# Patient Record
Sex: Male | Born: 1955 | Race: White | Hispanic: No | Smoking: Former smoker
Health system: Southern US, Community
[De-identification: ages and names within clinical notes are randomized; demographics above are authoritative.]

## PROBLEM LIST (undated history)

## (undated) DIAGNOSIS — J449 Chronic obstructive pulmonary disease, unspecified: Secondary | ICD-10-CM

## (undated) DIAGNOSIS — I10 Essential (primary) hypertension: Secondary | ICD-10-CM

## (undated) DIAGNOSIS — E119 Type 2 diabetes mellitus without complications: Secondary | ICD-10-CM

## (undated) DIAGNOSIS — M1990 Unspecified osteoarthritis, unspecified site: Secondary | ICD-10-CM

## (undated) DIAGNOSIS — C801 Malignant (primary) neoplasm, unspecified: Secondary | ICD-10-CM

## (undated) DIAGNOSIS — M21371 Foot drop, right foot: Secondary | ICD-10-CM

## (undated) HISTORY — PX: HERNIA REPAIR: SHX51

## (undated) HISTORY — PX: OTHER SURGICAL HISTORY: SHX169

## (undated) HISTORY — PX: LUNG REMOVAL, PARTIAL: SHX233

## (undated) HISTORY — DX: Chronic obstructive pulmonary disease, unspecified: J44.9

## (undated) HISTORY — PX: EYE SURGERY: SHX253

## (undated) HISTORY — DX: Essential (primary) hypertension: I10

## (undated) HISTORY — PX: CATARACT EXTRACTION: SUR2

---

## 2003-04-21 ENCOUNTER — Ambulatory Visit (HOSPITAL_COMMUNITY): Admission: RE | Admit: 2003-04-21 | Discharge: 2003-04-22 | Payer: Self-pay | Admitting: Neurosurgery

## 2009-10-10 HISTORY — PX: COLONOSCOPY: SHX174

## 2009-10-15 ENCOUNTER — Encounter: Payer: Self-pay | Admitting: Gastroenterology

## 2009-10-18 ENCOUNTER — Ambulatory Visit: Payer: Self-pay | Admitting: Gastroenterology

## 2009-10-18 ENCOUNTER — Ambulatory Visit (HOSPITAL_COMMUNITY): Admission: RE | Admit: 2009-10-18 | Discharge: 2009-10-18 | Payer: Self-pay | Admitting: Gastroenterology

## 2009-10-20 ENCOUNTER — Encounter (INDEPENDENT_AMBULATORY_CARE_PROVIDER_SITE_OTHER): Payer: Self-pay

## 2009-11-03 ENCOUNTER — Observation Stay (HOSPITAL_COMMUNITY): Admission: RE | Admit: 2009-11-03 | Discharge: 2009-11-04 | Payer: Self-pay | Admitting: General Surgery

## 2009-11-03 ENCOUNTER — Encounter (INDEPENDENT_AMBULATORY_CARE_PROVIDER_SITE_OTHER): Payer: Self-pay | Admitting: General Surgery

## 2010-03-09 ENCOUNTER — Emergency Department (HOSPITAL_COMMUNITY): Admission: EM | Admit: 2010-03-09 | Discharge: 2010-03-09 | Payer: Self-pay | Admitting: Emergency Medicine

## 2010-07-12 NOTE — Letter (Signed)
Summary: Patient Notice, Colon Biopsy Results  [facility]  [address]   [place], [place] [address]   Phone: [phone]  Fax: [phone]       Oct 20, 2009   JEFFREY Coote [address] [place], [place]  [address] 1955-10-29    Dear Mr. Kille,  I am pleased to inform you that the biopsies taken during your recent colonoscopy did not show any evidence of cancer upon pathologic examination.  Additional information/recommendations:  __ Please follow a high fiber diet  __You should have a repeat colonoscopy examination  in 10 years.  Please call us if you are having persistent problems or have questions about your condition that have not been fully answered at this time.  Sincerely,    Hendricks Limes LPN  [facility] Ph: [phone]    Fax: [phone]   Appended Document: Patient Notice, Colon Biopsy Results Pt called and was informed of his results.

## 2010-07-12 NOTE — Letter (Signed)
Summary: TCS ORDER  TCS ORDER   Imported By: Ave Filter 10/15/2009 10:33:12  _____________________________________________________________________  External Attachment:    Type:   Image     Comment:   External Document

## 2010-08-29 LAB — BASIC METABOLIC PANEL
BUN: 11 mg/dL (ref 6–23)
CO2: 24 mEq/L (ref 19–32)
Calcium: 9.3 mg/dL (ref 8.4–10.5)
Chloride: 102 mEq/L (ref 96–112)
Creatinine, Ser: 0.71 mg/dL (ref 0.4–1.5)
GFR calc Af Amer: >60 mL/min (ref >60)
GFR calc non Af Amer: >60 mL/min (ref >60)
Glucose, Bld: 86 mg/dL (ref 70–99)
Potassium: 4.3 mEq/L (ref 3.5–5.1)
Sodium: 135 mEq/L (ref 135–145)

## 2010-08-29 LAB — DIFFERENTIAL
Basophils Absolute: 0 10*3/uL (ref 0.0–0.1)
Basophils Relative: 0 % (ref 0–1)
Eosinophils Absolute: 0.3 10*3/uL (ref 0.0–0.7)
Eosinophils Relative: 3 % (ref 0–5)
Lymphocytes Relative: 38 % (ref 12–46)
Lymphs Abs: 4.1 10*3/uL — ABNORMAL HIGH (ref 0.7–4.0)
Monocytes Absolute: 0.6 10*3/uL (ref 0.1–1.0)
Monocytes Relative: 6 % (ref 3–12)
Neutro Abs: 5.7 10*3/uL (ref 1.7–7.7)
Neutrophils Relative %: 53 % (ref 43–77)

## 2010-08-29 LAB — CBC
HCT: 43 % (ref 39.0–52.0)
Hemoglobin: 14.9 g/dL (ref 13.0–17.0)
MCHC: 34.5 g/dL (ref 30.0–36.0)
MCV: 85.9 fL (ref 78.0–100.0)
Platelets: 248 10*3/uL (ref 150–400)
RBC: 5.01 MIL/uL (ref 4.22–5.81)
RDW: 14.4 % (ref 11.5–15.5)
WBC: 10.7 10*3/uL — ABNORMAL HIGH (ref 4.0–10.5)

## 2010-08-29 LAB — MRSA PCR SCREENING: MRSA by PCR: NEGATIVE

## 2010-10-28 NOTE — Op Note (Signed)
Stephen Diaz, Stephen Diaz                       ACCOUNT NO.:  [id number]   MEDICAL RECORD NO.:  [id number]                   PATIENT TYPE:  OIB   LOCATION:  2875                                 FACILITY:  [facility]   PHYSICIAN:  Hilda Lias, M.D.                DATE OF BIRTH:  Apr 18, 1956   DATE OF PROCEDURE:  04/21/2003  DATE OF DISCHARGE:                                 OPERATIVE REPORT   PREOPERATIVE DIAGNOSIS:  1. 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  2. Right carpal tunnel syndrome.   POSTOPERATIVE DIAGNOSIS:  1. 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  2. Right carpal tunnel syndrome.   OPERATION PERFORMED:  Anterior 5-6 diskectomy, removal of seven large free  fragments going to the right side, decompression of the spinal cord,  bilateral foraminotomy, interbody fusion plate.  Microscope.   SURGEON:  Hilda Lias, M.D.   ASSISTANT:  Clydene Fake, M.D.   ANESTHESIA:  General.   INDICATIONS FOR PROCEDURE:  The patient was admitted because of neck and  right upper extremity pain.  We found that he has a large herniated disk  central to the right of the level 5-6 as well as carpal tunnel syndrome.  Surgery was advised.  The risks were explained in the history and physical.   DESCRIPTION OF PROCEDURE:  The patient was taken to the operating room.  After intubation the neck was prepped with Betadine.  A transverse incision  was made through the skin and platysma down to the cervical spine.  X-ray  showed that indeed, we were at the level of 5-6.  With the microscope, we  opened the anterior ligament and we then performed gross diskectomy.  There  was an opening in the posterior ligament.  The posterior ligament was  incised and there were between seven to eight free fragments going to the  right side.  Decompression of the C6 nerve root was achieved. The nerve was  swollen and reddish.  Then we went  laterally to decompress the spinal cord  as well as the opposite nerve root.  Having good decompression, the end  plate was drilled and a piece of bone graft, allograft, 8 mm height was  inserted.  In the middle of the bone graft we put DBX.  Then the area was  irrigated.  A plate using four screws was done.  Lateral  C-spine showed  good position of the plate as well as the screws.  Investigation of the area  was negative.  Then hemostasis was down with bipolar and the wound was  closed with Vicryl and Steri-Strips.                                               Hilda Lias,  M.D.    EB/MEDQ  D:  04/21/2003  T:  04/21/2003  Job:  [id number]

## 2010-10-28 NOTE — H&P (Signed)
NAME:  Stephen Diaz, Stephen Diaz                       ACCOUNT NO.:  [id number]   MEDICAL RECORD NO.:  [id number]                   PATIENT TYPE:  OIB   LOCATION:  3011                                 FACILITY:  [facility]   PHYSICIAN:  Hilda Lias, M.D.                DATE OF BIRTH:  [birth year]   DATE OF ADMISSION:  04/21/2003  DATE OF DISCHARGE:                                HISTORY & PHYSICAL   HISTORY OF PRESENT ILLNESS:  Stephen Diaz is Diaz gentleman who came to see me  in my office several days ago because of sudden onset of neck pain with  radiation to the right shoulder associated with weakness and tingling  sensation downgoing to the right hand.  The patient has been unable to work,  he cannot sleep, and he is quite miserable.  He had MRI and sent to [place] for  evaluation.  He denies any problem with the left arm.  In the past he has  had some back pain with some spondylosis at the level of L3-L4 in the right  side.  Because of worsening of the clinical situation he wants to proceed  with surgery as soon as possible.   PAST MEDICAL HISTORY:  History of lumbar disk disease also at the level of  L1-2, 2-3, and 3-4.  Other levels unremarkable.   SOCIAL HISTORY:  The patient does not drink; he smokes daily.   PHYSICAL EXAMINATION:  GENERAL:  The patient came to my office with his wife  and he has quite Diaz bit of distress in the right upper extremity.  He has Diaz  tendency to put the hand on top of the head to relieve the pain.  HEENT:  Normal.  NECK:  He is able to flex but extension and lateralization produces  discomfort.  LUNGS:  There are some bilateral rhonchi.  CARDIOVASCULAR:  Normal.  ABDOMEN:  Normal.  EXTREMITIES:  Normal pulses.  NEUROLOGIC:  Mental status normal.  Cranial nerves normal.  Strength 5/5  except in the right arm where I can break easily the right biceps and right  wrist extensor.  He also has Diaz Tinel's sign positive right side.  Coordination and gait normal.   Reflexes 2+ with decrease of the right  biceps.   LABORATORY DATA:  The MRI showed that he has Diaz herniated disk at the level 5-  6 central and to the right.   CLINICAL IMPRESSION:  1. C5-6 herniated disk.  2. Carpal tunnel syndrome, right.   PLAN:  The patient is being admitted for anterior cervical discectomy.  The  procedure will be to decompress the spinal cord and C6 nerve root, put Diaz  bone graft in between, and Diaz plate.  He knows about the risks such as  infection, CSF leak, worsening pain, paralysis, need of further surgery,  damage to the vocal cord, damage to the esophagus and vertebral artery, need  to replace the plate.                                               Hilda Lias, M.D.   EB/MEDQ  D:  04/21/2003  T:  04/21/2003  Job:  [id number]

## 2010-10-31 ENCOUNTER — Encounter: Payer: Self-pay | Admitting: Nurse Practitioner

## 2010-12-28 ENCOUNTER — Ambulatory Visit (HOSPITAL_COMMUNITY): Payer: Managed Care, Other (non HMO)

## 2010-12-28 ENCOUNTER — Ambulatory Visit (HOSPITAL_COMMUNITY)
Admission: RE | Admit: 2010-12-28 | Discharge: 2010-12-29 | Disposition: A | Discharge status: Home / Self Care | Payer: Managed Care, Other (non HMO) | Source: Ambulatory Visit | Attending: Orthopedic Surgery | Admitting: Orthopedic Surgery

## 2010-12-28 DIAGNOSIS — J4489 Other specified chronic obstructive pulmonary disease: Secondary | ICD-10-CM | POA: Insufficient documentation

## 2010-12-28 DIAGNOSIS — F17200 Nicotine dependence, unspecified, uncomplicated: Secondary | ICD-10-CM | POA: Insufficient documentation

## 2010-12-28 DIAGNOSIS — Z01810 Encounter for preprocedural cardiovascular examination: Secondary | ICD-10-CM | POA: Insufficient documentation

## 2010-12-28 DIAGNOSIS — Z01818 Encounter for other preprocedural examination: Secondary | ICD-10-CM | POA: Insufficient documentation

## 2010-12-28 DIAGNOSIS — M47812 Spondylosis without myelopathy or radiculopathy, cervical region: Secondary | ICD-10-CM | POA: Insufficient documentation

## 2010-12-28 DIAGNOSIS — J449 Chronic obstructive pulmonary disease, unspecified: Secondary | ICD-10-CM | POA: Insufficient documentation

## 2010-12-28 DIAGNOSIS — Z472 Encounter for removal of internal fixation device: Secondary | ICD-10-CM | POA: Insufficient documentation

## 2010-12-28 DIAGNOSIS — I10 Essential (primary) hypertension: Secondary | ICD-10-CM | POA: Insufficient documentation

## 2010-12-28 DIAGNOSIS — Z01812 Encounter for preprocedural laboratory examination: Secondary | ICD-10-CM | POA: Insufficient documentation

## 2010-12-28 LAB — BASIC METABOLIC PANEL
BUN: 9 mg/dL (ref 6–23)
CO2: 26 mEq/L (ref 19–32)
Calcium: 9 mg/dL (ref 8.4–10.5)
Chloride: 107 mEq/L (ref 96–112)
Creatinine, Ser: 0.73 mg/dL (ref 0.50–1.35)
GFR calc Af Amer: >60 mL/min (ref >60)
GFR calc non Af Amer: >60 mL/min (ref >60)
Glucose, Bld: 97 mg/dL (ref 70–99)
Potassium: 4.7 mEq/L (ref 3.5–5.1)
Sodium: 141 mEq/L (ref 135–145)

## 2010-12-28 LAB — CBC
HCT: 42.6 % (ref 39.0–52.0)
Hemoglobin: 14.8 g/dL (ref 13.0–17.0)
MCH: 29.4 pg (ref 26.0–34.0)
MCHC: 34.7 g/dL (ref 30.0–36.0)
MCV: 84.7 fL (ref 78.0–100.0)
Platelets: 244 10*3/uL (ref 150–400)
RBC: 5.03 MIL/uL (ref 4.22–5.81)
RDW: 13.8 % (ref 11.5–15.5)
WBC: 8.3 10*3/uL (ref 4.0–10.5)

## 2010-12-28 LAB — SURGICAL PCR SCREEN
MRSA, PCR: NEGATIVE
Staphylococcus aureus: NEGATIVE

## 2010-12-28 IMAGING — RF DG CERVICAL SPINE 2 OR 3 VIEWS
1 series · 2 of 2 positions shown · non-contrast
Comparison: [DATE]

CLINICAL DATA: Neck pain

CERVICAL SPINE - 2-3 VIEW

[Series 1: run · 2 of 2 slices shown]
[im 1/2]
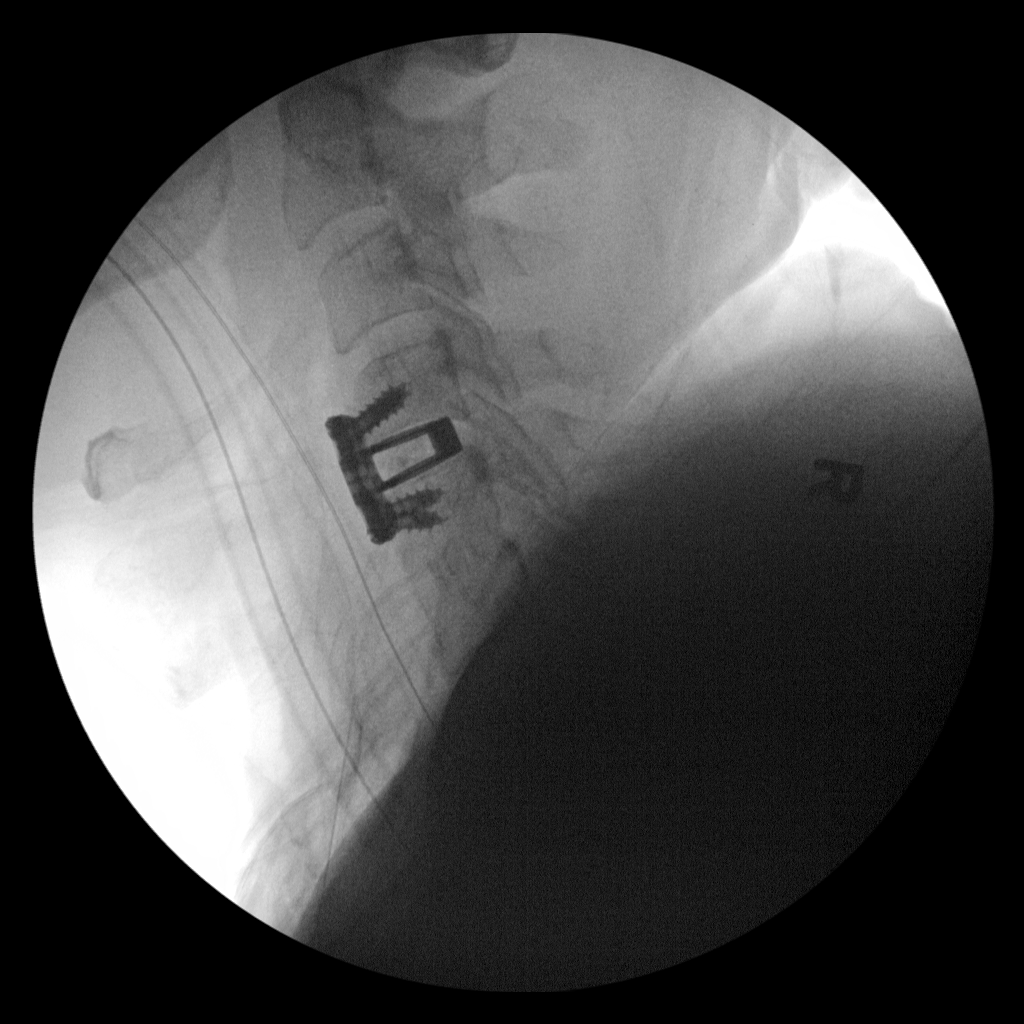
[im 2/2]
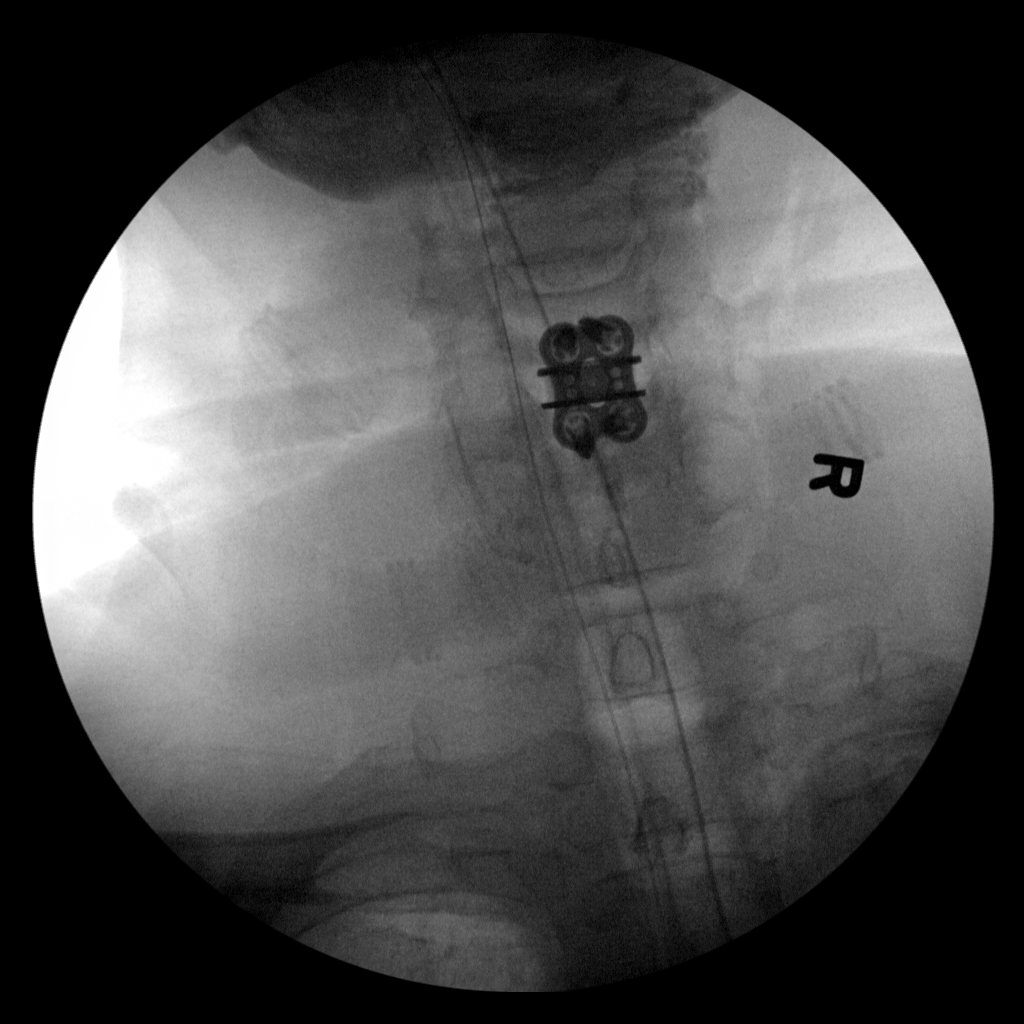

[2 of 2 positions shown; findings below may reference images not displayed]

FINDINGS: Hardware at C5-6 has been removed.  There is a new ACDF
at C4-C5.  Satisfactory position and alignment, with good
restoration of intervertebral disc space height.
IMPRESSION: Satisfactory appearance status post C4-C5 ACDF with removal of C5-
C6 hardware.

## 2010-12-28 IMAGING — CR DG CERVICAL SPINE 2 OR 3 VIEWS
3 series · 3 of 3 positions shown · non-contrast
Comparison: None.

CLINICAL DATA: Preop ACDF

CERVICAL SPINE - 2-3 VIEW

[view not recorded (1 of 3)]
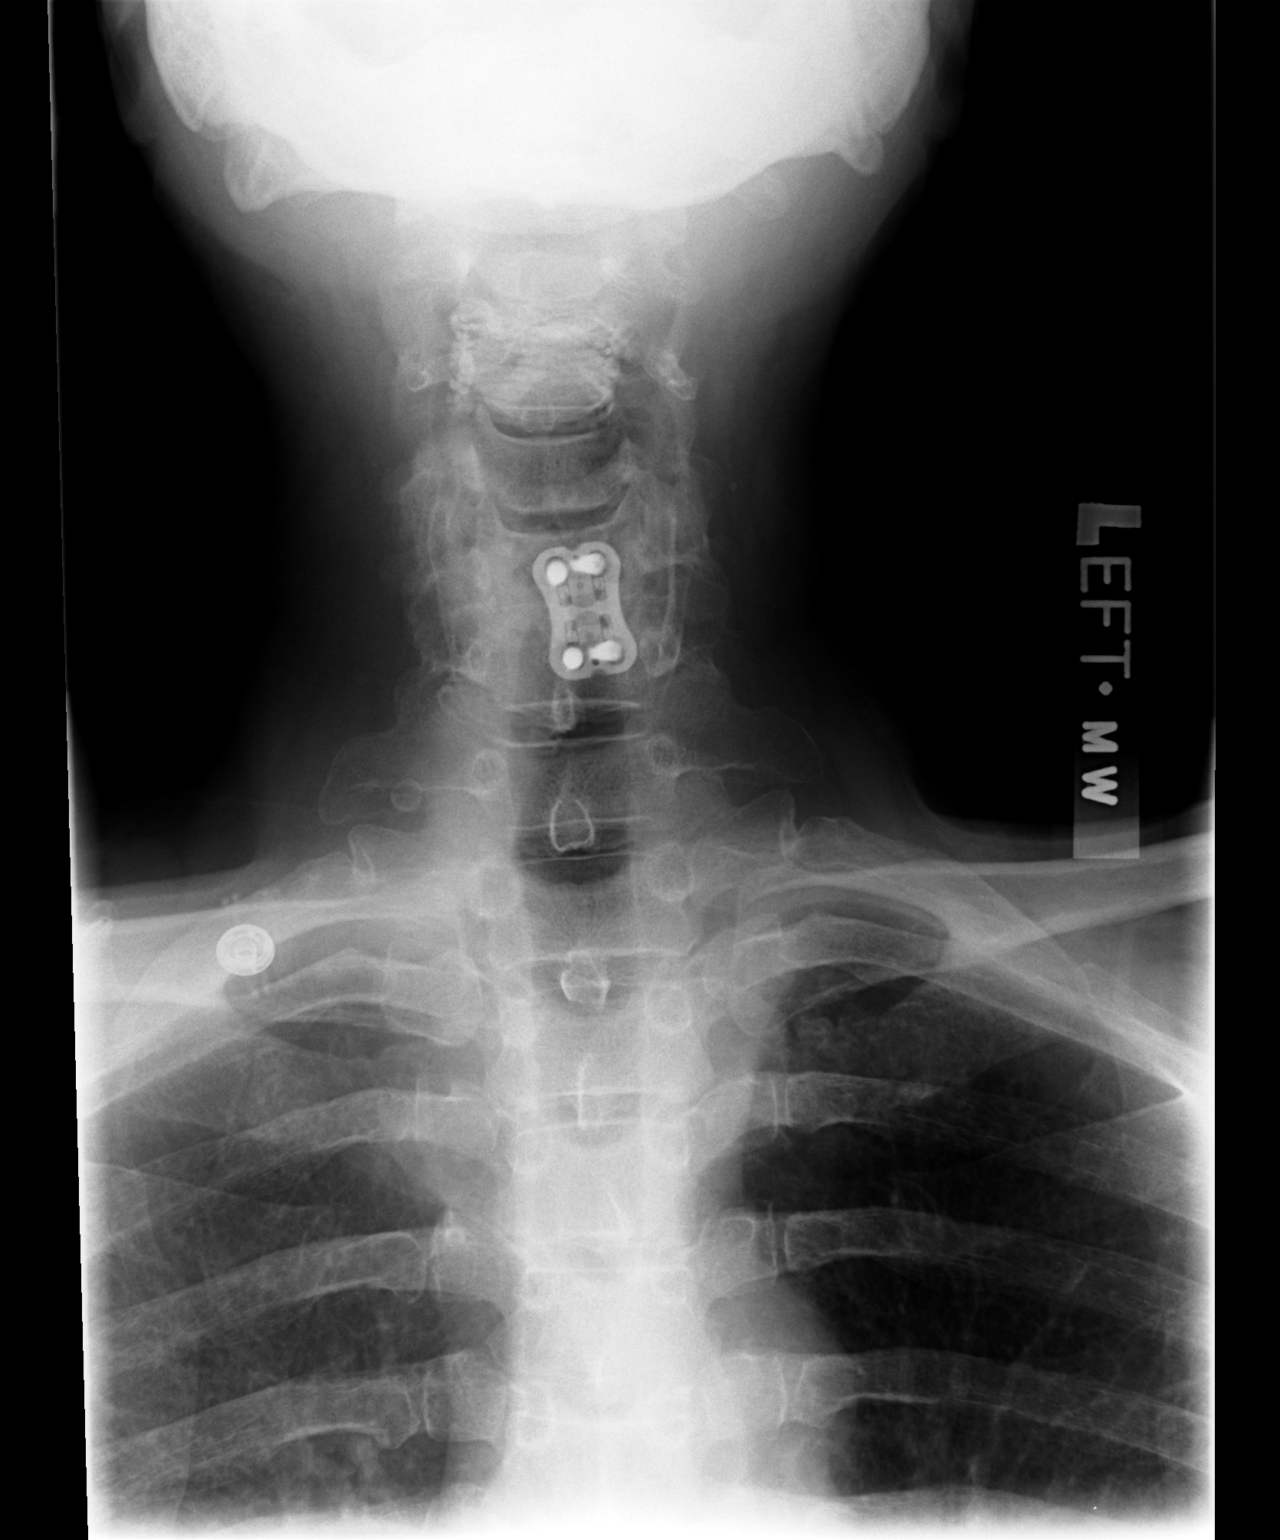

[view not recorded (2 of 3)]
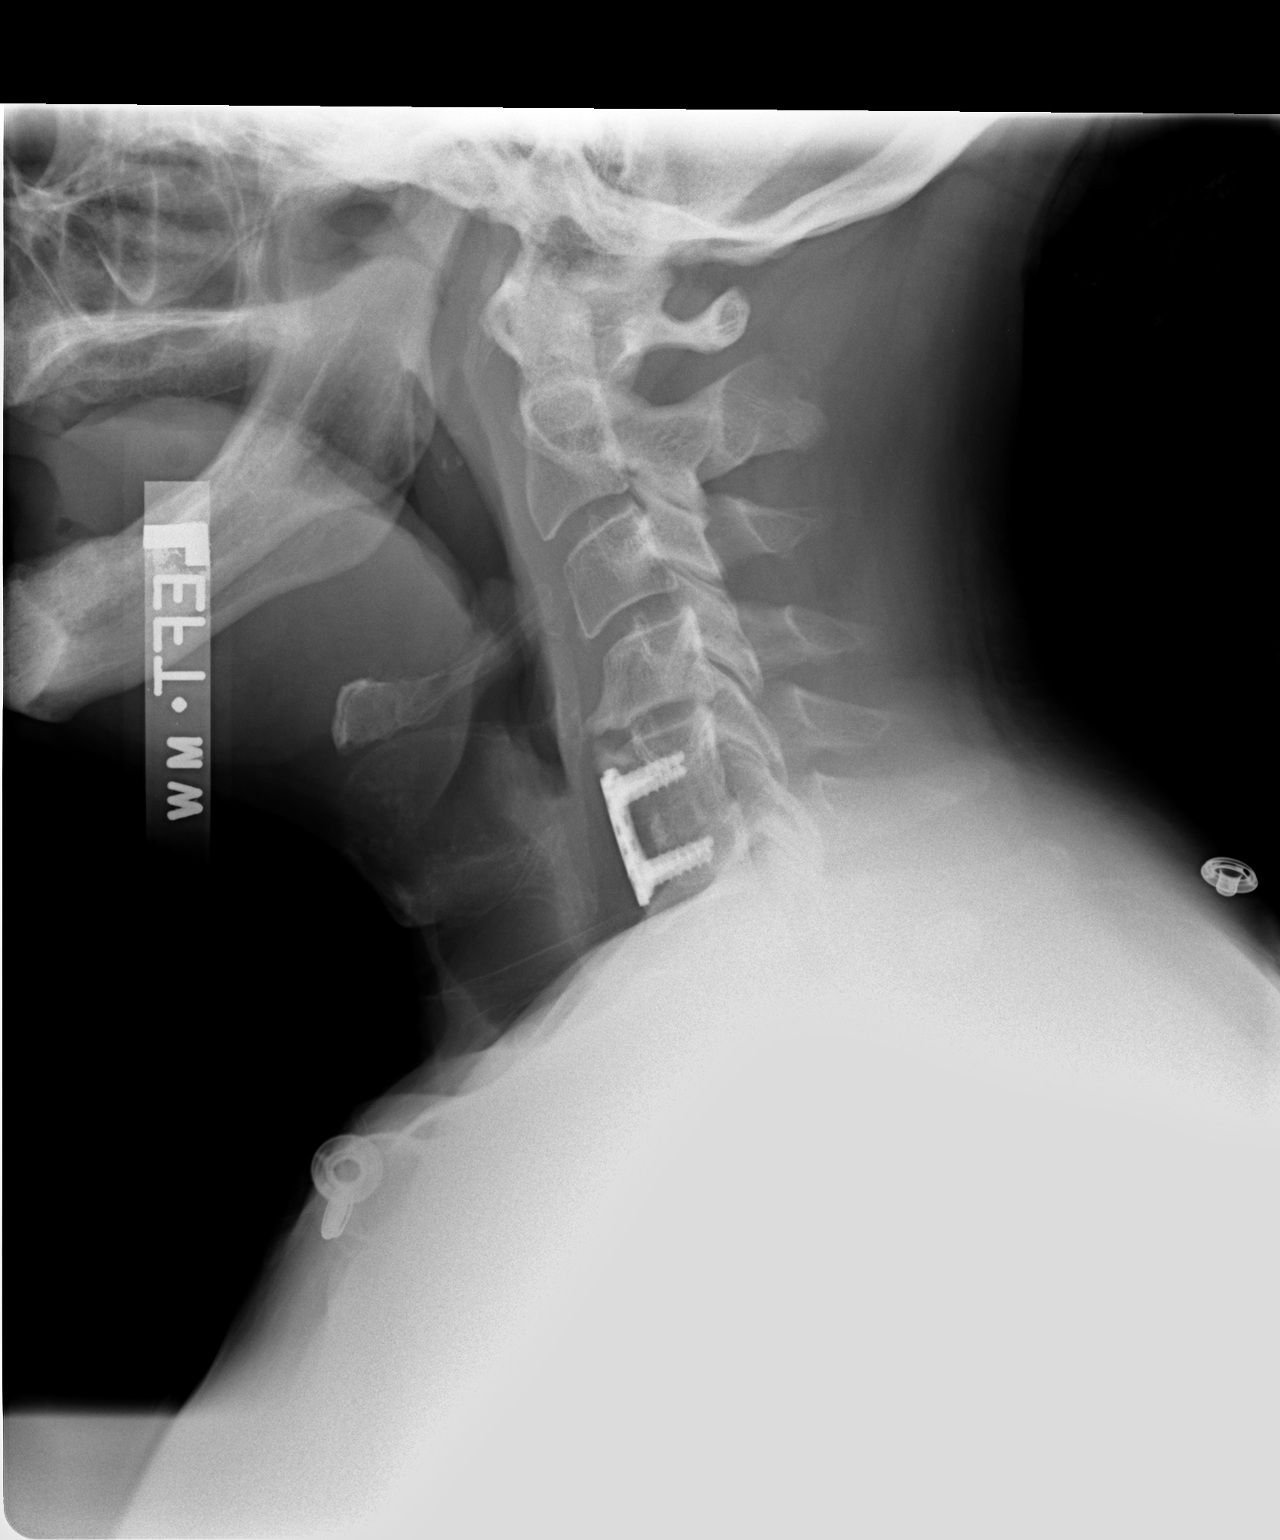

[view not recorded (3 of 3)]
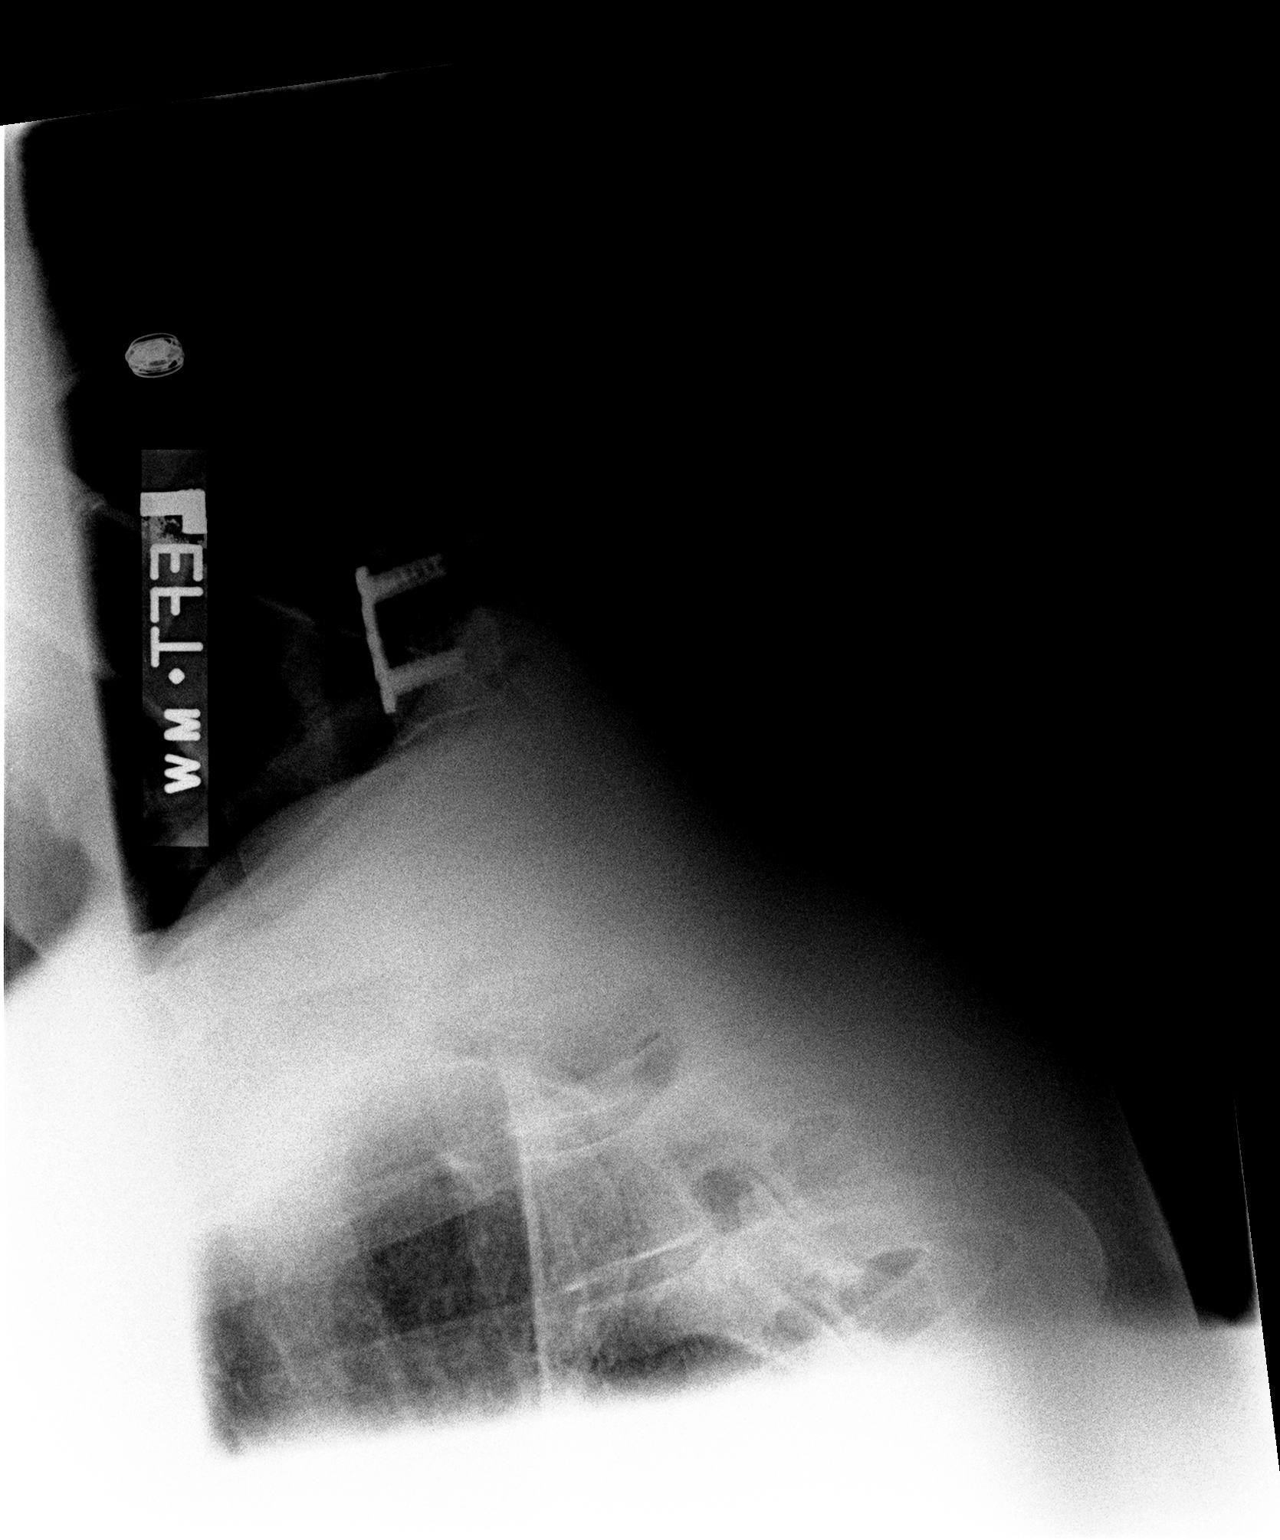

[3 of 3 positions shown; findings below may reference images not displayed]

FINDINGS: The cervical spine is visualized to the bottom of C6 on
the lateral view.

Straightening of the cervical spine.

No fracture or dislocation is seen.

Degenerative changes of the mid cervical spine.

Prior anterior cervical fixation at C5-6.  No evidence of hardware
complication.
IMPRESSION: Prior anterior cervical fixation at C5-6.  No evidence of hardware
complication.

## 2010-12-28 IMAGING — CR DG CERVICAL SPINE 2 OR 3 VIEWS
2 series · 2 of 2 positions shown · non-contrast
Comparison: Intraoperative films earlier today

CLINICAL DATA: Neck pain

CERVICAL SPINE - 2-3 VIEW

[AP]
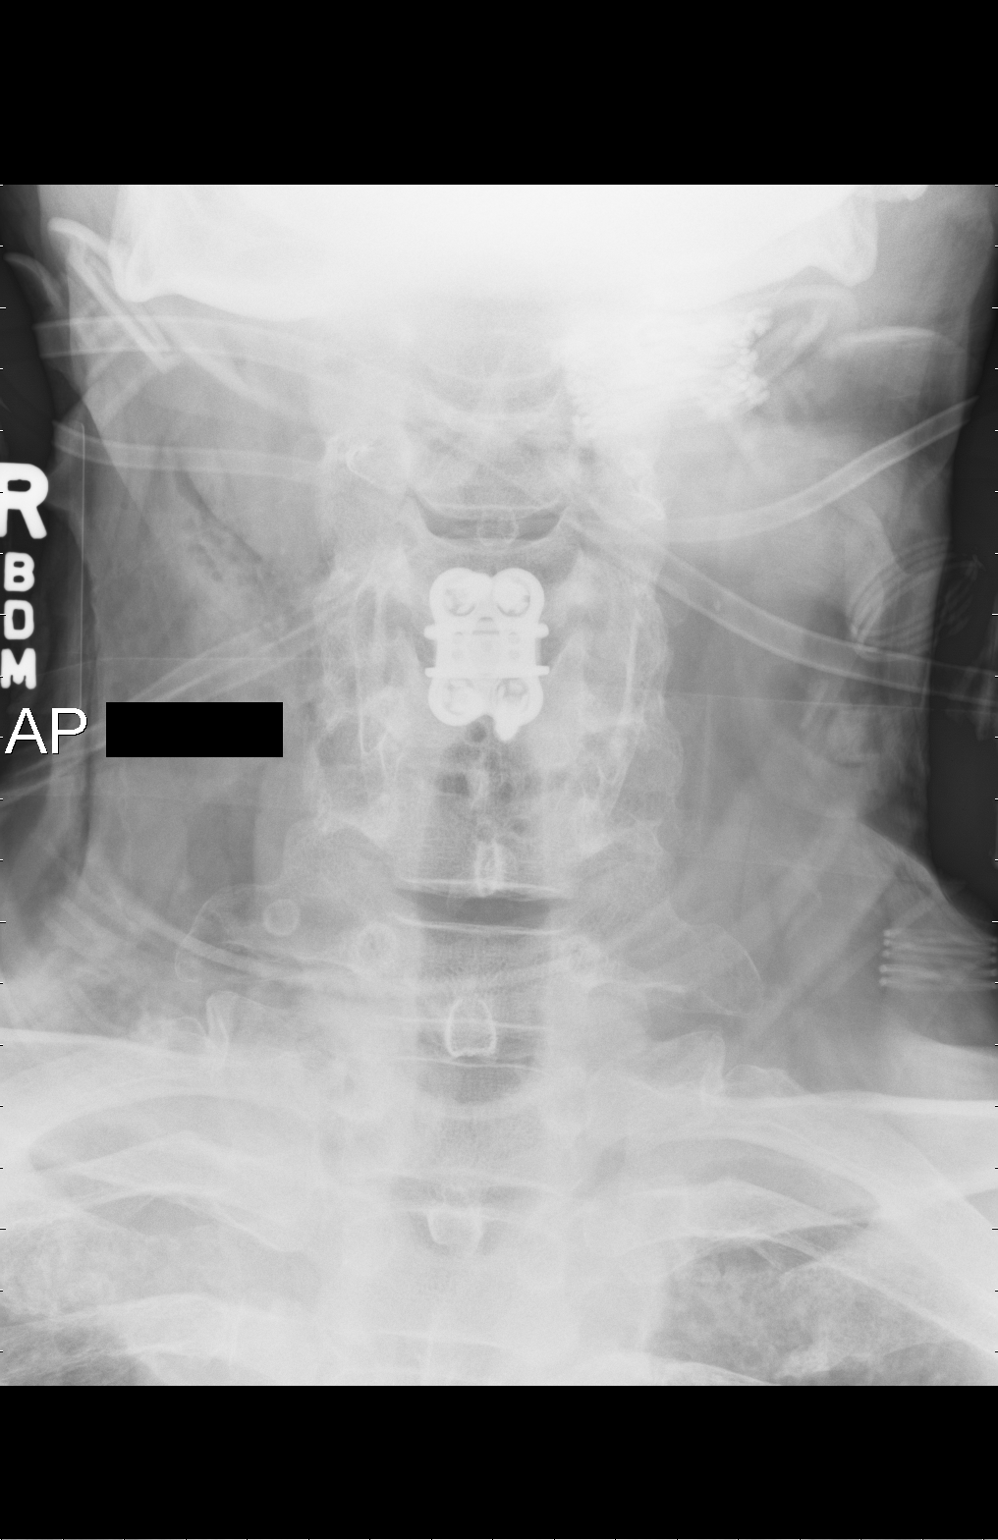

[c spine lat]
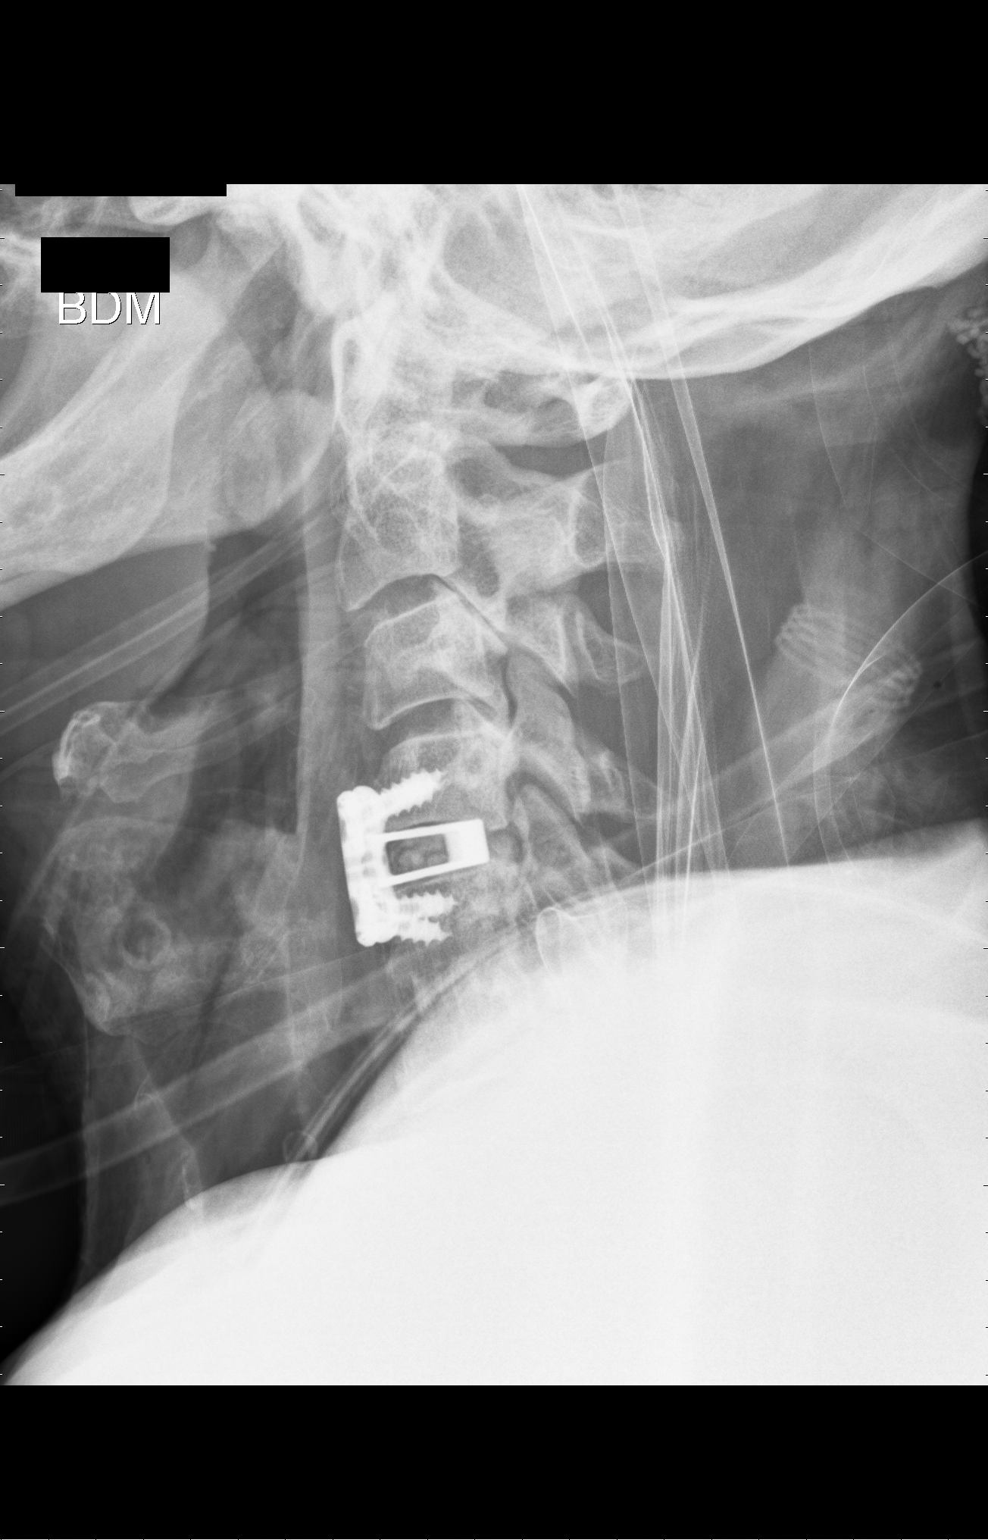

[2 of 2 positions shown; findings below may reference images not displayed]

FINDINGS: AP and lateral portable views demonstrate C4-5 ACDF.
Satisfactory position and alignment.
IMPRESSION: As above.

## 2010-12-28 IMAGING — CR DG CHEST 2V
3 series · 3 of 3 positions shown · non-contrast
Comparison: None.

CLINICAL DATA: Preop ACDF

CHEST - 2 VIEW

[view not recorded (1 of 3)]
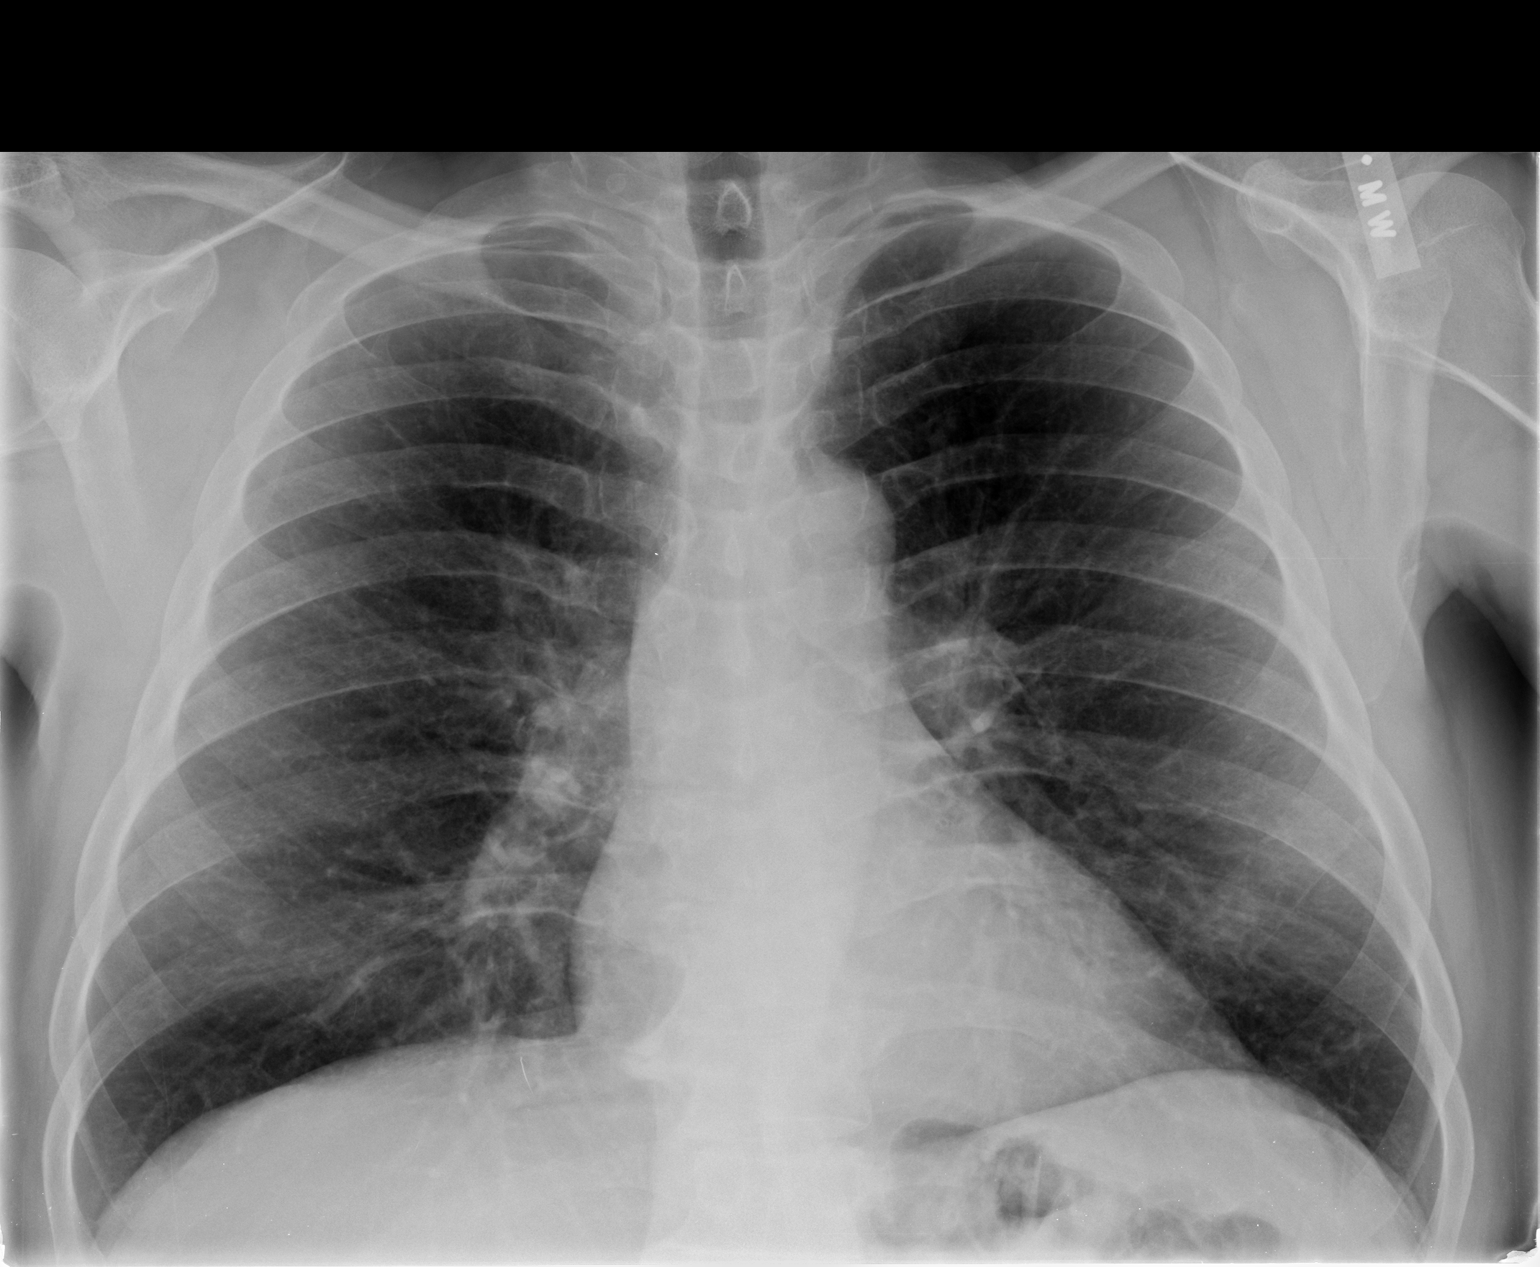

[view not recorded (2 of 3)]
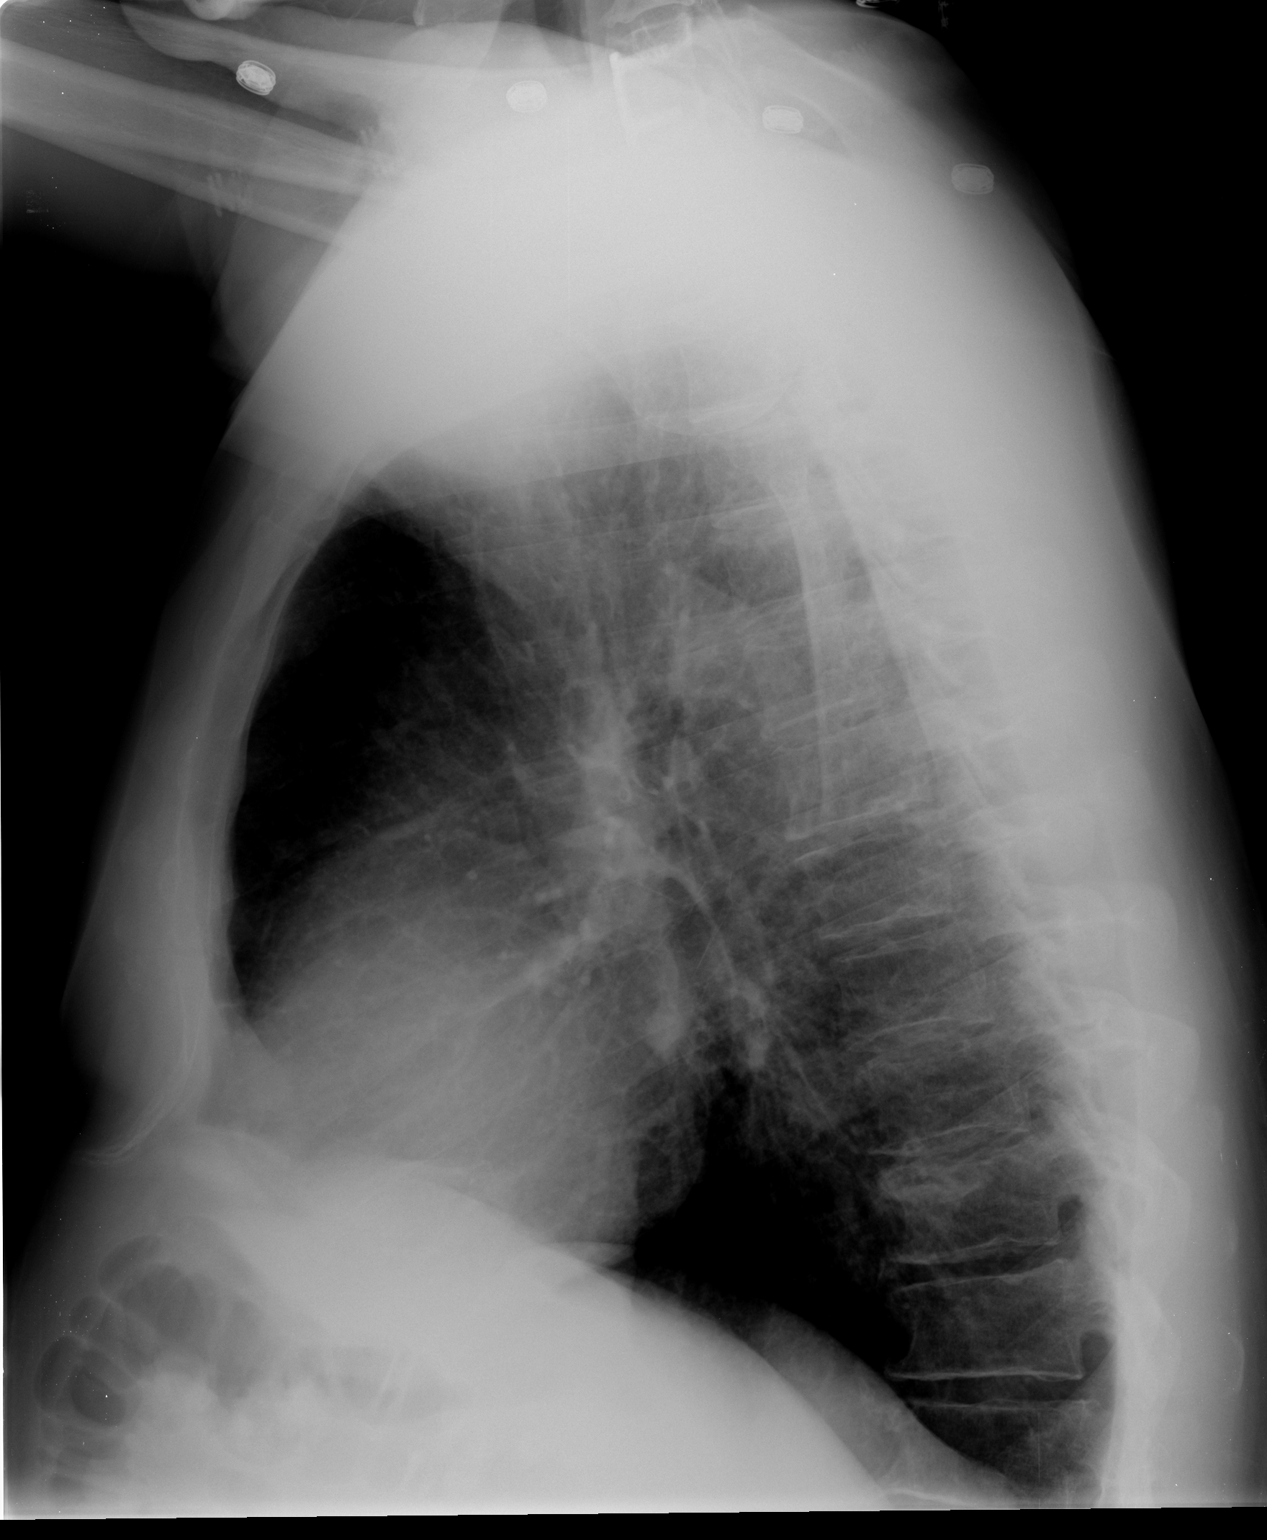

[view not recorded (3 of 3)]
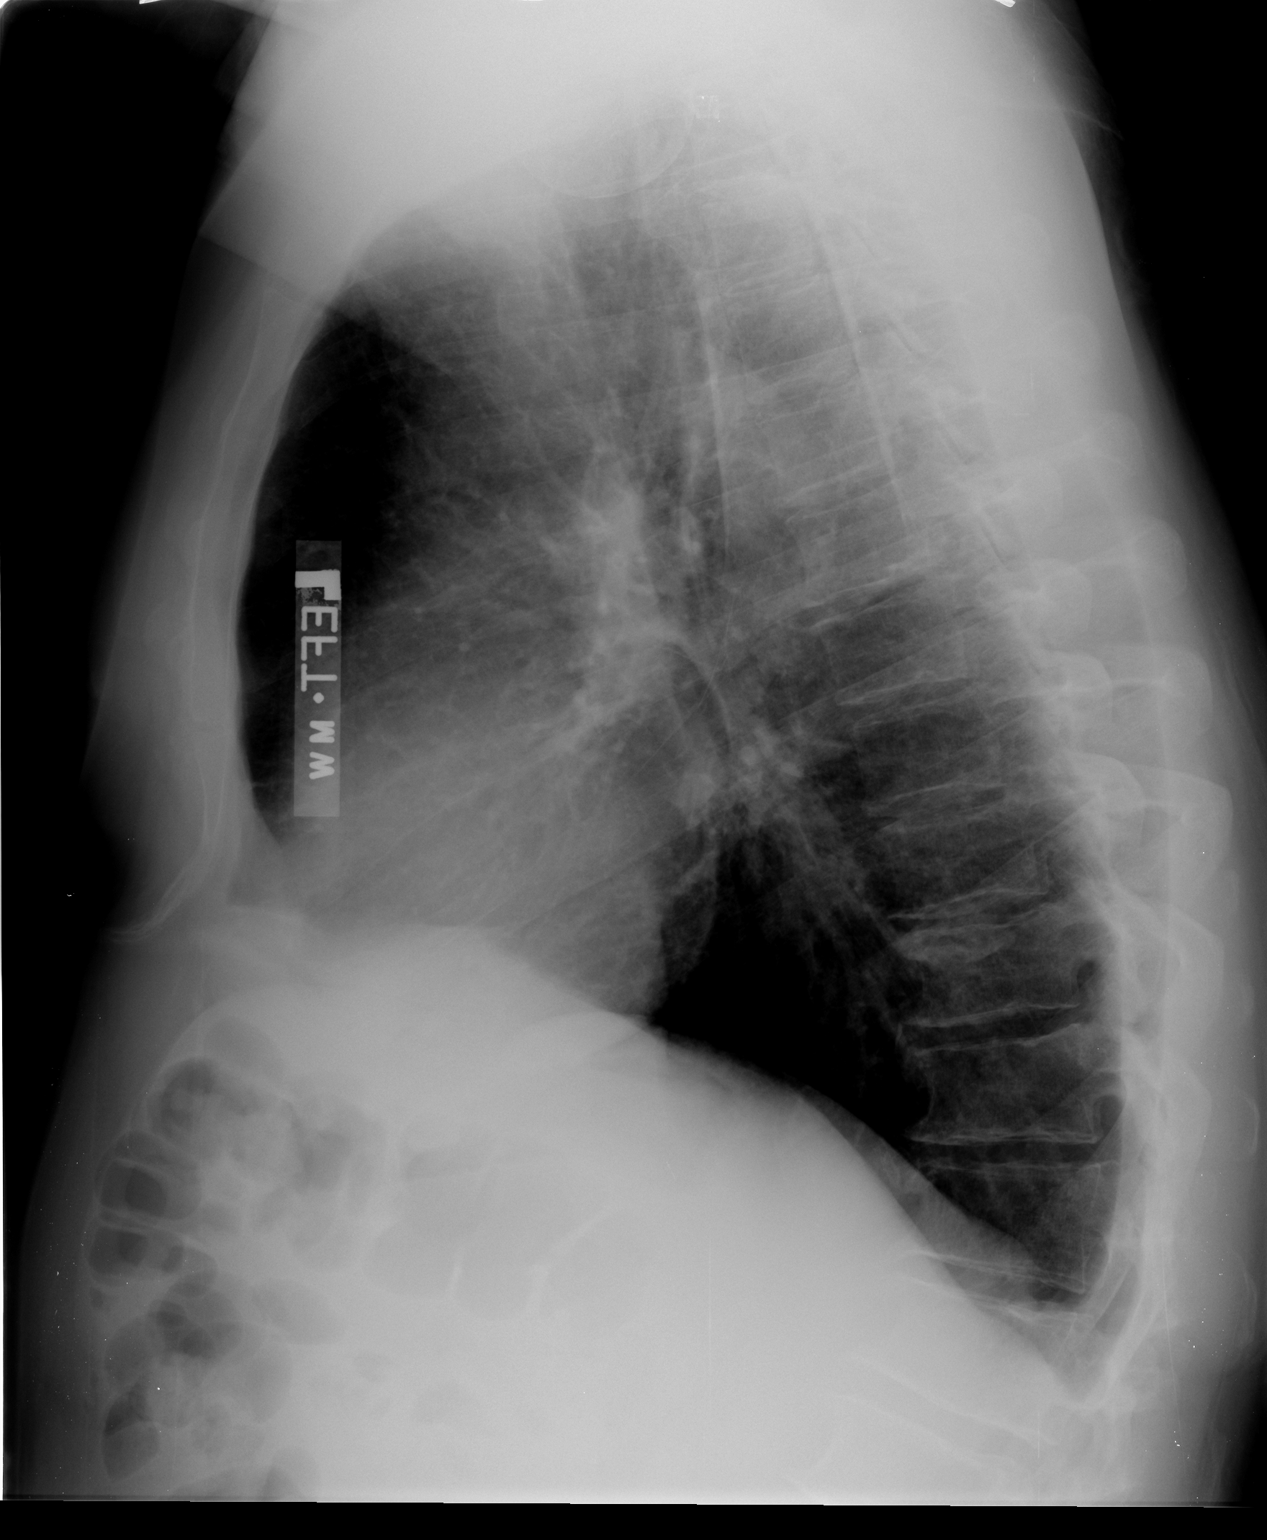

[3 of 3 positions shown; findings below may reference images not displayed]

FINDINGS: Lungs are clear. No pleural effusion or pneumothorax.

Cardiomediastinal silhouette is within normal limits.

Degenerative changes of the visualized thoracolumbar spine.  Prior
anterior cervical fusion.
IMPRESSION: No evidence of acute cardiopulmonary disease.

## 2011-01-12 NOTE — Op Note (Signed)
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  OPERATIVE PROCEDURES: 1. Anterior cervical diskectomy and fusion C4-5. 2. Removal of hardware anterior cervical spine C5-6. 3. Exploration of fusion C5-6.  COMPLICATIONS:  None.  CONDITION:  Stable.  FIRST ASSISTANT:  Norval Gable, PA  HISTORY:  This is a very pleasant 55 year old gentleman who underwent an ACDF at C5-6 several years ago and did well.  He started having increasing neck and severe radicular left arm pain.  X-rays demonstrated adjacent segment degenerative disease with disk herniation.  As a result of these findings and its failure to improve with conservative management, he elected proceed with revision surgery.  Preoperative ENT evaluation confirmed normal vocal cord motion.  As a result, we elected to use the contralateral side for the approach as it would be less scar tissue.  OPERATIVE NOTE:  The patient is brought to the operating room, placed supine on the operating table.  After successful induction of general anesthesia and endotracheal intubation, TED and SCDs were applied. Rolled towels were placed between the shoulder blades.  The shoulders themselves were taped down.  The anterior cervical spine was prepped and draped in the standard fashion for an anterior approach.  Appropriate time-out was done to confirm the patient's procedure, affected extremity, and all other pertinent important data.  Once this was completed, a longitudinal incision was made on the right side  of sternocleidomastoid.  Sharp dissection was carried out down to and through the platysma and then sharply dissected through the deep cervical fascia protecting the carotid sheath laterally with my finger and sweeping the trachea and esophagus medially and protecting with a finger.  Once I was down to the prevertebral and ultimately anterior longitudinal ligament, I exposed the previously placed C5-6 plate.  Once I had completely exposed, I released the locking mechanism and removed all four screws.  I then removed the plate.  At this point, I palpated and explored the fusion, and I noted it to be solid.  I then mobilized through the C4 vertebral body, removed the osteophytes between C4-5 and then placed self-retaining Caspar retractors.  I then placed distraction pins and divided C4-C5, distracted the 4-5 space and proceeded with the surgery.  Using a #15 blade scalpel, I performed an annulotomy.  Then using a combination of pituitary rongeurs, curettes, Kerrison rongeurs, I removed all the disk material.  I removed all the cartilaginous endplate to expose the bleeding subchondral bone at C4-C5.  I then removed the posterior osteophyte and the posterior longitudinal ligament.  Using a fine nerve hook, I developed a plane between the posterior longitudinal ligament and dura, and then used a 1-mm Kerrison to resect the posterior longitudinal ligament.  At this point, I had an adequate decompression.  I also  had excellent distraction with indirect foraminal decompression.  I placed an 8 large Titan titanium lordotic cage packed with DBX mix.  I had excellent fixation and purchase.  I then took a 14-mm anterior cervical Synthes vector plate and secured at C4 and C5 with 16-mm at 14-mm rescue screws.  All four screws got excellent purchase.  I then irrigated the wound copiously with normal saline, made sure I had hemostasis, returned the trachea and esophagus to midline after confirming that  they did not become entrapped beneath the plate.  I then closed the platysma with interrupted 2-0 Vicryl sutures, Monocryl for the skin.  At the end the case, all needle and sponge counts were correct.  The patient was transferred to PACU without incident.     Alvy Beal, MD     DDB/MEDQ  D:  12/28/2010  T:  12/29/2010  Job:  [id number]  Electronically Signed by Venita Lick MD on 01/12/2011 11:29:04 AM

## 2011-02-15 ENCOUNTER — Ambulatory Visit: Payer: Managed Care, Other (non HMO) | Attending: Orthopedic Surgery | Admitting: Physical Therapy

## 2011-02-15 DIAGNOSIS — IMO0001 Reserved for inherently not codable concepts without codable children: Secondary | ICD-10-CM | POA: Insufficient documentation

## 2011-02-15 DIAGNOSIS — M2560 Stiffness of unspecified joint, not elsewhere classified: Secondary | ICD-10-CM | POA: Insufficient documentation

## 2011-02-15 DIAGNOSIS — R5381 Other malaise: Secondary | ICD-10-CM | POA: Insufficient documentation

## 2011-02-16 ENCOUNTER — Ambulatory Visit: Payer: Managed Care, Other (non HMO) | Admitting: Physical Therapy

## 2011-02-20 ENCOUNTER — Ambulatory Visit: Payer: Managed Care, Other (non HMO) | Admitting: Physical Therapy

## 2011-02-22 ENCOUNTER — Ambulatory Visit: Payer: Managed Care, Other (non HMO) | Admitting: Physical Therapy

## 2011-02-23 ENCOUNTER — Encounter: Payer: Managed Care, Other (non HMO) | Admitting: Physical Therapy

## 2011-02-27 ENCOUNTER — Ambulatory Visit: Payer: Managed Care, Other (non HMO) | Admitting: Physical Therapy

## 2011-03-01 ENCOUNTER — Ambulatory Visit: Payer: Managed Care, Other (non HMO) | Admitting: Physical Therapy

## 2011-03-06 ENCOUNTER — Ambulatory Visit: Payer: Managed Care, Other (non HMO) | Admitting: Physical Therapy

## 2011-03-08 ENCOUNTER — Ambulatory Visit: Payer: Managed Care, Other (non HMO) | Admitting: Physical Therapy

## 2011-03-13 ENCOUNTER — Ambulatory Visit: Payer: Managed Care, Other (non HMO) | Attending: Orthopedic Surgery | Admitting: Physical Therapy

## 2011-03-13 DIAGNOSIS — IMO0001 Reserved for inherently not codable concepts without codable children: Secondary | ICD-10-CM | POA: Insufficient documentation

## 2011-03-13 DIAGNOSIS — R5381 Other malaise: Secondary | ICD-10-CM | POA: Insufficient documentation

## 2011-03-13 DIAGNOSIS — M2560 Stiffness of unspecified joint, not elsewhere classified: Secondary | ICD-10-CM | POA: Insufficient documentation

## 2011-03-15 ENCOUNTER — Ambulatory Visit: Payer: Managed Care, Other (non HMO) | Admitting: Physical Therapy

## 2011-03-20 ENCOUNTER — Ambulatory Visit: Payer: Managed Care, Other (non HMO) | Admitting: Physical Therapy

## 2011-03-22 ENCOUNTER — Encounter: Payer: Managed Care, Other (non HMO) | Admitting: Physical Therapy

## 2016-10-17 DIAGNOSIS — I10 Essential (primary) hypertension: Secondary | ICD-10-CM | POA: Diagnosis present

## 2016-10-17 DIAGNOSIS — J449 Chronic obstructive pulmonary disease, unspecified: Secondary | ICD-10-CM | POA: Insufficient documentation

## 2018-01-29 DIAGNOSIS — E785 Hyperlipidemia, unspecified: Secondary | ICD-10-CM | POA: Insufficient documentation

## 2019-09-23 ENCOUNTER — Encounter: Payer: Self-pay | Admitting: Gastroenterology

## 2020-06-24 ENCOUNTER — Ambulatory Visit: Payer: Medicare Other | Admitting: Neurology

## 2020-07-14 ENCOUNTER — Other Ambulatory Visit (HOSPITAL_COMMUNITY): Payer: Managed Care, Other (non HMO)

## 2020-08-09 NOTE — H&P (Signed)
Surgical History & Physical  Patient Name: Celia Gibbons DOB: 02/11/1956  Surgery: Cataract extraction with intraocular lens implant phacoemulsification; Right Eye  Surgeon: Baruch Goldmann MD Surgery Date:  08/13/2020 Pre-Op Date:  08/09/2020  HPI: A 2 Yr. old male patient is referred by Dr Hassell Done for cataract eval. 1. The patient complains of nighttime light - car headlights, street lamps etc. glare causing poor vision, which began many years ago. The right eye is affected. The episode is gradual. The condition's severity increased since last visit. Symptoms occur when the patient is driving and outside.T his is negatively affecting his quality of life. HPI Completed by Dr. Baruch Goldmann  Medical History: Cataracts Diabetes High Blood Pressure Lung Problems  Review of Systems Negative Allergic/Immunologic Negative Cardiovascular Negative Constitutional Negative Ear, Nose, Mouth & Throat Negative Endocrine Negative Eyes Negative Gastrointestinal Negative Genitourinary Negative Hemotologic/Lymphatic Negative Integumentary Negative Musculoskeletal Negative Neurological Negative Psychiatry Negative Respiratory  Social   Current every day smoker 1 pack Per Day  Medication Metformin, Lisinopril, Albuterol, Flovent HFA,   Sx/Procedures Cataract Surgery,  Neck Hernia,   Drug Allergies   NKDA  History & Physical: Heent:  Cataract, Right eye NECK: supple without bruits LUNGS: lungs clear to auscultation CV: regular rate and rhythm Abdomen: soft and non-tender  Impression & Plan: Assessment: 1.  COMBINED FORMS AGE RELATED CATARACT; Right Eye (H25.811) 2.  BLEPHARITIS; Right Upper Lid, Right Lower Lid, Left Upper Lid, Left Lower Lid (H01.001, H01.002,H01.004,H01.005) 3.  Pinguecula; Both Eyes (H11.153) 4.  PCO; Left Eye [id number])  Plan: 1.  Cataract accounts for the patient's decreased vision. This visual impairment is not correctable with a tolerable change in  glasses or contact lenses. Cataract surgery with an implantation of a new lens should significantly improve the visual and functional status of the patient. Discussed all risks, benefits, alternatives, and potential complications. Discussed the procedures and recovery. Patient desires to have surgery. A-scan ordered and performed today for intra-ocular lens calculations. The surgery will be performed in order to improve vision for driving, reading, and for eye examinations. Recommend phacoemulsification with intra-ocular lens. Recommend Dextenza for post-operative pain and inflammation. Dilates poorly - shugacaine by protocol. Malyugin Ring. Omidira. Right Eye. Surgery required to correct imbalance of vision. 2.  recommend regular lid cleaning. 3.  Observe; Artificial tears as needed for irritation. 4.  Mild. Given history of inflammation an steroid injections, it appears that outcome is good right now. Do not want to do any laser or other procedure and possibly cause recurrent inflammation. Can address in future if vision worsens.

## 2020-08-11 ENCOUNTER — Other Ambulatory Visit: Payer: Self-pay

## 2020-08-11 ENCOUNTER — Other Ambulatory Visit (HOSPITAL_COMMUNITY)
Admission: RE | Admit: 2020-08-11 | Discharge: 2020-08-11 | Disposition: A | Discharge status: Home / Self Care | Payer: Medicare Other | Source: Ambulatory Visit | Attending: Ophthalmology | Admitting: Ophthalmology

## 2020-08-11 ENCOUNTER — Encounter (HOSPITAL_COMMUNITY)
Admission: RE | Admit: 2020-08-11 | Discharge: 2020-08-11 | Disposition: A | Discharge status: Home / Self Care | Payer: Medicare Other | Source: Ambulatory Visit | Attending: Ophthalmology | Admitting: Ophthalmology

## 2020-08-11 ENCOUNTER — Encounter (HOSPITAL_COMMUNITY): Payer: Self-pay

## 2020-08-11 DIAGNOSIS — Z01812 Encounter for preprocedural laboratory examination: Secondary | ICD-10-CM | POA: Insufficient documentation

## 2020-08-11 DIAGNOSIS — Z20822 Contact with and (suspected) exposure to covid-19: Secondary | ICD-10-CM | POA: Diagnosis not present

## 2020-08-11 HISTORY — DX: Type 2 diabetes mellitus without complications: E11.9

## 2020-08-11 LAB — SARS CORONAVIRUS 2 (TAT 6-24 HRS): SARS Coronavirus 2: NEGATIVE

## 2020-08-13 ENCOUNTER — Ambulatory Visit (HOSPITAL_COMMUNITY): Payer: Medicare Other | Admitting: Anesthesiology

## 2020-08-13 ENCOUNTER — Other Ambulatory Visit: Payer: Self-pay

## 2020-08-13 ENCOUNTER — Encounter (HOSPITAL_COMMUNITY): Payer: Self-pay | Admitting: Ophthalmology

## 2020-08-13 ENCOUNTER — Ambulatory Visit (HOSPITAL_COMMUNITY)
Admission: RE | Admit: 2020-08-13 | Discharge: 2020-08-13 | Disposition: A | Discharge status: Home / Self Care | Payer: Medicare Other | Attending: Ophthalmology | Admitting: Ophthalmology

## 2020-08-13 ENCOUNTER — Encounter (HOSPITAL_COMMUNITY)
Admission: RE | Disposition: A | Discharge status: Home / Self Care | Payer: Self-pay | Source: Home / Self Care | Attending: Ophthalmology

## 2020-08-13 DIAGNOSIS — F17210 Nicotine dependence, cigarettes, uncomplicated: Secondary | ICD-10-CM | POA: Insufficient documentation

## 2020-08-13 DIAGNOSIS — H0100B Unspecified blepharitis left eye, upper and lower eyelids: Secondary | ICD-10-CM | POA: Insufficient documentation

## 2020-08-13 DIAGNOSIS — E1136 Type 2 diabetes mellitus with diabetic cataract: Secondary | ICD-10-CM | POA: Diagnosis present

## 2020-08-13 DIAGNOSIS — H0100A Unspecified blepharitis right eye, upper and lower eyelids: Secondary | ICD-10-CM | POA: Insufficient documentation

## 2020-08-13 DIAGNOSIS — H25811 Combined forms of age-related cataract, right eye: Secondary | ICD-10-CM | POA: Diagnosis not present

## 2020-08-13 DIAGNOSIS — H11153 Pinguecula, bilateral: Secondary | ICD-10-CM | POA: Diagnosis not present

## 2020-08-13 HISTORY — PX: CATARACT EXTRACTION W/PHACO: SHX586

## 2020-08-13 LAB — GLUCOSE, CAPILLARY: Glucose-Capillary: 113 mg/dL — ABNORMAL HIGH (ref 70–99)

## 2020-08-13 SURGERY — PHACOEMULSIFICATION, CATARACT, WITH IOL INSERTION
Anesthesia: Monitor Anesthesia Care | Site: Eye | Laterality: Right

## 2020-08-13 MED ORDER — STERILE WATER FOR IRRIGATION IR SOLN
Status: DC | PRN
  Administered 2020-08-13: 250 mL

## 2020-08-13 MED ORDER — BSS IO SOLN
INTRAOCULAR | Status: DC | PRN
  Administered 2020-08-13: 15 mL via INTRAOCULAR

## 2020-08-13 MED ORDER — MIDAZOLAM HCL 2 MG/2ML IJ SOLN
INTRAMUSCULAR | Status: AC
  Filled 2020-08-13: qty 2

## 2020-08-13 MED ORDER — TETRACAINE HCL 0.5 % OP SOLN
1.0000 [drp] | OPHTHALMIC | Status: AC | PRN
  Administered 2020-08-13 (×3): 1 [drp] via OPHTHALMIC

## 2020-08-13 MED ORDER — TROPICAMIDE 1 % OP SOLN
1.0000 [drp] | OPHTHALMIC | Status: AC
  Administered 2020-08-13 (×3): 1 [drp] via OPHTHALMIC

## 2020-08-13 MED ORDER — PHENYLEPHRINE-KETOROLAC 1-0.3 % IO SOLN
INTRAOCULAR | Status: AC
  Filled 2020-08-13: qty 4

## 2020-08-13 MED ORDER — PROVISC 10 MG/ML IO SOLN
INTRAOCULAR | Status: DC | PRN
  Administered 2020-08-13: 0.85 mL via INTRAOCULAR

## 2020-08-13 MED ORDER — PHENYLEPHRINE HCL 2.5 % OP SOLN
1.0000 [drp] | OPHTHALMIC | Status: AC | PRN
  Administered 2020-08-13 (×3): 1 [drp] via OPHTHALMIC

## 2020-08-13 MED ORDER — EPINEPHRINE PF 1 MG/ML IJ SOLN
INTRAMUSCULAR | Status: AC
  Filled 2020-08-13: qty 1

## 2020-08-13 MED ORDER — SODIUM HYALURONATE 23 MG/ML IO SOLN
INTRAOCULAR | Status: DC | PRN
  Administered 2020-08-13: 0.6 mL via INTRAOCULAR

## 2020-08-13 MED ORDER — MIDAZOLAM HCL 2 MG/2ML IJ SOLN
INTRAMUSCULAR | Status: DC | PRN
  Administered 2020-08-13: 1 mg via INTRAVENOUS

## 2020-08-13 MED ORDER — NEOMYCIN-POLYMYXIN-DEXAMETH 3.5-10000-0.1 OP SUSP
OPHTHALMIC | Status: DC | PRN
  Administered 2020-08-13: 1 [drp] via OPHTHALMIC

## 2020-08-13 MED ORDER — PHENYLEPHRINE-KETOROLAC 1-0.3 % IO SOLN
INTRAOCULAR | Status: DC | PRN
  Administered 2020-08-13: 500 mL via OPHTHALMIC

## 2020-08-13 MED ORDER — SODIUM CHLORIDE 0.9% FLUSH
INTRAVENOUS | Status: DC | PRN
  Administered 2020-08-13: 5 mL via INTRAVENOUS

## 2020-08-13 MED ORDER — POVIDONE-IODINE 5 % OP SOLN
OPHTHALMIC | Status: DC | PRN
  Administered 2020-08-13: 1 via OPHTHALMIC

## 2020-08-13 MED ORDER — LIDOCAINE HCL 3.5 % OP GEL
1.0000 "application " | Freq: Once | OPHTHALMIC | Status: AC
  Administered 2020-08-13: 1 via OPHTHALMIC

## 2020-08-13 MED ORDER — LIDOCAINE HCL (PF) 1 % IJ SOLN
INTRAOCULAR | Status: DC | PRN
  Administered 2020-08-13: 1 mL via OPHTHALMIC

## 2020-08-13 SURGICAL SUPPLY — 12 items
CLOTH BEACON ORANGE TIMEOUT ST (SAFETY) ×2 IMPLANT
EYE SHIELD UNIVERSAL CLEAR (GAUZE/BANDAGES/DRESSINGS) ×2 IMPLANT
GLOVE SURG UNDER POLY LF SZ6.5 (GLOVE) ×2 IMPLANT
GLOVE SURG UNDER POLY LF SZ7 (GLOVE) ×2 IMPLANT
NEEDLE HYPO 18GX1.5 BLUNT FILL (NEEDLE) ×2 IMPLANT
PAD ARMBOARD 7.5X6 YLW CONV (MISCELLANEOUS) ×2 IMPLANT
SYR TB 1ML LL NO SAFETY (SYRINGE) ×2 IMPLANT
TAPE SURG TRANSPORE 1 IN (GAUZE/BANDAGES/DRESSINGS) ×1 IMPLANT
TAPE SURGICAL TRANSPORE 1 IN (GAUZE/BANDAGES/DRESSINGS) ×2
TECNIS IOL (Intraocular Lens) ×2 IMPLANT
VISCOELASTIC ADDITIONAL (OPHTHALMIC RELATED) IMPLANT
WATER STERILE IRR 250ML POUR (IV SOLUTION) ×2 IMPLANT

## 2020-08-13 NOTE — Discharge Instructions (Signed)
Please discharge patient when stable, will follow up today with Dr. Marisa Hua at the [facility] office immediately following discharge.  Leave shield in place until visit.  All paperwork with discharge instructions will be given at the office.  [facility] Address:  [address]  [place], [place] [address]             Monitored Anesthesia Care, Care After This sheet gives you information about how to care for yourself after your procedure. Your health care provider may also give you more specific instructions. If you have problems or questions, contact your health care provider. What can I expect after the procedure? After the procedure, it is common to have:  Tiredness.  Forgetfulness about what happened after the procedure.  Impaired judgment for important decisions.  Nausea or vomiting.  Some difficulty with balance. Follow these instructions at home: For the time period you were told by your health care provider:  Rest as needed.  Do not participate in activities where you could fall or become injured.  Do not drive or use machinery.  Do not drink alcohol.  Do not take sleeping pills or medicines that cause drowsiness.  Do not make important decisions or sign legal documents.  Do not take care of children on your own.      Eating and drinking  Follow the diet that is recommended by your health care provider.  Drink enough fluid to keep your urine pale yellow.  If you vomit: ? Drink water, juice, or soup when you can drink without vomiting. ? Make sure you have little or no nausea before eating solid foods. General instructions  Have a responsible adult stay with you for the time you are told. It is important to have someone help care for you until you are awake and alert.  Take over-the-counter and prescription medicines only as told by your health care provider.  If you have sleep apnea, surgery and certain  medicines can increase your risk for breathing problems. Follow instructions from your health care provider about wearing your sleep device: ? Anytime you are sleeping, including during daytime naps. ? While taking prescription pain medicines, sleeping medicines, or medicines that make you drowsy.  Avoid smoking.  Keep all follow-up visits as told by your health care provider. This is important. Contact a health care provider if:  You keep feeling nauseous or you keep vomiting.  You feel light-headed.  You are still sleepy or having trouble with balance after 24 hours.  You develop a rash.  You have a fever.  You have redness or swelling around the IV site. Get help right away if:  You have trouble breathing.  You have new-onset confusion at home. Summary  For several hours after your procedure, you may feel tired. You may also be forgetful and have poor judgment.  Have a responsible adult stay with you for the time you are told. It is important to have someone help care for you until you are awake and alert.  Rest as told. Do not drive or operate machinery. Do not drink alcohol or take sleeping pills.  Get help right away if you have trouble breathing, or if you suddenly become confused. This information is not intended to replace advice given to you by your health care provider. Make sure you discuss any questions you have with your health care provider. Document Revised: 02/12/2020 Document Reviewed: 05/01/2019 Elsevier Patient Education  2021 [employer].

## 2020-08-13 NOTE — Anesthesia Postprocedure Evaluation (Signed)
Anesthesia Post Note  Patient: Stephen Diaz  Procedure(s) Performed: CATARACT EXTRACTION PHACO AND INTRAOCULAR LENS PLACEMENT (IOC) (Right Eye)  Patient location during evaluation: Phase II Anesthesia Type: MAC Level of consciousness: awake and alert and oriented Pain management: pain level controlled Vital Signs Assessment: post-procedure vital signs reviewed and stable Respiratory status: spontaneous breathing, nonlabored ventilation and respiratory function stable Cardiovascular status: blood pressure returned to baseline and stable Postop Assessment: no apparent nausea or vomiting Anesthetic complications: no   No complications documented.   Last Vitals:  Vitals:   08/13/20 0808 08/13/20 0928  BP: 117/77 119/88  Pulse: 91 78  Resp: (!) 21 18  Temp: 36.5 C (!) 36.4 C  SpO2: 98% 98%    Last Pain:  Vitals:   08/13/20 0928  TempSrc: Axillary  PainSc: Christian

## 2020-08-13 NOTE — Interval H&P Note (Signed)
History and Physical Interval Note:  08/13/2020 8:59 AM  Stephen Diaz  has presented today for surgery, with the diagnosis of Nuclear sclerotic cataract - Right eye.  The various methods of treatment have been discussed with the patient and family. After consideration of risks, benefits and other options for treatment, the patient has consented to  Procedure(s) with comments: CATARACT EXTRACTION PHACO AND INTRAOCULAR LENS PLACEMENT (IOC) (Right) - CDE:  as a surgical intervention.  The patient's history has been reviewed, patient examined, no change in status, stable for surgery.  I have reviewed the patient's chart and labs.  Questions were answered to the patient's satisfaction.     Baruch Goldmann

## 2020-08-13 NOTE — Anesthesia Procedure Notes (Signed)
Procedure Name: MAC Date/Time: 08/13/2020 9:07 AM Performed by: Orlie Dakin, CRNA Pre-anesthesia Checklist: Patient identified, Emergency Drugs available, Suction available and Patient being monitored Patient Re-evaluated:Patient Re-evaluated prior to induction Oxygen Delivery Method: Nasal cannula Placement Confirmation: positive ETCO2

## 2020-08-13 NOTE — Op Note (Signed)
Date of procedure: 08/13/20  Pre-operative diagnosis:  Visually significant combined form age-related cataract, Right Eye (H25.811)  Post-operative diagnosis:  Visually significant combined form age-related cataract, Right Eye (H25.811)  Procedure: Removal of cataract via phacoemulsification and insertion of intra-ocular lens [employer] DCB00  +21.0D into the capsular bag of the Right Eye  Attending surgeon: Gerda Diss. Wrzosek, MD, MA  Anesthesia: MAC, Topical Ak[place]  Complications: None  Estimated Blood Loss: <50m (minimal)  Specimens: None  Implants: As above  Indications:  Visually significant age-related cataract, Right Eye  Procedure:  The patient was seen and identified in the pre-operative area. The operative eye was identified and dilated.  The operative eye was marked.  Topical anesthesia was administered to the operative eye.     The patient was then to the operative suite and placed in the supine position.  A timeout was performed confirming the patient, procedure to be performed, and all other relevant information.   The patient's face was prepped and draped in the usual fashion for intra-ocular surgery.  A lid speculum was placed into the operative eye and the surgical microscope moved into place and focused.  A superotemporal paracentesis was created using a 20 gauge paracentesis blade.  Shugarcaine was injected into the anterior chamber.  Viscoelastic was injected into the anterior chamber.  A temporal clear-corneal main wound incision was created using a 2.427mmicrokeratome.  A continuous curvilinear capsulorrhexis was initiated using an irrigating cystitome and completed using capsulorrhexis forceps.  Hydrodissection and hydrodeliniation were performed.  Viscoelastic was injected into the anterior chamber.  A phacoemulsification handpiece and a chopper as a second instrument were used to remove the nucleus and epinucleus. The irrigation/aspiration handpiece was  used to remove any remaining cortical material.   The capsular bag was reinflated with viscoelastic, checked, and found to be intact.  The intraocular lens was inserted into the capsular bag.  The irrigation/aspiration handpiece was used to remove any remaining viscoelastic.  The clear corneal wound and paracentesis wounds were then hydrated and checked with Weck-Cels to be watertight.  The lid-speculum was removed.  The drape was removed.  The patient's face was cleaned with a wet and dry 4x4.   Maxitrol was instilled in the eye. A clear shield was taped over the eye. The patient was taken to the post-operative care unit in good condition, having tolerated the procedure well.  Post-Op Instructions: [facility] and will receive all other orders and instructions.

## 2020-08-13 NOTE — Transfer of Care (Signed)
Immediate Anesthesia Transfer of Care Note  Patient: Stephen Diaz  Procedure(s) Performed: CATARACT EXTRACTION PHACO AND INTRAOCULAR LENS PLACEMENT (IOC) (Right Eye)  Patient Location: Short Stay  Anesthesia Type:MAC  Level of Consciousness: awake, alert  and oriented  Airway & Oxygen Therapy: Patient Spontanous Breathing  Post-op Assessment: Report given to RN, Post -op Vital signs reviewed and stable and Patient moving all extremities X 4  Post vital signs: Reviewed and stable  Last Vitals:  Vitals Value Taken Time  BP    Temp    Pulse    Resp    SpO2      Last Pain:  Vitals:   08/13/20 0808  TempSrc: Oral  PainSc: 0-No pain         Complications: No complications documented.

## 2020-08-13 NOTE — Anesthesia Preprocedure Evaluation (Signed)
Anesthesia Evaluation  Patient identified by MRN, date of birth, ID band Patient awake    Reviewed: Allergy & Precautions, NPO status , Patient's Chart, lab work & pertinent test results  History of Anesthesia Complications Negative for: history of anesthetic complications  Airway Mallampati: II  TM Distance: >3 FB Neck ROM: Full   Comment: Neck SX Dental  (+) Upper Dentures, Lower Dentures   Pulmonary COPD,  COPD inhaler, Current SmokerPatient did not abstain from smoking.,    Pulmonary exam normal breath sounds clear to auscultation       Cardiovascular Exercise Tolerance: Good hypertension, Pt. on medications Normal cardiovascular exam Rhythm:Regular Rate:Normal     Neuro/Psych negative neurological ROS  negative psych ROS   GI/Hepatic Neg liver ROS,   Endo/Other  diabetes, Well Controlled, Type 2, Oral Hypoglycemic Agents  Renal/GU negative Renal ROS     Musculoskeletal negative musculoskeletal ROS (+)   Abdominal   Peds  Hematology negative hematology ROS (+)   Anesthesia Other Findings   Reproductive/Obstetrics negative OB ROS                            Anesthesia Physical Anesthesia Plan  ASA: II  Anesthesia Plan: MAC   Post-op Pain Management:    Induction:   PONV Risk Score and Plan:   Airway Management Planned: Nasal Cannula and Natural Airway  Additional Equipment:   Intra-op Plan:   Post-operative Plan:   Informed Consent: I have reviewed the patients History and Physical, chart, labs and discussed the procedure including the risks, benefits and alternatives for the proposed anesthesia with the patient or authorized representative who has indicated his/her understanding and acceptance.     Dental advisory given  Plan Discussed with: CRNA and Surgeon  Anesthesia Plan Comments:        Anesthesia Quick Evaluation

## 2020-08-16 ENCOUNTER — Encounter (HOSPITAL_COMMUNITY): Payer: Self-pay | Admitting: Ophthalmology

## 2020-09-01 ENCOUNTER — Other Ambulatory Visit: Payer: Self-pay

## 2020-09-01 ENCOUNTER — Encounter (HOSPITAL_BASED_OUTPATIENT_CLINIC_OR_DEPARTMENT_OTHER): Payer: Self-pay | Admitting: Orthopaedic Surgery

## 2020-09-06 ENCOUNTER — Other Ambulatory Visit (HOSPITAL_COMMUNITY)
Admission: RE | Admit: 2020-09-06 | Discharge: 2020-09-06 | Disposition: A | Discharge status: Home / Self Care | Payer: Medicare Other | Source: Ambulatory Visit | Attending: Orthopaedic Surgery | Admitting: Orthopaedic Surgery

## 2020-09-06 ENCOUNTER — Encounter (HOSPITAL_BASED_OUTPATIENT_CLINIC_OR_DEPARTMENT_OTHER)
Admission: RE | Admit: 2020-09-06 | Discharge: 2020-09-06 | Disposition: A | Discharge status: Home / Self Care | Payer: Medicare Other | Source: Ambulatory Visit | Attending: Orthopaedic Surgery | Admitting: Orthopaedic Surgery

## 2020-09-06 DIAGNOSIS — Z20822 Contact with and (suspected) exposure to covid-19: Secondary | ICD-10-CM | POA: Insufficient documentation

## 2020-09-06 DIAGNOSIS — Z01818 Encounter for other preprocedural examination: Secondary | ICD-10-CM | POA: Insufficient documentation

## 2020-09-06 DIAGNOSIS — Z01812 Encounter for preprocedural laboratory examination: Secondary | ICD-10-CM | POA: Insufficient documentation

## 2020-09-06 DIAGNOSIS — E119 Type 2 diabetes mellitus without complications: Secondary | ICD-10-CM | POA: Insufficient documentation

## 2020-09-06 DIAGNOSIS — I10 Essential (primary) hypertension: Secondary | ICD-10-CM | POA: Diagnosis not present

## 2020-09-06 LAB — BASIC METABOLIC PANEL
Anion gap: 7 (ref 5–15)
BUN: 9 mg/dL (ref 8–23)
CO2: 29 mmol/L (ref 22–32)
Calcium: 9.8 mg/dL (ref 8.9–10.3)
Chloride: 101 mmol/L (ref 98–111)
Creatinine, Ser: 0.65 mg/dL (ref 0.61–1.24)
GFR, Estimated: >60 mL/min (ref >60)
Glucose, Bld: 102 mg/dL — ABNORMAL HIGH (ref 70–99)
Potassium: 4.6 mmol/L (ref 3.5–5.1)
Sodium: 137 mmol/L (ref 135–145)

## 2020-09-06 LAB — SURGICAL PCR SCREEN
MRSA, PCR: NEGATIVE
Staphylococcus aureus: NEGATIVE

## 2020-09-06 LAB — SARS CORONAVIRUS 2 (TAT 6-24 HRS): SARS Coronavirus 2: NEGATIVE

## 2020-09-06 NOTE — Progress Notes (Signed)
      Enhanced Recovery after Surgery for Orthopedics Enhanced Recovery after Surgery is a protocol used to improve the stress on your body and your recovery after surgery.  Patient Instructions  . The night before surgery:  o No food after midnight. ONLY clear liquids after midnight  . The day of surgery (if you do NOT have diabetes):  o Drink ONE (1) Pre-Surgery Clear Ensure as directed.   o This drink was given to you during your hospital  pre-op appointment visit. o The pre-op nurse will instruct you on the time to drink the  Pre-Surgery Ensure depending on your surgery time. o Finish the drink at the designated time by the pre-op nurse.  o Nothing else to drink after completing the  Pre-Surgery Clear Ensure.  . The day of surgery (if you have diabetes): o Drink ONE (1) Gatorade 2 (G2) as directed. o This drink was given to you during your hospital  pre-op appointment visit.  o The pre-op nurse will instruct you on the time to drink the   Gatorade 2 (G2) depending on your surgery time. o Color of the Gatorade may vary. Red is not allowed. o Nothing else to drink after completing the  Gatorade 2 (G2).         If you have questions, please contact your surgeon's office. Surgical soap given with instructions, pt verbalized understanding. Benzoyl peroxide gel given with written instructions, pt verbalized understanding.

## 2020-09-06 NOTE — H&P (Signed)
PREOPERATIVE H&P  Chief Complaint: RIGHT SHOULDER OSTEOARTHRITIS  HPI: Stephen Diaz is a 65 y.o. male who is scheduled for Procedure(s): REVERSE TOTAL SHOULDER ARTHROPLASTY.   Patient has a past medical history significant for diabetes, COPD, HTN.   The patient is a 65 year old retiree who had been fishing and had pain in his shoulder after picking up a 20 pound fish.  He is limited on his right shoulder now.  He is not happy with the function of his shoulder.    His symptoms are rated as moderate to severe, and have been worsening.  This is significantly impairing activities of daily living.    Please see clinic note for further details on this patient's care.    He has elected for surgical management.   Past Medical History:  Diagnosis Date  . Arthritis    right shoulder  . COPD (chronic obstructive pulmonary disease) ([place])   . Diabetes mellitus without complication ([place])   . Hypertension   . Right foot drop    from pinched nerve in back, wears brace   Past Surgical History:  Procedure Laterality Date  . CATARACT EXTRACTION Left   . CATARACT EXTRACTION W/PHACO Right 08/13/2020   Procedure: CATARACT EXTRACTION PHACO AND INTRAOCULAR LENS PLACEMENT (IOC);  Surgeon: Baruch Goldmann, MD;  Location: [facility];  Service: Ophthalmology;  Laterality: Right;  CDE: 8.24  . HERNIA REPAIR    . screws and steel plate in neck  (R6-0)     10-12 years ago  x2   Social History   Socioeconomic History  . Marital status: Widowed    Spouse name: Not on file  . Number of children: Not on file  . Years of education: Not on file  . Highest education level: Not on file  Occupational History  . Not on file  Tobacco Use  . Smoking status: Current Every Day Smoker    Packs/day: 1.00    Years: 50.00    Pack years: 50.00    Types: Cigarettes  . Smokeless tobacco: Never Used  Vaping Use  . Vaping Use: Never used  Substance and Sexual Activity  . Alcohol use: Not Currently  . Drug  use: Not Currently  . Sexual activity: Not Currently  Other Topics Concern  . Not on file  Social History Narrative  . Not on file   Social Determinants of Health   Financial Resource Strain: Not on file  Food Insecurity: Not on file  Transportation Needs: Not on file  Physical Activity: Not on file  Stress: Not on file  Social Connections: Not on file   History reviewed. No pertinent family history. No Known Allergies Prior to Admission medications   Medication Sig Start Date End Date Taking? Authorizing Provider  albuterol (VENTOLIN HFA) 108 (90 Base) MCG/ACT inhaler Inhale 2 puffs into the lungs every 6 (six) hours as needed for wheezing or shortness of breath.   Yes [provider]  aspirin EC 81 MG tablet Take 81 mg by mouth daily. Swallow whole.   Yes [provider]  lisinopril (ZESTRIL) 10 MG tablet Take 10 mg by mouth daily.   Yes [provider]  metFORMIN (GLUCOPHAGE) 500 MG tablet Take 1,000 mg by mouth daily with breakfast. 05/10/20  Yes [provider]  SYMBICORT 160-4.5 MCG/ACT inhaler Inhale 2 puffs into the lungs 2 (two) times daily. 06/08/20  Yes [provider]    ROS: All other systems have been reviewed and were otherwise negative with  the exception of those mentioned in the HPI and as above.  Physical Exam: General: Alert, no acute distress Cardiovascular: No pedal edema Respiratory: No cyanosis, no use of accessory musculature GI: No organomegaly, abdomen is soft and non-tender Skin: No lesions in the area of chief complaint Neurologic: Sensation intact distally Psychiatric: Patient is competent for consent with normal mood and affect Lymphatic: No axillary or cervical lymphadenopathy  MUSCULOSKELETAL:  Right shoulder active forward elevation to 100, passive to 120, external rotation to 45, internal rotation to back pocket.  Warm, well-perfused extremity otherwise.  No incisions around the shoulder.  He has  some scapular winging noted that is mostly noticed during forward elevation, though not so much when he does a wall press.    Imaging: X-rays and MRI demonstrate an irreparable cuff tear.  No obvious bony pathology.  Assessment: Irreparable right cuff tear.  Plan: Plan for Procedure(s): REVERSE TOTAL SHOULDER ARTHROPLASTY  The risks benefits and alternatives were discussed with the patient including but not limited to the risks of nonoperative treatment, versus surgical intervention including infection, bleeding, nerve injury,  blood clots, cardiopulmonary complications, morbidity, mortality, among others, and they were willing to proceed.   We additionally specifically discussed risks of axillary nerve injury, infection, periprosthetic fracture, continued pain and longevity of implants prior to beginning procedure.    Patient will be closely monitored in PACU for medical stabilization and pain control. If found stable in PACU, patient may be discharged home with outpatient follow-up. If any concerns regarding patient's stabilization patient will be admitted for observation after surgery. The patient is planning to be discharged home with outpatient PT.   The patient acknowledged the explanation, agreed to proceed with the plan and consent was signed.   Patient received operative clearance from his PCP, Alyssa Boles PA-C.   Operative Plan: Right reverse total shoulder arthroplasty Discharge Medications: Tylenol, Celebrex, Gabapentin, Oxycodone, Zofran DVT Prophylaxis: Aspirin BID Physical Therapy: Outpatient PT Special Discharge needs: Sling. IceMan.    Ethelda Chick, PA-C  09/06/2020 4:03 PM

## 2020-09-08 ENCOUNTER — Other Ambulatory Visit: Payer: Self-pay

## 2020-09-08 ENCOUNTER — Encounter (HOSPITAL_BASED_OUTPATIENT_CLINIC_OR_DEPARTMENT_OTHER): Payer: Self-pay | Admitting: Orthopaedic Surgery

## 2020-09-08 ENCOUNTER — Ambulatory Visit (HOSPITAL_BASED_OUTPATIENT_CLINIC_OR_DEPARTMENT_OTHER): Payer: Medicare Other | Admitting: Anesthesiology

## 2020-09-08 ENCOUNTER — Ambulatory Visit (HOSPITAL_COMMUNITY): Payer: Medicare Other

## 2020-09-08 ENCOUNTER — Ambulatory Visit (HOSPITAL_BASED_OUTPATIENT_CLINIC_OR_DEPARTMENT_OTHER)
Admission: RE | Admit: 2020-09-08 | Discharge: 2020-09-09 | Disposition: A | Discharge status: Home / Self Care | Payer: Medicare Other | Attending: Orthopaedic Surgery | Admitting: Orthopaedic Surgery

## 2020-09-08 ENCOUNTER — Encounter (HOSPITAL_BASED_OUTPATIENT_CLINIC_OR_DEPARTMENT_OTHER)
Admission: RE | Disposition: A | Discharge status: Home / Self Care | Payer: Self-pay | Source: Home / Self Care | Attending: Orthopaedic Surgery

## 2020-09-08 DIAGNOSIS — J449 Chronic obstructive pulmonary disease, unspecified: Secondary | ICD-10-CM | POA: Diagnosis not present

## 2020-09-08 DIAGNOSIS — Z7982 Long term (current) use of aspirin: Secondary | ICD-10-CM | POA: Insufficient documentation

## 2020-09-08 DIAGNOSIS — F17210 Nicotine dependence, cigarettes, uncomplicated: Secondary | ICD-10-CM | POA: Insufficient documentation

## 2020-09-08 DIAGNOSIS — M19011 Primary osteoarthritis, right shoulder: Secondary | ICD-10-CM | POA: Diagnosis not present

## 2020-09-08 DIAGNOSIS — I10 Essential (primary) hypertension: Secondary | ICD-10-CM | POA: Diagnosis not present

## 2020-09-08 DIAGNOSIS — M75101 Unspecified rotator cuff tear or rupture of right shoulder, not specified as traumatic: Secondary | ICD-10-CM | POA: Insufficient documentation

## 2020-09-08 DIAGNOSIS — Z7984 Long term (current) use of oral hypoglycemic drugs: Secondary | ICD-10-CM | POA: Diagnosis not present

## 2020-09-08 DIAGNOSIS — E119 Type 2 diabetes mellitus without complications: Secondary | ICD-10-CM | POA: Diagnosis not present

## 2020-09-08 DIAGNOSIS — Z96611 Presence of right artificial shoulder joint: Secondary | ICD-10-CM

## 2020-09-08 DIAGNOSIS — Z09 Encounter for follow-up examination after completed treatment for conditions other than malignant neoplasm: Secondary | ICD-10-CM

## 2020-09-08 HISTORY — DX: Unspecified osteoarthritis, unspecified site: M19.90

## 2020-09-08 HISTORY — PX: REVERSE SHOULDER ARTHROPLASTY: SHX5054

## 2020-09-08 HISTORY — DX: Foot drop, right foot: M21.371

## 2020-09-08 LAB — GLUCOSE, CAPILLARY
Glucose-Capillary: 117 mg/dL — ABNORMAL HIGH (ref 70–99)
Glucose-Capillary: 149 mg/dL — ABNORMAL HIGH (ref 70–99)

## 2020-09-08 IMAGING — CR DG SHOULDER 2+V PORT*R*
2 series · 2 of 2 positions shown · non-contrast
Comparison: None.

CLINICAL DATA: Postop.

EXAM:
PORTABLE RIGHT SHOULDER

[shoulder ap (1 of 2)]
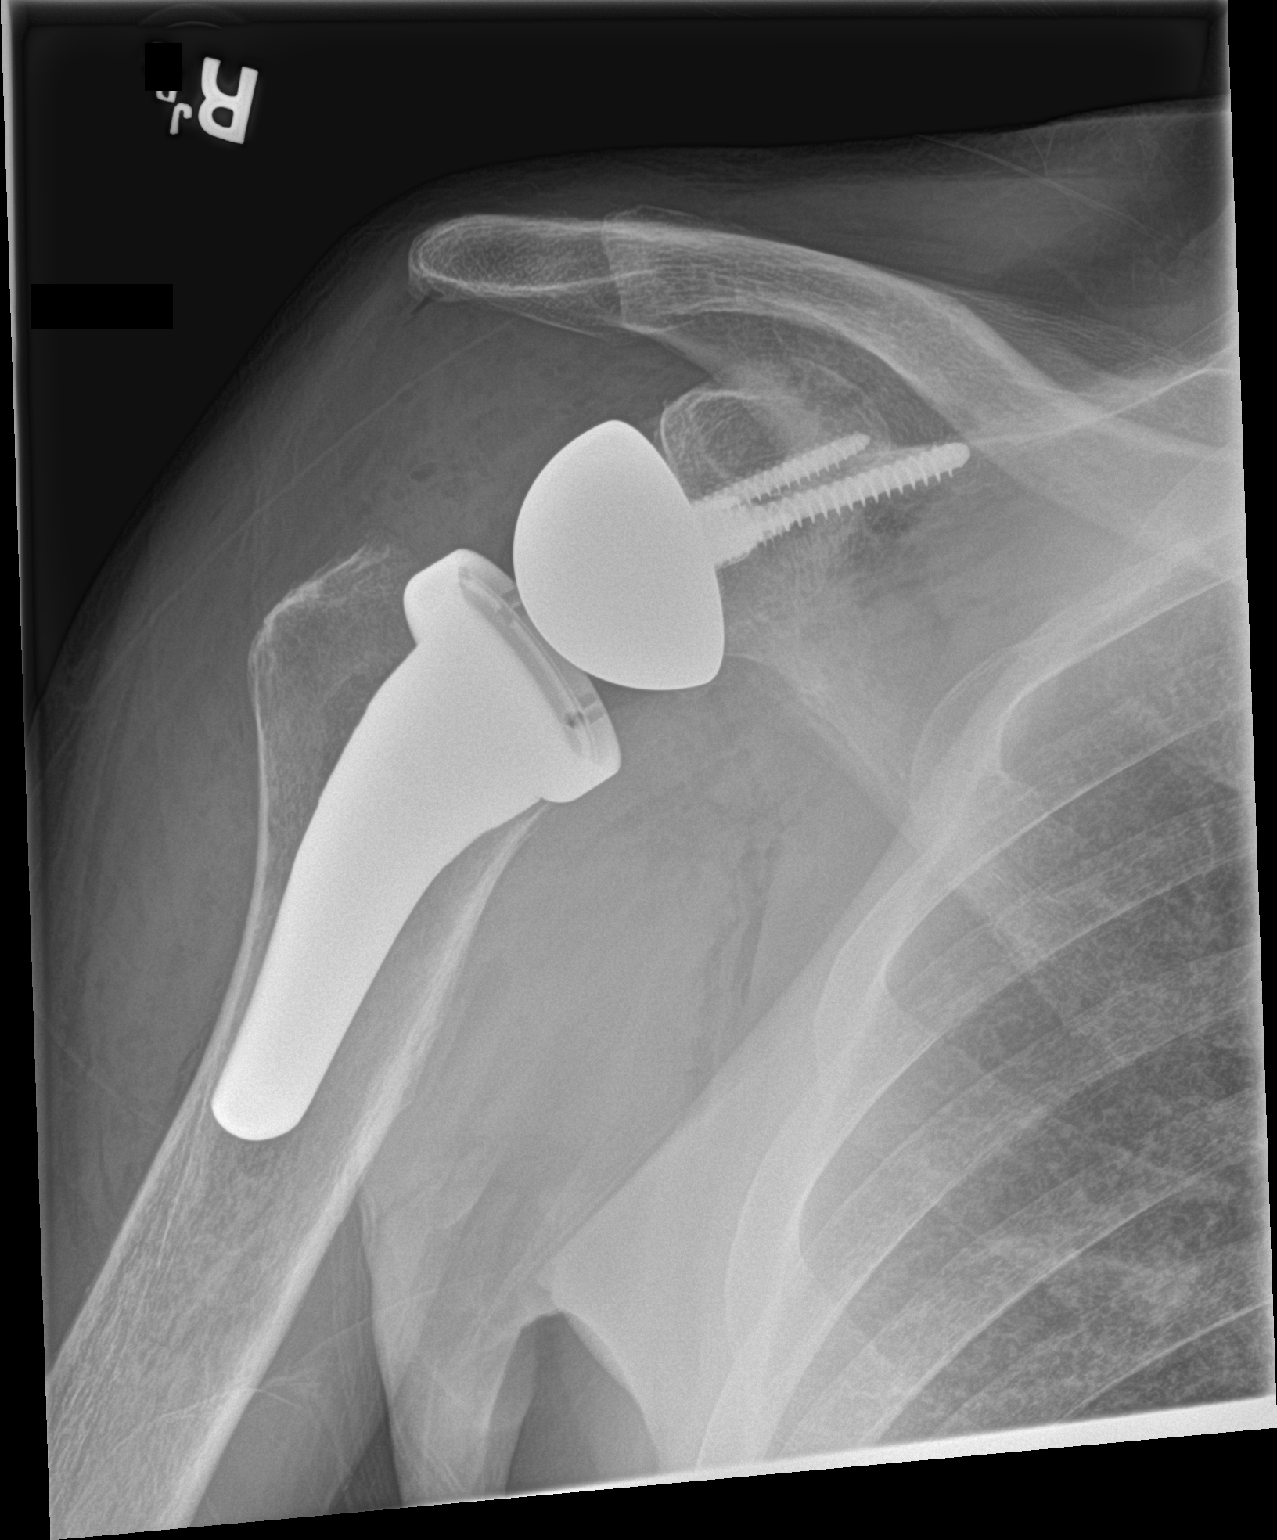

[shoulder ap (2 of 2)]
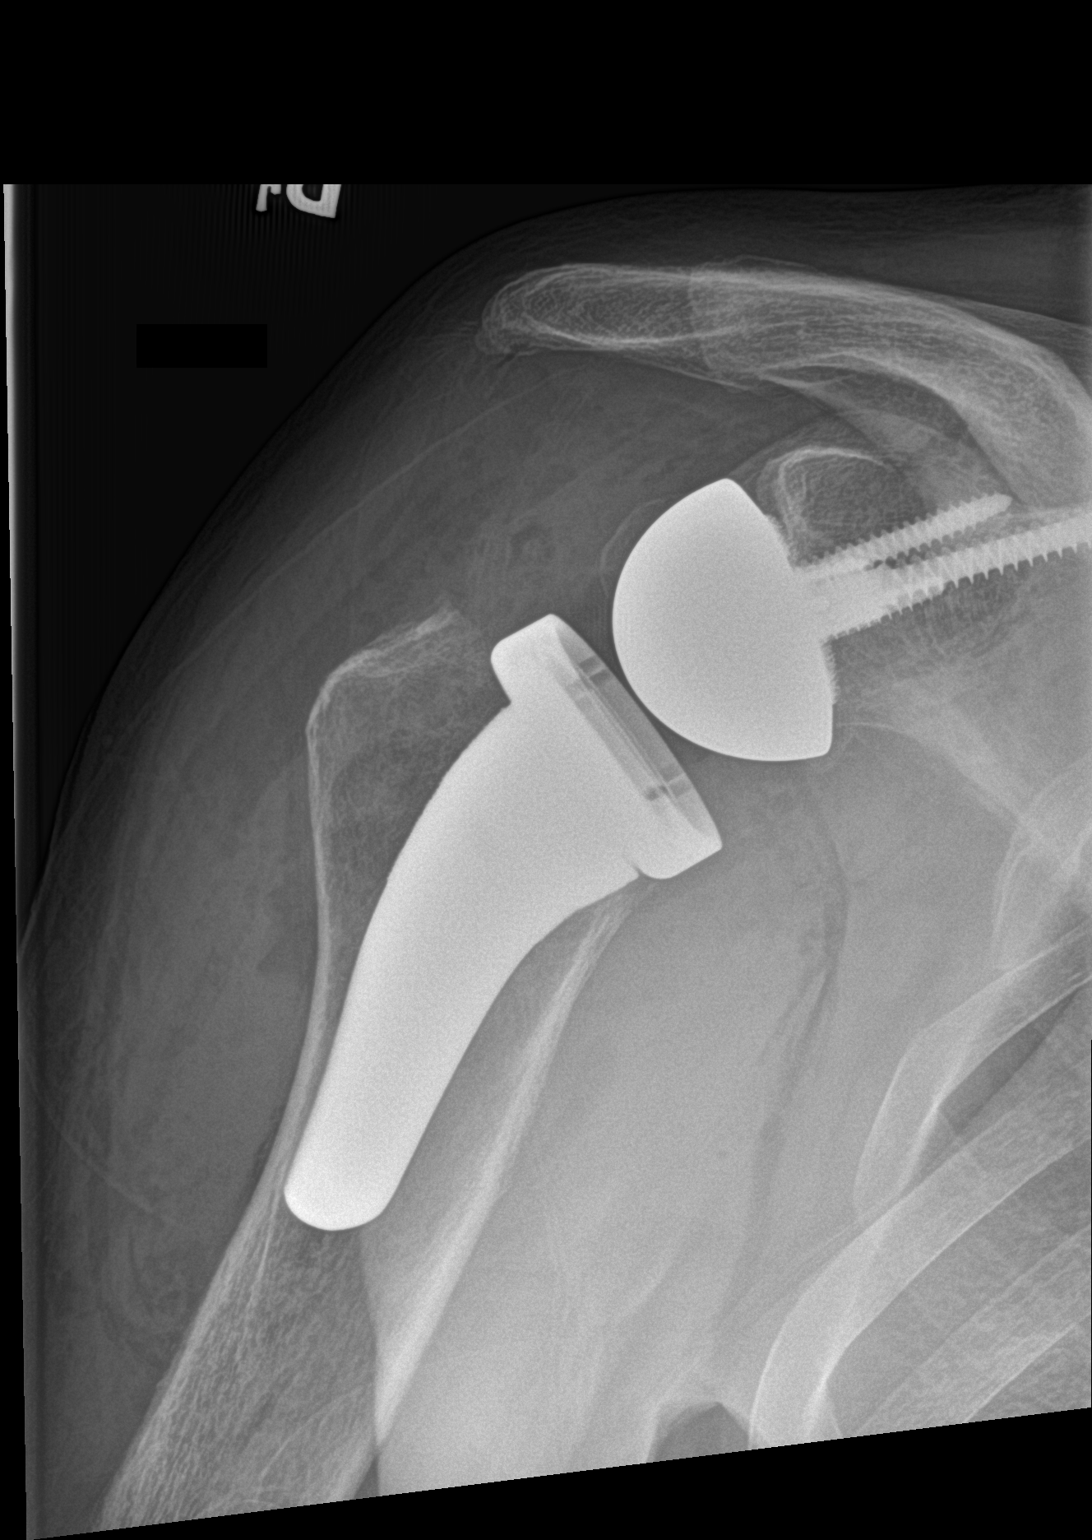

[2 of 2 positions shown; findings below may reference images not displayed]

FINDINGS: Reverse right shoulder arthroplasty in expected alignment. No
periprosthetic lucency or fracture. Recent postsurgical change
includes air and edema in the soft tissues.
IMPRESSION: Reverse right shoulder arthroplasty without immediate postoperative
complication.

## 2020-09-08 SURGERY — ARTHROPLASTY, SHOULDER, TOTAL, REVERSE
Anesthesia: General | Site: Shoulder | Laterality: Right

## 2020-09-08 MED ORDER — FENTANYL CITRATE (PF) 100 MCG/2ML IJ SOLN
INTRAMUSCULAR | Status: AC
  Filled 2020-09-08: qty 2

## 2020-09-08 MED ORDER — TRANEXAMIC ACID-NACL 1000-0.7 MG/100ML-% IV SOLN
INTRAVENOUS | Status: AC
  Filled 2020-09-08: qty 100

## 2020-09-08 MED ORDER — PROPOFOL 10 MG/ML IV BOLUS
INTRAVENOUS | Status: DC | PRN
  Administered 2020-09-08: 30 mg via INTRAVENOUS
  Administered 2020-09-08: 140 mg via INTRAVENOUS

## 2020-09-08 MED ORDER — ONDANSETRON HCL 4 MG PO TABS: 4.0000 mg | ORAL_TABLET | Freq: Four times a day (QID) | ORAL | Status: DC | PRN

## 2020-09-08 MED ORDER — ROCURONIUM BROMIDE 10 MG/ML (PF) SYRINGE
PREFILLED_SYRINGE | INTRAVENOUS | Status: DC | PRN
  Administered 2020-09-08: 60 mg via INTRAVENOUS
  Administered 2020-09-08: 10 mg via INTRAVENOUS

## 2020-09-08 MED ORDER — BISACODYL 5 MG PO TBEC: 5.0000 mg | DELAYED_RELEASE_TABLET | Freq: Every day | ORAL | Status: DC | PRN

## 2020-09-08 MED ORDER — ONDANSETRON HCL 4 MG/2ML IJ SOLN: 4.0000 mg | Freq: Once | INTRAMUSCULAR | Status: DC | PRN

## 2020-09-08 MED ORDER — MIDAZOLAM HCL 2 MG/2ML IJ SOLN
INTRAMUSCULAR | Status: AC
  Filled 2020-09-08: qty 2

## 2020-09-08 MED ORDER — PROPOFOL 500 MG/50ML IV EMUL
INTRAVENOUS | Status: AC
  Filled 2020-09-08: qty 150

## 2020-09-08 MED ORDER — TRANEXAMIC ACID-NACL 1000-0.7 MG/100ML-% IV SOLN
1000.0000 mg | INTRAVENOUS | Status: AC
  Administered 2020-09-08: 1000 mg via INTRAVENOUS

## 2020-09-08 MED ORDER — FENTANYL CITRATE (PF) 100 MCG/2ML IJ SOLN
50.0000 ug | Freq: Once | INTRAMUSCULAR | Status: AC
  Administered 2020-09-08: 50 ug via INTRAVENOUS

## 2020-09-08 MED ORDER — BUPIVACAINE HCL (PF) 0.25 % IJ SOLN
INTRAMUSCULAR | Status: AC
  Filled 2020-09-08: qty 30

## 2020-09-08 MED ORDER — MIDAZOLAM HCL 2 MG/2ML IJ SOLN
1.0000 mg | Freq: Once | INTRAMUSCULAR | Status: AC
  Administered 2020-09-08: 1 mg via INTRAVENOUS

## 2020-09-08 MED ORDER — EPINEPHRINE PF 1 MG/ML IJ SOLN
INTRAMUSCULAR | Status: AC
  Filled 2020-09-08: qty 4

## 2020-09-08 MED ORDER — FENTANYL CITRATE (PF) 100 MCG/2ML IJ SOLN: 25.0000 ug | INTRAMUSCULAR | Status: DC | PRN

## 2020-09-08 MED ORDER — ACETAMINOPHEN 500 MG PO TABS
1000.0000 mg | ORAL_TABLET | Freq: Three times a day (TID) | ORAL | Status: DC
  Administered 2020-09-08 – 2020-09-09 (×2): 1000 mg via ORAL
  Filled 2020-09-08 (×2): qty 2

## 2020-09-08 MED ORDER — LISINOPRIL 10 MG PO TABS
10.0000 mg | ORAL_TABLET | Freq: Every day | ORAL | Status: DC
  Filled 2020-09-08: qty 1

## 2020-09-08 MED ORDER — SUGAMMADEX SODIUM 200 MG/2ML IV SOLN
INTRAVENOUS | Status: DC | PRN
  Administered 2020-09-08: 200 mg via INTRAVENOUS

## 2020-09-08 MED ORDER — POLYETHYLENE GLYCOL 3350 17 G PO PACK: 17.0000 g | PACK | Freq: Every day | ORAL | Status: DC | PRN

## 2020-09-08 MED ORDER — OXYCODONE HCL 5 MG PO TABS: 5.0000 mg | ORAL_TABLET | ORAL | Status: DC | PRN

## 2020-09-08 MED ORDER — VANCOMYCIN HCL 1000 MG IV SOLR
INTRAVENOUS | Status: AC
  Filled 2020-09-08: qty 3000

## 2020-09-08 MED ORDER — CEFAZOLIN SODIUM-DEXTROSE 2-4 GM/100ML-% IV SOLN
2.0000 g | INTRAVENOUS | Status: AC
  Administered 2020-09-08: 2 g via INTRAVENOUS

## 2020-09-08 MED ORDER — METFORMIN HCL 500 MG PO TABS
1000.0000 mg | ORAL_TABLET | Freq: Every day | ORAL | Status: DC
  Administered 2020-09-09: 1000 mg via ORAL
  Filled 2020-09-08: qty 2

## 2020-09-08 MED ORDER — ONDANSETRON HCL 4 MG/2ML IJ SOLN
INTRAMUSCULAR | Status: DC | PRN
  Administered 2020-09-08: 4 mg via INTRAVENOUS

## 2020-09-08 MED ORDER — VANCOMYCIN HCL 1000 MG IV SOLR
INTRAVENOUS | Status: DC | PRN
  Administered 2020-09-08: 1000 mg

## 2020-09-08 MED ORDER — LACTATED RINGERS IV SOLN: INTRAVENOUS | Status: DC

## 2020-09-08 MED ORDER — OXYCODONE HCL 5 MG/5ML PO SOLN: 5.0000 mg | Freq: Once | ORAL | Status: DC | PRN

## 2020-09-08 MED ORDER — GABAPENTIN 300 MG PO CAPS
ORAL_CAPSULE | ORAL | Status: AC
  Filled 2020-09-08: qty 1

## 2020-09-08 MED ORDER — CEFAZOLIN SODIUM-DEXTROSE 2-4 GM/100ML-% IV SOLN
INTRAVENOUS | Status: AC
  Filled 2020-09-08: qty 100

## 2020-09-08 MED ORDER — GABAPENTIN 300 MG PO CAPS
300.0000 mg | ORAL_CAPSULE | Freq: Once | ORAL | Status: AC
  Administered 2020-09-08: 300 mg via ORAL

## 2020-09-08 MED ORDER — CELECOXIB 200 MG PO CAPS
200.0000 mg | ORAL_CAPSULE | Freq: Two times a day (BID) | ORAL | Status: DC
  Administered 2020-09-08: 200 mg via ORAL
  Filled 2020-09-08: qty 1

## 2020-09-08 MED ORDER — ACETAMINOPHEN 500 MG PO TABS
1000.0000 mg | ORAL_TABLET | Freq: Once | ORAL | Status: AC
  Administered 2020-09-08: 1000 mg via ORAL

## 2020-09-08 MED ORDER — OXYCODONE HCL 5 MG PO TABS: 10.0000 mg | ORAL_TABLET | ORAL | Status: DC | PRN

## 2020-09-08 MED ORDER — ALBUTEROL SULFATE HFA 108 (90 BASE) MCG/ACT IN AERS
2.0000 | INHALATION_SPRAY | Freq: Four times a day (QID) | RESPIRATORY_TRACT | Status: DC | PRN
  Administered 2020-09-09: 2 via RESPIRATORY_TRACT

## 2020-09-08 MED ORDER — LIDOCAINE 2% (20 MG/ML) 5 ML SYRINGE
INTRAMUSCULAR | Status: DC | PRN
  Administered 2020-09-08: 60 mg via INTRAVENOUS

## 2020-09-08 MED ORDER — METHOCARBAMOL 1000 MG/10ML IJ SOLN
500.0000 mg | Freq: Four times a day (QID) | INTRAVENOUS | Status: DC | PRN
  Filled 2020-09-08: qty 5

## 2020-09-08 MED ORDER — LIDOCAINE 2% (20 MG/ML) 5 ML SYRINGE
INTRAMUSCULAR | Status: AC
  Filled 2020-09-08: qty 5

## 2020-09-08 MED ORDER — METHOCARBAMOL 500 MG PO TABS: 500.0000 mg | ORAL_TABLET | Freq: Four times a day (QID) | ORAL | Status: DC | PRN

## 2020-09-08 MED ORDER — ACETAMINOPHEN 500 MG PO TABS
ORAL_TABLET | ORAL | Status: AC
  Filled 2020-09-08: qty 2

## 2020-09-08 MED ORDER — ONDANSETRON HCL 4 MG/2ML IJ SOLN: 4.0000 mg | Freq: Four times a day (QID) | INTRAMUSCULAR | Status: DC | PRN

## 2020-09-08 MED ORDER — CEFAZOLIN SODIUM-DEXTROSE 2-4 GM/100ML-% IV SOLN: 2.0000 g | Freq: Four times a day (QID) | INTRAVENOUS | Status: AC

## 2020-09-08 MED ORDER — PHENYLEPHRINE 40 MCG/ML (10ML) SYRINGE FOR IV PUSH (FOR BLOOD PRESSURE SUPPORT)
PREFILLED_SYRINGE | INTRAVENOUS | Status: DC | PRN
  Administered 2020-09-08: 80 ug via INTRAVENOUS
  Administered 2020-09-08: 120 ug via INTRAVENOUS
  Administered 2020-09-08: 40 ug via INTRAVENOUS
  Administered 2020-09-08: 80 ug via INTRAVENOUS

## 2020-09-08 MED ORDER — DOCUSATE SODIUM 100 MG PO CAPS: 100.0000 mg | ORAL_CAPSULE | Freq: Two times a day (BID) | ORAL | Status: DC

## 2020-09-08 MED ORDER — METOCLOPRAMIDE HCL 5 MG PO TABS: 5.0000 mg | ORAL_TABLET | Freq: Three times a day (TID) | ORAL | Status: DC | PRN

## 2020-09-08 MED ORDER — HYDROMORPHONE HCL 1 MG/ML IJ SOLN: 0.2500 mg | INTRAMUSCULAR | Status: DC | PRN

## 2020-09-08 MED ORDER — OXYCODONE HCL 5 MG PO TABS
5.0000 mg | ORAL_TABLET | Freq: Once | ORAL | Status: DC | PRN
Start: 2020-09-08 — End: 2020-09-08

## 2020-09-08 MED ORDER — PHENYLEPHRINE 40 MCG/ML (10ML) SYRINGE FOR IV PUSH (FOR BLOOD PRESSURE SUPPORT)
PREFILLED_SYRINGE | INTRAVENOUS | Status: AC
  Filled 2020-09-08: qty 10

## 2020-09-08 MED ORDER — DEXAMETHASONE SODIUM PHOSPHATE 10 MG/ML IJ SOLN
INTRAMUSCULAR | Status: AC
  Filled 2020-09-08: qty 1

## 2020-09-08 MED ORDER — ONDANSETRON HCL 4 MG/2ML IJ SOLN
INTRAMUSCULAR | Status: AC
  Filled 2020-09-08: qty 2

## 2020-09-08 MED ORDER — MOMETASONE FURO-FORMOTEROL FUM 200-5 MCG/ACT IN AERO
2.0000 | INHALATION_SPRAY | Freq: Two times a day (BID) | RESPIRATORY_TRACT | Status: DC
  Filled 2020-09-08: qty 8.8

## 2020-09-08 MED ORDER — METOCLOPRAMIDE HCL 5 MG/ML IJ SOLN: 5.0000 mg | Freq: Three times a day (TID) | INTRAMUSCULAR | Status: DC | PRN

## 2020-09-08 MED ORDER — DEXAMETHASONE SODIUM PHOSPHATE 4 MG/ML IJ SOLN
INTRAMUSCULAR | Status: DC | PRN
  Administered 2020-09-08: 4 mg via INTRAVENOUS

## 2020-09-08 SURGICAL SUPPLY — 69 items
BASEPLATE GLENOID STD REV 42 (Joint) ×2 IMPLANT
BASEPLATE REV SHOULDER 29 OD (Plate) ×2 IMPLANT
BIT DRILL 3.2 PERIPHERAL SCREW (BIT) ×2 IMPLANT
BLADE HEX COATED 2.75 (ELECTRODE) IMPLANT
BLADE SAW SAG 73X25 THK (BLADE) ×1
BLADE SAW SGTL 73X25 THK (BLADE) ×1 IMPLANT
BLADE SURG 10 STRL SS (BLADE) IMPLANT
BLADE SURG 15 STRL LF DISP TIS (BLADE) IMPLANT
BLADE SURG 15 STRL SS (BLADE)
BNDG COHESIVE 4X5 TAN STRL (GAUZE/BANDAGES/DRESSINGS) IMPLANT
CHLORAPREP W/TINT 26 (MISCELLANEOUS) ×2 IMPLANT
CLSR STERI-STRIP ANTIMIC 1/2X4 (GAUZE/BANDAGES/DRESSINGS) ×4 IMPLANT
COOLER ICEMAN CLASSIC (MISCELLANEOUS) ×2 IMPLANT
COVER BACK TABLE 60X90IN (DRAPES) ×2 IMPLANT
COVER MAYO STAND STRL (DRAPES) ×2 IMPLANT
COVER WAND RF STERILE (DRAPES) IMPLANT
DECANTER SPIKE VIAL GLASS SM (MISCELLANEOUS) IMPLANT
DRAPE IMP U-DRAPE 54X76 (DRAPES) ×2 IMPLANT
DRAPE INCISE IOBAN 66X45 STRL (DRAPES) ×2 IMPLANT
DRAPE U-SHAPE 47X51 STRL (DRAPES) ×2 IMPLANT
DRAPE U-SHAPE 76X120 STRL (DRAPES) ×4 IMPLANT
DRSG AQUACEL AG ADV 3.5X 6 (GAUZE/BANDAGES/DRESSINGS) ×2 IMPLANT
ELECT BLADE 4.0 EZ CLEAN MEGAD (MISCELLANEOUS) ×2
ELECT REM PT RETURN 9FT ADLT (ELECTROSURGICAL) ×2
ELECTRODE BLDE 4.0 EZ CLN MEGD (MISCELLANEOUS) ×1 IMPLANT
ELECTRODE REM PT RTRN 9FT ADLT (ELECTROSURGICAL) ×1 IMPLANT
GLOVE SRG 8 PF TXTR STRL LF DI (GLOVE) ×2 IMPLANT
GLOVE SURG ENC MOIS LTX SZ6.5 (GLOVE) ×2 IMPLANT
GLOVE SURG LTX SZ6.5 (GLOVE) ×2 IMPLANT
GLOVE SURG LTX SZ8 (GLOVE) ×2 IMPLANT
GLOVE SURG UNDER POLY LF SZ6.5 (GLOVE) ×2 IMPLANT
GLOVE SURG UNDER POLY LF SZ7.5 (GLOVE) ×2 IMPLANT
GLOVE SURG UNDER POLY LF SZ8 (GLOVE) ×2
GOWN STRL REUS W/ TWL LRG LVL3 (GOWN DISPOSABLE) ×2 IMPLANT
GOWN STRL REUS W/TWL LRG LVL3 (GOWN DISPOSABLE) ×2
GOWN STRL REUS W/TWL XL LVL3 (GOWN DISPOSABLE) ×2 IMPLANT
GUIDEWIRE GLENOID 2.5X220 (WIRE) ×2 IMPLANT
HANDPIECE INTERPULSE COAX TIP (DISPOSABLE) ×1
IMPL REVERSE SHOULDER 0X3.5 (Shoulder) ×1 IMPLANT
IMPLANT REVERSE SHOULDER 0X3.5 (Shoulder) ×2 IMPLANT
INSERT REV SHOULDER 42X9 12-5 (Insert) ×2 IMPLANT
KIT STABILIZATION SHOULDER (MISCELLANEOUS) IMPLANT
MANIFOLD NEPTUNE II (INSTRUMENTS) ×2 IMPLANT
PACK BASIN DAY SURGERY FS (CUSTOM PROCEDURE TRAY) ×2 IMPLANT
PACK SHOULDER (CUSTOM PROCEDURE TRAY) ×2 IMPLANT
PAD COLD SHLDR WRAP-ON (PAD) ×2 IMPLANT
PAD ORTHO SHOULDER 7X19 LRG (SOFTGOODS) ×2 IMPLANT
PENCIL SMOKE EVACUATOR (MISCELLANEOUS) IMPLANT
RESTRAINT HEAD UNIVERSAL NS (MISCELLANEOUS) ×2 IMPLANT
SCREW 5.5X22 (Screw) ×2 IMPLANT
SCREW CENTRAL THREAD 6.5X45 (Screw) ×2 IMPLANT
SCREW PERIPHERAL 30 (Screw) ×2 IMPLANT
SET HNDPC FAN SPRY TIP SCT (DISPOSABLE) ×1 IMPLANT
SET SHOULDER TRAC (MISCELLANEOUS) ×1 IMPLANT
SET SHOULDER TRACTION (MISCELLANEOUS) ×2
SHEET MEDIUM DRAPE 40X70 STRL (DRAPES) ×2 IMPLANT
SLEEVE SCD COMPRESS KNEE MED (STOCKING) ×2 IMPLANT
SPONGE LAP 18X18 RF (DISPOSABLE) IMPLANT
STEM HUMERAL AEQUALIS 5BX82 (Stem) ×2 IMPLANT
SUT ETHIBOND 2 V 37 (SUTURE) ×2 IMPLANT
SUT ETHIBOND NAB CT1 #1 30IN (SUTURE) ×2 IMPLANT
SUT FIBERWIRE #5 38 CONV NDL (SUTURE) ×8
SUT MNCRL AB 4-0 PS2 18 (SUTURE) ×2 IMPLANT
SUT VIC AB 3-0 SH 27 (SUTURE) ×2
SUT VIC AB 3-0 SH 27X BRD (SUTURE) ×1 IMPLANT
SUTURE FIBERWR #5 38 CONV NDL (SUTURE) ×4 IMPLANT
SYR BULB IRRIG 60ML STRL (SYRINGE) IMPLANT
TOWEL GREEN STERILE FF (TOWEL DISPOSABLE) ×6 IMPLANT
TUBE SUCTION HIGH CAP CLEAR NV (SUCTIONS) ×2 IMPLANT

## 2020-09-08 NOTE — Anesthesia Procedure Notes (Signed)
Anesthesia Regional Block: Interscalene brachial plexus block   Pre-Anesthetic Checklist: ,, timeout performed, Correct Patient, Correct Site, Correct Laterality, Correct Procedure, Correct Position, site marked, Risks and benefits discussed,  Surgical consent,  Pre-op evaluation,  At surgeon's request and post-op pain management  Laterality: Right  Prep: chloraprep       Needles:  Injection technique: Single-shot  Needle Type: Stimulator Needle - 40      Needle Gauge: 22     Additional Needles:   Procedures:, nerve stimulator,,,,,,,  Narrative:  Start time: 09/08/2020 7:10 AM End time: 09/08/2020 7:20 AM Injection made incrementally with aspirations every 5 mL.  Performed by: Personally  Anesthesiologist: Roberts Gaudy, MD  Additional Notes: 20 cc 0.5% Bupivacaine 1:200 Epi 10 cc 1.3% Exparel

## 2020-09-08 NOTE — Transfer of Care (Signed)
Immediate Anesthesia Transfer of Care Note  Patient: Stephen Diaz  Procedure(s) Performed: REVERSE TOTAL SHOULDER ARTHROPLASTY (Right Shoulder)  Patient Location: PACU  Anesthesia Type:General and Regional  Level of Consciousness: awake  Airway & Oxygen Therapy: Patient Spontanous Breathing and Patient connected to face mask oxygen  Post-op Assessment: Report given to RN and Post -op Vital signs reviewed and stable  Post vital signs: Reviewed and stable  Last Vitals:  Vitals Value Taken Time  BP    Temp    Pulse 86 09/08/20 0901  Resp    SpO2 100 % 09/08/20 0901  Vitals shown include unvalidated device data.  Last Pain:  Vitals:   09/08/20 0640  TempSrc: Oral  PainSc: 5       Patients Stated Pain Goal: 3 (33/43/56 8616)  Complications: No complications documented.

## 2020-09-08 NOTE — Anesthesia Preprocedure Evaluation (Signed)
Anesthesia Evaluation  Patient identified by MRN, date of birth, ID band Patient awake    Reviewed: Allergy & Precautions, NPO status , Patient's Chart, lab work & pertinent test results  Airway Mallampati: II  TM Distance: >3 FB Neck ROM: Limited    Dental  (+) Edentulous Upper, Edentulous Lower   Pulmonary Current Smoker and Patient abstained from smoking.,    breath sounds clear to auscultation       Cardiovascular hypertension,  Rhythm:Regular Rate:Normal     Neuro/Psych    GI/Hepatic   Endo/Other  diabetes  Renal/GU      Musculoskeletal   Abdominal   Peds  Hematology   Anesthesia Other Findings   Reproductive/Obstetrics                             Anesthesia Physical Anesthesia Plan  ASA: III  Anesthesia Plan: General   Post-op Pain Management:  Regional for Post-op pain   Induction: Intravenous  PONV Risk Score and Plan: Ondansetron and Dexamethasone  Airway Management Planned: Oral ETT  Additional Equipment:   Intra-op Plan:   Post-operative Plan: Extubation in OR  Informed Consent: I have reviewed the patients History and Physical, chart, labs and discussed the procedure including the risks, benefits and alternatives for the proposed anesthesia with the patient or authorized representative who has indicated his/her understanding and acceptance.       Plan Discussed with: CRNA and Anesthesiologist  Anesthesia Plan Comments:         Anesthesia Quick Evaluation

## 2020-09-08 NOTE — Anesthesia Postprocedure Evaluation (Signed)
Anesthesia Post Note  Patient: Stephen Diaz  Procedure(s) Performed: REVERSE TOTAL SHOULDER ARTHROPLASTY (Right Shoulder)     Patient location during evaluation: PACU Anesthesia Type: General Level of consciousness: awake and alert Pain management: pain level controlled Vital Signs Assessment: post-procedure vital signs reviewed and stable Respiratory status: spontaneous breathing, nonlabored ventilation, respiratory function stable and patient connected to nasal cannula oxygen Cardiovascular status: blood pressure returned to baseline and stable Postop Assessment: no apparent nausea or vomiting Anesthetic complications: no   No complications documented.  Last Vitals:  Vitals:   09/08/20 1330 09/08/20 1459  BP: (P) 125/81   Pulse: (P) 86 90  Resp: (P) 18   Temp:    SpO2: (P) 95% 96%    Last Pain:  Vitals:   09/08/20 1459  TempSrc:   PainSc: 0-No pain                 [name]

## 2020-09-08 NOTE — Op Note (Signed)
Orthopaedic Surgery Operative Note (CSN: [id number])  Stephen Diaz  02-May-1956 Date of Surgery: 09/08/2020   Diagnoses:  Right shoulder rotator cuff irreparable tear  Procedure: Right reverse total Shoulder Arthroplasty   Operative Finding Successful completion of planned procedure.  Patient subscapularis, supraspinatus insertion as was irreparably torn and there was very little in the way of subscapularis tissue that was still available.  We did find intact the remnant but we did not perform an inferior release.  We did perform a tug test on the axillary nerve and found that it was intact before and after the case.  We placed a larger glenosphere than his typical just to minimize the chance of dislocation in this patient with very little anterior capsular tissue and really very little subscapularis as well.  Teres minor was marginally intact.  Post-operative plan: The patient will be NWB in sling.  The patient will be will be admitted to observation due to medical complexity, monitoring and pain management.  DVT prophylaxis Aspirin 81 mg twice daily for 6 weeks.  Pain control with PRN pain medication preferring oral medicines.  Follow up plan will be scheduled in approximately 7 days for incision check and XR.  Physical therapy to start after first visit.  Implants: Tornier size 5B flex stem, 0 high offset tray with 9 mm 42 mm poly-, 29 baseplate with 42 glenosphere and 45 center screw  Post-Op Diagnosis: Same Surgeons:Primary: Hiram Gash, MD Assistants:Caroline McBane PA-C Location: [facility] OR ROOM 6 Anesthesia: General with Exparel Interscalene Antibiotics: Ancef 2g preop, Vancomycin 1000mg  locally Tourniquet time: None Estimated Blood Loss: 024 Complications: None Specimens: None Implants: Implant Name Type Inv. Item Serial No. Manufacturer Lot No. LRB No. Used Action  BASEPLATE REV SHOULDER 09BD OD - [id number] Plate BASEPLATE REV SHOULDER 83MH OD  [employer] [id number] Right 1  Implanted  BASEPLATE GLENOID STD REV 42 - [id number] Joint BASEPLATE GLENOID STD REV 42  [employer] [id number] Right 1 Implanted  IMPLANT REVERSE SHOULDER 0X3.5 - [id number] Shoulder IMPLANT REVERSE SHOULDER 0X3.[place] [id number] Right 1 Implanted  STEM [place] 5BX82 - [id number] Stem STEM [employer] [id number] Right 1 Implanted  Flex shoulder system reversed insert   [id number]  [id number] Right 1 Implanted    Indications for Surgery:   Stephen Diaz is a 65 y.o. male with irreparable rotator cuff tear noted on MRI and significant dysfunction.  Benefits and risks of operative and nonoperative management were discussed prior to surgery with patient/guardian(s) and informed consent form was completed.  Infection and need for further surgery were discussed as was prosthetic stability and cuff issues.  We additionally specifically discussed risks of axillary nerve injury, infection, periprosthetic fracture, continued pain and longevity of implants prior to beginning procedure.      Procedure:   The patient was identified in the preoperative holding area where the surgical site was marked. Block placed by anesthesia with exparel.  The patient was taken to the OR where a procedural timeout was called and the above noted anesthesia was induced.  The patient was positioned beachchair on allen table with spider arm positioner.  Preoperative antibiotics were dosed.  The patient's right shoulder was prepped and draped in the usual sterile fashion.  A second preoperative timeout was called.       Standard deltopectoral approach was performed with a #10 blade. We dissected down to the subcutaneous tissues and the cephalic vein was taken laterally with the deltoid. Clavipectoral  fascia was incised in line with the incision. Deep retractors were placed. The long of the biceps tendon was identified and there was significant tenosynovitis present.  Tenodesis was performed to  the pectoralis tendon with #2 Ethibond. The remaining biceps was followed up into the rotator interval where it was released.   The subscapularis remnant was taken down in a full thickness layer with capsule along the humeral neck extending inferiorly around the humeral head.  There was very little superior tissue left however there is some small inferior tissue that was contiguous with the inferior capsule.  We continued releasing the capsule directly off of the osteophytes inferiorly all the way around the corner. This allowed [place] to dislocate the humeral head.   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  The rotator cuff was carefully examined and noted to be irreperably torn.  The decision was confirmed that a reverse total shoulder was indicated for this patient.  There were osteophytes along the inferior humeral neck. The osteophytes were removed with an osteotome and a rongeur.  Osteophytes were removed with a rongeur and an osteotome and the anatomic neck was well visualized.     A humeral cutting guide was inserted down the intramedullary canal. The version was set at 20 of retroversion. Humeral osteotomy was performed with an oscillating saw. The head fragment was passed off the back table. A starter awl was used to open the humeral canal. We next used T-handle straight sound reamers to ream up to an appropriate fit. A chisel was used to remove proximal humeral bone. We then broached starting with a size one broach and broaching up to 5 which obtained an appropriate fit. The broach handle was removed. A cut protector was placed. The broach handle was removed and a cut protector was placed. The humerus was retracted posteriorly and we turned our attention to glenoid exposure.  The subscapularis was again identified and immediately we  took care to palpate the axillary nerve anteriorly and verify its position with gentle palpation as well as the tug test.  We then released the SGHL with bovie cautery prior to placing a curved mayo at the junction of the anterior glenoid well above the axillary nerve and bluntly dissecting the subscapularis from the capsule.  We then carefully protected the axillary nerve as we gently released the inferior capsule to fully mobilize the subscapularis.  An anterior deltoid retractor was then placed as well as a small Hohmann retractor superiorly.   The glenoid cartilage was relatively intact in the setting of this cuff tear case  The glenoid drill guide was placed and used to drill a guide pin in the center, inferior position. The glenoid face was then reamed concentrically over the guide wire. The center hole was drilled over the guidepin in a near anatomic angle of version. Next the  glenoid vault was drilled back to a depth of 45 mm.  We tapped and then placed a 57mm size baseplate with 44mm lateralization was selected with a 6.5 mm x 45 mm length central screw.  The base plate was screwed into the glenoid vault obtaining secure fixation. We next placed superior and inferior locking screws for additional fixation.  Next a 42 mm glenosphere was selected and impacted onto the baseplate. The center screw was tightened.  We turned attention back to the humeral side. The cut  protector was removed. We trialed with multiple size tray and polyethylene options and selected a 9 which provided good stability and range of motion without excess soft tissue tension. The offset was dialed in to match the normal anatomy. The shoulder was trialed.  There was good ROM in all planes and the shoulder was stable with no inferior translation.  The real humeral implants were opened after again confirming sizes.  The trial was removed. #5 Fiberwire x4 sutures passed through the humeral neck for subscap repair. The humeral  component was press-fit obtaining a secure fit. A +0 high offset tray was selected and impacted onto the stem.  A 36+6 polyethylene liner was impacted onto the stem.  The joint was reduced and thoroughly irrigated with pulsatile lavage. Subscap was repaired back with #5 Fiberwire sutures through bone tunnels. Hemostasis was obtained. The deltopectoral interval was reapproximated with #1 Ethibond. The subcutaneous tissues were closed with 2-0 Vicryl and the skin was closed with running monocryl.    The wounds were cleaned and dried and an Aquacel dressing was placed. The drapes taken down. The arm was placed into sling with abduction pillow. Patient was awakened, extubated, and transferred to the recovery room in stable condition. There were no intraoperative complications. The sponge, needle, and attention counts were  correct at the end of the case.       Noemi Chapel, PA-C, present and scrubbed throughout the case, critical for completion in a timely fashion, and for retraction, instrumentation, closure.

## 2020-09-08 NOTE — Progress Notes (Signed)
Assisted Dr. Linna Caprice with right, ultrasound guided, interscalene  block. Side rails up, monitors on throughout procedure. See vital signs in flow sheet. Tolerated Procedure well.

## 2020-09-08 NOTE — Anesthesia Procedure Notes (Signed)
Procedure Name: Intubation Date/Time: 09/08/2020 7:39 AM Performed by: Lieutenant Diego, CRNA Pre-anesthesia Checklist: Patient identified, Emergency Drugs available, Suction available and Patient being monitored Patient Re-evaluated:Patient Re-evaluated prior to induction Oxygen Delivery Method: Circle system utilized Preoxygenation: Pre-oxygenation with 100% oxygen Induction Type: IV induction Ventilation: Mask ventilation without difficulty Laryngoscope Size: Miller and 2 Grade View: Grade I Tube type: Oral Tube size: 7.5 mm Number of attempts: 1 Airway Equipment and Method: Stylet and Oral airway Placement Confirmation: ETT inserted through vocal cords under direct vision,  positive ETCO2 and breath sounds checked- equal and bilateral Secured at: 24 cm Tube secured with: Tape Dental Injury: Teeth and Oropharynx as per pre-operative assessment

## 2020-09-09 DIAGNOSIS — M19011 Primary osteoarthritis, right shoulder: Secondary | ICD-10-CM | POA: Diagnosis not present

## 2020-09-09 MED ORDER — ACETAMINOPHEN 500 MG PO TABS: 1000.0000 mg | ORAL_TABLET | Freq: Three times a day (TID) | ORAL | 0 refills | Status: AC

## 2020-09-09 MED ORDER — ALBUTEROL SULFATE HFA 108 (90 BASE) MCG/ACT IN AERS
INHALATION_SPRAY | RESPIRATORY_TRACT | Status: AC
  Filled 2020-09-09: qty 6.7

## 2020-09-09 MED ORDER — CELECOXIB 200 MG PO CAPS: 200.0000 mg | ORAL_CAPSULE | Freq: Two times a day (BID) | ORAL | 0 refills | Status: AC

## 2020-09-09 MED ORDER — OXYCODONE HCL 5 MG PO TABS: ORAL_TABLET | ORAL | 0 refills | Status: AC

## 2020-09-09 MED ORDER — OMEPRAZOLE 20 MG PO CPDR: 20.0000 mg | DELAYED_RELEASE_CAPSULE | Freq: Every day | ORAL | 0 refills | Status: DC

## 2020-09-09 MED ORDER — ONDANSETRON HCL 4 MG PO TABS: 4.0000 mg | ORAL_TABLET | Freq: Three times a day (TID) | ORAL | 0 refills | Status: AC | PRN

## 2020-09-09 MED ORDER — ASPIRIN 81 MG PO CHEW: 81.0000 mg | CHEWABLE_TABLET | Freq: Two times a day (BID) | ORAL | 0 refills | Status: AC

## 2020-09-09 NOTE — Discharge Instructions (Signed)
Ophelia Charter MD, MPH Noemi Chapel, PA-C [place] [address], [address] [phone] (tel)   [phone] (fax)   [place] [facility]    WOUND CARE ? You may leave the operative dressing in place until your follow-up appointment. ? KEEP THE INCISIONS CLEAN AND DRY. ? There may be a small amount of fluid/bleeding leaking at the surgical site. This is normal after surgery.  ? If it fills with liquid or blood please call us immediately to change it for you. ? Use the provided ice machine or Ice packs as often as possible for the first 3-4 days, then as needed for pain relief.   o Keep a layer of cloth or a shirt between your skin and the cooling unit to prevent frost bite as it can get very cold.  SHOWERING: - You may shower on Post-Op Day #2.  - The dressing is water resistant but do not scrub it as it may start to peel up.   - You may remove the sling for showering, but keep a water resistant pillow under the arm to keep both the  elbow and shoulder away from the body (mimicking the abduction sling).  - Gently pat the area dry.  - Do not soak the shoulder in water. Do not go swimming in the pool or ocean until 4 weeks after surgery - [place].  EXERCISES ? Wear the sling at all times except when doing your exercises.  ? You may remove the sling for showering, but keep the arm across the chest or in a secondary sling.    ? Accidental/Purposeful External Rotation and shoulder flexion (reaching behind you) is to be avoided at all costs for the first month. ? It is ok to come out of your sling if your are sitting and have assistance for eating.   ? Do not lift anything heavier than 1 pound until we discuss it further in clinic.  REGIONAL ANESTHESIA (NERVE BLOCKS) . The anesthesia team may have performed a nerve block for you if safe in the setting of your care.  This is a great tool used to minimize  pain.  Typically the block may start wearing off overnight but the long acting medicine may last for 3-4 days.  The nerve block wearing off can be a challenging period but please utilize your as needed pain medications to try and manage this period.    POST-OP MEDICATIONS- Multimodal approach to pain control . In general your pain will be controlled with a combination of substances.  Prescriptions unless otherwise discussed are electronically sent to your pharmacy.  This is a carefully made plan we use to minimize narcotic use.     ? Celebrex - Anti-inflammatory medication taken on a scheduled basis - Take 1 tablet twice a day ? Acetaminophen - Non-narcotic pain medicine taken on a scheduled basis  - Take two 500 mg tablets (1,000 mg total) every 8 hours ? Oxycodone - This is a strong narcotic, to be used only on an "as needed" basis for pain. ? Aspirin 81mg  - This medicine is used to minimize the risk of blood clots after surgery. - Take 1 tablet twice a day for 6 weeks ? Omeprazole - daily medicine to protect your stomach while taking anti-inflammatories.  This may only be used in higher risk patients. ? Zofran -  take as needed for nausea  FOLLOW-UP ? If you develop a Fever (>101.5), Redness  or Drainage from the surgical incision site, please call our office to arrange for an evaluation. ? Please call the office to schedule a follow-up appointment for a wound check, 7-10 days post-operatively.  IF YOU HAVE ANY QUESTIONS, PLEASE FEEL FREE TO CALL OUR OFFICE.  HELPFUL INFORMATION  . If you had a block, it will wear off between 8-24 hrs postop typically.  This is period when your pain may go from nearly zero to the pain you would have had post-op without the block.  This is an abrupt transition but nothing dangerous is happening.  You may take an extra dose of narcotic when this happens.  ? Your arm will be in a sling following surgery. You will be in this sling for the next 3-4 weeks.  I  will let you know the exact duration at your follow-up visit.  ? You may be more comfortable sleeping in a semi-seated position the first few nights following surgery.  Keep a pillow propped under the elbow and forearm for comfort.  If you have a recliner type of chair it might be beneficial.  If not that is fine too, but it would be helpful to sleep propped up with pillows behind your operated shoulder as well under your elbow and forearm.  This will reduce pulling on the suture lines.  ? When dressing, put your operative arm in the sleeve first.  When getting undressed, take your operative arm out last.  Loose fitting, button-down shirts are recommended.  ? In most states it is against the law to drive while your arm is in a sling. And certainly against the law to drive while taking narcotics.  ? You may return to work/school in the next couple of days when you feel up to it. Desk work and typing in the sling is fine.  ? We suggest you use the pain medication the first night prior to going to bed, in order to ease any pain when the anesthesia wears off. You should avoid taking pain medications on an empty stomach as it will make you nauseous.  ? Do not drink alcoholic beverages or take illicit drugs when taking pain medications.  ? Pain medication may make you constipated.  Below are a few solutions to try in this order: - Decrease the amount of pain medication if you aren't having pain. - Drink lots of decaffeinated fluids. - Drink prune juice and/or each dried prunes  o If the first 3 don't work start with additional solutions - Take Colace - an over-the-counter stool softener - Take Senokot - an over-the-counter laxative - Take Miralax - a stronger over-the-counter laxative   Dental Antibiotics:  In most cases prophylactic antibiotics for Dental procdeures after total joint surgery are not necessary.  Exceptions are as follows:  1. History of prior total joint infection  2.  Severely immunocompromised (Organ Transplant, cancer chemotherapy, Rheumatoid biologic meds such as [place])  3. Poorly controlled diabetes (A1C &gt; 8.0, blood glucose over 200)  If you have one of these conditions, contact your surgeon for an antibiotic prescription, prior to your dental procedure.   For more information including helpful videos and documents visit our website:   [email or web address]

## 2020-09-10 ENCOUNTER — Encounter (HOSPITAL_BASED_OUTPATIENT_CLINIC_OR_DEPARTMENT_OTHER): Payer: Self-pay | Admitting: Orthopaedic Surgery

## 2020-09-10 NOTE — Discharge Summary (Signed)
Patient ID: Stephen Diaz MRN: [id number] DOB/AGE: 1956-06-10 65 y.o.  Admit date: 09/08/2020 Discharge date: 09/10/2020  Admission Diagnoses: Right shoulder rotator cuff irreparable tear  Discharge Diagnoses:  Active Problems:   Status post reverse total arthroplasty of right shoulder   Past Medical History:  Diagnosis Date  . Arthritis    right shoulder  . COPD (chronic obstructive pulmonary disease) ([place])   . Diabetes mellitus without complication ([place])   . Hypertension   . Right foot drop    from pinched nerve in back, wears brace     Procedures Performed: Right reverse total Shoulder Arthroplasty  Discharged Condition: good/stable  Hospital Course: Patient brought in to [facility] for scheduled procedure.  He tolerated procedure well.  He was kept for monitoring overnight for pain control and medical monitoring postop. He was found to be stable for DC home the morning after surgery.  Patient was instructed on specific activity restrictions and all questions were answered.   Consults: None  Significant Diagnostic Studies: No additional pertinent studies  Treatments: Surgery  Discharge Exam: General: sitting up in hospital bed, NAD Cardiac: regular rate Pulmonary: no increased work of breathing RUE: Dressing CDI and sling well fitting.  Axillary nerve sensation/motor altered in setting of block and unable to be fully tested.  Distal motor and sensory altered in setting of block.   Disposition: Discharge disposition: 01-Home or Self Care       Discharge Instructions    Call MD for:  redness, tenderness, or signs of infection (pain, swelling, redness, odor or green/yellow discharge around incision site)   Complete by: As directed    Call MD for:  severe uncontrolled pain   Complete by: As directed    Call MD for:  temperature >100.4   Complete by: As directed    Diet - low sodium heart healthy   Complete by: As directed    Increase  activity slowly   Complete by: As directed      Allergies as of 09/09/2020   No Known Allergies     Medication List    STOP taking these medications   aspirin EC 81 MG tablet Replaced by: aspirin 81 MG chewable tablet     TAKE these medications   acetaminophen 500 MG tablet Commonly known as: TYLENOL Take 2 tablets (1,000 mg total) by mouth every 8 (eight) hours for 14 days.   albuterol 108 (90 Base) MCG/ACT inhaler Commonly known as: VENTOLIN HFA Inhale 2 puffs into the lungs every 6 (six) hours as needed for wheezing or shortness of breath.   aspirin 81 MG chewable tablet Commonly known as: Aspirin Childrens Chew 1 tablet (81 mg total) by mouth 2 (two) times daily. For DVT prophylaxis after surgery Replaces: aspirin EC 81 MG tablet   celecoxib 200 MG capsule Commonly known as: CeleBREX Take 1 capsule (200 mg total) by mouth 2 (two) times daily. For 2 weeks. Then as needed for pain   lisinopril 10 MG tablet Commonly known as: ZESTRIL Take 10 mg by mouth daily.   metFORMIN 500 MG tablet Commonly known as: GLUCOPHAGE Take 1,000 mg by mouth daily with breakfast.   omeprazole 20 MG capsule Commonly known as: PRILOSEC Take 1 capsule (20 mg total) by mouth daily. To gastric protection while taking NSAIDs   ondansetron 4 MG tablet Commonly known as: Zofran Take 1 tablet (4 mg total) by mouth every 8 (eight) hours as needed for up to 7 days for nausea  or vomiting.   oxyCODONE 5 MG immediate release tablet Commonly known as: Oxy IR/ROXICODONE Take 1-2 pills every 6 hrs as needed for severe pain, no more than 6 per day   Symbicort 160-4.5 MCG/ACT inhaler Generic drug: budesonide-formoterol Inhale 2 puffs into the lungs 2 (two) times daily.        Noemi Chapel, PA-C 09/09/2020

## 2020-09-22 ENCOUNTER — Encounter: Payer: Self-pay | Admitting: Physical Therapy

## 2020-09-22 ENCOUNTER — Ambulatory Visit: Payer: Medicare Other | Attending: Orthopaedic Surgery | Admitting: Physical Therapy

## 2020-09-22 ENCOUNTER — Other Ambulatory Visit: Payer: Self-pay

## 2020-09-22 DIAGNOSIS — M6281 Muscle weakness (generalized): Secondary | ICD-10-CM | POA: Diagnosis present

## 2020-09-22 DIAGNOSIS — M25611 Stiffness of right shoulder, not elsewhere classified: Secondary | ICD-10-CM | POA: Diagnosis not present

## 2020-09-22 DIAGNOSIS — M25511 Pain in right shoulder: Secondary | ICD-10-CM | POA: Insufficient documentation

## 2020-09-22 DIAGNOSIS — R600 Localized edema: Secondary | ICD-10-CM | POA: Diagnosis present

## 2020-09-22 NOTE — Therapy (Signed)
[facility] [place], [place], [address] Phone: [phone]   Fax:  [phone]  Physical Therapy Evaluation  Patient Details  Name: Stephen Diaz MRN: [id number] Date of Birth: Mar 01, 1956 Referring Provider (PT): Ophelia Charter, MD   Encounter Date: 09/22/2020   PT End of Session - 09/22/20 1259    Visit Number 1    Date for PT Re-Evaluation 12/15/20    Authorization Type MCR/MCD; [facility] and Progress note every 10th visit; Kx after 15    Progress Note Due on Visit 10    PT Start Time 1300    PT Stop Time 1352    PT Time Calculation (min) 52 min    Activity Tolerance Patient tolerated treatment well    Behavior During Therapy [facility] for tasks assessed/performed           Past Medical History:  Diagnosis Date  . Arthritis    right shoulder  . COPD (chronic obstructive pulmonary disease) ([place])   . Diabetes mellitus without complication ([place])   . Hypertension   . Right foot drop    from pinched nerve in back, wears brace    Past Surgical History:  Procedure Laterality Date  . CATARACT EXTRACTION Left   . CATARACT EXTRACTION W/PHACO Right 08/13/2020   Procedure: CATARACT EXTRACTION PHACO AND INTRAOCULAR LENS PLACEMENT (IOC);  Surgeon: Baruch Goldmann, MD;  Location: [facility];  Service: Ophthalmology;  Laterality: Right;  CDE: 8.24  . HERNIA REPAIR    . REVERSE SHOULDER ARTHROPLASTY Right 09/08/2020   Procedure: REVERSE TOTAL SHOULDER ARTHROPLASTY;  Surgeon: Hiram Gash, MD;  Location: [facility];  Service: Orthopedics;  Laterality: Right;  . screws and steel plate in neck  (P9-5)     10-12 years ago  x2    There were no vitals filed for this visit.    Subjective Assessment - 09/22/20 1326    Subjective Patient is s/p right reverse TSA on 09/08/20. He don's a sling at all times unless sitting in a chair.    Patient Stated Goals get it back to normal    Currently in Pain? Yes    Pain Score 6     Pain  Location Shoulder    Pain Orientation Right    Pain Descriptors / Indicators Dull    Pain Type Surgical pain    Pain Radiating Towards deltoid    Pain Onset 1 to 4 weeks ago    Pain Frequency Constant              [facility] PT Assessment - 09/22/20 0001      Assessment   Medical Diagnosis right reverse TSA    Referring Provider (PT) Ophelia Charter, MD    Onset Date/Surgical Date 09/08/20    Hand Dominance Right    Next MD Visit 10/08/20    Prior Therapy no      Precautions   Precautions Shoulder    Precaution Comments avoid any IR and extension; no resisted IR/ext x 3 months      Restrictions   Weight Bearing Restrictions No      Balance Screen   Has the patient fallen in the past 6 months No    Has the patient had a decrease in activity level because of a fear of falling?  No    Is the patient reluctant to leave their home because of a fear of falling?  No      Home Environment   Living Environment  Private residence    Living Arrangements Alone      Prior Function   Level of [place] Retired    Leisure fishing      Observation/Other Assessments   Observations significant winging of right scapula      ROM / Strength   AROM / PROM / Strength PROM;AROM      AROM   Overall AROM Comments active elbow ext -13 deg      PROM   Overall PROM Comments right elbow -4 deg ext    PROM Assessment Site Shoulder    Right/Left Shoulder Right    Right Shoulder Flexion 90 Degrees    Right Shoulder ABduction 70 Degrees    Right Shoulder Internal Rotation --   to stomach   Right Shoulder External Rotation 20 Degrees      Palpation   Palpation comment right lats and upper arm tender                      Objective measurements completed on examination: See above findings.       [facility] Adult PT Treatment/Exercise - 09/22/20 0001      Self-Care   Self-Care Other Self-Care Comments    Other Self-Care Comments  see pt education       Modalities   Modalities Vasopneumatic      Vasopneumatic   Number Minutes Vasopneumatic  10 minutes    Vasopnuematic Location  Shoulder    Vasopneumatic Pressure Low    Vasopneumatic Temperature  34      Manual Therapy   Manual Therapy Passive ROM    Passive ROM to right shoulder into ER and flexion                  PT Education - 09/22/20 1501    Education Details Active elbow extension; seated isometrics below shoulder level (add/abd/ext); self care: ideas for puttiing hair up; precautions and icing reviewed.    Person(s) Educated Patient    Methods Explanation;Demonstration    Comprehension Verbalized understanding;Returned demonstration               PT Long Term Goals - 09/22/20 1451      PT LONG TERM GOAL #1   Title Ind with advanced HEP for ROM and strengthening to improve function.    Time 12    Period Weeks    Status New    Target Date 12/15/20      PT LONG TERM GOAL #2   Title Improved right shoulder ROM to [facility] to perform ADLS within restrictions.    Time 12    Period Weeks    Status New      PT LONG TERM GOAL #3   Title Pt able to perform ADLS with functional R shoulder strength.    Time 12    Period Weeks    Status New      PT LONG TERM GOAL #4   Title Decreased pain in right shoulder by >= 80% with ADLS.    Time 12    Period Weeks    Status New      PT LONG TERM GOAL #5   Title Improved FOTO to 63    Baseline 32 at eval    Time 12    Period Weeks    Status New                  Plan - 09/22/20 1349  Clinical Impression Statement Patient presents s/p right reverse TSA on 09/08/20. He is limited with ROM and strength and has constant pain at 6/10 at this time. He wears his sling except when sitting at home. He tolerated PROM well today and had normal response to VASO at end of session. Precautions were reviewed, but pt needed to be reminded several times not to move his shoulder actively. He lives alone. We discussed how he  can tie up his long hair without reaching behind his head by leaning forward. Pt will benefit from skilled PT to address the above deficits and return him to his PLOF.    Examination-Activity Limitations Hygiene/Grooming   pt with long hair. unable to tie back.   Examination-Participation Restrictions Cleaning;Yard Work    Stability/Clinical Decision Making Stable/Uncomplicated    Designer, jewellery Low    Rehab Potential Excellent    PT Frequency 2x / week    PT Duration 12 weeks    PT Treatment/Interventions ADLs/Self Care Home Management;Cryotherapy;Electrical Stimulation;Therapeutic exercise;Neuromuscular re-education;Patient/family education;Manual techniques;Dry needling;Passive range of motion;Vasopneumatic Device    PT Next Visit Plan progress HEP PROM/AAROM within protocol goals (30 deg ER and 120 deg flex at 4 weeks). Review seated isometrics below shoulder level. NO IR OR EXT x 12 wks    PT Home Exercise Plan cont with ball squeeze; active elbow exension, seated isometrics below shoulder level (ADD/ABD/ext)    Consulted and Agree with Plan of Care Patient           Patient will benefit from skilled therapeutic intervention in order to improve the following deficits and impairments:  Decreased range of motion,Impaired UE functional use,Pain,Decreased strength,Increased edema,Postural dysfunction  Visit Diagnosis: Stiffness of right shoulder, not elsewhere classified - Plan: PT plan of care cert/re-cert  Acute pain of right shoulder - Plan: PT plan of care cert/re-cert  Muscle weakness (generalized) - Plan: PT plan of care cert/re-cert  Localized edema - Plan: PT plan of care cert/re-cert     Problem List Patient Active Problem List   Diagnosis Date Noted  . 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/08/2020    Madelyn Flavors PT 09/22/2020, 3:05 PM  [facility] [place], [place],  [address] Phone: [phone]   Fax:  [phone]  Name: TYREN DUGAR MRN: [id number] Date of Birth: March 30, 1956

## 2020-09-23 ENCOUNTER — Ambulatory Visit: Payer: Medicare Other | Admitting: Physical Therapy

## 2020-09-23 DIAGNOSIS — M25611 Stiffness of right shoulder, not elsewhere classified: Secondary | ICD-10-CM

## 2020-09-23 DIAGNOSIS — R600 Localized edema: Secondary | ICD-10-CM

## 2020-09-23 DIAGNOSIS — M25511 Pain in right shoulder: Secondary | ICD-10-CM

## 2020-09-23 DIAGNOSIS — M6281 Muscle weakness (generalized): Secondary | ICD-10-CM

## 2020-09-23 NOTE — Therapy (Signed)
[facility] [place], [place], [address] Phone: [phone]   Fax:  [phone]  Physical Therapy Treatment  Patient Details  Name: Stephen Diaz MRN: [id number] Date of Birth: 1956/04/10 Referring Provider (PT): Ophelia Charter, MD   Encounter Date: 09/23/2020   PT End of Session - 09/23/20 0901    Visit Number 2    Date for PT Re-Evaluation 12/15/20    Authorization Type MCR/MCD; [facility] and Progress note every 10th visit; Kx after 15    PT Start Time 0900    PT Stop Time 0942    PT Time Calculation (min) 42 min    Activity Tolerance Patient tolerated treatment well    Behavior During Therapy [facility] for tasks assessed/performed           Past Medical History:  Diagnosis Date  . Arthritis    right shoulder  . COPD (chronic obstructive pulmonary disease) ([place])   . Diabetes mellitus without complication ([place])   . Hypertension   . Right foot drop    from pinched nerve in back, wears brace    Past Surgical History:  Procedure Laterality Date  . CATARACT EXTRACTION Left   . CATARACT EXTRACTION W/PHACO Right 08/13/2020   Procedure: CATARACT EXTRACTION PHACO AND INTRAOCULAR LENS PLACEMENT (IOC);  Surgeon: Baruch Goldmann, MD;  [facility]: [facility];  Service: Ophthalmology;  Laterality: Right;  CDE: 8.24  . HERNIA REPAIR    . REVERSE SHOULDER ARTHROPLASTY Right 09/08/2020   Procedure: REVERSE TOTAL SHOULDER ARTHROPLASTY;  Surgeon: Hiram Gash, MD;  Location: [facility];  Service: Orthopedics;  Laterality: Right;  . screws and steel plate in neck  (N8-6)     10-12 years ago  x2    There were no vitals filed for this visit.                      [facility] Adult PT Treatment/Exercise - 09/23/20 0001      Vasopneumatic   Number Minutes Vasopneumatic  10 minutes    Vasopnuematic Location  Shoulder    Vasopneumatic Pressure Low    Vasopneumatic Temperature  34      Manual Therapy   Manual Therapy  Passive ROM    Passive ROM manual ROM for right shoulder flexion and ER with gentle range within protocol limitations                  PT Education - 09/23/20 0934    Education Details HEP sheet    Person(s) Educated Patient    Methods Explanation;Demonstration;Handout    Comprehension Verbalized understanding;Returned demonstration               PT Long Term Goals - 09/22/20 1451      PT LONG TERM GOAL #1   Title Ind with advanced HEP for ROM and strengthening to improve function.    Time 12    Period Weeks    Status New    Target Date 12/15/20      PT LONG TERM GOAL #2   Title Improved right shoulder ROM to [facility] to perform ADLS within restrictions.    Time 12    Period Weeks    Status New      PT LONG TERM GOAL #3   Title Pt able to perform ADLS with functional R shoulder strength.    Time 12    Period Weeks    Status New      PT  LONG TERM GOAL #4   Title Decreased pain in right shoulder by >= 80% with ADLS.    Time 12    Period Weeks    Status New      PT LONG TERM GOAL #5   Title Improved FOTO to 63    Baseline 32 at eval    Time 12    Period Weeks    Status New                 Plan - 09/23/20 0943    Clinical Impression Statement Patient tolerated treatment well today. Patient reported little soreness in shoulder. Patient did well with PROM today and with smooth arch range. Patient ROM within protocol limitatins. Patient issued HEP isometric sheet per PT. Patient goals progresisng due to healing/protocol.    Examination-Activity Limitations Hygiene/Grooming    Examination-Participation Restrictions Cleaning;Yard Work    Stability/Clinical Decision Making Stable/Uncomplicated    Rehab Potential Excellent    PT Frequency 2x / week    PT Duration 12 weeks    PT Treatment/Interventions ADLs/Self Care Home Management;Cryotherapy;Electrical Stimulation;Therapeutic exercise;Neuromuscular re-education;Patient/family education;Manual  techniques;Dry needling;Passive range of motion;Vasopneumatic Device    PT Next Visit Plan progress HEP PROM/AAROM within protocol goals (30 deg ER and 120 deg flex at 4 weeks). Review seated isometrics below shoulder level. NO IR OR EXT x 12 wks    Consulted and Agree with Plan of Care Patient           Patient will benefit from skilled therapeutic intervention in order to improve the following deficits and impairments:  Decreased range of motion,Impaired UE functional use,Pain,Decreased strength,Increased edema,Postural dysfunction  Visit Diagnosis: Acute pain of right shoulder  Stiffness of right shoulder, not elsewhere classified  Muscle weakness (generalized)  Localized edema     Problem List Patient Active Problem List   Diagnosis Date Noted  . 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/08/2020    Phillips Climes, PTA 09/23/2020, 9:47 AM  [facility] [place], [place], [address] Phone: [phone]   Fax:  [phone]  Name: Stephen Diaz MRN: [id number] Date of Birth: 1956/03/17

## 2020-09-23 NOTE — Therapy (Signed)
[facility] [place], [place], [address] Phone: [phone]   Fax:  [phone]  Physical Therapy Treatment  Patient Details  Name: Stephen Diaz MRN: [id number] Date of Birth: 09-10-1955 Referring Provider (PT): Ophelia Charter, MD   Encounter Date: 09/23/2020   PT End of Session - 09/23/20 0901    Visit Number 2    Date for PT Re-Evaluation 12/15/20    Authorization Type MCR/MCD; [facility] and Progress note every 10th visit; Kx after 15    PT Start Time 0900    PT Stop Time 0942    PT Time Calculation (min) 42 min    Activity Tolerance Patient tolerated treatment well    Behavior During Therapy [facility] for tasks assessed/performed           Past Medical History:  Diagnosis Date  . Arthritis    right shoulder  . COPD (chronic obstructive pulmonary disease) ([place])   . Diabetes mellitus without complication ([place])   . Hypertension   . Right foot drop    from pinched nerve in back, wears brace    Past Surgical History:  Procedure Laterality Date  . CATARACT EXTRACTION Left   . CATARACT EXTRACTION W/PHACO Right 08/13/2020   Procedure: CATARACT EXTRACTION PHACO AND INTRAOCULAR LENS PLACEMENT (IOC);  Surgeon: Baruch Goldmann, MD;  Location: [facility];  Service: Ophthalmology;  Laterality: Right;  CDE: 8.24  . HERNIA REPAIR    . REVERSE SHOULDER ARTHROPLASTY Right 09/08/2020   Procedure: REVERSE TOTAL SHOULDER ARTHROPLASTY;  Surgeon: Hiram Gash, MD;  Location: [facility];  Service: Orthopedics;  Laterality: Right;  . screws and steel plate in neck  (L9-3)     10-12 years ago  x2    There were no vitals filed for this visit.                      [facility] Adult PT Treatment/Exercise - 09/23/20 0001      Vasopneumatic   Number Minutes Vasopneumatic  10 minutes    Vasopnuematic Location  Shoulder    Vasopneumatic Pressure Low    Vasopneumatic Temperature  34      Manual Therapy   Manual Therapy  Passive ROM    Passive ROM manual ROM for right shoulder flexion and ER with gentle range within protocol limitations                  PT Education - 09/23/20 0934    Education Details HEP sheet    Person(s) Educated Patient    Methods Explanation;Demonstration;Handout    Comprehension Verbalized understanding;Returned demonstration               PT Long Term Goals - 09/22/20 1451      PT LONG TERM GOAL #1   Title Ind with advanced HEP for ROM and strengthening to improve function.    Time 12    Period Weeks    Status New    Target Date 12/15/20      PT LONG TERM GOAL #2   Title Improved right shoulder ROM to [facility] to perform ADLS within restrictions.    Time 12    Period Weeks    Status New      PT LONG TERM GOAL #3   Title Pt able to perform ADLS with functional R shoulder strength.    Time 12    Period Weeks    Status New      PT  LONG TERM GOAL #4   Title Decreased pain in right shoulder by >= 80% with ADLS.    Time 12    Period Weeks    Status New      PT LONG TERM GOAL #5   Title Improved FOTO to 63    Baseline 32 at eval    Time 12    Period Weeks    Status New                 Plan - 09/23/20 0943    Clinical Impression Statement Patient tolerated treatment well today. Patient reported little soreness in shoulder. Patient did well with PROM today and with smooth arch range. Patient ROM within protocol limitatins. Patient issued HEP isometric sheet per PT. Patient goals progresisng due to healing/protocol.    Examination-Activity Limitations Hygiene/Grooming    Examination-Participation Restrictions Cleaning;Yard Work    Stability/Clinical Decision Making Stable/Uncomplicated    Rehab Potential Excellent    PT Frequency 2x / week    PT Duration 12 weeks    PT Treatment/Interventions ADLs/Self Care Home Management;Cryotherapy;Electrical Stimulation;Therapeutic exercise;Neuromuscular re-education;Patient/family education;Manual  techniques;Dry needling;Passive range of motion;Vasopneumatic Device    PT Next Visit Plan PROM/AAROM within protocol goals (30 deg ER and 120 deg flex at 4 weeks).  NO IR OR EXT x 12 wks    Consulted and Agree with Plan of Care Patient           Patient will benefit from skilled therapeutic intervention in order to improve the following deficits and impairments:  Decreased range of motion,Impaired UE functional use,Pain,Decreased strength,Increased edema,Postural dysfunction  Visit Diagnosis: Acute pain of right shoulder  Stiffness of right shoulder, not elsewhere classified  Muscle weakness (generalized)  Localized edema     Problem List Patient Active Problem List   Diagnosis Date Noted  . 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/08/2020    Phillips Climes, PTA 09/23/2020, 10:02 AM  [facility] [place], [place], [address] Phone: [phone]   Fax:  [phone]  Name: Stephen Diaz MRN: [id number] Date of Birth: 1956/05/24

## 2020-09-23 NOTE — Therapy (Signed)
[facility] [place], [place], [address] Phone: [phone]   Fax:  [phone]  Physical Therapy Treatment  Patient Details  Name: Stephen Diaz MRN: [id number] Date of Birth: 1955-06-14 Referring Provider (PT): Ophelia Charter, MD   Encounter Date: 09/23/2020   PT End of Session - 09/23/20 0901    Visit Number 2    Date for PT Re-Evaluation 12/15/20    Authorization Type MCR/MCD; [facility] and Progress note every 10th visit; Kx after 15    PT Start Time 0900    PT Stop Time 0942    PT Time Calculation (min) 42 min    Activity Tolerance Patient tolerated treatment well    Behavior During Therapy [facility] for tasks assessed/performed           Past Medical History:  Diagnosis Date  . Arthritis    right shoulder  . COPD (chronic obstructive pulmonary disease) ([place])   . Diabetes mellitus without complication ([place])   . Hypertension   . Right foot drop    from pinched nerve in back, wears brace    Past Surgical History:  Procedure Laterality Date  . CATARACT EXTRACTION Left   . CATARACT EXTRACTION W/PHACO Right 08/13/2020   Procedure: CATARACT EXTRACTION PHACO AND INTRAOCULAR LENS PLACEMENT (IOC);  Surgeon: Baruch Goldmann, MD;  [facility];  Service: Ophthalmology;  Laterality: Right;  CDE: 8.24  . HERNIA REPAIR    . REVERSE SHOULDER ARTHROPLASTY Right 09/08/2020   Procedure: REVERSE TOTAL SHOULDER ARTHROPLASTY;  Surgeon: Hiram Gash, MD;  Location: [facility];  Service: Orthopedics;  Laterality: Right;  . screws and steel plate in neck  (X3-2)     10-12 years ago  x2    There were no vitals filed for this visit.   Subjective Assessment - 09/23/20 1025    Subjective COVID-19 screening performed upon arrival. patient arrived in sling with minimal discomfort    Patient Stated Goals get it back to normal    Currently in Pain? Yes    Pain Score 4     Pain Location Shoulder    Pain Orientation Right     Pain Descriptors / Indicators Dull;Discomfort    Pain Type Surgical pain    Pain Onset 1 to 4 weeks ago    Pain Frequency Intermittent    Aggravating Factors  movement    Pain Relieving Factors rest                             [facility] Adult PT Treatment/Exercise - 09/23/20 0001      Vasopneumatic   Number Minutes Vasopneumatic  10 minutes    Vasopnuematic Location  Shoulder    Vasopneumatic Pressure Low    Vasopneumatic Temperature  34      Manual Therapy   Manual Therapy Passive ROM    Passive ROM manual ROM for right shoulder flexion and ER with gentle range within protocol limitations                  PT Education - 09/23/20 0934    Education Details HEP sheet    Person(s) Educated Patient    Methods Explanation;Demonstration;Handout    Comprehension Verbalized understanding;Returned demonstration               PT Long Term Goals - 09/22/20 1451      PT LONG TERM GOAL #1   Title Ind with advanced  HEP for ROM and strengthening to improve function.    Time 12    Period Weeks    Status New    Target Date 12/15/20      PT LONG TERM GOAL #2   Title Improved right shoulder ROM to [facility] to perform ADLS within restrictions.    Time 12    Period Weeks    Status New      PT LONG TERM GOAL #3   Title Pt able to perform ADLS with functional R shoulder strength.    Time 12    Period Weeks    Status New      PT LONG TERM GOAL #4   Title Decreased pain in right shoulder by >= 80% with ADLS.    Time 12    Period Weeks    Status New      PT LONG TERM GOAL #5   Title Improved FOTO to 63    Baseline 32 at eval    Time 12    Period Weeks    Status New                 Plan - 09/23/20 0943    Clinical Impression Statement Patient tolerated treatment well today. Patient reported little soreness in shoulder. Patient did well with PROM today and with smooth arch range. Patient ROM within protocol limitatins. Patient issued HEP isometric  sheet per PT. Patient goals progresisng due to healing/protocol.    Examination-Activity Limitations Hygiene/Grooming    Examination-Participation Restrictions Cleaning;Yard Work    Stability/Clinical Decision Making Stable/Uncomplicated    Rehab Potential Excellent    PT Frequency 2x / week    PT Duration 12 weeks    PT Treatment/Interventions ADLs/Self Care Home Management;Cryotherapy;Electrical Stimulation;Therapeutic exercise;Neuromuscular re-education;Patient/family education;Manual techniques;Dry needling;Passive range of motion;Vasopneumatic Device    PT Next Visit Plan PROM/AAROM within protocol goals (30 deg ER and 120 deg flex at 4 weeks).  NO IR OR EXT x 12 wks    Consulted and Agree with Plan of Care Patient           Patient will benefit from skilled therapeutic intervention in order to improve the following deficits and impairments:  Decreased range of motion,Impaired UE functional use,Pain,Decreased strength,Increased edema,Postural dysfunction  Visit Diagnosis: Acute pain of right shoulder  Stiffness of right shoulder, not elsewhere classified  Muscle weakness (generalized)  Localized edema     Problem List Patient Active Problem List   Diagnosis Date Noted  . 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/08/2020    Anays Detore P, PTA 09/23/2020, 10:26 AM  [facility] [place], [place], [address] Phone: [phone]   Fax:  [phone]  Name: HUSSIEN GREENBLATT MRN: [id number] Date of Birth: 1955/10/16

## 2020-09-23 NOTE — Patient Instructions (Signed)
    Isometric Shoulder Extension  Shoulder Extension  While sitting on a couch, gently push back against a pillow with your elbow  Perform 5-10 econds x10 reps 1 time a day      SHOULDER - ISOMETRIC ADDUCTION  Gently push your elbow into the side of your body.    Isometric Shoulder Abduction   Keep the affected arm bent at 90 degrees and close to your side. Place the ball on the outside of your elbow, against a wall and make a fist.  Press out into the ball (hinging at the shoulder). Try to avoid shrugging the shoulder

## 2020-09-27 ENCOUNTER — Ambulatory Visit: Payer: Medicare Other

## 2020-09-27 ENCOUNTER — Other Ambulatory Visit: Payer: Self-pay

## 2020-09-27 DIAGNOSIS — M25511 Pain in right shoulder: Secondary | ICD-10-CM

## 2020-09-27 DIAGNOSIS — M6281 Muscle weakness (generalized): Secondary | ICD-10-CM

## 2020-09-27 DIAGNOSIS — M25611 Stiffness of right shoulder, not elsewhere classified: Secondary | ICD-10-CM | POA: Diagnosis not present

## 2020-09-27 DIAGNOSIS — R600 Localized edema: Secondary | ICD-10-CM

## 2020-09-27 NOTE — Therapy (Signed)
[facility] [place], [place], [address] Phone: [phone]   Fax:  [phone]  Physical Therapy Treatment  Patient Details  Name: Stephen Diaz MRN: [id number] Date of Birth: 06/07/1956 Referring Provider (PT): Ophelia Charter, MD   Encounter Date: 09/27/2020   PT End of Session - 09/27/20 1602    Visit Number 3    Date for PT Re-Evaluation 12/15/20    Authorization Type MCR/MCD; [facility] and Progress note every 10th visit; Kx after 15    Progress Note Due on Visit 10    PT Start Time 1517    PT Stop Time 1603    PT Time Calculation (min) 46 min    Activity Tolerance Patient tolerated treatment well    Behavior During Therapy [facility] for tasks assessed/performed           Past Medical History:  Diagnosis Date  . Arthritis    right shoulder  . COPD (chronic obstructive pulmonary disease) ([place])   . Diabetes mellitus without complication ([place])   . Hypertension   . Right foot drop    from pinched nerve in back, wears brace    Past Surgical History:  Procedure Laterality Date  . CATARACT EXTRACTION Left   . CATARACT EXTRACTION W/PHACO Right 08/13/2020   Procedure: CATARACT EXTRACTION PHACO AND INTRAOCULAR LENS PLACEMENT (IOC);  Surgeon: Baruch Goldmann, MD;  [facility];  Service: Ophthalmology;  Laterality: Right;  CDE: 8.24  . HERNIA REPAIR    . REVERSE SHOULDER ARTHROPLASTY Right 09/08/2020   Procedure: REVERSE TOTAL SHOULDER ARTHROPLASTY;  Surgeon: Hiram Gash, MD;  Location: [place];  Service: Orthopedics;  Laterality: Right;  . screws and steel plate in neck  (X6-5)     10-12 years ago  x2    There were no vitals filed for this visit.   Subjective Assessment - 09/27/20 1517    Subjective COVID-19 screening performed upon arrival.  Pt arrived with sling on.  Reports pain scale 6/10 sore achey pain that changes with movement.    Currently in Pain? Yes    Pain Score 6     Pain Location  Shoulder    Pain Orientation Right    Pain Descriptors / Indicators Aching;Dull    Pain Type Surgical pain    Pain Radiating Towards deltiod    Pain Onset 1 to 4 weeks ago    Pain Frequency Intermittent    Aggravating Factors  movement    Pain Relieving Factors rest                             [facility] Adult PT Treatment/Exercise - 09/27/20 0001      Exercises   Exercises Shoulder      Shoulder Exercises: Supine   External Rotation AAROM;10 reps;Right    External Rotation Limitations AAROM 20 degrees with PVC pipe    Flexion AAROM;10 reps    Flexion Limitations AAROM 120 degrees with PVC pipe      Shoulder Exercises: Seated   Other Seated Exercises yellow putty grip exercises    Other Seated Exercises Isometric biceps/triceps/ wrist      Manual Therapy   Manual Therapy Passive ROM    Passive ROM manual ROM for right shoulder flexion and ER with gentle range within protocol limitations                       PT  Long Term Goals - 09/22/20 1451      PT LONG TERM GOAL #1   Title Ind with advanced HEP for ROM and strengthening to improve function.    Time 12    Period Weeks    Status New    Target Date 12/15/20      PT LONG TERM GOAL #2   Title Improved right shoulder ROM to [facility] to perform ADLS within restrictions.    Time 12    Period Weeks    Status New      PT LONG TERM GOAL #3   Title Pt able to perform ADLS with functional R shoulder strength.    Time 12    Period Weeks    Status New      PT LONG TERM GOAL #4   Title Decreased pain in right shoulder by >= 80% with ADLS.    Time 12    Period Weeks    Status New      PT LONG TERM GOAL #5   Title Improved FOTO to 63    Baseline 32 at eval    Time 12    Period Weeks    Status New                 Plan - 09/27/20 1555    Clinical Impression Statement Pt tolerated well towards POC.  2 days short of 3 weeks post-op, sessoin based upon protocol.  Session focus with ROM per  protocol.  Able to acheive 120 degrees flexion with PROM and ER at 22 degress.  Progressed to [facility] with PVC pipe, ROM assessed and kept within range per protocol.  Advanced HEP with additional grip strengthen putty.    Examination-Activity Limitations Hygiene/Grooming    Examination-Participation Restrictions Cleaning;Yard Work    Stability/Clinical Decision Making Stable/Uncomplicated    Designer, jewellery Low    Rehab Potential Excellent    PT Frequency 2x / week    PT Duration 12 weeks    PT Treatment/Interventions ADLs/Self Care Home Management;Cryotherapy;Electrical Stimulation;Therapeutic exercise;Neuromuscular re-education;Patient/family education;Manual techniques;Dry needling;Passive range of motion;Vasopneumatic Device    PT Next Visit Plan PROM/AAROM within protocol goals (30 deg ER and 120 deg flex at 4 weeks).  NO IR OR EXT x 12 wks    PT Home Exercise Plan cont with ball squeeze; active elbow exension, seated isometrics below shoulder level (ADD/ABD/ext)    Consulted and Agree with Plan of Care Patient           Patient will benefit from skilled therapeutic intervention in order to improve the following deficits and impairments:  Decreased range of motion,Impaired UE functional use,Pain,Decreased strength,Increased edema,Postural dysfunction  Visit Diagnosis: Acute pain of right shoulder  Stiffness of right shoulder, not elsewhere classified  Muscle weakness (generalized)  Localized edema     Problem List Patient Active Problem List   Diagnosis Date Noted  . 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/08/2020   Ihor Austin, LPTA/CLT; CBIS [id number]  Aldona Lento 09/27/2020, [facility] [place], [place], [address] Phone: [phone]   Fax:  [phone]  Name: Stephen Diaz MRN: [id number] Date of Birth: 15-May-1956

## 2020-09-30 ENCOUNTER — Ambulatory Visit: Payer: Medicare Other | Admitting: *Deleted

## 2020-09-30 ENCOUNTER — Other Ambulatory Visit: Payer: Self-pay

## 2020-09-30 DIAGNOSIS — M25611 Stiffness of right shoulder, not elsewhere classified: Secondary | ICD-10-CM

## 2020-09-30 DIAGNOSIS — M6281 Muscle weakness (generalized): Secondary | ICD-10-CM

## 2020-09-30 DIAGNOSIS — R600 Localized edema: Secondary | ICD-10-CM

## 2020-09-30 DIAGNOSIS — M25511 Pain in right shoulder: Secondary | ICD-10-CM

## 2020-09-30 NOTE — Therapy (Signed)
[facility] [place], [place], [address] Phone: [phone]   Fax:  [phone]  Physical Therapy Treatment  Patient Details  Name: Stephen Diaz MRN: [id number] Date of Birth: May 13, 1956 Referring Provider (PT): Ophelia Charter, MD   Encounter Date: 09/30/2020   PT End of Session - 09/30/20 1623    Visit Number 4    Number of Visits 24    Date for PT Re-Evaluation 12/15/20    Authorization Type MCR/MCD; [facility] and Progress note every 10th visit; Kx after 15    Progress Note Due on Visit 10    PT Start Time 1515    PT Stop Time 4967    PT Time Calculation (min) 49 min           Past Medical History:  Diagnosis Date  . Arthritis    right shoulder  . COPD (chronic obstructive pulmonary disease) ([place])   . Diabetes mellitus without complication ([place])   . Hypertension   . Right foot drop    from pinched nerve in back, wears brace    Past Surgical History:  Procedure Laterality Date  . CATARACT EXTRACTION Left   . CATARACT EXTRACTION W/PHACO Right 08/13/2020   Procedure: CATARACT EXTRACTION PHACO AND INTRAOCULAR LENS PLACEMENT (IOC);  Surgeon: Baruch Goldmann, MD;  [facility];  Service: Ophthalmology;  Laterality: Right;  CDE: 8.24  . HERNIA REPAIR    . REVERSE SHOULDER ARTHROPLASTY Right 09/08/2020   Procedure: REVERSE TOTAL SHOULDER ARTHROPLASTY;  Surgeon: Hiram Gash, MD;  Location: [place];  Service: Orthopedics;  Laterality: Right;  . screws and steel plate in neck  (R9-1)     10-12 years ago  x2    There were no vitals filed for this visit.   Subjective Assessment - 09/30/20 1518    Subjective COVID-19 screening performed upon arrival.  Pt arrived with sling on.  Reports pain scale 6/10 sore achey pain that changes with movement.    Patient Stated Goals get it back to normal    Currently in Pain? Yes    Pain Score 6     Pain Location Shoulder    Pain Orientation Right    Pain  Descriptors / Indicators Aching;Sore    Pain Type Surgical pain    Pain Onset 1 to 4 weeks ago                             [facility] Adult PT Treatment/Exercise - 09/30/20 0001      Exercises   Exercises Shoulder      Shoulder Exercises: Supine   Protraction AAROM;10 reps   press   External Rotation AAROM;10 reps;Right   pillow under elbow   Flexion AAROM;10 reps      Vasopneumatic   Number Minutes Vasopneumatic  10 minutes    Vasopnuematic Location  Shoulder    Vasopneumatic Pressure Low    Vasopneumatic Temperature  34      Manual Therapy   Manual Therapy Passive ROM    Passive ROM manual ROM for right shoulder flexion and ER with gentle range within protocol limitations                       PT Long Term Goals - 09/22/20 1451      PT LONG TERM GOAL #1   Title Ind with advanced HEP for ROM and strengthening to improve function.  Time 12    Period Weeks    Status New    Target Date 12/15/20      PT LONG TERM GOAL #2   Title Improved right shoulder ROM to [facility] to perform ADLS within restrictions.    Time 12    Period Weeks    Status New      PT LONG TERM GOAL #3   Title Pt able to perform ADLS with functional R shoulder strength.    Time 12    Period Weeks    Status New      PT LONG TERM GOAL #4   Title Decreased pain in right shoulder by >= 80% with ADLS.    Time 12    Period Weeks    Status New      PT LONG TERM GOAL #5   Title Improved FOTO to 63    Baseline 32 at eval    Time 12    Period Weeks    Status New                 Plan - 09/30/20 1521    Clinical Impression Statement Pt arrived today doing fairly well with sling donned on Rt shldr. Manual PROM performed within limits to RT shldr as well as  AAROM  with supine cane exs. Vaso end of session for pain and edema.    Examination-Activity Limitations Hygiene/Grooming    Examination-Participation Restrictions Cleaning;Yard Work    Stability/Clinical Decision  Making Stable/Uncomplicated    Rehab Potential Excellent    PT Frequency 2x / week    PT Duration 12 weeks    PT Treatment/Interventions ADLs/Self Care Home Management;Cryotherapy;Electrical Stimulation;Therapeutic exercise;Neuromuscular re-education;Patient/family education;Manual techniques;Dry needling;Passive range of motion;Vasopneumatic Device    PT Next Visit Plan PROM/AAROM within protocol goals (30 deg ER and 120 deg flex at 4 weeks).  NO IR OR EXT x 12 wks           Patient will benefit from skilled therapeutic intervention in order to improve the following deficits and impairments:  Decreased range of motion,Impaired UE functional use,Pain,Decreased strength,Increased edema,Postural dysfunction  Visit Diagnosis: Acute pain of right shoulder  Stiffness of right shoulder, not elsewhere classified  Muscle weakness (generalized)  Localized edema     Problem List Patient Active Problem List   Diagnosis Date Noted  . 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/08/2020    Olanna Percifield,CHRIS, PTA 09/30/2020, 4:26 PM  [facility] [place], [place], [address] Phone: [phone]   Fax:  [phone]  Name: Stephen Diaz MRN: [id number] Date of Birth: Jan 26, 1956

## 2020-10-04 ENCOUNTER — Ambulatory Visit: Payer: Medicare Other | Admitting: Physical Therapy

## 2020-10-04 ENCOUNTER — Other Ambulatory Visit: Payer: Self-pay

## 2020-10-04 DIAGNOSIS — M6281 Muscle weakness (generalized): Secondary | ICD-10-CM

## 2020-10-04 DIAGNOSIS — R600 Localized edema: Secondary | ICD-10-CM

## 2020-10-04 DIAGNOSIS — M25611 Stiffness of right shoulder, not elsewhere classified: Secondary | ICD-10-CM

## 2020-10-04 DIAGNOSIS — M25511 Pain in right shoulder: Secondary | ICD-10-CM

## 2020-10-04 NOTE — Therapy (Signed)
[facility] [place], [place], [address] Phone: [phone]   Fax:  [phone]  Physical Therapy Treatment  Patient Details  Name: Stephen Diaz MRN: [id number] Date of Birth: [birth year] Referring Provider (PT): Ophelia Charter, MD   Encounter Date: 10/04/2020   PT End of Session - 10/04/20 0958    Visit Number 5    Number of Visits 24    Date for PT Re-Evaluation 12/15/20    Authorization Type MCR/MCD; [facility] and Progress note every 10th visit; Kx after 15    PT Start Time 0945    PT Stop Time 1020    PT Time Calculation (min) 35 min    Activity Tolerance Patient tolerated treatment well    Behavior During Therapy [facility] for tasks assessed/performed           Past Medical History:  Diagnosis Date  . Arthritis    right shoulder  . COPD (chronic obstructive pulmonary disease) ([place])   . Diabetes mellitus without complication ([place])   . Hypertension   . Right foot drop    from pinched nerve in back, wears brace    Past Surgical History:  Procedure Laterality Date  . CATARACT EXTRACTION Left   . CATARACT EXTRACTION W/PHACO Right 08/13/2020   Procedure: CATARACT EXTRACTION PHACO AND INTRAOCULAR LENS PLACEMENT (IOC);  Surgeon: Baruch Goldmann, MD;  Location[facility];  Service: Ophthalmology;  Laterality: Right;  CDE: 8.24  . HERNIA REPAIR    . REVERSE SHOULDER ARTHROPLASTY Right 09/08/2020   Procedure: REVERSE TOTAL SHOULDER ARTHROPLASTY;  Surgeon: Hiram Gash, MD;  Location: [place];  Service: Orthopedics;  Laterality: Right;  . screws and steel plate in neck  (X7-3)     10-12 years ago  x2    There were no vitals filed for this visit.   Subjective Assessment - 10/04/20 0945    Subjective COVID-19 screening performed upon arrival.  Patient arrived with some discomfort    Patient Stated Goals get it back to normal    Currently in Pain? Yes    Pain Score 5     Pain Location Shoulder    Pain  Orientation Right    Pain Descriptors / Indicators Discomfort    Pain Type Surgical pain    Pain Onset 1 to 4 weeks ago    Pain Frequency Intermittent    Aggravating Factors  movement of shoulder    Pain Relieving Factors at rest              [facility] PT Assessment - 10/04/20 0001      PROM   PROM Assessment Site Shoulder    Right/Left Shoulder Right    Right Shoulder Flexion 120 Degrees    Right Shoulder External Rotation 30 Degrees                         [facility] Adult PT Treatment/Exercise - 10/04/20 0001      Shoulder Exercises: Supine   Protraction AAROM;10 reps   press with cane   External Rotation AAROM;10 reps;Right   pillow under elbow   Flexion AAROM;10 reps   cane     Vasopneumatic   Number Minutes Vasopneumatic  10 minutes    Vasopnuematic Location  Shoulder    Vasopneumatic Pressure Low    Vasopneumatic Temperature  34      Manual Therapy   Manual Therapy Passive ROM    Passive ROM manual ROM for  right shoulder flexion and ER with gentle range within protocol limitations                       PT Long Term Goals - 10/04/20 0959      PT LONG TERM GOAL #1   Title Ind with advanced HEP for ROM and strengthening to improve function.    Time 12    Period Weeks    Status On-going      PT LONG TERM GOAL #2   Title Improved right shoulder ROM to [facility] to perform ADLS within restrictions.    Time 12    Period Weeks    Status On-going      PT LONG TERM GOAL #3   Title Pt able to perform ADLS with functional R shoulder strength.    Time 12    Period Weeks    Status On-going      PT LONG TERM GOAL #4   Title Decreased pain in right shoulder by >= 80% with ADLS.    Time 12    Period Weeks    Status On-going      PT LONG TERM GOAL #5   Title Improved FOTO to 63    Baseline 32 at eval    Period Weeks    Status On-going                 Plan - 10/04/20 1000    Clinical Impression Statement Patient tolerated treatment  well today. Patient continues to progress with PROM in right shoulder per protocol. Patient doing well with HEP thus far. MD F/U this friday. Goals progressing today.    Examination-Activity Limitations Hygiene/Grooming    Examination-Participation Restrictions Cleaning;Yard Work    Stability/Clinical Decision Making Stable/Uncomplicated    Rehab Potential Excellent    PT Frequency 2x / week    PT Duration 12 weeks    PT Treatment/Interventions ADLs/Self Care Home Management;Cryotherapy;Electrical Stimulation;Therapeutic exercise;Neuromuscular re-education;Patient/family education;Manual techniques;Dry needling;Passive range of motion;Vasopneumatic Device    PT Next Visit Plan PROM/AAROM within protocol goals (30 deg ER and 120 deg flex at 4 weeks).  NO IR OR EXT x 12 wks MD appt Friday    Consulted and Agree with Plan of Care Patient           Patient will benefit from skilled therapeutic intervention in order to improve the following deficits and impairments:  Decreased range of motion,Impaired UE functional use,Pain,Decreased strength,Increased edema,Postural dysfunction  Visit Diagnosis: Acute pain of right shoulder  Stiffness of right shoulder, not elsewhere classified  Muscle weakness (generalized)  Localized edema     Problem List Patient Active Problem List   Diagnosis Date Noted  . 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/08/2020    Phillips Climes, PTA 10/04/2020, 10:22 AM  [facility] [place], [place], [address] Phone: [phone]   Fax:  [phone]  Name: Stephen Diaz MRN: [id number] Date of Birth: May 28, 1956

## 2020-10-07 ENCOUNTER — Other Ambulatory Visit: Payer: Self-pay

## 2020-10-07 ENCOUNTER — Ambulatory Visit: Payer: Medicare Other | Admitting: Physical Therapy

## 2020-10-07 ENCOUNTER — Encounter: Payer: Self-pay | Admitting: Physical Therapy

## 2020-10-07 DIAGNOSIS — M6281 Muscle weakness (generalized): Secondary | ICD-10-CM

## 2020-10-07 DIAGNOSIS — M25611 Stiffness of right shoulder, not elsewhere classified: Secondary | ICD-10-CM | POA: Diagnosis not present

## 2020-10-07 DIAGNOSIS — M25511 Pain in right shoulder: Secondary | ICD-10-CM

## 2020-10-07 DIAGNOSIS — R600 Localized edema: Secondary | ICD-10-CM

## 2020-10-07 NOTE — Therapy (Signed)
[facility] [place], [place], [address] Phone: [phone]   Fax:  [phone]  Physical Therapy Treatment  Patient Details  Name: Stephen Diaz MRN: [id number] Date of Birth: 1955/07/25 Referring Provider (PT): Ophelia Charter, MD   Encounter Date: 10/07/2020   PT End of Session - 10/07/20 0951    Visit Number 6    Number of Visits 24    Date for PT Re-Evaluation 12/15/20    Authorization Type MCR/MCD; [facility] and Progress note every 10th visit; Kx after 15    Progress Note Due on Visit 10    PT Start Time 0951    PT Stop Time 1032    PT Time Calculation (min) 41 min    Activity Tolerance Patient tolerated treatment well    Behavior During Therapy [facility] for tasks assessed/performed           Past Medical History:  Diagnosis Date  . Arthritis    right shoulder  . COPD (chronic obstructive pulmonary disease) ([place])   . Diabetes mellitus without complication ([place])   . Hypertension   . Right foot drop    from pinched nerve in back, wears brace    Past Surgical History:  Procedure Laterality Date  . CATARACT EXTRACTION Left   . CATARACT EXTRACTION W/PHACO Right 08/13/2020   Procedure: CATARACT EXTRACTION PHACO AND INTRAOCULAR LENS PLACEMENT (IOC);  Surgeon: Baruch Goldmann, MD;  [facility];  Service: Ophthalmology;  Laterality: Right;  CDE: 8.24  . HERNIA REPAIR    . REVERSE SHOULDER ARTHROPLASTY Right 09/08/2020   Procedure: REVERSE TOTAL SHOULDER ARTHROPLASTY;  Surgeon: Hiram Gash, MD;  Location: [place];  Service: Orthopedics;  Laterality: Right;  . screws and steel plate in neck  (J1-9)     10-12 years ago  x2    There were no vitals filed for this visit.   Subjective Assessment - 10/07/20 0950    Subjective COVID-19 screening performed upon arrival.  Patient arrived with some discomfort    Patient Stated Goals get it back to normal    Currently in Pain? Yes    Pain Score 5     Pain  Location Shoulder    Pain Orientation Right    Pain Descriptors / Indicators Sore    Pain Type Surgical pain    Pain Onset 1 to 4 weeks ago    Pain Frequency Constant              [facility] PT Assessment - 10/07/20 0001      Assessment   Medical Diagnosis right reverse TSA    Referring Provider (PT) Ophelia Charter, MD    Onset Date/Surgical Date 09/08/20    Hand Dominance Right    Next MD Visit 10/08/20    Prior Therapy no      Precautions   Precautions Shoulder    Precaution Comments avoid any IR and extension; no resisted IR/ext x 3 months      Restrictions   Weight Bearing Restrictions No      ROM / Strength   AROM / PROM / Strength PROM      PROM   Overall PROM  Deficits    PROM Assessment Site Shoulder    Right/Left Shoulder Right    Right Shoulder Flexion 135 Degrees    Right Shoulder External Rotation 45 Degrees  [facility] Adult PT Treatment/Exercise - 10/07/20 0001      Modalities   Modalities Vasopneumatic      Vasopneumatic   Number Minutes Vasopneumatic  15 minutes    Vasopnuematic Location  Shoulder    Vasopneumatic Pressure Low    Vasopneumatic Temperature  34      Manual Therapy   Manual Therapy Passive ROM    Passive ROM PROM of R shoulder into flex, ER with gentle holds at end range                       PT Long Term Goals - 10/04/20 0959      PT LONG TERM GOAL #1   Title Ind with advanced HEP for ROM and strengthening to improve function.    Time 12    Period Weeks    Status On-going      PT LONG TERM GOAL #2   Title Improved right shoulder ROM to [facility] to perform ADLS within restrictions.    Time 12    Period Weeks    Status On-going      PT LONG TERM GOAL #3   Title Pt able to perform ADLS with functional R shoulder strength.    Time 12    Period Weeks    Status On-going      PT LONG TERM GOAL #4   Title Decreased pain in right shoulder by >= 80% with ADLS.    Time 12    Period Weeks     Status On-going      PT LONG TERM GOAL #5   Title Improved FOTO to 63    Baseline 32 at eval    Period Weeks    Status On-going                 Plan - 10/07/20 1029    Clinical Impression Statement Patient presented in clinic with reports of moderate R shoulder pain upon arrival. Sling donned upon arrival. No reports of any increased pain with PROM in any direction. Firm end feels and smooth arc of motion noted during PROM in flexion, ER. Normal vasopnuematic response noted following removal of the modality.    Examination-Activity Limitations Hygiene/Grooming    Examination-Participation Restrictions Cleaning;Yard Work    Stability/Clinical Decision Making Stable/Uncomplicated    Rehab Potential Excellent    PT Frequency 2x / week    PT Duration 12 weeks    PT Treatment/Interventions ADLs/Self Care Home Management;Cryotherapy;Electrical Stimulation;Therapeutic exercise;Neuromuscular re-education;Patient/family education;Manual techniques;Dry needling;Passive range of motion;Vasopneumatic Device    PT Next Visit Plan PROM/AAROM within protocol goals (30 deg ER and 120 deg flex at 4 weeks).  NO IR OR EXT x 12 wks MD appt Friday    PT Home Exercise Plan cont with ball squeeze; active elbow exension, seated isometrics below shoulder level (ADD/ABD/ext)    Consulted and Agree with Plan of Care Patient           Patient will benefit from skilled therapeutic intervention in order to improve the following deficits and impairments:  Decreased range of motion,Impaired UE functional use,Pain,Decreased strength,Increased edema,Postural dysfunction  Visit Diagnosis: Acute pain of right shoulder  Stiffness of right shoulder, not elsewhere classified  Muscle weakness (generalized)  Localized edema     Problem List Patient Active Problem List   Diagnosis Date Noted  . 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/08/2020   Standley Brooking, PTA 10/07/20 10:37  AM   [facility] [place], [place], [address] Phone: [phone]   Fax:  [phone]  Name: Stephen Diaz MRN: [id number] Date of Birth: [birth year]

## 2020-10-11 ENCOUNTER — Other Ambulatory Visit: Payer: Self-pay

## 2020-10-11 ENCOUNTER — Ambulatory Visit: Payer: Medicare Other | Attending: Orthopaedic Surgery | Admitting: Physical Therapy

## 2020-10-11 DIAGNOSIS — M25511 Pain in right shoulder: Secondary | ICD-10-CM | POA: Diagnosis present

## 2020-10-11 DIAGNOSIS — M25611 Stiffness of right shoulder, not elsewhere classified: Secondary | ICD-10-CM

## 2020-10-11 DIAGNOSIS — M6281 Muscle weakness (generalized): Secondary | ICD-10-CM | POA: Diagnosis present

## 2020-10-11 DIAGNOSIS — R600 Localized edema: Secondary | ICD-10-CM | POA: Diagnosis present

## 2020-10-11 NOTE — Therapy (Signed)
[facility] [place], [place], [address] Phone: [phone]   Fax:  [phone]  Physical Therapy Treatment  Patient Details  Name: Stephen Diaz MRN: [id number] Date of Birth: [birth year] Referring Provider (PT): Ophelia Charter, MD   Encounter Date: 10/11/2020   PT End of Session - 10/11/20 1012    Visit Number 7    Number of Visits 24    Date for PT Re-Evaluation 12/15/20    Authorization Type MCR/MCD; [facility] and Progress note every 10th visit; Kx after 15    PT Start Time 0945    PT Stop Time 1025    PT Time Calculation (min) 40 min    Activity Tolerance Patient tolerated treatment well    Behavior During Therapy [facility] for tasks assessed/performed           Past Medical History:  Diagnosis Date  . Arthritis    right shoulder  . COPD (chronic obstructive pulmonary disease) ([place])   . Diabetes mellitus without complication ([place])   . Hypertension   . Right foot drop    from pinched nerve in back, wears brace    Past Surgical History:  Procedure Laterality Date  . CATARACT EXTRACTION Left   . CATARACT EXTRACTION W/PHACO Right 08/13/2020   Procedure: CATARACT EXTRACTION PHACO AND INTRAOCULAR LENS PLACEMENT (IOC);  Surgeon: Baruch Goldmann, MD;  Location: [facility];  Service: Ophthalmology;  Laterality: Right;  CDE: 8.24  . HERNIA REPAIR    . REVERSE SHOULDER ARTHROPLASTY Right 09/08/2020   Procedure: REVERSE TOTAL SHOULDER ARTHROPLASTY;  Surgeon: Hiram Gash, MD;  Location: [place];  Service: Orthopedics;  Laterality: Right;  . screws and steel plate in neck  (V4-9)     10-12 years ago  x2    There were no vitals filed for this visit.   Subjective Assessment - 10/11/20 0948    Subjective COVID-19 screening performed upon arrival.  Patient arrived with some discomfort yet doing well, went to MD and DC sling    Patient Stated Goals get it back to normal    Currently in Pain? Yes    Pain Score 5      Pain Location Shoulder    Pain Orientation Right    Pain Descriptors / Indicators Sore    Pain Type Surgical pain    Pain Onset 1 to 4 weeks ago    Pain Frequency Constant    Aggravating Factors  movement    Pain Relieving Factors rest                             [facility] Adult PT Treatment/Exercise - 10/11/20 0001      Vasopneumatic   Number Minutes Vasopneumatic  15 minutes    Vasopnuematic Location  Shoulder    Vasopneumatic Pressure Low    Vasopneumatic Temperature  34      Manual Therapy   Manual Therapy Passive ROM    Passive ROM PROM of R shoulder into flex, ER with gentle holds at end range to improve mobility                       PT Long Term Goals - 10/04/20 0959      PT LONG TERM GOAL #1   Title Ind with advanced HEP for ROM and strengthening to improve function.    Time 12    Period Weeks    Status  On-going      PT LONG TERM GOAL #2   Title Improved right shoulder ROM to [facility] to perform ADLS within restrictions.    Time 12    Period Weeks    Status On-going      PT LONG TERM GOAL #3   Title Pt able to perform ADLS with functional R shoulder strength.    Time 12    Period Weeks    Status On-going      PT LONG TERM GOAL #4   Title Decreased pain in right shoulder by >= 80% with ADLS.    Time 12    Period Weeks    Status On-going      PT LONG TERM GOAL #5   Title Improved FOTO to 63    Baseline 32 at eval    Period Weeks    Status On-going                 Plan - 10/11/20 1013    Clinical Impression Statement Patient tolerated treatment well today. Patient continues to improve with ROM forall motions. Patient went to MD and healing well and no more sling. Patient current goals progressing. Normal response to modalities.    Examination-Activity Limitations Hygiene/Grooming    Examination-Participation Restrictions Cleaning;Yard Work    Stability/Clinical Decision Making Stable/Uncomplicated    Rehab Potential  Excellent    PT Frequency 2x / week    PT Duration 12 weeks    PT Treatment/Interventions ADLs/Self Care Home Management;Cryotherapy;Electrical Stimulation;Therapeutic exercise;Neuromuscular re-education;Patient/family education;Manual techniques;Dry needling;Passive range of motion;Vasopneumatic Device    PT Next Visit Plan PROM/AAROM within protocol goals (30 deg ER and 120 deg flex at 4 weeks).  NO IR OR EXT x 12 wks (5 weeks 10/13/20)    Consulted and Agree with Plan of Care Patient           Patient will benefit from skilled therapeutic intervention in order to improve the following deficits and impairments:  Decreased range of motion,Impaired UE functional use,Pain,Decreased strength,Increased edema,Postural dysfunction  Visit Diagnosis: Acute pain of right shoulder  Stiffness of right shoulder, not elsewhere classified  Muscle weakness (generalized)  Localized edema     Problem List Patient Active Problem List   Diagnosis Date Noted  . 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/08/2020    Phillips Climes, PTA 10/11/2020, 10:27 AM  [facility] [place], [place], [address] Phone: [phone]   Fax:  [phone]  Name: Stephen Diaz MRN: [id number] Date of Birth: 1955/08/16

## 2020-10-14 ENCOUNTER — Other Ambulatory Visit: Payer: Self-pay

## 2020-10-14 ENCOUNTER — Ambulatory Visit: Payer: Medicare Other | Admitting: Physical Therapy

## 2020-10-14 DIAGNOSIS — M25511 Pain in right shoulder: Secondary | ICD-10-CM

## 2020-10-14 DIAGNOSIS — M6281 Muscle weakness (generalized): Secondary | ICD-10-CM

## 2020-10-14 DIAGNOSIS — M25611 Stiffness of right shoulder, not elsewhere classified: Secondary | ICD-10-CM

## 2020-10-14 DIAGNOSIS — R600 Localized edema: Secondary | ICD-10-CM

## 2020-10-14 NOTE — Therapy (Signed)
[facility] [place], [place], [address] Phone: [phone]   Fax:  [phone]  Physical Therapy Treatment  Patient Details  Name: NINO AMANO MRN: [id number] Date of Birth: 1955-07-06 Referring Provider (PT): Ophelia Charter, MD   Encounter Date: 10/14/2020   PT End of Session - 10/14/20 1121    Visit Number 8    Number of Visits 24    Date for PT Re-Evaluation 12/15/20    Authorization Type MCR/MCD; [facility] and Progress note every 10th visit; Kx after 15    Progress Note Due on Visit 10    PT Start Time 1120    PT Stop Time 1202    PT Time Calculation (min) 42 min    Activity Tolerance Patient tolerated treatment well    Behavior During Therapy [facility] for tasks assessed/performed           Past Medical History:  Diagnosis Date  . Arthritis    right shoulder  . COPD (chronic obstructive pulmonary disease) ([place])   . Diabetes mellitus without complication ([place])   . Hypertension   . Right foot drop    from pinched nerve in back, wears brace    Past Surgical History:  Procedure Laterality Date  . CATARACT EXTRACTION Left   . CATARACT EXTRACTION W/PHACO Right 08/13/2020   Procedure: CATARACT EXTRACTION PHACO AND INTRAOCULAR LENS PLACEMENT (IOC);  Surgeon: Baruch Goldmann, MD;  Location: [facility];  Service: Ophthalmology;  Laterality: Right;  CDE: 8.24  . HERNIA REPAIR    . REVERSE SHOULDER ARTHROPLASTY Right 09/08/2020   Procedure: REVERSE TOTAL SHOULDER ARTHROPLASTY;  Surgeon: Hiram Gash, MD;  Location: [place];  Service: Orthopedics;  Laterality: Right;  . screws and steel plate in neck  (P9-5)     10-12 years ago  x2    There were no vitals filed for this visit.   Subjective Assessment - 10/14/20 1121    Subjective COVID-19 screening performed upon arrival.  Sees MD 12/11/20              [facility] PT Assessment - 10/14/20 0001      ROM / Strength   AROM / PROM / Strength AROM;PROM      AROM    AROM Assessment Site Shoulder    Right/Left Shoulder Right    Right Shoulder External Rotation 45 Degrees      PROM   PROM Assessment Site Shoulder    Right Shoulder Flexion 152 Degrees    Right Shoulder ABduction 94 Degrees    Right Shoulder External Rotation 50 Degrees                         [facility] Adult PT Treatment/Exercise - 10/14/20 0001      Shoulder Exercises: Supine   Flexion AAROM;15 reps    Flexion Limitations with dowel; also chest press x 20      Shoulder Exercises: Seated   External Rotation 20 reps    Abduction AAROM;Right;10 reps    ABduction Limitations painfree      Shoulder Exercises: Sidelying   Flexion AAROM;Right;10 reps    Flexion Limitations Wall slide with towel      Shoulder Exercises: Standing   ABduction AAROM;Right;5 reps      Shoulder Exercises: Pulleys   Flexion 3 minutes    Other Pulley Exercises ladder x 10      Vasopneumatic   Number Minutes Vasopneumatic  10 minutes  Vasopnuematic Location  Shoulder    Vasopneumatic Pressure Low    Vasopneumatic Temperature  34      Manual Therapy   Manual Therapy Passive ROM    Passive ROM PROM of R shoulder into flex, ER with gentle holds at end range to improve mobility                  PT Education - 10/14/20 1157    Education Details HEP AAROM    Person(s) Educated Patient    Methods Explanation;Demonstration;Handout    Comprehension Verbalized understanding;Returned demonstration               PT Long Term Goals - 10/14/20 1206      PT LONG TERM GOAL #1   Title Ind with advanced HEP for ROM and strengthening to improve function.    Status On-going      PT LONG TERM GOAL #2   Title Improved right shoulder ROM to [facility] to perform ADLS within restrictions.    Status On-going      PT LONG TERM GOAL #3   Title Pt able to perform ADLS with functional R shoulder strength.    Status On-going      PT LONG TERM GOAL #4   Title Decreased pain in right  shoulder by >= 80% with ADLS.    Status On-going                 Plan - 10/14/20 1202    Clinical Impression Statement Patient progressing well with ROM and tolerated AAROM well without complaints of pain as long as he didn't push too far. HEP progressed with AAROM.    PT Frequency 2x / week    PT Duration 12 weeks    PT Treatment/Interventions ADLs/Self Care Home Management;Cryotherapy;Electrical Stimulation;Therapeutic exercise;Neuromuscular re-education;Patient/family education;Manual techniques;Dry needling;Passive range of motion;Vasopneumatic Device    PT Next Visit Plan PROM/AAROM/AROM; light passive stretching at end range; begin resisted bands ER/FF/ABD concentric only;  NO IR OR EXT x 12 wks (6 weeks 10/20/20)    PT Home Exercise Plan [id number] - AAROM; active scap retraction with ER    Consulted and Agree with Plan of Care Patient           Patient will benefit from skilled therapeutic intervention in order to improve the following deficits and impairments:  Decreased range of motion,Impaired UE functional use,Pain,Decreased strength,Increased edema,Postural dysfunction  Visit Diagnosis: Stiffness of right shoulder, not elsewhere classified  Acute pain of right shoulder  Muscle weakness (generalized)  Localized edema     Problem List Patient Active Problem List   Diagnosis Date Noted  . 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/08/2020    Madelyn Flavors PT 10/14/2020, 12:16 PM  [facility] [place], [place], [address] Phone: [phone]   Fax:  [phone]  Name: YAMIR CARIGNAN MRN: [id number] Date of Birth: 23-Jul-1955

## 2020-10-14 NOTE — Patient Instructions (Signed)
Access Code: [email or web address] URL: [email or web address] Date: 10/14/2020 Prepared by: Almyra Free  Exercises Supine Shoulder Flexion Extension AAROM with Dowel - 2 x daily - 7 x weekly - 1-3 sets - 10 reps Supine Shoulder External Rotation with Dowel - 2 x daily - 7 x weekly - 1-3 sets - 10 reps Seated Shoulder Abduction AAROM with Dowel - 2 x daily - 7 x weekly - 3 sets - 10 reps - 3-5 sec hold Standing Shoulder Flexion Wall Walk - 1 x daily - 7 x weekly - 3 sets - 10 reps Seated Scapular Retraction with External Rotation - 1 x daily - 7 x weekly - 3 sets - 10 reps

## 2020-10-18 ENCOUNTER — Other Ambulatory Visit: Payer: Self-pay

## 2020-10-18 ENCOUNTER — Ambulatory Visit: Payer: Medicare Other | Admitting: Physical Therapy

## 2020-10-18 DIAGNOSIS — R600 Localized edema: Secondary | ICD-10-CM

## 2020-10-18 DIAGNOSIS — M25511 Pain in right shoulder: Secondary | ICD-10-CM | POA: Diagnosis not present

## 2020-10-18 DIAGNOSIS — M25611 Stiffness of right shoulder, not elsewhere classified: Secondary | ICD-10-CM

## 2020-10-18 DIAGNOSIS — M6281 Muscle weakness (generalized): Secondary | ICD-10-CM

## 2020-10-18 NOTE — Therapy (Signed)
[facility] [place], [place], [address] Phone: [phone]   Fax:  [phone]  Physical Therapy Treatment  Patient Details  Name: Stephen Diaz MRN: [id number] Date of Birth: Nov 21, 1955 Referring Provider (PT): Ophelia Charter, MD   Encounter Date: 10/18/2020   PT End of Session - 10/18/20 0957    Visit Number 9    Number of Visits 24    Date for PT Re-Evaluation 12/15/20    Authorization Type MCR/MCD; [facility] and Progress note every 10th visit; Kx after 15    PT Start Time 0945    PT Stop Time 1027    PT Time Calculation (min) 42 min    Activity Tolerance Patient tolerated treatment well    Behavior During Therapy [facility] for tasks assessed/performed           Past Medical History:  Diagnosis Date  . Arthritis    right shoulder  . COPD (chronic obstructive pulmonary disease) ([place])   . Diabetes mellitus without complication ([place])   . Hypertension   . Right foot drop    from pinched nerve in back, wears brace    Past Surgical History:  Procedure Laterality Date  . CATARACT EXTRACTION Left   . CATARACT EXTRACTION W/PHACO Right 08/13/2020   Procedure: CATARACT EXTRACTION PHACO AND INTRAOCULAR LENS PLACEMENT (IOC);  Surgeon: Baruch Goldmann, MD;  Location: [facility];  Service: Ophthalmology;  Laterality: Right;  CDE: 8.24  . HERNIA REPAIR    . REVERSE SHOULDER ARTHROPLASTY Right 09/08/2020   Procedure: REVERSE TOTAL SHOULDER ARTHROPLASTY;  Surgeon: Hiram Gash, MD;  Location: [place];  Service: Orthopedics;  Laterality: Right;  . screws and steel plate in neck  (Z1-6)     10-12 years ago  x2    There were no vitals filed for this visit.   Subjective Assessment - 10/18/20 0948    Subjective COVID-19 screening performed upon arrival.  Patient arrived with some ongoing discomfort yet overall well.    Patient Stated Goals get it back to normal    Currently in Pain? Yes    Pain Score 5     Pain Location  Shoulder    Pain Orientation Right    Pain Descriptors / Indicators Sore;Discomfort    Pain Type Surgical pain    Pain Onset More than a month ago    Pain Frequency Constant    Aggravating Factors  shoulder movement    Pain Relieving Factors rest              [facility] PT Assessment - 10/18/20 0001      AROM   AROM Assessment Site Shoulder    Right/Left Shoulder Right    Right Shoulder External Rotation 47 Degrees      PROM   PROM Assessment Site Shoulder    Right/Left Shoulder Right    Right Shoulder Flexion 152 Degrees    Right Shoulder External Rotation 55 Degrees                         [facility] Adult PT Treatment/Exercise - 10/18/20 0001      Shoulder Exercises: Supine   Other Supine Exercises supin AAROM with cane chest press and flexion 2x10 each      Shoulder Exercises: Pulleys   Flexion 3 minutes;1 minute    Other Pulley Exercises wall ladder x62min      Vasopneumatic   Number Minutes Vasopneumatic  10 minutes  Vasopnuematic Location  Shoulder    Vasopneumatic Pressure Low    Vasopneumatic Temperature  34      Manual Therapy   Manual Therapy Passive ROM    Passive ROM PROM of R shoulder into flex, ER with gentle holds at end range to improve mobility                       PT Long Term Goals - 10/14/20 1206      PT LONG TERM GOAL #1   Title Ind with advanced HEP for ROM and strengthening to improve function.    Status On-going      PT LONG TERM GOAL #2   Title Improved right shoulder ROM to [facility] to perform ADLS within restrictions.    Status On-going      PT LONG TERM GOAL #3   Title Pt able to perform ADLS with functional R shoulder strength.    Status On-going      PT LONG TERM GOAL #4   Title Decreased pain in right shoulder by >= 80% with ADLS.    Status On-going                 Plan - 10/18/20 1018    Clinical Impression Statement Patient tolerated treatment well today. Patient progressing with ROM and  AAROM activities. Patient has some ongoing discomfort yet overall feels improvement and able to put hair into ponytail which was self goal. Patient current goals progressing per protocol.    Examination-Activity Limitations Hygiene/Grooming    Examination-Participation Restrictions Cleaning;Yard Work    Stability/Clinical Decision Making Stable/Uncomplicated    Rehab Potential Excellent    PT Frequency 2x / week    PT Duration 12 weeks    PT Treatment/Interventions ADLs/Self Care Home Management;Cryotherapy;Electrical Stimulation;Therapeutic exercise;Neuromuscular re-education;Patient/family education;Manual techniques;Dry needling;Passive range of motion;Vasopneumatic Device    PT Next Visit Plan PROM/AAROM/AROM; light passive stretching at end range; begin resisted bands ER/FF/ABD concentric only;  NO IR OR EXT x 12 wks (6 weeks 10/20/20)    Consulted and Agree with Plan of Care Patient           Patient will benefit from skilled therapeutic intervention in order to improve the following deficits and impairments:  Decreased range of motion,Impaired UE functional use,Pain,Decreased strength,Increased edema,Postural dysfunction  Visit Diagnosis: Acute pain of right shoulder  Muscle weakness (generalized)  Localized edema  Stiffness of right shoulder, not elsewhere classified     Problem List Patient Active Problem List   Diagnosis Date Noted  . 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/08/2020    Phillips Climes, PTA 10/18/2020, 10:28 AM  [facility] [place], [place], [address] Phone: [phone]   Fax:  [phone]  Name: Stephen Diaz MRN: [id number] Date of Birth: Dec 29, 1955

## 2020-10-21 ENCOUNTER — Ambulatory Visit: Payer: Medicare Other | Admitting: Physical Therapy

## 2020-10-21 ENCOUNTER — Other Ambulatory Visit: Payer: Self-pay

## 2020-10-21 DIAGNOSIS — M25511 Pain in right shoulder: Secondary | ICD-10-CM

## 2020-10-21 DIAGNOSIS — M25611 Stiffness of right shoulder, not elsewhere classified: Secondary | ICD-10-CM

## 2020-10-21 DIAGNOSIS — M6281 Muscle weakness (generalized): Secondary | ICD-10-CM

## 2020-10-21 DIAGNOSIS — R600 Localized edema: Secondary | ICD-10-CM

## 2020-10-21 NOTE — Therapy (Signed)
[facility] [place], [place], [address] Phone: [phone]   Fax:  [phone]  Physical Therapy Treatment  Patient Details  Name: MELVIN WHITEFORD MRN: [id number] Date of Birth: [birth year] Referring Provider (PT): Ophelia Charter, MD   Encounter Date: 10/21/2020   PT End of Session - 10/21/20 1022    Visit Number 10    Number of Visits 24    Date for PT Re-Evaluation 12/15/20    Authorization Type MCR/MCD; [facility] 10th visit score 53 and Progress note every 10th visit; Kx after 15    PT Start Time 0945    PT Stop Time 1026    PT Time Calculation (min) 41 min    Activity Tolerance Patient tolerated treatment well    Behavior During Therapy [facility] for tasks assessed/performed           Past Medical History:  Diagnosis Date  . Arthritis    right shoulder  . COPD (chronic obstructive pulmonary disease) ([place])   . Diabetes mellitus without complication ([place])   . Hypertension   . Right foot drop    from pinched nerve in back, wears brace    Past Surgical History:  Procedure Laterality Date  . CATARACT EXTRACTION Left   . CATARACT EXTRACTION W/PHACO Right 08/13/2020   Procedure: CATARACT EXTRACTION PHACO AND INTRAOCULAR LENS PLACEMENT (IOC);  Surgeon: Baruch Goldmann, MD;  Location: [facility];  Service: Ophthalmology;  Laterality: Right;  CDE: 8.24  . HERNIA REPAIR    . REVERSE SHOULDER ARTHROPLASTY Right 09/08/2020   Procedure: REVERSE TOTAL SHOULDER ARTHROPLASTY;  Surgeon: Hiram Gash, MD;  Location: [place];  Service: Orthopedics;  Laterality: Right;  . screws and steel plate in neck  (I7-8)     10-12 years ago  x2    There were no vitals filed for this visit.   Subjective Assessment - 10/21/20 0948    Subjective COVID-19 screening performed upon arrival.  Patient arrived doing well.    Patient Stated Goals get it back to normal    Currently in Pain? Yes    Pain Score 5     Pain Location Shoulder     Pain Orientation Right    Pain Onset More than a month ago    Pain Frequency Constant    Aggravating Factors  shoulder movement    Pain Relieving Factors rest              [facility] PT Assessment - 10/21/20 0001      AROM   AROM Assessment Site Shoulder    Right/Left Shoulder Right    Right Shoulder External Rotation 60 Degrees      PROM   PROM Assessment Site Shoulder    Right/Left Shoulder Right    Right Shoulder External Rotation 65 Degrees                         [facility] Adult PT Treatment/Exercise - 10/21/20 0001      Shoulder Exercises: Supine   Protraction AROM;Right;20 reps    Flexion AROM;Right;20 reps      Shoulder Exercises: Standing   External Rotation Strengthening;5 reps;Theraband    Theraband Level (Shoulder External Rotation) Level 1 (Yellow)    External Rotation Limitations concentric only, limited due to weakness    Flexion Strengthening;Right;20 reps;Theraband    Theraband Level (Shoulder Flexion) Level 1 ([place]ellow)    Flexion Limitations concentric only    ABduction Strengthening;Right;20 reps;Theraband  Theraband Level (Shoulder ABduction) Level 1 (Yellow)    ABduction Limitations concentric only      Shoulder Exercises: Pulleys   Flexion 3 minutes;1 minute    Other Pulley Exercises wall ladder x101min    Other Pulley Exercises wall flexion 3x10      Shoulder Exercises: Isometric Strengthening   External Rotation Other (comment)   5sec x20     Vasopneumatic   Number Minutes Vasopneumatic  10 minutes    Vasopnuematic Location  Shoulder    Vasopneumatic Pressure Low    Vasopneumatic Temperature  34      Manual Therapy   Manual Therapy Passive ROM    Manual therapy comments rhythmic stabs for ER in scaption    Passive ROM PROM of R shoulder into flex, ER with gentle holds at end range to improve mobility                       PT Long Term Goals - 10/21/20 1010      PT LONG TERM GOAL #1   Title Ind with advanced HEP  for ROM and strengthening to improve function.    Time 12    Period Weeks    Status On-going      PT LONG TERM GOAL #2   Title Improved right shoulder ROM to [facility] to perform ADLS within restrictions.    Time 12    Period Weeks    Status On-going      PT LONG TERM GOAL #3   Title Pt able to perform ADLS with functional R shoulder strength.    Baseline strength NT 10/21/20    Time 12    Period Weeks    Status On-going      PT LONG TERM GOAL #4   Title Decreased pain in right shoulder by >= 80% with ADLS.    Baseline Patient has some ongoing limitations 10/21/20    Time 12    Period Weeks    Status On-going      PT LONG TERM GOAL #5   Title Improved FOTO to 63    Baseline 10th visit score 53 10/21/20    Time 12    Period Weeks    Status On-going                 Plan - 10/21/20 1023    Clinical Impression Statement Patient tolerated treatment well today with some ongoing soreness. Patient has been able to use right UE for light ADL's yet ongoing restrictions. Patient progressing with PRE's per protocol limitations. ER is weak and focused on isometrics and rythmic stabs today. Goals progressing this week.    Examination-Activity Limitations Hygiene/Grooming    Examination-Participation Restrictions Cleaning;Yard Work    Stability/Clinical Decision Making Stable/Uncomplicated    Rehab Potential Excellent    PT Frequency 2x / week    PT Duration 12 weeks    PT Treatment/Interventions ADLs/Self Care Home Management;Cryotherapy;Electrical Stimulation;Therapeutic exercise;Neuromuscular re-education;Patient/family education;Manual techniques;Dry needling;Passive range of motion;Vasopneumatic Device    PT Next Visit Plan PROM/AAROM/AROM; light passive stretching at end range; begin resisted bands ER/FF/ABD concentric only;  NO IR OR EXT x 12 wks (6 weeks 10/20/20)    Consulted and Agree with Plan of Care Patient           Patient will benefit from skilled therapeutic  intervention in order to improve the following deficits and impairments:  Decreased range of motion,Impaired UE functional use,Pain,Decreased strength,Increased edema,Postural dysfunction  Visit Diagnosis:  Acute pain of right shoulder  Muscle weakness (generalized)  Localized edema  Stiffness of right shoulder, not elsewhere classified     Problem List Patient Active Problem List   Diagnosis Date Noted  . 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/08/2020    Ladean Raya, PTA 10/21/20 10:30 AM  [facility] [place], [place], [address] Phone: [phone]   Fax:  [phone]  Name: SANDRO BURGO MRN: [id number] Date of Birth: 1955-06-13  Progress Note Reporting Period 09/22/20 to 10/21/20.  See note below for Objective Data and Assessment of Progress/Goals. Patient progressing nicely per protocol.    Mali Applegate MPT

## 2020-10-25 ENCOUNTER — Ambulatory Visit: Payer: Medicare Other | Admitting: Physical Therapy

## 2020-10-25 ENCOUNTER — Other Ambulatory Visit: Payer: Self-pay

## 2020-10-25 DIAGNOSIS — R600 Localized edema: Secondary | ICD-10-CM

## 2020-10-25 DIAGNOSIS — M25511 Pain in right shoulder: Secondary | ICD-10-CM | POA: Diagnosis not present

## 2020-10-25 DIAGNOSIS — M25611 Stiffness of right shoulder, not elsewhere classified: Secondary | ICD-10-CM

## 2020-10-25 DIAGNOSIS — M6281 Muscle weakness (generalized): Secondary | ICD-10-CM

## 2020-10-25 NOTE — Therapy (Signed)
[facility] [place], [place], [address] Phone: [phone]   Fax:  [phone]  Physical Therapy Treatment  Patient Details  Name: Stephen Diaz MRN: [id number] Date of Birth: Apr 22, 1956 Referring Provider (PT): Ophelia Charter, MD   Encounter Date: 10/25/2020   PT End of Session - 10/25/20 1025    Visit Number 11    Number of Visits 24    Date for PT Re-Evaluation 12/15/20    Authorization Type MCR/MCD; FOTO 10th visit score 53 and Progress note every 10th visit; Kx after 15    PT Start Time 0945    PT Stop Time 1031    PT Time Calculation (min) 46 min    Activity Tolerance Patient tolerated treatment well    Behavior During Therapy [facility] for tasks assessed/performed           Past Medical History:  Diagnosis Date  . Arthritis    right shoulder  . COPD (chronic obstructive pulmonary disease) ([place])   . Diabetes mellitus without complication ([place])   . Hypertension   . Right foot drop    from pinched nerve in back, wears brace    Past Surgical History:  Procedure Laterality Date  . CATARACT EXTRACTION Left   . CATARACT EXTRACTION W/PHACO Right 08/13/2020   Procedure: CATARACT EXTRACTION PHACO AND INTRAOCULAR LENS PLACEMENT (IOC);  Surgeon: Baruch Goldmann, MD;  Location: [facility];  Service: Ophthalmology;  Laterality: Right;  CDE: 8.24  . HERNIA REPAIR    . REVERSE SHOULDER ARTHROPLASTY Right 09/08/2020   Procedure: REVERSE TOTAL SHOULDER ARTHROPLASTY;  Surgeon: Hiram Gash, MD;  Location: [place];  Service: Orthopedics;  Laterality: Right;  . screws and steel plate in neck  (E1-7)     10-12 years ago  x2    There were no vitals filed for this visit.   Subjective Assessment - 10/25/20 0950    Subjective COVID-19 screening performed upon arrival.  Patient arrived doing well with shoulder yet breathing issues    Patient Stated Goals get it back to normal    Currently in Pain? Yes    Pain Score 5      Pain Location Shoulder    Pain Orientation Right    Pain Descriptors / Indicators Discomfort    Pain Type Surgical pain    Pain Onset More than a month ago    Pain Frequency Constant    Aggravating Factors  prolong activity    Pain Relieving Factors rest              [facility] PT Assessment - 10/25/20 0001      AROM   AROM Assessment Site Shoulder    Right/Left Shoulder Right    Right Shoulder External Rotation 65 Degrees      PROM   PROM Assessment Site Shoulder    Right/Left Shoulder Right    Right Shoulder External Rotation 70 Degrees                         [facility] Adult PT Treatment/Exercise - 10/25/20 0001      Shoulder Exercises: Supine   Protraction AROM;Right;20 reps    Flexion AROM;Right;20 reps      Shoulder Exercises: Standing   External Rotation Strengthening;Theraband;10 reps    Theraband Level (Shoulder External Rotation) Level 1 (Yellow)    External Rotation Limitations concentric only, limited due to weakness    Flexion Strengthening;Right;Theraband;10 reps    Theraband  Level (Shoulder Flexion) Level 1 (Yellow)    Flexion Limitations concentric only    ABduction Strengthening;Right;Theraband;10 reps    Theraband Level (Shoulder ABduction) Level 1 (Yellow)    ABduction Limitations concentric only      Shoulder Exercises: Pulleys   Flexion 5 minutes    Other Pulley Exercises wall ladder x62min      Shoulder Exercises: Isometric Strengthening   External Rotation Other (comment)   x20 10sec hold     Vasopneumatic   Number Minutes Vasopneumatic  10 minutes    Vasopnuematic Location  Shoulder    Vasopneumatic Pressure Low    Vasopneumatic Temperature  34      Manual Therapy   Manual Therapy Passive ROM    Manual therapy comments rhythmic stabs for ER in scaption    Passive ROM PROM of R shoulder into flex, ER with gentle holds at end range to improve mobility                       PT Long Term Goals - 10/25/20 1026       PT LONG TERM GOAL #1   Title Ind with advanced HEP for ROM and strengthening to improve function.    Time 12    Period Weeks    Status On-going      PT LONG TERM GOAL #2   Title Improved right shoulder ROM to [facility] to perform ADLS within restrictions.    Time 12    Period Weeks    Status On-going      PT LONG TERM GOAL #3   Title Pt able to perform ADLS with functional R shoulder strength.    Baseline strength NT 10/25/20    Time 12    Period Weeks    Status On-going      PT LONG TERM GOAL #4   Title Decreased pain in right shoulder by >= 80% with ADLS.    Baseline Patient has some ongoing limitations 10/25/20    Time 12    Period Weeks    Status On-going      PT LONG TERM GOAL #5   Title Improved FOTO to 63    Baseline 10th visit score 53 10/21/20    Time 12    Period Weeks    Status On-going                 Plan - 10/25/20 1027    Clinical Impression Statement Patient tolerated treatment well today. Patient able to perform all exercises today with good form and no pain. Patient has improved with ER today. Patient goals progressing today.    Examination-Activity Limitations Hygiene/Grooming    Examination-Participation Restrictions Cleaning;Yard Work    Stability/Clinical Decision Making Stable/Uncomplicated    Rehab Potential Excellent    PT Frequency 2x / week    PT Duration 12 weeks    PT Treatment/Interventions ADLs/Self Care Home Management;Cryotherapy;Electrical Stimulation;Therapeutic exercise;Neuromuscular re-education;Patient/family education;Manual techniques;Dry needling;Passive range of motion;Vasopneumatic Device    PT Next Visit Plan PROM/AAROM/AROM; light passive stretching at end range; begin resisted bands ER/FF/ABD concentric only;  NO IR OR EXT x 12 wks (6 weeks 10/20/20)    Consulted and Agree with Plan of Care Patient           Patient will benefit from skilled therapeutic intervention in order to improve the following deficits and  impairments:  Decreased range of motion,Impaired UE functional use,Pain,Decreased strength,Increased edema,Postural dysfunction  Visit Diagnosis: Acute pain of right  shoulder  Muscle weakness (generalized)  Localized edema  Stiffness of right shoulder, not elsewhere classified     Problem List Patient Active Problem List   Diagnosis Date Noted  . 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/08/2020    Phillips Climes, PTA 10/25/2020, 10:32 AM  [facility] [place], [place], [address] Phone: [phone]   Fax:  [phone]  Name: Stephen Diaz MRN: [id number] Date of Birth: 09-27-1955

## 2020-10-26 DIAGNOSIS — Z1211 Encounter for screening for malignant neoplasm of colon: Secondary | ICD-10-CM | POA: Insufficient documentation

## 2020-10-28 ENCOUNTER — Other Ambulatory Visit: Payer: Self-pay

## 2020-10-28 ENCOUNTER — Encounter: Payer: Self-pay | Admitting: Physical Therapy

## 2020-10-28 ENCOUNTER — Ambulatory Visit: Payer: Medicare Other | Admitting: Physical Therapy

## 2020-10-28 DIAGNOSIS — M6281 Muscle weakness (generalized): Secondary | ICD-10-CM

## 2020-10-28 DIAGNOSIS — M25611 Stiffness of right shoulder, not elsewhere classified: Secondary | ICD-10-CM

## 2020-10-28 DIAGNOSIS — M25511 Pain in right shoulder: Secondary | ICD-10-CM | POA: Diagnosis not present

## 2020-10-28 DIAGNOSIS — R600 Localized edema: Secondary | ICD-10-CM

## 2020-10-28 NOTE — Therapy (Signed)
[facility] [place], [place], [address] Phone: [phone]   Fax:  [phone]  Physical Therapy Treatment  Patient Details  Name: Stephen Diaz MRN: [id number] Date of Birth: 1956/03/05 Referring Provider (PT): Ophelia Charter, MD   Encounter Date: 10/28/2020   PT End of Session - 10/28/20 1001    Visit Number 12    Number of Visits 24    Date for PT Re-Evaluation 12/15/20    Authorization Type MCR/MCD; FOTO 10th visit score 53 and Progress note every 10th visit; Kx after 15    Progress Note Due on Visit 10    PT Start Time 0946    PT Stop Time 1027    PT Time Calculation (min) 41 min    Activity Tolerance Patient tolerated treatment well    Behavior During Therapy [facility] for tasks assessed/performed           Past Medical History:  Diagnosis Date  . Arthritis    right shoulder  . COPD (chronic obstructive pulmonary disease) ([place])   . Diabetes mellitus without complication ([place])   . Hypertension   . Right foot drop    from pinched nerve in back, wears brace    Past Surgical History:  Procedure Laterality Date  . CATARACT EXTRACTION Left   . CATARACT EXTRACTION W/PHACO Right 08/13/2020   Procedure: CATARACT EXTRACTION PHACO AND INTRAOCULAR LENS PLACEMENT (IOC);  Surgeon: Baruch Goldmann, MD;  Location: [facility];  Service: Ophthalmology;  Laterality: Right;  CDE: 8.24  . HERNIA REPAIR    . REVERSE SHOULDER ARTHROPLASTY Right 09/08/2020   Procedure: REVERSE TOTAL SHOULDER ARTHROPLASTY;  Surgeon: Hiram Gash, MD;  Location: [place];  Service: Orthopedics;  Laterality: Right;  . screws and steel plate in neck  (A8-3)     10-12 years ago  x2    There were no vitals filed for this visit.   Subjective Assessment - 10/28/20 0957    Subjective COVID-19 screening performed upon arrival. Reports that his shoulder is doing good today.    Patient Stated Goals get it back to normal    Currently in Pain? Yes     Pain Score 4     Pain Location Shoulder    Pain Orientation Right    Pain Descriptors / Indicators Discomfort    Pain Type Surgical pain    Pain Onset More than a month ago    Pain Frequency Intermittent              [facility] PT Assessment - 10/28/20 0001      Assessment   Medical Diagnosis right reverse TSA    Referring Provider (PT) Ophelia Charter, MD    Onset Date/Surgical Date 09/08/20    Hand Dominance Right    Next MD Visit 12/21/2020    Prior Therapy no      Precautions   Precautions Shoulder    Precaution Comments avoid any IR and extension; no resisted IR/ext x 3 months      Restrictions   Weight Bearing Restrictions No                         [facility] Adult PT Treatment/Exercise - 10/28/20 0001      Shoulder Exercises: Supine   Protraction AAROM;Both;20 reps    External Rotation AAROM;Right;10 reps    Flexion AAROM;Both;20 reps      Shoulder Exercises: Pulleys   Flexion 5 minutes  Other Pulley Exercises wall ladder x64min      Shoulder Exercises: ROM/Strengthening   [place] seated; into flex, circles x4 min total      Modalities   Modalities Vasopneumatic      Vasopneumatic   Number Minutes Vasopneumatic  10 minutes    Vasopnuematic Location  Shoulder    Vasopneumatic Pressure Low    Vasopneumatic Temperature  34      Manual Therapy   Manual Therapy Passive ROM    Passive ROM PROM of R shoulder into flex, ER with gentle holds at end range to improve mobility                       PT Long Term Goals - 10/25/20 1026      PT LONG TERM GOAL #1   Title Ind with advanced HEP for ROM and strengthening to improve function.    Time 12    Period Weeks    Status On-going      PT LONG TERM GOAL #2   Title Improved right shoulder ROM to [facility] to perform ADLS within restrictions.    Time 12    Period Weeks    Status On-going      PT LONG TERM GOAL #3   Title Pt able to perform ADLS with functional R shoulder strength.     Baseline strength NT 10/25/20    Time 12    Period Weeks    Status On-going      PT LONG TERM GOAL #4   Title Decreased pain in right shoulder by >= 80% with ADLS.    Baseline Patient has some ongoing limitations 10/25/20    Time 12    Period Weeks    Status On-going      PT LONG TERM GOAL #5   Title Improved FOTO to 63    Baseline 10th visit score 53 10/21/20    Time 12    Period Weeks    Status On-going                 Plan - 10/28/20 1026    Clinical Impression Statement Patient presented in clinic with reports of less shoulder pain but has limited his activity due to breathing difficulites. Patient progressed through [facility] exercises which he is able to tolerate very well with no complaints of increased pain. Limited understanding of correction for ER AAROM technique thus terminated. Firm end feels and smooth arc of motion noted during PROM of R shoulder. Normal vasopneumatic response noted following removal of the modality.    Examination-Activity Limitations Hygiene/Grooming    Examination-Participation Restrictions Cleaning;Yard Work    Stability/Clinical Decision Making Stable/Uncomplicated    Rehab Potential Excellent    PT Frequency 2x / week    PT Duration 12 weeks    PT Treatment/Interventions ADLs/Self Care Home Management;Cryotherapy;Electrical Stimulation;Therapeutic exercise;Neuromuscular re-education;Patient/family education;Manual techniques;Dry needling;Passive range of motion;Vasopneumatic Device    PT Next Visit Plan PROM/AAROM/AROM; light passive stretching at end range; begin resisted bands ER/FF/ABD concentric only;  NO IR OR EXT x 12 wks (6 weeks 10/20/20)    PT Home Exercise Plan [id number] - AAROM; active scap retraction with ER    Consulted and Agree with Plan of Care Patient           Patient will benefit from skilled therapeutic intervention in order to improve the following deficits and impairments:  Decreased range of motion,Impaired UE functional  use,Pain,Decreased strength,Increased edema,Postural dysfunction  Visit Diagnosis: Acute  pain of right shoulder  Muscle weakness (generalized)  Localized edema  Stiffness of right shoulder, not elsewhere classified     Problem List Patient Active Problem List   Diagnosis Date Noted  . 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/08/2020    Standley Brooking, PTA 10/28/2020, 10:30 AM  [facility] [place], [place], [address] Phone: [phone]   Fax:  [phone]  Name: Stephen Diaz MRN: [id number] Date of Birth: 1955/06/13

## 2020-11-01 ENCOUNTER — Other Ambulatory Visit: Payer: Self-pay

## 2020-11-01 ENCOUNTER — Ambulatory Visit: Payer: Medicare Other | Admitting: Physical Therapy

## 2020-11-01 DIAGNOSIS — M25511 Pain in right shoulder: Secondary | ICD-10-CM

## 2020-11-01 DIAGNOSIS — M25611 Stiffness of right shoulder, not elsewhere classified: Secondary | ICD-10-CM

## 2020-11-01 DIAGNOSIS — R600 Localized edema: Secondary | ICD-10-CM

## 2020-11-01 DIAGNOSIS — M6281 Muscle weakness (generalized): Secondary | ICD-10-CM

## 2020-11-01 NOTE — Therapy (Signed)
[facility] [place], [place], [address] Phone: [phone]   Fax:  [phone]  Physical Therapy Treatment  Patient Details  Name: Stephen Diaz MRN: [id number] Date of Birth: 29-Dec-1955 Referring Provider (PT): Ophelia Charter, MD   Encounter Date: 11/01/2020   PT End of Session - 11/01/20 1022    Visit Number 13    Number of Visits 24    Date for PT Re-Evaluation 12/15/20    Authorization Type MCR/MCD; FOTO 10th visit score 53 and Progress note every 10th visit; Kx after 15    PT Start Time 0945    PT Stop Time 1026    PT Time Calculation (min) 41 min    Activity Tolerance Patient tolerated treatment well    Behavior During Therapy [facility] for tasks assessed/performed           Past Medical History:  Diagnosis Date  . Arthritis    right shoulder  . COPD (chronic obstructive pulmonary disease) ([place])   . Diabetes mellitus without complication ([place])   . Hypertension   . Right foot drop    from pinched nerve in back, wears brace    Past Surgical History:  Procedure Laterality Date  . CATARACT EXTRACTION Left   . CATARACT EXTRACTION W/PHACO Right 08/13/2020   Procedure: CATARACT EXTRACTION PHACO AND INTRAOCULAR LENS PLACEMENT (IOC);  Surgeon: Baruch Goldmann, MD;  Location: [facility];  Service: Ophthalmology;  Laterality: Right;  CDE: 8.24  . HERNIA REPAIR    . REVERSE SHOULDER ARTHROPLASTY Right 09/08/2020   Procedure: REVERSE TOTAL SHOULDER ARTHROPLASTY;  Surgeon: Hiram Gash, MD;  Location: [place];  Service: Orthopedics;  Laterality: Right;  . screws and steel plate in neck  (Q2-5)     10-12 years ago  x2    There were no vitals filed for this visit.   Subjective Assessment - 11/01/20 0946    Subjective COVID-19 screening performed upon arrival. Patient arrived with increase soreness after using a manual saw to cut down a tree    Patient Stated Goals get it back to normal    Currently in Pain?  Yes    Pain Score 5     Pain Location Shoulder    Pain Orientation Right    Pain Descriptors / Indicators Discomfort    Pain Type Surgical pain    Pain Onset More than a month ago    Pain Frequency Intermittent    Aggravating Factors  prolong activity and use of shoulder    Pain Relieving Factors at rest              [facility] PT Assessment - 11/01/20 0001      AROM   AROM Assessment Site Shoulder    Right/Left Shoulder Right    Right Shoulder External Rotation 65 Degrees      PROM   PROM Assessment Site Shoulder    Right/Left Shoulder Right    Right Shoulder External Rotation 70 Degrees                         [facility] Adult PT Treatment/Exercise - 11/01/20 0001      Shoulder Exercises: Standing   External Rotation Strengthening    Theraband Level (Shoulder External Rotation) Level 1 (Yellow)    External Rotation Limitations unable today due to weakness    Flexion Strengthening;Right;Theraband;15 reps    Theraband Level (Shoulder Flexion) Level 1 (Yellow)    Flexion Limitations  concentric only    ABduction Strengthening;Right;Theraband;15 reps    Theraband Level (Shoulder ABduction) Level 1 (Yellow)    ABduction Limitations concentric only      Shoulder Exercises: Pulleys   Flexion 5 minutes    Other Pulley Exercises wall ladder x11min      Shoulder Exercises: Isometric Strengthening   External Rotation Other (comment)   10se x10-20 reps     Vasopneumatic   Number Minutes Vasopneumatic  10 minutes    Vasopnuematic Location  Shoulder    Vasopneumatic Pressure Low    Vasopneumatic Temperature  34      Manual Therapy   Manual Therapy Passive ROM    Passive ROM PROM of R shoulder into flex, ER with gentle holds at end range to improve mobility                       PT Long Term Goals - 10/25/20 1026      PT LONG TERM GOAL #1   Title Ind with advanced HEP for ROM and strengthening to improve function.    Time 12    Period Weeks     Status On-going      PT LONG TERM GOAL #2   Title Improved right shoulder ROM to [facility] to perform ADLS within restrictions.    Time 12    Period Weeks    Status On-going      PT LONG TERM GOAL #3   Title Pt able to perform ADLS with functional R shoulder strength.    Baseline strength NT 10/25/20    Time 12    Period Weeks    Status On-going      PT LONG TERM GOAL #4   Title Decreased pain in right shoulder by >= 80% with ADLS.    Baseline Patient has some ongoing limitations 10/25/20    Time 12    Period Weeks    Status On-going      PT LONG TERM GOAL #5   Title Improved FOTO to 63    Baseline 10th visit score 53 10/21/20    Time 12    Period Weeks    Status On-going                 Plan - 11/01/20 1024    Clinical Impression Statement Patient tolerated treatment well today although some soreness after sawing a tree over weekend. Today patient able to tolerate exercises and has maintained ROM in right shoulder. Goals progresisng today.    Examination-Activity Limitations Hygiene/Grooming    Examination-Participation Restrictions Cleaning;Yard Work    Stability/Clinical Decision Making Stable/Uncomplicated    Rehab Potential Excellent    PT Frequency 2x / week    PT Duration 12 weeks    PT Treatment/Interventions ADLs/Self Care Home Management;Cryotherapy;Electrical Stimulation;Therapeutic exercise;Neuromuscular re-education;Patient/family education;Manual techniques;Dry needling;Passive range of motion;Vasopneumatic Device    PT Next Visit Plan PROM/AAROM/AROM; light passive stretching at end range; begin resisted bands ER/FF/ABD concentric only;  NO IR OR EXT x 12 wks (7 weeks 10/27/20)    Consulted and Agree with Plan of Care Patient           Patient will benefit from skilled therapeutic intervention in order to improve the following deficits and impairments:  Decreased range of motion,Impaired UE functional use,Pain,Decreased strength,Increased edema,Postural  dysfunction  Visit Diagnosis: Acute pain of right shoulder  Muscle weakness (generalized)  Localized edema  Stiffness of right shoulder, not elsewhere classified     Problem  List Patient Active Problem List   Diagnosis Date Noted  . 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/08/2020    Phillips Climes, PTA 11/01/2020, 10:35 AM  [facility] [place], [place], [address] Phone: [phone]   Fax:  [phone]  Name: LUISALBERTO BEEGLE MRN: [id number] Date of Birth: January 01, 1956

## 2020-11-04 ENCOUNTER — Ambulatory Visit: Payer: Medicare Other | Admitting: Physical Therapy

## 2020-11-04 ENCOUNTER — Other Ambulatory Visit: Payer: Self-pay

## 2020-11-04 DIAGNOSIS — R600 Localized edema: Secondary | ICD-10-CM

## 2020-11-04 DIAGNOSIS — M25611 Stiffness of right shoulder, not elsewhere classified: Secondary | ICD-10-CM

## 2020-11-04 DIAGNOSIS — M6281 Muscle weakness (generalized): Secondary | ICD-10-CM

## 2020-11-04 DIAGNOSIS — M25511 Pain in right shoulder: Secondary | ICD-10-CM | POA: Diagnosis not present

## 2020-11-04 NOTE — Therapy (Signed)
[facility] [place], [place], [address] Phone: [phone]   Fax:  [phone]  Physical Therapy Treatment  Patient Details  Name: Stephen Diaz MRN: [id number] Date of Birth: 1955-08-30 Referring Provider (PT): Ophelia Charter, MD   Encounter Date: 11/04/2020   PT End of Session - 11/04/20 1021    Visit Number 14    Number of Visits 24    Date for PT Re-Evaluation 12/15/20    Authorization Type MCR/MCD; FOTO 10th visit score 53 and Progress note every 10th visit; Kx after 15    PT Start Time 0945    PT Stop Time 1028    PT Time Calculation (min) 43 min    Activity Tolerance Patient tolerated treatment well    Behavior During Therapy [facility] for tasks assessed/performed           Past Medical History:  Diagnosis Date  . Arthritis    right shoulder  . COPD (chronic obstructive pulmonary disease) ([place])   . Diabetes mellitus without complication ([place])   . Hypertension   . Right foot drop    from pinched nerve in back, wears brace    Past Surgical History:  Procedure Laterality Date  . CATARACT EXTRACTION Left   . CATARACT EXTRACTION W/PHACO Right 08/13/2020   Procedure: CATARACT EXTRACTION PHACO AND INTRAOCULAR LENS PLACEMENT (IOC);  Surgeon: Baruch Goldmann, MD;  Location: [facility];  Service: Ophthalmology;  Laterality: Right;  CDE: 8.24  . HERNIA REPAIR    . REVERSE SHOULDER ARTHROPLASTY Right 09/08/2020   Procedure: REVERSE TOTAL SHOULDER ARTHROPLASTY;  Surgeon: Hiram Gash, MD;  Location: [place];  Service: Orthopedics;  Laterality: Right;  . screws and steel plate in neck  (T6-5)     10-12 years ago  x2    There were no vitals filed for this visit.   Subjective Assessment - 11/04/20 0948    Subjective COVID-19 screening performed upon arrival. Patient reported doing well today.    Patient Stated Goals get it back to normal    Currently in Pain? Yes    Pain Score 4     Pain Location Shoulder     Pain Orientation Right    Pain Descriptors / Indicators Discomfort    Pain Type Surgical pain    Pain Onset More than a month ago    Pain Frequency Intermittent    Aggravating Factors  certain activity with shoulder    Pain Relieving Factors rest              [facility] PT Assessment - 11/04/20 0001      AROM   AROM Assessment Site Shoulder    Right/Left Shoulder Right    Right Shoulder External Rotation 70 Degrees      PROM   PROM Assessment Site Shoulder    Right/Left Shoulder Right    Right Shoulder Flexion 145 Degrees    Right Shoulder External Rotation 75 Degrees                         [facility] Adult PT Treatment/Exercise - 11/04/20 0001      Shoulder Exercises: Supine   Protraction AROM;Right;20 reps      Shoulder Exercises: Standing   External Rotation Strengthening;AROM;Right;20 reps    External Rotation Limitations no resistance    Flexion Strengthening;Right;Theraband;15 reps    Theraband Level (Shoulder Flexion) Level 2 (Red)    Flexion Limitations concentric only  ABduction Strengthening;Right;Theraband;15 reps    Theraband Level (Shoulder ABduction) Level 2 (Red)    ABduction Limitations concentric only      Shoulder Exercises: Pulleys   Flexion 5 minutes    Other Pulley Exercises UE ranger for elevation x46min      Vasopneumatic   Number Minutes Vasopneumatic  10 minutes    Vasopnuematic Location  Shoulder    Vasopneumatic Pressure Low    Vasopneumatic Temperature  34      Manual Therapy   Manual Therapy Passive ROM    Manual therapy comments rhythmic stabs for ER in scaption    Passive ROM PROM of R shoulder into flex, ER with gentle holds at end range to improve mobility                       PT Long Term Goals - 11/04/20 0951      PT LONG TERM GOAL #1   Title Ind with advanced HEP for ROM and strengthening to improve function.    Time 12    Period Weeks    Status On-going      PT LONG TERM GOAL #2   Title  Improved right shoulder ROM to [facility] to perform ADLS within restrictions.    Time 12    Period Weeks    Status On-going      PT LONG TERM GOAL #3   Title Pt able to perform ADLS with functional R shoulder strength.    Baseline strength NT 11/04/20    Time 12    Period Weeks    Status On-going      PT LONG TERM GOAL #4   Title Decreased pain in right shoulder by >= 80% with ADLS.    Baseline Patient has some ongoing limitations 11/04/20    Time 12    Period Weeks    Status On-going      PT LONG TERM GOAL #5   Title Improved FOTO to 63    Baseline 10th visit score 53 10/21/20    Time 12    Period Weeks    Status On-going                 Plan - 11/04/20 1027    Clinical Impression Statement Patient tolerated treatment well today. Patient has improved with ROM in right shoulder today. Patient progressing per protocol with ongoing limitations with ER strength. Today focused on progression within protocol and rhythmic stabs for ER in scaption to improve strength. Patient goals progressing.    Examination-Activity Limitations Hygiene/Grooming    Examination-Participation Restrictions Cleaning;Yard Work    Stability/Clinical Decision Making Stable/Uncomplicated    Rehab Potential Excellent    PT Frequency 2x / week    PT Duration 12 weeks    PT Treatment/Interventions ADLs/Self Care Home Management;Cryotherapy;Electrical Stimulation;Therapeutic exercise;Neuromuscular re-education;Patient/family education;Manual techniques;Dry needling;Passive range of motion;Vasopneumatic Device    PT Next Visit Plan PROM/AAROM/AROM; light passive stretching at end range; begin resisted bands ER/FF/ABD concentric only;  NO IR OR EXT x 12 wks (8 weeks 11/03/20) MD F/U 11/21/20    Consulted and Agree with Plan of Care Patient           Patient will benefit from skilled therapeutic intervention in order to improve the following deficits and impairments:  Decreased range of motion,Impaired UE  functional use,Pain,Decreased strength,Increased edema,Postural dysfunction  Visit Diagnosis: Acute pain of right shoulder  Muscle weakness (generalized)  Localized edema  Stiffness of right shoulder, not elsewhere  classified     Problem List Patient Active Problem List   Diagnosis Date Noted  . 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/08/2020    Phillips Climes, PTA 11/04/2020, 10:34 AM  [facility] [place], [place], [address] Phone: [phone]   Fax:  [phone]  Name: ASANI MCBURNEY MRN: [id number] Date of Birth: [birth year]

## 2020-11-09 ENCOUNTER — Ambulatory Visit: Payer: Medicare Other | Admitting: Physical Therapy

## 2020-11-09 ENCOUNTER — Other Ambulatory Visit: Payer: Self-pay

## 2020-11-09 ENCOUNTER — Encounter: Payer: Self-pay | Admitting: Physical Therapy

## 2020-11-09 DIAGNOSIS — M25611 Stiffness of right shoulder, not elsewhere classified: Secondary | ICD-10-CM

## 2020-11-09 DIAGNOSIS — M25511 Pain in right shoulder: Secondary | ICD-10-CM

## 2020-11-09 DIAGNOSIS — R600 Localized edema: Secondary | ICD-10-CM

## 2020-11-09 DIAGNOSIS — M6281 Muscle weakness (generalized): Secondary | ICD-10-CM

## 2020-11-09 NOTE — Therapy (Signed)
[facility] [place], [place], [address] Phone: [phone]   Fax:  [phone]  Physical Therapy Treatment  Patient Details  Name: Stephen Diaz MRN: [id number] Date of Birth: 27-Jul-1955 Referring Provider (PT): Ophelia Charter, MD   Encounter Date: 11/09/2020   PT End of Session - 11/09/20 0948    Visit Number 15    Number of Visits 24    Date for PT Re-Evaluation 12/15/20    Authorization Type MCR/MCD; FOTO 10th visit score 53 and Progress note every 10th visit; Kx after 15    Progress Note Due on Visit 10    PT Start Time 0946    PT Stop Time 1026    PT Time Calculation (min) 40 min    Activity Tolerance Patient tolerated treatment well    Behavior During Therapy [facility] for tasks assessed/performed           Past Medical History:  Diagnosis Date  . Arthritis    right shoulder  . COPD (chronic obstructive pulmonary disease) ([place])   . Diabetes mellitus without complication ([place])   . Hypertension   . Right foot drop    from pinched nerve in back, wears brace    Past Surgical History:  Procedure Laterality Date  . CATARACT EXTRACTION Left   . CATARACT EXTRACTION W/PHACO Right 08/13/2020   Procedure: CATARACT EXTRACTION PHACO AND INTRAOCULAR LENS PLACEMENT (IOC);  Surgeon: Baruch Goldmann, MD;  Location: [facility];  Service: Ophthalmology;  Laterality: Right;  CDE: 8.24  . HERNIA REPAIR    . REVERSE SHOULDER ARTHROPLASTY Right 09/08/2020   Procedure: REVERSE TOTAL SHOULDER ARTHROPLASTY;  Surgeon: Hiram Gash, MD;  Location: [place];  Service: Orthopedics;  Laterality: Right;  . screws and steel plate in neck  (G9-9)     10-12 years ago  x2    There were no vitals filed for this visit.   Subjective Assessment - 11/09/20 0946    Subjective COVID-19 screening performed upon arrival. Reports a little pain but "not bad."    Patient Stated Goals get it back to normal    Currently in Pain? Yes    Pain  Score 3     Pain Location Shoulder    Pain Orientation Right    Pain Descriptors / Indicators Discomfort    Pain Type Surgical pain    Pain Onset More than a month ago    Pain Frequency Intermittent              [facility] PT Assessment - 11/09/20 0001      Assessment   Medical Diagnosis right reverse TSA    Referring Provider (PT) Ophelia Charter, MD    Onset Date/Surgical Date 09/08/20    Hand Dominance Right    Next MD Visit 12/21/2020    Prior Therapy no      Precautions   Precautions Shoulder    Precaution Comments avoid any IR and extension; no resisted IR/ext x 3 months      Restrictions   Weight Bearing Restrictions No                         [facility] Adult PT Treatment/Exercise - 11/09/20 0001      Shoulder Exercises: Supine   Protraction AROM;Right;20 reps    Flexion AROM;Right;20 reps      Shoulder Exercises: Sidelying   External Rotation AROM;Right;20 reps      Shoulder Exercises: Standing  External Rotation Strengthening;Right;20 reps;Theraband    Theraband Level (Shoulder External Rotation) Level 1 (Yellow)    External Rotation Limitations limited by weakness    Flexion AROM;Right;20 reps    ABduction AROM;Right;20 reps    Row Strengthening;Right;20 reps;Theraband    Theraband Level (Shoulder Row) Level 2 (Red)    Row Limitations light range      Shoulder Exercises: Pulleys   Flexion 5 minutes      Shoulder Exercises: ROM/Strengthening   Wall Wash into flex x20 reps      Modalities   Modalities Vasopneumatic      Vasopneumatic   Number Minutes Vasopneumatic  10 minutes    Vasopnuematic Location  Shoulder    Vasopneumatic Pressure Low    Vasopneumatic Temperature  34      Manual Therapy   Manual Therapy Passive ROM    Passive ROM PROM of R shoulder into flex, ER with gentle holds at end range to improve mobility                       PT Long Term Goals - 11/04/20 0951      PT LONG TERM GOAL #1   Title Ind with  advanced HEP for ROM and strengthening to improve function.    Time 12    Period Weeks    Status On-going      PT LONG TERM GOAL #2   Title Improved right shoulder ROM to [facility] to perform ADLS within restrictions.    Time 12    Period Weeks    Status On-going      PT LONG TERM GOAL #3   Title Pt able to perform ADLS with functional R shoulder strength.    Baseline strength NT 11/04/20    Time 12    Period Weeks    Status On-going      PT LONG TERM GOAL #4   Title Decreased pain in right shoulder by >= 80% with ADLS.    Baseline Patient has some ongoing limitations 11/04/20    Time 12    Period Weeks    Status On-going      PT LONG TERM GOAL #5   Title Improved FOTO to 63    Baseline 10th visit score 53 10/21/20    Time 12    Period Weeks    Status On-going                 Plan - 11/09/20 1035    Clinical Impression Statement Patient progressed in clinic to more AROM exercises per protocol as well as introduced to light strengthening. Patient is still very weak with ER at this time. Greater cueing and emphasis on technique of various exercises. Patient did report minimal pain in anterior R shoulder with AROM flexion. Firm end feels and smooth arc of motion noted during PROM of R shoulder. Normal vasopneumatic response noted following removal of the modality.    Examination-Activity Limitations Hygiene/Grooming    Examination-Participation Restrictions Cleaning;Yard Work    Stability/Clinical Decision Making Stable/Uncomplicated    Rehab Potential Excellent    PT Frequency 2x / week    PT Duration 12 weeks    PT Treatment/Interventions ADLs/Self Care Home Management;Cryotherapy;Electrical Stimulation;Therapeutic exercise;Neuromuscular re-education;Patient/family education;Manual techniques;Dry needling;Passive range of motion;Vasopneumatic Device    PT Next Visit Plan PROM/AAROM/AROM; light passive stretching at end range; begin resisted bands ER/FF/ABD concentric only;  NO  IR OR EXT x 12 wks (8 weeks 11/03/20) MD F/U 11/21/20  PT Home Exercise Plan [id number] - AAROM; active scap retraction with ER    Consulted and Agree with Plan of Care Patient           Patient will benefit from skilled therapeutic intervention in order to improve the following deficits and impairments:  Decreased range of motion,Impaired UE functional use,Pain,Decreased strength,Increased edema,Postural dysfunction  Visit Diagnosis: Acute pain of right shoulder  Muscle weakness (generalized)  Localized edema  Stiffness of right shoulder, not elsewhere classified     Problem List Patient Active Problem List   Diagnosis Date Noted  . 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/08/2020    Standley Brooking, PTA 11/09/2020, 10:41 AM  [facility] [place], [place], [address] Phone: [phone]   Fax:  [phone]  Name: YOHANCE HATHORNE MRN: [id number] Date of Birth: Oct 24, 1955

## 2020-11-11 ENCOUNTER — Other Ambulatory Visit: Payer: Self-pay

## 2020-11-11 ENCOUNTER — Ambulatory Visit: Payer: Medicare Other | Attending: Orthopaedic Surgery | Admitting: Physical Therapy

## 2020-11-11 DIAGNOSIS — R600 Localized edema: Secondary | ICD-10-CM | POA: Diagnosis present

## 2020-11-11 DIAGNOSIS — M25511 Pain in right shoulder: Secondary | ICD-10-CM

## 2020-11-11 DIAGNOSIS — M25611 Stiffness of right shoulder, not elsewhere classified: Secondary | ICD-10-CM | POA: Diagnosis present

## 2020-11-11 DIAGNOSIS — M6281 Muscle weakness (generalized): Secondary | ICD-10-CM | POA: Diagnosis present

## 2020-11-11 NOTE — Therapy (Signed)
[facility] [place], [place], [address] Phone: [phone]   Fax:  [phone]  Physical Therapy Treatment  Patient Details  Name: Stephen Diaz MRN: [id number] Date of Birth: 08-17-1955 Referring Provider (PT): Ophelia Charter, MD   Encounter Date: 11/11/2020   PT End of Session - 11/11/20 1015    Visit Number 16    Number of Visits 24    Date for PT Re-Evaluation 12/15/20    Authorization Type MCR/MCD; FOTO 10th visit score 53 and Progress note every 10th visit; Kx after 15    PT Start Time 0945    PT Stop Time 1025    PT Time Calculation (min) 40 min    Activity Tolerance Patient tolerated treatment well    Behavior During Therapy [facility] for tasks assessed/performed           Past Medical History:  Diagnosis Date  . Arthritis    right shoulder  . COPD (chronic obstructive pulmonary disease) ([place])   . Diabetes mellitus without complication ([place])   . Hypertension   . Right foot drop    from pinched nerve in back, wears brace    Past Surgical History:  Procedure Laterality Date  . CATARACT EXTRACTION Left   . CATARACT EXTRACTION W/PHACO Right 08/13/2020   Procedure: CATARACT EXTRACTION PHACO AND INTRAOCULAR LENS PLACEMENT (IOC);  Surgeon: Baruch Goldmann, MD;  Location: [facility];  Service: Ophthalmology;  Laterality: Right;  CDE: 8.24  . HERNIA REPAIR    . REVERSE SHOULDER ARTHROPLASTY Right 09/08/2020   Procedure: REVERSE TOTAL SHOULDER ARTHROPLASTY;  Surgeon: Hiram Gash, MD;  Location: [place];  Service: Orthopedics;  Laterality: Right;  . screws and steel plate in neck  (T0-6)     10-12 years ago  x2    There were no vitals filed for this visit.   Subjective Assessment - 11/11/20 0953    Subjective COVID-19 screening performed upon arrival. Patient reported some soreness.    Patient Stated Goals get it back to normal    Currently in Pain? Yes    Pain Score 4     Pain Location Shoulder     Pain Orientation Right    Pain Descriptors / Indicators Discomfort    Pain Type Surgical pain    Pain Onset More than a month ago    Pain Frequency Intermittent    Aggravating Factors  certain movements    Pain Relieving Factors rest              [facility] PT Assessment - 11/11/20 0001      AROM   AROM Assessment Site Shoulder    Right/Left Shoulder Right    Right Shoulder Flexion 125 Degrees    Right Shoulder External Rotation 70 Degrees      PROM   PROM Assessment Site Shoulder    Right/Left Shoulder Right    Right Shoulder Flexion 145 Degrees    Right Shoulder External Rotation 75 Degrees                         [facility] Adult PT Treatment/Exercise - 11/11/20 0001      Shoulder Exercises: Supine   Protraction AROM;Right;20 reps    Flexion AROM;Right;20 reps      Shoulder Exercises: Sidelying   External Rotation AROM;Strengthening;Right    External Rotation Weight (lbs) 0    External Rotation Limitations 3x10      Shoulder Exercises: Standing  Flexion Strengthening;Right;20 reps;Theraband    Theraband Level (Shoulder Flexion) Level 2 (Red)    Flexion Limitations concentric only    ABduction Strengthening;Right;20 reps;Theraband    Theraband Level (Shoulder ABduction) Level 2 (Red)    ABduction Limitations concentric only    Other Standing Exercises AROM for flexion and scaption no weight 3x10 each      Shoulder Exercises: Pulleys   Flexion 5 minutes    Other Pulley Exercises wall ladder x76min      Vasopneumatic   Number Minutes Vasopneumatic  10 minutes    Vasopnuematic Location  Shoulder    Vasopneumatic Pressure Low    Vasopneumatic Temperature  34      Manual Therapy   Manual Therapy Passive ROM    Manual therapy comments measurements only                       PT Long Term Goals - 11/11/20 1012      PT LONG TERM GOAL #1   Title Ind with advanced HEP for ROM and strengthening to improve function.    Time 12    Period  Weeks    Status On-going      PT LONG TERM GOAL #2   Title Improved right shoulder ROM to [facility] to perform ADLS within restrictions.    Baseline AROM 125 degrees yet limited with weight retrictions 11/11/20    Time 12    Period Weeks    Status On-going      PT LONG TERM GOAL #3   Title Pt able to perform ADLS with functional R shoulder strength.    Baseline strength NT 11/11/20    Time 12    Period Weeks    Status On-going      PT LONG TERM GOAL #4   Title Decreased pain in right shoulder by >= 80% with ADLS.    Baseline Patient has some ongoing limitations due to weight restrictions 11/11/20    Time 12    Period Weeks    Status On-going      PT LONG TERM GOAL #5   Title Improved FOTO to 63    Baseline 10th visit score 53 10/21/20    Time 12    Period Weeks    Status On-going                 Plan - 11/11/20 1025    Clinical Impression Statement Patient tolerated treatment well today. Patient progressing with PRE's with protocol restrictions. Patient has some ongoing soreness with right shoulder. Patient PROM is WNL yet active limited due to weakness. Goals progressing this week.    Examination-Activity Limitations Hygiene/Grooming    Examination-Participation Restrictions Cleaning;Yard Work    Stability/Clinical Decision Making Stable/Uncomplicated    Rehab Potential Excellent    PT Frequency 2x / week    PT Duration 12 weeks    PT Treatment/Interventions ADLs/Self Care Home Management;Cryotherapy;Electrical Stimulation;Therapeutic exercise;Neuromuscular re-education;Patient/family education;Manual techniques;Dry needling;Passive range of motion;Vasopneumatic Device    PT Next Visit Plan PROM/AAROM/AROM; light passive stretching at end range; begin resisted bands ER/FF/ABD concentric only;  NO IR OR EXT x 12 wks (8 weeks 11/03/20) MD F/U 12/21/20    Consulted and Agree with Plan of Care Patient           Patient will benefit from skilled therapeutic intervention in order  to improve the following deficits and impairments:  Decreased range of motion,Impaired UE functional use,Pain,Decreased strength,Increased edema,Postural dysfunction  Visit Diagnosis:  Acute pain of right shoulder  Muscle weakness (generalized)  Localized edema  Stiffness of right shoulder, not elsewhere classified     Problem List Patient Active Problem List   Diagnosis Date Noted  . 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/08/2020    Phillips Climes, PTA 11/11/2020, 10:33 AM  [facility] [place], [place], [address] Phone: [phone]   Fax:  [phone]  Name: JOHNNIE GOYNES MRN: [id number] Date of Birth: December 02, 1955

## 2020-11-15 ENCOUNTER — Ambulatory Visit: Payer: Medicare Other | Admitting: Physical Therapy

## 2020-11-15 ENCOUNTER — Other Ambulatory Visit: Payer: Self-pay

## 2020-11-15 DIAGNOSIS — M6281 Muscle weakness (generalized): Secondary | ICD-10-CM

## 2020-11-15 DIAGNOSIS — M25511 Pain in right shoulder: Secondary | ICD-10-CM

## 2020-11-15 DIAGNOSIS — R600 Localized edema: Secondary | ICD-10-CM

## 2020-11-15 DIAGNOSIS — M25611 Stiffness of right shoulder, not elsewhere classified: Secondary | ICD-10-CM

## 2020-11-15 NOTE — Therapy (Signed)
[facility] [place], [place], [address] Phone: [phone]   Fax:  [phone]  Physical Therapy Treatment  Patient Details  Name: Stephen Diaz MRN: [id number] Date of Birth: December 16, 1955 Referring Provider (PT): Ophelia Charter, MD   Encounter Date: 11/15/2020   PT End of Session - 11/15/20 0949    Visit Number 17    Number of Visits 24    Date for PT Re-Evaluation 12/15/20    Authorization Type MCR/MCD; FOTO 10th visit score 53 and Progress note every 10th visit; Kx after 15    PT Start Time 0945    PT Stop Time 1028    PT Time Calculation (min) 43 min    Activity Tolerance Patient tolerated treatment well    Behavior During Therapy [facility] for tasks assessed/performed           Past Medical History:  Diagnosis Date  . Arthritis    right shoulder  . COPD (chronic obstructive pulmonary disease) ([place])   . Diabetes mellitus without complication ([place])   . Hypertension   . Right foot drop    from pinched nerve in back, wears brace    Past Surgical History:  Procedure Laterality Date  . CATARACT EXTRACTION Left   . CATARACT EXTRACTION W/PHACO Right 08/13/2020   Procedure: CATARACT EXTRACTION PHACO AND INTRAOCULAR LENS PLACEMENT (IOC);  Surgeon: Baruch Goldmann, MD;  Location: [facility];  Service: Ophthalmology;  Laterality: Right;  CDE: 8.24  . HERNIA REPAIR    . REVERSE SHOULDER ARTHROPLASTY Right 09/08/2020   Procedure: REVERSE TOTAL SHOULDER ARTHROPLASTY;  Surgeon: Hiram Gash, MD;  Location: [facility];  Service: Orthopedics;  Laterality: Right;  . screws and steel plate in neck  (J8-2)     10-12 years ago  x2    There were no vitals filed for this visit.   Subjective Assessment - 11/15/20 0947    Subjective COVID-19 screening performed upon arrival. Patient reported doing well with no new complaints    Patient Stated Goals get it back to normal    Currently in Pain? Yes    Pain Score 4     Pain  Location Shoulder    Pain Orientation Right    Pain Descriptors / Indicators Discomfort    Pain Type Surgical pain    Pain Onset More than a month ago    Pain Frequency Intermittent    Aggravating Factors  certain movements or prolong movement of shoulder    Pain Relieving Factors at rest                             [employer] [facility] [employer] Adult PT Treatment/Exercise - 11/15/20 0001      Shoulder Exercises: Supine   Protraction AROM;Right;20 reps;10 reps    Flexion AROM;Right   3x10     Shoulder Exercises: Sidelying   External Rotation AROM;Strengthening;Right    External Rotation Weight (lbs) 0    External Rotation Limitations 3x10      Shoulder Exercises: Standing   Flexion Strengthening;Right;20 reps;Theraband    Theraband Level (Shoulder Flexion) Level 2 (Red)    Flexion Limitations concentric only    Theraband Level (Shoulder ABduction) Level 2 (Red)    ABduction Limitations concentric only    Other Standing Exercises AROM for flexion and scaption no weight 3x10 each      Shoulder Exercises: Pulleys   Flexion 5 minutes    Other Pulley Exercises wall  ladder x14min      Shoulder Exercises: Therapy Ball   Other Therapy Ball Exercises red swiss ball wall walk up and down 3x10      Shoulder Exercises: Isometric Strengthening   External Rotation Other (comment)   10sec x10reps     Vasopneumatic   Number Minutes Vasopneumatic  10 minutes    Vasopnuematic Location  Shoulder    Vasopneumatic Pressure Low    Vasopneumatic Temperature  34                       PT Long Term Goals - 11/15/20 0952      PT LONG TERM GOAL #1   Title Ind with advanced HEP for ROM and strengthening to improve function.    Time 12    Period Weeks    Status On-going      PT LONG TERM GOAL #2   Title Improved right shoulder ROM to [facility] to perform ADLS within restrictions.    Baseline AROM 125 degrees and limited with weight retrictions 11/15/20    Time 12    Period Weeks     Status On-going      PT LONG TERM GOAL #3   Title Pt able to perform ADLS with functional R shoulder strength.    Baseline strength NT 11/15/20    Time 12    Period Weeks    Status On-going      PT LONG TERM GOAL #4   Title Decreased pain in right shoulder by >= 80% with ADLS.    Baseline Patient has some ongoing limitations due to weight restrictions 11/15/20    Time 12    Period Weeks    Status On-going      PT LONG TERM GOAL #5   Title Improved FOTO to 63    Baseline 10th visit score 53 10/21/20    Time 12    Period Weeks    Status On-going                 Plan - 11/15/20 1010    Clinical Impression Statement Patient tolerated treatment well today. Patient able to progress with reps today with active movements, some fatigue with exercises. Patient continues to have full PROM with active limitations. Patient doing well with light ADL's and knows limiations per protocol. current goals ongoing due to healing/protocol limitations.    Examination-Activity Limitations Hygiene/Grooming    Examination-Participation Restrictions Cleaning;Yard Work    Stability/Clinical Decision Making Stable/Uncomplicated    Rehab Potential Excellent    PT Frequency 2x / week    PT Duration 12 weeks    PT Treatment/Interventions ADLs/Self Care Home Management;Cryotherapy;Electrical Stimulation;Therapeutic exercise;Neuromuscular re-education;Patient/family education;Manual techniques;Dry needling;Passive range of motion;Vasopneumatic Device    PT Next Visit Plan PROM/AAROM/AROM; light passive stretching at end range; begin resisted bands ER/FF/ABD concentric only;  NO IR OR EXT x 12 wks (10 weeks 11/17/20) MD F/U 12/21/20    Consulted and Agree with Plan of Care Patient           Patient will benefit from skilled therapeutic intervention in order to improve the following deficits and impairments:  Decreased range of motion,Impaired UE functional use,Pain,Decreased strength,Increased edema,Postural  dysfunction  Visit Diagnosis: Acute pain of right shoulder  Muscle weakness (generalized)  Localized edema  Stiffness of right shoulder, not elsewhere classified     Problem List Patient Active Problem List   Diagnosis Date Noted  . Status post reverse total arthroplasty of right shoulder  09/08/2020    Quron Ruddy P, PTA 11/15/2020, 10:29 AM  [facility] [place], [place], [address] Phone: [phone]   Fax:  [phone]  Name: Stephen Diaz MRN: [id number] Date of Birth: 1956-05-07

## 2020-11-18 ENCOUNTER — Ambulatory Visit: Payer: Medicare Other | Admitting: Physical Therapy

## 2020-11-18 ENCOUNTER — Other Ambulatory Visit: Payer: Self-pay

## 2020-11-18 DIAGNOSIS — M25511 Pain in right shoulder: Secondary | ICD-10-CM | POA: Diagnosis not present

## 2020-11-18 DIAGNOSIS — R600 Localized edema: Secondary | ICD-10-CM

## 2020-11-18 DIAGNOSIS — M25611 Stiffness of right shoulder, not elsewhere classified: Secondary | ICD-10-CM

## 2020-11-18 DIAGNOSIS — M6281 Muscle weakness (generalized): Secondary | ICD-10-CM

## 2020-11-18 NOTE — Therapy (Signed)
[facility] [place], [place], [address] Phone: [phone]   Fax:  [phone]  Physical Therapy Treatment  Patient Details  Name: Stephen Diaz MRN: [id number] Date of Birth: 10-31-1955 Referring Provider (PT): Ophelia Charter, MD   Encounter Date: 11/18/2020   PT End of Session - 11/18/20 1019     Visit Number 18    Number of Visits 24    Date for PT Re-Evaluation 12/15/20    Authorization Type MCR/MCD; FOTO 10th visit score 12 and Progress note every 10th visit; Kx after 15    PT Start Time 0945    PT Stop Time 1028    PT Time Calculation (min) 43 min    Activity Tolerance Patient tolerated treatment well    Behavior During Therapy [facility] for tasks assessed/performed             Past Medical History:  Diagnosis Date   Arthritis    right shoulder   COPD (chronic obstructive pulmonary disease) ([place])    Diabetes mellitus without complication ([place])    Hypertension    Right foot drop    from pinched nerve in back, wears brace    Past Surgical History:  Procedure Laterality Date   CATARACT EXTRACTION Left    CATARACT EXTRACTION W/PHACO Right 08/13/2020   Procedure: CATARACT EXTRACTION PHACO AND INTRAOCULAR LENS PLACEMENT ([place]);  Surgeon: Baruch Goldmann, MD;  Location: [facility];  Service: Ophthalmology;  Laterality: Right;  CDE: 8.24   HERNIA REPAIR     REVERSE SHOULDER ARTHROPLASTY Right 09/08/2020   Procedure: REVERSE TOTAL SHOULDER ARTHROPLASTY;  Surgeon: Hiram Gash, MD;  Location: [facility];  Service: Orthopedics;  Laterality: Right;   screws and steel plate in neck  (L8-7)     10-12 years ago  x2    There were no vitals filed for this visit.   Subjective Assessment - 11/18/20 0950     Subjective COVID-19 screening performed upon arrival. Patient reported ok just some ongoing soreness    Patient Stated Goals get it back to normal    Currently in Pain? Yes    Pain Score 4     Pain Location  Shoulder    Pain Orientation Right    Pain Descriptors / Indicators Discomfort    Pain Type Surgical pain    Pain Onset More than a month ago    Pain Frequency Intermittent    Aggravating Factors  prolong activity    Pain Relieving Factors rest                               [facility] Adult PT Treatment/Exercise - 11/18/20 0001       Shoulder Exercises: Sidelying   External Rotation AROM;Strengthening;Right    External Rotation Weight (lbs) 0    External Rotation Limitations 3x10      Shoulder Exercises: Standing   Flexion Strengthening;Right;20 reps;Theraband    Theraband Level (Shoulder Flexion) Level 2 (Red)    Flexion Limitations concentric only    Theraband Level (Shoulder ABduction) Level 2 (Red)    ABduction Limitations concentric only    Other Standing Exercises AROM for flexion and scaption no weight 3x10 each      Shoulder Exercises: Pulleys   Flexion 5 minutes    Other Pulley Exercises wall ladder x59min      Shoulder Exercises: Therapy Ball   Other Therapy Ball Exercises red swiss ball wall  walk up and down 3x10      Vasopneumatic   Number Minutes Vasopneumatic  10 minutes    Vasopnuematic Location  Shoulder    Vasopneumatic Pressure Low    Vasopneumatic Temperature  34                         PT Long Term Goals - 11/15/20 0952       PT LONG TERM GOAL #1   Title Ind with advanced HEP for ROM and strengthening to improve function.    Time 12    Period Weeks    Status On-going      PT LONG TERM GOAL #2   Title Improved right shoulder ROM to [facility] to perform ADLS within restrictions.    Baseline AROM 125 degrees and limited with weight retrictions 11/15/20    Time 12    Period Weeks    Status On-going      PT LONG TERM GOAL #3   Title Pt able to perform ADLS with functional R shoulder strength.    Baseline strength NT 11/15/20    Time 12    Period Weeks    Status On-going      PT LONG TERM GOAL #4   Title Decreased pain  in right shoulder by >= 80% with ADLS.    Baseline Patient has some ongoing limitations due to weight restrictions 11/15/20    Time 12    Period Weeks    Status On-going      PT LONG TERM GOAL #5   Title Improved FOTO to 63    Baseline 10th visit score 53 10/21/20    Time 12    Period Weeks    Status On-going                   Plan - 11/18/20 1014     Clinical Impression Statement Patient tolerated treatment well today. Patient continues to progress per current protocol. Patient ROM continues to be consitstant with PROM and limitations due to strength. Patient current goals ongoing to to proocil limitations.    Examination-Activity Limitations Hygiene/Grooming    Examination-Participation Restrictions Cleaning;Yard Work    Stability/Clinical Decision Making Stable/Uncomplicated    Rehab Potential Excellent    PT Frequency 2x / week    PT Duration 12 weeks    PT Treatment/Interventions ADLs/Self Care Home Management;Cryotherapy;Electrical Stimulation;Therapeutic exercise;Neuromuscular re-education;Patient/family education;Manual techniques;Dry needling;Passive range of motion;Vasopneumatic Device    PT Next Visit Plan PROM/AAROM/AROM; light passive stretching at end range; begin resisted bands ER/FF/ABD concentric only;  NO IR OR EXT x 12 wks (10 weeks 11/17/20) MD F/U 12/21/20    Consulted and Agree with Plan of Care Patient             Patient will benefit from skilled therapeutic intervention in order to improve the following deficits and impairments:  Decreased range of motion, Impaired UE functional use, Pain, Decreased strength, Increased edema, Postural dysfunction  Visit Diagnosis: Acute pain of right shoulder  Muscle weakness (generalized)  Localized edema  Stiffness of right shoulder, not elsewhere classified     Problem List Patient Active Problem List   Diagnosis Date Noted   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/08/2020     Shamar Engelmann P, PTA 11/18/2020, 10:34 AM  [facility] [address] [place], [place], [address] Phone: [phone]   Fax:  [phone]  Name: Stephen Diaz MRN: [id number] Date  of Birth: [birth year]

## 2020-11-22 ENCOUNTER — Ambulatory Visit: Payer: Medicare Other | Admitting: Physical Therapy

## 2020-11-22 ENCOUNTER — Other Ambulatory Visit: Payer: Self-pay

## 2020-11-22 DIAGNOSIS — R600 Localized edema: Secondary | ICD-10-CM

## 2020-11-22 DIAGNOSIS — M25511 Pain in right shoulder: Secondary | ICD-10-CM

## 2020-11-22 DIAGNOSIS — M6281 Muscle weakness (generalized): Secondary | ICD-10-CM

## 2020-11-22 DIAGNOSIS — M25611 Stiffness of right shoulder, not elsewhere classified: Secondary | ICD-10-CM

## 2020-11-22 NOTE — Therapy (Signed)
[facility] [place], [place], [address] Phone: [phone]   Fax:  [phone]  Physical Therapy Treatment  Patient Details  Name: Stephen Diaz MRN: [id number] Date of Birth: 1956/01/28 Referring Provider (PT): Ophelia Charter, MD   Encounter Date: 11/22/2020   PT End of Session - 11/22/20 1019     Visit Number 19    Number of Visits 24    Date for PT Re-Evaluation 12/15/20    Authorization Type MCR/MCD; FOTO 10th visit score 51 and Progress note every 10th visit; Kx after 15    PT Start Time 0945    PT Stop Time 1023    PT Time Calculation (min) 38 min    Activity Tolerance Patient tolerated treatment well    Behavior During Therapy WFL for tasks assessed/performed             Past Medical History:  Diagnosis Date   Arthritis    right shoulder   COPD (chronic obstructive pulmonary disease) ([place])    Diabetes mellitus without complication ([place])    Hypertension    Right foot drop    from pinched nerve in back, wears brace    Past Surgical History:  Procedure Laterality Date   CATARACT EXTRACTION Left    CATARACT EXTRACTION W/PHACO Right 08/13/2020   Procedure: CATARACT EXTRACTION PHACO AND INTRAOCULAR LENS PLACEMENT ([place]);  Surgeon: Baruch Goldmann, MD;  Location: [facility];  Service: Ophthalmology;  Laterality: Right;  CDE: 8.24   HERNIA REPAIR     REVERSE SHOULDER ARTHROPLASTY Right 09/08/2020   Procedure: REVERSE TOTAL SHOULDER ARTHROPLASTY;  Surgeon: Hiram Gash, MD;  Location: [facility];  Service: Orthopedics;  Laterality: Right;   screws and steel plate in neck  (A1-2)     10-12 years ago  x2    There were no vitals filed for this visit.   Subjective Assessment - 11/22/20 0950     Subjective COVID-19 screening performed upon arrival. Patient doing well with some shoulder soreness    Patient Stated Goals get it back to normal    Currently in Pain? Yes    Pain Score 4     Pain Location  Shoulder    Pain Orientation Right    Pain Descriptors / Indicators Sore    Pain Type Surgical pain    Pain Onset More than a month ago    Pain Frequency Intermittent    Aggravating Factors  prolong activity    Pain Relieving Factors rest                [facility] PT Assessment - 11/22/20 0001       AROM   AROM Assessment Site Shoulder    Right/Left Shoulder Right    Right Shoulder Flexion 134 Degrees                           [facility] Adult PT Treatment/Exercise - 11/22/20 0001       Shoulder Exercises: Supine   Diagonals Strengthening;Right;20 reps    Other Supine Exercises serratus punch 1# 2x10      Shoulder Exercises: Sidelying   External Rotation Strengthening;Right;Weights    External Rotation Weight (lbs) 0    External Rotation Limitations 2x10      Shoulder Exercises: Standing   Flexion Strengthening;Right;20 reps;Theraband    Theraband Level (Shoulder Flexion) Level 2 (Red)    Flexion Limitations concentric only    Theraband Level (Shoulder  ABduction) Level 2 (Red)    ABduction Limitations concentric only    Other Standing Exercises 1# for flexion and scaption  2x10 each      Shoulder Exercises: Pulleys   Flexion 5 minutes    Other Pulley Exercises wall ladder x65min      Shoulder Exercises: ROM/Strengthening   Wall Pushups Other (comment)   2x10     Vasopneumatic   Number Minutes Vasopneumatic  10 minutes    Vasopnuematic Location  Shoulder    Vasopneumatic Pressure Low    Vasopneumatic Temperature  34                         PT Long Term Goals - 11/22/20 1017       PT LONG TERM GOAL #1   Title Ind with advanced HEP for ROM and strengthening to improve function.    Time 12    Period Weeks    Status On-going      PT LONG TERM GOAL #2   Title Improved right shoulder ROM to [facility] to perform ADLS within restrictions.    Baseline AROM 134 degrees and limited with weight retrictions 11/22/20    Time 12    Period Weeks     Status On-going      PT LONG TERM GOAL #3   Title Pt able to perform ADLS with functional R shoulder strength.    Baseline strength NT 11/22/20    Time 12    Period Weeks    Status On-going      PT LONG TERM GOAL #4   Title Decreased pain in right shoulder by >= 80% with ADLS.    Baseline Patient has some ongoing limitations due to weight restrictions 11/22/20    Time 12    Period Weeks    Status On-going      PT LONG TERM GOAL #5   Title Improved FOTO to 63    Baseline 10th visit score 53 10/21/20    Time 12    Period Weeks    Status On-going                   Plan - 11/22/20 1007     Clinical Impression Statement Patient tolerated treatment well today. Patient progressing with ther ex today and able to perform movements with greater ease. ER continues limited with weakness. Patient PROM is WNL with active limited due to weakness. Patient goals progressing this week.    Examination-Activity Limitations Hygiene/Grooming    Examination-Participation Restrictions Cleaning;Yard Work    Stability/Clinical Decision Making Stable/Uncomplicated    Rehab Potential Excellent    PT Frequency 2x / week    PT Duration 12 weeks    PT Treatment/Interventions ADLs/Self Care Home Management;Cryotherapy;Electrical Stimulation;Therapeutic exercise;Neuromuscular re-education;Patient/family education;Manual techniques;Dry needling;Passive range of motion;Vasopneumatic Device    PT Next Visit Plan PROM/AAROM/AROM; light passive stretching at end range; begin resisted bands ER/FF/ABD concentric only;  NO IR OR EXT x 12 wks (11 weeks 11/24/20) MD F/U 12/21/20    Consulted and Agree with Plan of Care Patient             Patient will benefit from skilled therapeutic intervention in order to improve the following deficits and impairments:  Decreased range of motion, Impaired UE functional use, Pain, Decreased strength, Increased edema, Postural dysfunction  Visit Diagnosis: Acute pain of  right shoulder  Muscle weakness (generalized)  Localized edema  Stiffness of right shoulder, not elsewhere classified  Problem List Patient Active Problem List   Diagnosis Date Noted   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/08/2020    Phillips Climes, PTA 11/22/2020, 10:30 AM  [facility] [place], [place], [address] Phone: [phone]   Fax:  [phone]  Name: Stephen Diaz MRN: [id number] Date of Birth: 09-01-1955

## 2020-11-25 ENCOUNTER — Ambulatory Visit: Payer: Medicare Other | Admitting: Physical Therapy

## 2020-11-25 ENCOUNTER — Other Ambulatory Visit: Payer: Self-pay

## 2020-11-25 DIAGNOSIS — R600 Localized edema: Secondary | ICD-10-CM

## 2020-11-25 DIAGNOSIS — M6281 Muscle weakness (generalized): Secondary | ICD-10-CM

## 2020-11-25 DIAGNOSIS — M25611 Stiffness of right shoulder, not elsewhere classified: Secondary | ICD-10-CM

## 2020-11-25 DIAGNOSIS — M25511 Pain in right shoulder: Secondary | ICD-10-CM | POA: Diagnosis not present

## 2020-11-25 NOTE — Therapy (Signed)
[facility] [place], [place], [address] Phone: [phone]   Fax:  [phone]  Physical Therapy Treatment  Patient Details  Name: Stephen Diaz MRN: [id number] Date of Birth: Nov 18, 1955 Referring Provider (PT): Ophelia Charter, MD   Encounter Date: 11/25/2020   PT End of Session - 11/25/20 1025     Visit Number 20    Number of Visits 24    Authorization Type MCR/MCD; FOTO 20th visit score 6 and Progress note every 10th visit; Kx after 15    PT Start Time 0945    PT Stop Time 1028    PT Time Calculation (min) 43 min    Activity Tolerance Patient tolerated treatment well    Behavior During Therapy WFL for tasks assessed/performed             Past Medical History:  Diagnosis Date   Arthritis    right shoulder   COPD (chronic obstructive pulmonary disease) ([place])    Diabetes mellitus without complication ([place])    Hypertension    Right foot drop    from pinched nerve in back, wears brace    Past Surgical History:  Procedure Laterality Date   CATARACT EXTRACTION Left    CATARACT EXTRACTION W/PHACO Right 08/13/2020   Procedure: CATARACT EXTRACTION PHACO AND INTRAOCULAR LENS PLACEMENT ([place]);  Surgeon: Baruch Goldmann, MD;  Location: [facility];  Service: Ophthalmology;  Laterality: Right;  CDE: 8.24   HERNIA REPAIR     REVERSE SHOULDER ARTHROPLASTY Right 09/08/2020   Procedure: REVERSE TOTAL SHOULDER ARTHROPLASTY;  Surgeon: Hiram Gash, MD;  Location: [place];  Service: Orthopedics;  Laterality: Right;   screws and steel plate in neck  (W9-6)     10-12 years ago  x2    There were no vitals filed for this visit.   Subjective Assessment - 11/25/20 0956     Subjective COVID-19 screening performed upon arrival. Patient doing well with some shoulder soreness yet overall improved    Patient Stated Goals get it back to normal    Currently in Pain? Yes    Pain Score 4     Pain Location Shoulder    Pain  Orientation Right    Pain Descriptors / Indicators Sore    Pain Type Surgical pain    Pain Onset More than a month ago    Pain Frequency Intermittent    Aggravating Factors  prolong activity    Pain Relieving Factors at rest                               [facility] Adult PT Treatment/Exercise - 11/25/20 0001       Shoulder Exercises: Supine   Diagonals Strengthening;Right;20 reps   D2 with 1#   Other Supine Exercises serratus punch 1# 2x10      Shoulder Exercises: Sidelying   External Rotation Strengthening;Right;Weights    External Rotation Weight (lbs) 0    External Rotation Limitations 3x10      Shoulder Exercises: Standing   Flexion Strengthening;Right;20 reps;Theraband    Theraband Level (Shoulder Flexion) Level 2 (Red)    Flexion Limitations concentric only    Theraband Level (Shoulder ABduction) Level 2 (Red)    ABduction Limitations concentric only    Other Standing Exercises 1# for flexion and scaption  2x10 each    Other Standing Exercises ball on wall for circles 2x20      Shoulder Exercises:  Pulleys   Flexion 5 minutes    Other Pulley Exercises wall ladder x64min      Shoulder Exercises: ROM/Strengthening   Wall Pushups 20 reps      Vasopneumatic   Number Minutes Vasopneumatic  10 minutes    Vasopnuematic Location  Shoulder    Vasopneumatic Pressure Low    Vasopneumatic Temperature  34                         PT Long Term Goals - 11/22/20 1017       PT LONG TERM GOAL #1   Title Ind with advanced HEP for ROM and strengthening to improve function.    Time 12    Period Weeks    Status On-going      PT LONG TERM GOAL #2   Title Improved right shoulder ROM to [facility] to perform ADLS within restrictions.    Baseline AROM 134 degrees and limited with weight retrictions 11/22/20    Time 12    Period Weeks    Status On-going      PT LONG TERM GOAL #3   Title Pt able to perform ADLS with functional R shoulder strength.     Baseline strength NT 11/22/20    Time 12    Period Weeks    Status On-going      PT LONG TERM GOAL #4   Title Decreased pain in right shoulder by >= 80% with ADLS.    Baseline Patient has some ongoing limitations due to weight restrictions 11/22/20    Time 12    Period Weeks    Status On-going      PT LONG TERM GOAL #5   Title Improved FOTO to 63    Baseline 10th visit score 53 10/21/20    Time 12    Period Weeks    Status On-going                   Plan - 11/25/20 0959     Clinical Impression Statement Patient tolerated treatment well today. patient continues to respond well to treatments within protocol limitaions. Patient has reported some ongoing soreness in shoulder yet no increased pain. patient continues to be able to perform light ADL's with greater ease. Patient PROM WNL. goals progressing this week.    Examination-Activity Limitations Hygiene/Grooming    Examination-Participation Restrictions Cleaning;Yard Work    Stability/Clinical Decision Making Stable/Uncomplicated    Rehab Potential Excellent    PT Frequency 2x / week    PT Duration 12 weeks    PT Treatment/Interventions ADLs/Self Care Home Management;Cryotherapy;Electrical Stimulation;Therapeutic exercise;Neuromuscular re-education;Patient/family education;Manual techniques;Dry needling;Passive range of motion;Vasopneumatic Device    PT Next Visit Plan PROM/AAROM/AROM; light passive stretching at end range; begin resisted bands ER/FF/ABD concentric only;  NO IR OR EXT x 12 wks (11 weeks 11/24/20) MD F/U 12/21/20    Consulted and Agree with Plan of Care Patient             Patient will benefit from skilled therapeutic intervention in order to improve the following deficits and impairments:  Decreased range of motion, Impaired UE functional use, Pain, Decreased strength, Increased edema, Postural dysfunction  Visit Diagnosis: Acute pain of right shoulder  Muscle weakness (generalized)  Localized  edema  Stiffness of right shoulder, not elsewhere classified     Problem List Patient Active Problem List   Diagnosis Date Noted   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/08/2020  Ladean Raya, PTA 11/25/20 10:35 AM   [facility] [place], [place], [address] Phone: [phone]   Fax:  [phone]  Name: Stephen Diaz MRN: [id number] Date of Birth: 03/21/1956  Progress Note Reporting Period 09/22/20 to 11/25/20  See note below for Objective Data and Assessment of Progress/Goals.  Progressing per protocol.    Mali Applegate MPT

## 2020-11-29 ENCOUNTER — Other Ambulatory Visit: Payer: Self-pay

## 2020-11-29 ENCOUNTER — Ambulatory Visit: Payer: Medicare Other | Admitting: Physical Therapy

## 2020-11-29 DIAGNOSIS — M6281 Muscle weakness (generalized): Secondary | ICD-10-CM

## 2020-11-29 DIAGNOSIS — M25611 Stiffness of right shoulder, not elsewhere classified: Secondary | ICD-10-CM

## 2020-11-29 DIAGNOSIS — M25511 Pain in right shoulder: Secondary | ICD-10-CM | POA: Diagnosis not present

## 2020-11-29 DIAGNOSIS — R600 Localized edema: Secondary | ICD-10-CM

## 2020-11-29 NOTE — Therapy (Signed)
[facility] [place], [place], [address] Phone: [phone]   Fax:  [phone]  Physical Therapy Treatment  Patient Details  Name: Stephen Diaz MRN: [id number] Date of Birth: 11/26/1955 Referring Provider (PT): Ophelia Charter, MD   Encounter Date: 11/29/2020   PT End of Session - 11/29/20 1007     Visit Number 21    Number of Visits 24    Date for PT Re-Evaluation 12/15/20    Authorization Type MCR/MCD; FOTO 20th visit score 60 and Progress note every 10th visit; Kx after 15    PT Start Time 0945    PT Stop Time 1027    PT Time Calculation (min) 42 min    Activity Tolerance Patient tolerated treatment well    Behavior During Therapy WFL for tasks assessed/performed             Past Medical History:  Diagnosis Date   Arthritis    right shoulder   COPD (chronic obstructive pulmonary disease) ([place])    Diabetes mellitus without complication ([place])    Hypertension    Right foot drop    from pinched nerve in back, wears brace    Past Surgical History:  Procedure Laterality Date   CATARACT EXTRACTION Left    CATARACT EXTRACTION W/PHACO Right 08/13/2020   Procedure: CATARACT EXTRACTION PHACO AND INTRAOCULAR LENS PLACEMENT ([place]);  Surgeon: Baruch Goldmann, MD;  Location[facility];  Service: Ophthalmology;  Laterality: Right;  CDE: 8.24   HERNIA REPAIR     REVERSE SHOULDER ARTHROPLASTY Right 09/08/2020   Procedure: REVERSE TOTAL SHOULDER ARTHROPLASTY;  Surgeon: Hiram Gash, MD;  Location: [place];  Service: Orthopedics;  Laterality: Right;   screws and steel plate in neck  (H7-0)     10-12 years ago  x2    There were no vitals filed for this visit.   Subjective Assessment - 11/29/20 0954     Subjective COVID-19 screening performed upon arrival. Patient arrived doing well.    Patient Stated Goals get it back to normal    Currently in Pain? Yes    Pain Score 3     Pain Location Shoulder    Pain  Orientation Right    Pain Descriptors / Indicators Sore    Pain Type Surgical pain    Pain Onset More than a month ago    Pain Frequency Intermittent    Aggravating Factors  prolong activty    Pain Relieving Factors at rest                [facility] PT Assessment - 11/29/20 0001       AROM   AROM Assessment Site Shoulder    Right/Left Shoulder Right    Right Shoulder Flexion 135 Degrees    Right Shoulder External Rotation 65 Degrees      PROM   PROM Assessment Site Shoulder    Right/Left Shoulder Right    Right Shoulder Flexion 145 Degrees    Right Shoulder External Rotation 70 Degrees                           [facility] Adult PT Treatment/Exercise - 11/29/20 0001       Shoulder Exercises: Supine   Diagonals Strengthening;Right;20 reps    Theraband Level (Shoulder Diagonals) --   D2 with 1#   Other Supine Exercises serratus punch 1# 2x10      Shoulder Exercises: Sidelying  External Rotation Strengthening;Right;Weights    External Rotation Weight (lbs) 0    External Rotation Limitations 3x10      Shoulder Exercises: Standing   Flexion Strengthening;Right;20 reps;Theraband    Theraband Level (Shoulder Flexion) Level 2 (Red)    Flexion Limitations concentric only    ABduction Strengthening;Right;20 reps;Theraband    Theraband Level (Shoulder ABduction) Level 2 (Red)    ABduction Limitations concentric only    Other Standing Exercises 1# for flexion and scaption  2x10 each    Other Standing Exercises ball on wall for circles 2x20      Shoulder Exercises: Pulleys   Flexion 5 minutes    Other Pulley Exercises wall ladder x51min      Shoulder Exercises: ROM/Strengthening   Wall Pushups 20 reps      Vasopneumatic   Number Minutes Vasopneumatic  10 minutes    Vasopnuematic Location  Shoulder    Vasopneumatic Pressure Low    Vasopneumatic Temperature  34      Manual Therapy   Manual Therapy Passive ROM    Passive ROM PROM of R shoulder into flex, ER  with gentle holds at end range to improve mobility                         PT Long Term Goals - 11/29/20 1008       PT LONG TERM GOAL #1   Title Ind with advanced HEP for ROM and strengthening to improve function.    Time 12    Period Weeks    Status On-going      PT LONG TERM GOAL #2   Title Improved right shoulder ROM to [facility] to perform ADLS within restrictions.    Baseline AROM 135 flexion/65 degrees ER and limited with weight retrictions 6/202    Time 12    Period Weeks    Status On-going      PT LONG TERM GOAL #3   Title Pt able to perform ADLS with functional R shoulder strength.    Baseline strength NT 11/29/20    Time 12    Period Weeks    Status On-going      PT LONG TERM GOAL #4   Title Decreased pain in right shoulder by >= 80% with ADLS.    Baseline Patient has some ongoing limitations due to weight restrictions 6/202    Time 12    Period Weeks    Status On-going      PT LONG TERM GOAL #5   Title Improved FOTO to 63    Baseline 20th visit score 63 11/25/20    Time 12    Period Weeks    Status Achieved                   Plan - 11/29/20 1019     Clinical Impression Statement Patient tolerated treatment well today. Patient has overall less pain and progresing with PRE's per protocol. Patient has some decreased ROM for ER and performed manual PROM to improve mobility. Patient doing well with ADL's per reported. Patient goals progressing today.    Examination-Activity Limitations Hygiene/Grooming    Examination-Participation Restrictions Cleaning;Yard Work    Stability/Clinical Decision Making Stable/Uncomplicated    Rehab Potential Excellent    PT Frequency 2x / week    PT Duration 12 weeks    PT Treatment/Interventions ADLs/Self Care Home Management;Cryotherapy;Electrical Stimulation;Therapeutic exercise;Neuromuscular re-education;Patient/family education;Manual techniques;Dry needling;Passive range of motion;Vasopneumatic Device     PT  Next Visit Plan PROM/AAROM/AROM; light passive stretching at end range; begin resisted bands ER/FF/ABD concentric only;  NO IR OR EXT x 12 wks (11 weeks 11/24/20) MD F/U 12/21/20    Consulted and Agree with Plan of Care Patient             Patient will benefit from skilled therapeutic intervention in order to improve the following deficits and impairments:  Decreased range of motion, Impaired UE functional use, Pain, Decreased strength, Increased edema, Postural dysfunction  Visit Diagnosis: Acute pain of right shoulder  Muscle weakness (generalized)  Localized edema  Stiffness of right shoulder, not elsewhere classified     Problem List Patient Active Problem List   Diagnosis Date Noted   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/08/2020    Jaelie Aguilera P, PTA 11/29/2020, 10:28 AM  [facility] [place], [place], [address] Phone: [phone]   Fax:  [phone]  Name: Stephen Diaz MRN: [id number] Date of Birth: January 03, 1956

## 2020-12-02 ENCOUNTER — Ambulatory Visit: Payer: Medicare Other | Admitting: Physical Therapy

## 2020-12-02 ENCOUNTER — Other Ambulatory Visit: Payer: Self-pay

## 2020-12-02 DIAGNOSIS — R600 Localized edema: Secondary | ICD-10-CM

## 2020-12-02 DIAGNOSIS — M6281 Muscle weakness (generalized): Secondary | ICD-10-CM

## 2020-12-02 DIAGNOSIS — M25511 Pain in right shoulder: Secondary | ICD-10-CM

## 2020-12-02 DIAGNOSIS — M25611 Stiffness of right shoulder, not elsewhere classified: Secondary | ICD-10-CM

## 2020-12-02 NOTE — Therapy (Signed)
[facility], [place], [address] Phone: [phone]   Fax:  [phone]  Physical Therapy Treatment  Patient Details  Name: Stephen Diaz MRN: [id number] Date of Birth: [birth year] Referring Provider (PT): Ophelia Charter, MD   Encounter Date: 12/02/2020   PT End of Session - 12/02/20 1034     Visit Number 22    Number of Visits 24    Date for PT Re-Evaluation 12/15/20    Authorization Type MCR/MCD; FOTO 20th visit score 72 and Progress note every 10th visit; Kx after 15    PT Start Time 0949    PT Stop Time 1039    PT Time Calculation (min) 50 min    Activity Tolerance Patient tolerated treatment well;Patient limited by pain    Behavior During Therapy [facility] for tasks assessed/performed             Past Medical History:  Diagnosis Date   Arthritis    right shoulder   COPD (chronic obstructive pulmonary disease) ([facility])    Diabetes mellitus without complication ([facility])    Hypertension    Right foot drop    from pinched nerve in back, wears brace    Past Surgical History:  Procedure Laterality Date   CATARACT EXTRACTION Left    CATARACT EXTRACTION W/PHACO Right 08/13/2020   Procedure: CATARACT EXTRACTION PHACO AND INTRAOCULAR LENS PLACEMENT ([place]);  Surgeon: Baruch Goldmann, MD;  [facility]: [facility];  Service: Ophthalmology;  Laterality: Right;  CDE: 8.24   HERNIA REPAIR     REVERSE SHOULDER ARTHROPLASTY Right 09/08/2020   Procedure: REVERSE TOTAL SHOULDER ARTHROPLASTY;  Surgeon: Hiram Gash, MD;  Location: [facility];  Service: Orthopedics;  Laterality: Right;   screws and steel plate in neck  (N4-6)     10-12 years ago  x2    There were no vitals filed for this visit.   Subjective Assessment - 12/02/20 0943     Subjective COVID-19 screening performed upon arrival. Patient reported a lot of pain today for unknown reason.    Patient Stated Goals get it back to normal    Currently in Pain? Yes     Pain Score 8     Pain Location Shoulder    Pain Orientation Right    Pain Descriptors / Indicators Sore    Pain Type Surgical pain    Pain Onset More than a month ago    Pain Frequency Intermittent    Aggravating Factors  prolong activity    Pain Relieving Factors at rest                               [facility] Adult PT Treatment/Exercise - 12/02/20 0001       Modalities   Modalities Electrical Stimulation;Vasopneumatic      Electrical Stimulation   Electrical Stimulation Location right post shoulder    Electrical Stimulation Action IFC    Electrical Stimulation Parameters 80-150hz  x18min    Electrical Stimulation Goals Pain;Tone      Vasopneumatic   Number Minutes Vasopneumatic  15 minutes    Vasopnuematic Location  Shoulder    Vasopneumatic Pressure Low    Vasopneumatic Temperature  34      Manual Therapy   Manual Therapy Soft tissue mobilization    Soft tissue mobilization manual STW to right UT/rhomboid area to decrease pain and tone  PT Long Term Goals - 11/29/20 1008       PT LONG TERM GOAL #1   Title Ind with advanced HEP for ROM and strengthening to improve function.    Time 12    Period Weeks    Status On-going      PT LONG TERM GOAL #2   Title Improved right shoulder ROM to [facility] to perform ADLS within restrictions.    Baseline AROM 135 flexion/65 degrees ER and limited with weight retrictions 6/202    Time 12    Period Weeks    Status On-going      PT LONG TERM GOAL #3   Title Pt able to perform ADLS with functional R shoulder strength.    Baseline strength NT 11/29/20    Time 12    Period Weeks    Status On-going      PT LONG TERM GOAL #4   Title Decreased pain in right shoulder by >= 80% with ADLS.    Baseline Patient has some ongoing limitations due to weight restrictions 6/202    Time 12    Period Weeks    Status On-going      PT LONG TERM GOAL #5   Title Improved FOTO to 63     Baseline 20th visit score 63 11/25/20    Time 12    Period Weeks    Status Achieved                   Plan - 12/02/20 1035     Clinical Impression Statement Patient arrived with some new pain for unknown reason. Today discussed conservative treatment to reduce pain with manual STW and modalities. Performed manual gentle to moderated pressure to right UT and rhomboid area to reduce pain and tone followed by ES with VASO. Normal response upon removal of modalites. Less pain reported post treatment. Goals ongoing    Examination-Activity Limitations Hygiene/Grooming    Examination-Participation Restrictions Cleaning;Yard Work    Stability/Clinical Decision Making Stable/Uncomplicated    Rehab Potential Excellent    PT Frequency 2x / week    PT Duration 12 weeks    PT Treatment/Interventions ADLs/Self Care Home Management;Cryotherapy;Electrical Stimulation;Therapeutic exercise;Neuromuscular re-education;Patient/family education;Manual techniques;Dry needling;Passive range of motion;Vasopneumatic Device    PT Next Visit Plan assess pain and cont per protocol (12 weeks 12/02/20) MD F/U 12/21/20    Consulted and Agree with Plan of Care Patient             Patient will benefit from skilled therapeutic intervention in order to improve the following deficits and impairments:  Decreased range of motion, Impaired UE functional use, Pain, Decreased strength, Increased edema, Postural dysfunction  Visit Diagnosis: Acute pain of right shoulder  Muscle weakness (generalized)  Localized edema  Stiffness of right shoulder, not elsewhere classified     Problem List Patient Active Problem List   Diagnosis Date Noted   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/08/2020    Colan Laymon P, PTA 12/02/2020, 10:45 AM  [facility] [address] [place], [place], [address] Phone: [phone]   Fax:  [phone]  Name:  Stephen Diaz MRN: [id number] Date of Birth: 1956-05-13

## 2020-12-06 ENCOUNTER — Ambulatory Visit: Payer: Medicare Other | Admitting: *Deleted

## 2020-12-06 ENCOUNTER — Other Ambulatory Visit: Payer: Self-pay

## 2020-12-06 DIAGNOSIS — M25511 Pain in right shoulder: Secondary | ICD-10-CM

## 2020-12-06 DIAGNOSIS — M25611 Stiffness of right shoulder, not elsewhere classified: Secondary | ICD-10-CM

## 2020-12-06 DIAGNOSIS — R600 Localized edema: Secondary | ICD-10-CM

## 2020-12-06 DIAGNOSIS — M6281 Muscle weakness (generalized): Secondary | ICD-10-CM

## 2020-12-06 NOTE — Therapy (Signed)
[facility] [place], [place], [address] Phone: [phone]   Fax:  [phone]  Physical Therapy Treatment  Patient Details  Name: Stephen Diaz MRN: [id number] Date of Birth: [birth year] Referring Provider (PT): Ophelia Charter, MD   Encounter Date: 12/06/2020   PT End of Session - 12/06/20 0944     Visit Number 23    Number of Visits 24    Date for PT Re-Evaluation 12/15/20    Authorization Type MCR/MCD; FOTO 20th visit score 44 and Progress note every 10th visit; Kx after 15    Progress Note Due on Visit 10    PT Start Time 0945    PT Stop Time 1032    PT Time Calculation (min) 47 min             Past Medical History:  Diagnosis Date   Arthritis    right shoulder   COPD (chronic obstructive pulmonary disease) ([place])    Diabetes mellitus without complication ([place])    Hypertension    Right foot drop    from pinched nerve in back, wears brace    Past Surgical History:  Procedure Laterality Date   CATARACT EXTRACTION Left    CATARACT EXTRACTION W/PHACO Right 08/13/2020   Procedure: CATARACT EXTRACTION PHACO AND INTRAOCULAR LENS PLACEMENT ([place]);  Surgeon: Baruch Goldmann, MD;  [facility]: [facility];  Service: Ophthalmology;  Laterality: Right;  CDE: 8.24   HERNIA REPAIR     REVERSE SHOULDER ARTHROPLASTY Right 09/08/2020   Procedure: REVERSE TOTAL SHOULDER ARTHROPLASTY;  Surgeon: Hiram Gash, MD;  Location: [place];  Service: Orthopedics;  Laterality: Right;   screws and steel plate in neck  (K0-2)     10-12 years ago  x2    There were no vitals filed for this visit.                      [facility] Adult PT Treatment/Exercise - 12/06/20 0001       Modalities   Modalities Electrical Stimulation;Vasopneumatic      Acupuncturist Location right post shoulder    Electrical Stimulation Action IFC    Electrical Stimulation Parameters 80-150hz  x 47mins     Electrical Stimulation Goals Pain      Vasopneumatic   Number Minutes Vasopneumatic  15 minutes    Vasopnuematic Location  Shoulder    Vasopneumatic Pressure Low    Vasopneumatic Temperature  34      Manual Therapy   Manual Therapy Soft tissue mobilization    Manual therapy comments AROM for elevation was 130 degrees, ER to 40 degrees with no increase in pain    Soft tissue mobilization manual STW to right UT/rhomboid area to decrease pain and tone. TPR to RT UT with good release                         PT Long Term Goals - 11/29/20 1008       PT LONG TERM GOAL #1   Title Ind with advanced HEP for ROM and strengthening to improve function.    Time 12    Period Weeks    Status On-going      PT LONG TERM GOAL #2   Title Improved right shoulder ROM to [facility] to perform ADLS within restrictions.    Baseline AROM 135 flexion/65 degrees ER and limited with weight retrictions 6/202    Time 12  Period Weeks    Status On-going      PT LONG TERM GOAL #3   Title Pt able to perform ADLS with functional R shoulder strength.    Baseline strength NT 11/29/20    Time 12    Period Weeks    Status On-going      PT LONG TERM GOAL #4   Title Decreased pain in right shoulder by >= 80% with ADLS.    Baseline Patient has some ongoing limitations due to weight restrictions 6/202    Time 12    Period Weeks    Status On-going      PT LONG TERM GOAL #5   Title Improved FOTO to 63    Baseline 20th visit score 63 11/25/20    Time 12    Period Weeks    Status Achieved                   Plan - 12/06/20 0945     Clinical Impression Statement Pt arrived today and reports that now he also has some tingling/numbness feeling in RT hand. Pt also reports that he has had cervical surgery in the past that also affected his RT UE. Consevative Rx today and tolerated with with good TPR. His RT shldr AROM fof flexion today was 130 degrees ans ER to 40 degrees with no increase in  pain with these movements. Pt to call MD office today for appointment    Examination-Activity Limitations Hygiene/Grooming    Stability/Clinical Decision Making Stable/Uncomplicated    Rehab Potential Excellent    PT Treatment/Interventions ADLs/Self Care Home Management;Cryotherapy;Electrical Stimulation;Therapeutic exercise;Neuromuscular re-education;Patient/family education;Manual techniques;Dry needling;Passive range of motion;Vasopneumatic Device    PT Next Visit Plan assess pain and cont per protocol (12 weeks 12/02/20) MD F/U 12/21/20    Consulted and Agree with Plan of Care Patient             Patient will benefit from skilled therapeutic intervention in order to improve the following deficits and impairments:  Decreased range of motion, Impaired UE functional use, Pain, Decreased strength, Increased edema, Postural dysfunction  Visit Diagnosis: Acute pain of right shoulder  Muscle weakness (generalized)  Localized edema  Stiffness of right shoulder, not elsewhere classified     Problem List Patient Active Problem List   Diagnosis Date Noted   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/08/2020    Rayonna Heldman,CHRIS, PTA 12/06/2020, 10:41 AM  [facility] [address] [place], [place], [address] Phone: [phone]   Fax:  [phone]  Name: NALU TROUBLEFIELD MRN: [id number] Date of Birth: [birth year]

## 2020-12-09 ENCOUNTER — Encounter: Payer: Medicare Other | Admitting: Physical Therapy

## 2020-12-10 HISTORY — PX: COLONOSCOPY: SHX174

## 2020-12-16 ENCOUNTER — Encounter: Payer: Medicare Other | Admitting: Physical Therapy

## 2020-12-20 ENCOUNTER — Ambulatory Visit: Payer: Medicare Other | Attending: Orthopaedic Surgery | Admitting: Physical Therapy

## 2020-12-20 ENCOUNTER — Other Ambulatory Visit: Payer: Self-pay

## 2020-12-20 DIAGNOSIS — R600 Localized edema: Secondary | ICD-10-CM | POA: Insufficient documentation

## 2020-12-20 DIAGNOSIS — M6281 Muscle weakness (generalized): Secondary | ICD-10-CM | POA: Insufficient documentation

## 2020-12-20 DIAGNOSIS — M25611 Stiffness of right shoulder, not elsewhere classified: Secondary | ICD-10-CM | POA: Insufficient documentation

## 2020-12-20 DIAGNOSIS — M25511 Pain in right shoulder: Secondary | ICD-10-CM | POA: Diagnosis present

## 2020-12-20 NOTE — Therapy (Signed)
[facility] [place], [place], [address] Phone: [phone]   Fax:  [phone]  Physical Therapy Treatment  Patient Details  Name: Stephen Diaz MRN: [id number] Date of Birth: 02-07-1956 Referring Provider (PT): Ophelia Charter, MD   Encounter Date: 12/20/2020   PT End of Session - 12/20/20 0950     Visit Number 24    Number of Visits 24    Date for PT Re-Evaluation 12/15/20    Authorization Type MCR/MCD; FOTO 24th visit score 53 and Progress note every 10th visit; Kx after 15    PT Start Time 0945    PT Stop Time 1020    PT Time Calculation (min) 35 min    Activity Tolerance Patient tolerated treatment well    Behavior During Therapy [facility] for tasks assessed/performed             Past Medical History:  Diagnosis Date   Arthritis    right shoulder   COPD (chronic obstructive pulmonary disease) ([place])    Diabetes mellitus without complication ([place])    Hypertension    Right foot drop    from pinched nerve in back, wears brace    Past Surgical History:  Procedure Laterality Date   CATARACT EXTRACTION Left    CATARACT EXTRACTION W/PHACO Right 08/13/2020   Procedure: CATARACT EXTRACTION PHACO AND INTRAOCULAR LENS PLACEMENT ([place]);  Surgeon: Baruch Goldmann, MD;  Location: [facility];  Service: Ophthalmology;  Laterality: Right;  CDE: 8.24   HERNIA REPAIR     REVERSE SHOULDER ARTHROPLASTY Right 09/08/2020   Procedure: REVERSE TOTAL SHOULDER ARTHROPLASTY;  Surgeon: Hiram Gash, MD;  Location: [place];  Service: Orthopedics;  Laterality: Right;   screws and steel plate in neck  (H2-0)     10-12 years ago  x2    There were no vitals filed for this visit.   Subjective Assessment - 12/20/20 0945     Subjective COVID-19 screening performed upon arrival. Patient reported some inflamation in shoulder and had a shot, MD released him from PT    Patient Stated Goals get it back to normal    Currently in Pain?  No/denies                [facility] PT Assessment - 12/20/20 0001       ROM / Strength   AROM / PROM / Strength AROM;Strength      AROM   AROM Assessment Site Shoulder    Right/Left Shoulder Right    Right Shoulder Flexion 134 Degrees    Right Shoulder External Rotation 67 Degrees      Strength   Strength Assessment Site Shoulder    Right/Left Shoulder Right    Right Shoulder Flexion 4+/5    Right Shoulder Internal Rotation 4+/5    Right Shoulder External Rotation 4-/5                           [facility] Adult PT Treatment/Exercise - 12/20/20 0001       Shoulder Exercises: Seated   Diagonals Strengthening;Right;20 reps;Theraband    Theraband Level (Shoulder Diagonals) Level 1 (Yellow)      Shoulder Exercises: Standing   Protraction Strengthening;Right;20 reps;10 reps;Theraband    Theraband Level (Shoulder Protraction) Level 2 (Red)    Horizontal ABduction Strengthening;Right;20 reps;10 reps;Theraband    Theraband Level (Shoulder Horizontal ABduction) Level 1 (Yellow)    External Rotation Strengthening;Right;5 reps;Theraband;Limitations    Theraband Level (  Shoulder External Rotation) Level 1 (Yellow)    External Rotation Limitations weakness    Internal Rotation Strengthening;Right;20 reps;10 reps;Theraband    Theraband Level (Shoulder Internal Rotation) Level 2 (Red)    Retraction Strengthening;Right;20 reps;10 reps;Theraband    Theraband Level (Shoulder Retraction) Level 2 (Red)      Shoulder Exercises: Pulleys   Flexion 5 minutes      Shoulder Exercises: ROM/Strengthening   Wall Pushups 20 reps      Vasopneumatic   Number Minutes Vasopneumatic  10 minutes    Vasopnuematic Location  Shoulder    Vasopneumatic Pressure Low    Vasopneumatic Temperature  34                    PT Education - 12/20/20 1013     Education Details HEP progression with t-bands    Person(s) Educated Patient    Methods Explanation;Demonstration;Handout     Comprehension Verbalized understanding;Returned demonstration                 PT Long Term Goals - 12/20/20 0948       PT LONG TERM GOAL #1   Title Ind with advanced HEP for ROM and strengthening to improve function.    Baseline /Issued today 12/20/20    Time 12    Period Weeks    Status Achieved      PT LONG TERM GOAL #2   Title Improved right shoulder ROM to [facility] to perform ADLS within restrictions.    Baseline WNL for ADL's 12/20/20    Time 12    Period Weeks    Status Achieved      PT LONG TERM GOAL #3   Title Pt able to perform ADLS with functional R shoulder strength.    Time 12    Period Weeks    Status Achieved      PT LONG TERM GOAL #4   Title Decreased pain in right shoulder by >= 80% with ADLS.    Baseline no pain reported with ADL's 12/20/20    Time 12    Period Weeks    Status Achieved      PT LONG TERM GOAL #5   Title Improved FOTO to 63    Baseline 20th visit score 63 11/25/20    Time 12    Period Weeks    Status Achieved                   Plan - 12/20/20 1022     Clinical Impression Statement Patient has met all current goals today. Patient had a flare up in left shoulder and MD gave him a shot for inflammation and released him from PT. Today no pain and patients AROM and strength WNL for his ADL's per reported. he has weakness in ER yet given HEP for home progression. Patient is limited with no lifting over 50# which is no problem for patients current day to day. DC today.    Examination-Activity Limitations Hygiene/Grooming    Examination-Participation Restrictions Cleaning;Yard Work    Stability/Clinical Decision Making Stable/Uncomplicated    Rehab Potential Excellent    PT Frequency 2x / week    PT Duration 12 weeks    PT Treatment/Interventions ADLs/Self Care Home Management;Cryotherapy;Electrical Stimulation;Therapeutic exercise;Neuromuscular re-education;Patient/family education;Manual techniques;Dry needling;Passive range of  motion;Vasopneumatic Device    PT Next Visit Plan DC    Consulted and Agree with Plan of Care Patient  Patient will benefit from skilled therapeutic intervention in order to improve the following deficits and impairments:  Decreased range of motion, Impaired UE functional use, Pain, Decreased strength, Increased edema, Postural dysfunction  Visit Diagnosis: Acute pain of right shoulder  Muscle weakness (generalized)  Localized edema  Stiffness of right shoulder, not elsewhere classified     Problem List Patient Active Problem List   Diagnosis Date Noted   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/08/2020    Ladean Raya, PTA 12/20/20 10:27 AM   [facility] [place], [place], [address] Phone: [phone]   Fax:  [phone]  Name: KIWAN GADSDEN MRN: [id number] Date of Birth: 01-21-1956   PHYSICAL THERAPY DISCHARGE SUMMARY Visits from Start of Care: 24.  Current functional level related to goals / functional outcomes: See above.   Remaining deficits: All goals met.   Education / Equipment: HEP.   Patient agrees to discharge. Patient goals were met. Patient is being discharged due to being pleased with the current functional level.    Mali Applegate MPT

## 2020-12-20 NOTE — Patient Instructions (Signed)
  Strengthening: Resisted Flexion   Hold tubing with right arm at side. Pull forward and up. Move shoulder through pain-free range of motion. Repeat __10__ times per set. Do _2-3___ sets per session. Do _2-3___ sessions per day.  Strengthening: Resisted Extension   Hold tubing in right hand, arm forward. Pull arm back, elbow straight. Repeat __10__ times per set. Do _2-3___ sets per session. Do _2-3___ sessions per day.   Strengthening: Resisted Internal Rotation   Hold tubing in right hand, elbow at side and forearm out. Rotate forearm in across body. Repeat __10__ times per set. Do _2-3___ sets per session. Do _2-3___ sessions per day.   Strengthening: Resisted External Rotation   Hold tubing in right hand, elbow at side and forearm across body. Rotate forearm out. Repeat __10__ times per set. Do __2-3__ sets per session. Do ____ sessions per day.   SIDE LYING EXTERNAL ROTATION - ER  Lie on your side with your elbow bent and rested on your side. Next, draw up the your arm from the ground towards the ceiling.   Place a rolled up towel under your elbow if advised by your clinician. 10-30 reps 1xday

## 2020-12-23 ENCOUNTER — Encounter: Payer: Medicare Other | Admitting: Physical Therapy

## 2020-12-30 DIAGNOSIS — K529 Noninfective gastroenteritis and colitis, unspecified: Secondary | ICD-10-CM | POA: Insufficient documentation

## 2021-01-06 ENCOUNTER — Encounter: Payer: Self-pay | Admitting: Internal Medicine

## 2021-03-22 NOTE — Progress Notes (Signed)
Synopsis: Referred for lung nodule by [name], PA-C  Subjective:   PATIENT ID: Stephen Diaz GENDER: male DOB: 1955-11-04, MRN: [id number]  Chief Complaint  Patient presents with   Consult    Pt being referred due to a lung nodule seen on CT. Denies any complaints of cough, SOB, or chest discomfort.   65yM with history of COPD on trelegy, smoking ~50 py referred for lung nodule  He doesn't have much of a cough. No fever. No recent pneumonia. No CP. No night sweats drenching sheets. No unintentional weight loss.   He has some DOE, trelegy has helped quite a bit. He has had possibly one exacerbation of COPD this year requiring prednisone, didn't require hospitalization. Has never been hospitalized for COPD. If he's taken his trelegy he could climb about 4 flights of stairs without having to stop due to dyspnea.   Otherwise pertinent review of systems is negative.  Aunt may have died from lung cancer. No other lung disease  He was a Multimedia programmer. Retired now, retired [employer]. No current exposures to dusts/solvents/particulates without a mask over these past 4 years. He has no pets. Has never lived outside of . Regarding TB risk factors has never been incarcerated,   Past Medical History:  Diagnosis Date   Arthritis    right shoulder   COPD (chronic obstructive pulmonary disease) ([facility])    Diabetes mellitus without complication ([facility])    Hypertension    Right foot drop    from pinched nerve in back, wears brace     No family history on file.   Past Surgical History:  Procedure Laterality Date   CATARACT EXTRACTION Left    CATARACT EXTRACTION W/PHACO Right 08/13/2020   Procedure: CATARACT EXTRACTION PHACO AND INTRAOCULAR LENS PLACEMENT ([facility]);  Surgeon: Baruch Goldmann, MD;  Location: [facility];  Service: Ophthalmology;  Laterality: Right;  CDE: 8.24   HERNIA REPAIR     REVERSE SHOULDER ARTHROPLASTY Right 09/08/2020   Procedure: REVERSE TOTAL SHOULDER ARTHROPLASTY;   Surgeon: Hiram Gash, MD;  Location: [place];  Service: Orthopedics;  Laterality: Right;   screws and steel plate in neck  (O1-3)     10-12 years ago  x2    Social History   Socioeconomic History   Marital status: Widowed    Spouse name: Not on file   Number of children: Not on file   Years of education: Not on file   Highest education level: Not on file  Occupational History   Not on file  Tobacco Use   Smoking status: Every Day    Packs/day: 2.00    Years: 50.00    Pack years: 100.00    Types: Cigarettes    Start date: 1973   Smokeless tobacco: Never   Tobacco comments:    Currently smoking 3-4 cigs per day as of 03/23/21 ep  Vaping Use   Vaping Use: Never used  Substance and Sexual Activity   Alcohol use: Not Currently   Drug use: Not Currently   Sexual activity: Not Currently  Other Topics Concern   Not on file  Social History Narrative   Not on file   Social Determinants of Health   Financial Resource Strain: Not on file  Food Insecurity: Not on file  Transportation Needs: Not on file  Physical Activity: Not on file  Stress: Not on file  Social Connections: Not on file  Intimate Partner Violence: Not on file  No Known Allergies   Outpatient Medications Prior to Visit  Medication Sig Dispense Refill   albuterol (VENTOLIN HFA) 108 (90 Base) MCG/ACT inhaler Inhale 2 puffs into the lungs every 6 (six) hours as needed for wheezing or shortness of breath.     aspirin 81 MG EC tablet Take by mouth.     lisinopril (ZESTRIL) 10 MG tablet Take 10 mg by mouth daily.     metFORMIN (GLUCOPHAGE) 500 MG tablet Take 1,000 mg by mouth daily with breakfast.     Multiple Vitamins-Minerals (CENTRUM SILVER 50+MEN) TABS Take by mouth.     TRELEGY ELLIPTA 100-62.5-25 MCG/INH AEPB Inhale 1 puff into the lungs daily.     omeprazole (PRILOSEC) 20 MG capsule Take 1 capsule (20 mg total) by mouth daily. To gastric protection while taking NSAIDs 30 capsule 0    SYMBICORT 160-4.5 MCG/ACT inhaler Inhale 2 puffs into the lungs 2 (two) times daily.     No facility-administered medications prior to visit.       Objective:   Physical Exam:  General appearance: 66 y.o., male, NAD, conversant  Eyes: anicteric sclerae, moist conjunctivae; no lid-lag; PERRL, tracking appropriately HENT: NCAT; oropharynx, MMM, no mucosal ulcerations; normal hard and soft palate Neck: Trachea midline; no lymphadenopathy, no JVD Lungs: CTAB, no crackles, no wheeze, with normal respiratory effort CV: RRR, no MRGs  Abdomen: Soft, non-tender; non-distended, BS present  Extremities: No peripheral edema, radial and DP pulses present bilaterally  Skin: Normal temperature, turgor and texture; no rash Psych: Appropriate affect Neuro: Alert and oriented to person and place, no focal deficit    Vitals:   03/23/21 1339  BP: 120/80  Pulse: 81  Temp: 97.7 F (36.5 C)  TempSrc: Oral  SpO2: 98%  Weight: 191 lb 9.6 oz (86.9 kg)  Height: 6\' 1"  (1.854 m)   98% on RA BMI Readings from Last 3 Encounters:  03/23/21 25.28 kg/m  09/08/20 24.20 kg/m  08/13/20 24.40 kg/m   Wt Readings from Last 3 Encounters:  03/23/21 191 lb 9.6 oz (86.9 kg)  09/08/20 183 lb 6.8 oz (83.2 kg)  08/13/20 184 lb 15.5 oz (83.9 kg)     CBC    Component Value Date/Time   WBC 8.3 12/28/2010 0946   RBC 5.03 12/28/2010 0946   HGB 14.8 12/28/2010 0946   HCT 42.6 12/28/2010 0946   PLT 244 12/28/2010 0946   MCV 84.7 12/28/2010 0946   MCH 29.4 12/28/2010 0946   MCHC 34.7 12/28/2010 0946   RDW 13.8 12/28/2010 0946   LYMPHSABS 4.1 (H) 11/01/2009 1300   MONOABS 0.6 11/01/2009 1300   EOSABS 0.3 11/01/2009 1300   BASOSABS 0.0 11/01/2009 1300      Chest Imaging:  CT Chest 03/11/21 reviewed by me remarkable for 2.8cm part solid/cystic nodule RLL superior segment. Borderline 4R, 4L adenopathy  Pulmonary Functions Testing Results: No flowsheet data found.      Assessment & Plan:   #  2.8cm RLL superior segment cavitary/cystic nodule: although he gives history of a couple macroaspiration events it sounds like he really doesn't have any infectious symptoms and degree of interval growth is concerning for lung CA. Even if PET doesn't clearly demonstrate nodal involvement I think he'll need formal staging EBUS due to borderline size of LAD prior to referral to CT surgery.  # COPD gold functional group B: and doing overall well on trelegy  # Smoking  Plan: - ANCAs, RF, CCP, quantiferon given cavitary/cystic appearance - PFTs - PET/CT ASAP -  continue trelegy - encouraged to stop smoking - declines chantix, patches/gum     Maryjane Hurter, MD [facility] 03/23/2021 2:06 PM

## 2021-03-23 ENCOUNTER — Ambulatory Visit (INDEPENDENT_AMBULATORY_CARE_PROVIDER_SITE_OTHER): Payer: Medicare Other | Admitting: Student

## 2021-03-23 ENCOUNTER — Encounter: Payer: Self-pay | Admitting: Student

## 2021-03-23 ENCOUNTER — Other Ambulatory Visit: Payer: Self-pay

## 2021-03-23 VITALS — BP 120/80 | HR 81 | Temp 97.7°F | Ht 73.0 in | Wt 191.6 lb

## 2021-03-23 DIAGNOSIS — R911 Solitary pulmonary nodule: Secondary | ICD-10-CM | POA: Diagnosis not present

## 2021-03-23 NOTE — Patient Instructions (Signed)
-   STOP smoking - PFTs we will schedule (breathing tests) - PET/CT to highlight any areas that may be concerning for cancer (although infection can also be highlighted by this scan) - I'll be in touch with results to discuss whether we need to pursue bronchoscopy (likely a procedure called EBUS - endobronchial ultrasound)

## 2021-03-25 LAB — ANA: Anti Nuclear Antibody (ANA): NEGATIVE

## 2021-03-25 LAB — RHEUMATOID FACTOR: Rheumatoid fact SerPl-aCnc: <14 IU/mL (ref <14)

## 2021-03-25 LAB — ANCA SCREEN W REFLEX TITER: ANCA Screen: NEGATIVE

## 2021-03-25 LAB — CYCLIC CITRUL PEPTIDE ANTIBODY, IGG: Cyclic Citrullin Peptide Ab: <16 UNITS

## 2021-03-29 LAB — QUANTIFERON-TB GOLD PLUS
Mitogen-NIL: >10 IU/mL
NIL: 0.03 IU/mL
QuantiFERON-TB Gold Plus: NEGATIVE
TB1-NIL: 0.01 IU/mL
TB2-NIL: 0 IU/mL

## 2021-04-04 ENCOUNTER — Other Ambulatory Visit: Payer: Self-pay

## 2021-04-04 ENCOUNTER — Telehealth: Payer: Self-pay | Admitting: Student

## 2021-04-04 ENCOUNTER — Ambulatory Visit (HOSPITAL_COMMUNITY)
Admission: RE | Admit: 2021-04-04 | Discharge: 2021-04-04 | Disposition: A | Discharge status: Home / Self Care | Payer: Medicare Other | Source: Ambulatory Visit | Attending: Student | Admitting: Student

## 2021-04-04 DIAGNOSIS — R911 Solitary pulmonary nodule: Secondary | ICD-10-CM | POA: Insufficient documentation

## 2021-04-04 DIAGNOSIS — I7143 Infrarenal abdominal aortic aneurysm, without rupture: Secondary | ICD-10-CM | POA: Insufficient documentation

## 2021-04-04 LAB — GLUCOSE, CAPILLARY: Glucose-Capillary: 129 mg/dL — ABNORMAL HIGH (ref 70–99)

## 2021-04-04 IMAGING — CT NM PET TUM IMG INITIAL (PI) SKULL BASE T - THIGH
1 of 7 series · 3 of 25 positions shown · non-contrast
Comparison: [DATE].

CLINICAL DATA: Initial treatment strategy for lung nodule.

EXAM:
NUCLEAR MEDICINE PET SKULL BASE TO THIGH
TECHNIQUE: 9.41 mCi F-18 FDG was injected intravenously. Full-ring PET imaging
was performed from the skull base to thigh after the radiotracer. CT
data was obtained and used for attenuation correction and anatomic
localization.
Fasting blood glucose: 129 mg/dl

[Series 4: ct sk_thigh 5.0 bf37 · axial · 5.0mm · 0.98mm/px · z∈[-1403,-459]mm · 3 of 237 slices shown]
[im 1/237  brain]
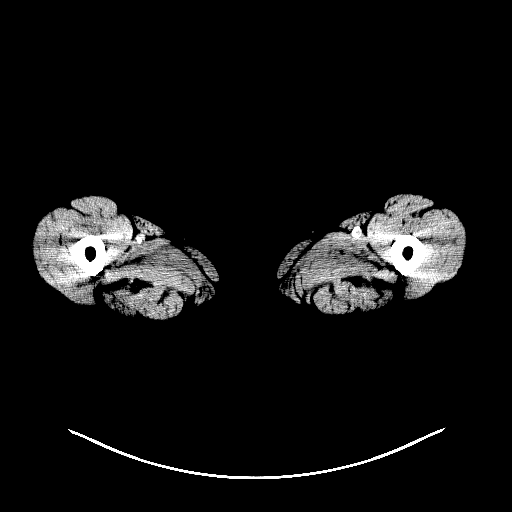
[im 60/237  brain]
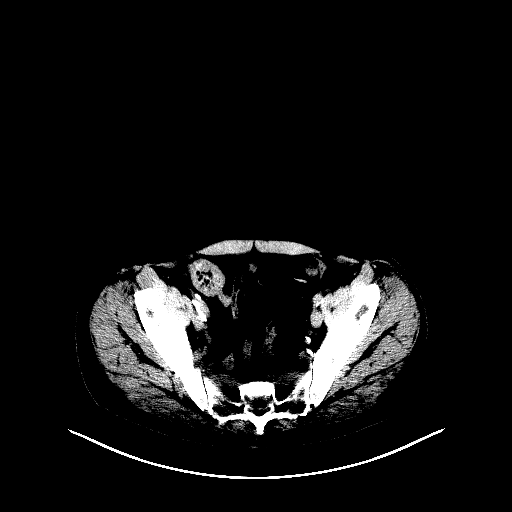
[im 237/237  brain]
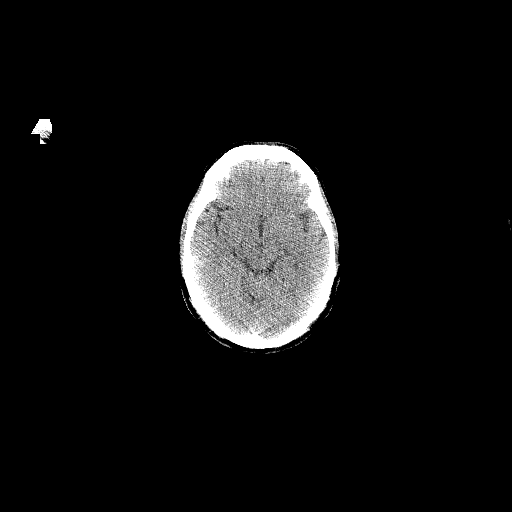

[3 of 25 positions shown; findings below may reference images not displayed]

FINDINGS: Mediastinal blood pool activity: SUV max

Liver activity: SUV max NA

NECK: No hypermetabolic lymph nodes in the neck.

Incidental CT findings: none

CHEST: The cystic area in the RIGHT lower lobe with irregular wall
displays markedly increased metabolic activity accounting for the
limited amount of "soft tissue" in the area at maximum SUV of
(image 36/8 and image 76/4w). Lesion abuts the major fissure. No
additional hypermetabolic foci are present in the chest.

Incidental CT findings: Signs of pulmonary emphysema and RIGHT
middle lobe scarring as on the previous chest CT. Aortic
atherosclerosis and coronary artery disease. No substantial
pericardial effusion.

ABDOMEN/PELVIS: No abnormal hypermetabolic activity within the
liver, pancreas, adrenal glands, or spleen. No hypermetabolic lymph
nodes in the abdomen or pelvis. Nonspecific foci of uptake in the
LEFT gluteal fold and in the RIGHT inguinal crease. Area in the
RIGHT inguinal crease appears deep to the subcutaneous fat with a
maximum SUV of 5.6. (Image 213/4) mild skin thickening in this area.

Soft tissue nodule in the LEFT gluteal crease (image 221/4) this
measures 10 mm and displays a maximum SUV of 7.3.

Suggestion of thickening also along the LEFT hemiscrotum with a
maximum SUV of 5.6, scrotal wall at 8 mm thickness at this level
(image 225/4

Incidental CT findings: Liver with smooth contour. Cholelithiasis.
No acute finding relative to pancreas, spleen, adrenal glands,
kidneys, urinary bladder, stomach, small or large bowel. Colonic
diverticulosis and diverticular changes of the sigmoid. Moderate
stool in the colon. Appendix is normal. Aortic atherosclerosis with
2.9 x 3.0 cm mild aneurysmal dilation of the infrarenal abdominal
aorta.

SKELETON: No focal hypermetabolic activity to suggest skeletal
metastasis.

Incidental CT findings: No acute bone finding. No destructive bone
process. Spinal degenerative changes. Signs of RIGHT shoulder
arthroplasty and cervical ACDF.
IMPRESSION: Increased metabolic activity associated with the cavity in the
superior segment of the RIGHT lower lobe abutting the major fissure
and displaying mildly irregular wall thickening, concerning for
primary bronchogenic neoplasm.

Nodularity in the LEFT gluteal fold with increased metabolic
activity, raising the question of cutaneous neoplasm. Direct
clinical inspection is suggested, also with increased metabolic
activity in the RIGHT inguinal crease with suggestion of mild skin
thickening in this location. Would also correlate with any signs of
inflammation in these locations.

Skin thickening about the LEFT scrotum with increased metabolic
activity as well, nonspecific either neoplastic or inflammatory.
Correlate with direct clinical inspection and with sonographic
assessment as warranted.

3 cm infrarenal abdominal aortic aneurysm. Recommend follow-up every
3 years.

Reference: [HOSPITAL] [ZA];[DATE].

Aortic Atherosclerosis ([ZA]-[ZA]) and Emphysema ([ZA]-[ZA]).

Aortic aneurysm NOS ([ZA]-[ZA]).

## 2021-04-04 MED ORDER — FLUDEOXYGLUCOSE F - 18 (FDG) INJECTION
9.8000 | Freq: Once | INTRAVENOUS | Status: AC
  Administered 2021-04-04: 9.41 via INTRAVENOUS

## 2021-04-04 NOTE — Telephone Encounter (Signed)
Received call report from [place] with [facility] on patient's Pet scan done on 04/04/21. Dr. Verlee Monte, please review the result/impression copied below:  IMPRESSION: Increased metabolic activity associated with the cavity in the superior segment of the RIGHT lower lobe abutting the major fissure and displaying mildly irregular wall thickening, concerning for primary bronchogenic neoplasm.   Nodularity in the LEFT gluteal fold with increased metabolic activity, raising the question of cutaneous neoplasm. Direct clinical inspection is suggested, also with increased metabolic activity in the RIGHT inguinal crease with suggestion of mild skin thickening in this location. Would also correlate with any signs of inflammation in these locations.   Skin thickening about the LEFT scrotum with increased metabolic activity as well, nonspecific either neoplastic or inflammatory. Correlate with direct clinical inspection and with sonographic assessment as warranted.   3 cm infrarenal abdominal aortic aneurysm. Recommend follow-up every 3 years.  Please advise, thank you.   Message routed to Dr. Verlee Monte to advise

## 2021-04-05 NOTE — Telephone Encounter (Signed)
[facility] pulm PFT room had a cancellation 04/06/21. Called and spoke with Patient.  Patient scheduled full PFT 04/06/21 at 1100.  Patient bring covid vaccine card and aware of PFT instructions.

## 2021-04-05 NOTE — Telephone Encounter (Signed)
Patient is currently scheduled 05/09/21 for full PFT and follow up afterwards. The first PFT opening at this time is 05/06/21.  PCC's may be able to get Patient scheduled in hospital for PFT before 05/09/21?  Message routed to Dr. Verlee Monte and [facility] to advise

## 2021-04-05 NOTE — Telephone Encounter (Signed)
Called and discussed results. PET/CT with FDG avid cavitary/cystic nodule similar size to recent CT. There's also peri-anal FDG nodule, right inguinal nodule. He says he's had a boil in those areas and occasionally expresses foul purulent material from those sites. Hopefully they represent infection. I said I'd reach out to our staff to schedule PFTs ASAP. If lung function ok will refer to CT surgery for consideration of some form of resection - primary lesion is <3cm, not centrally located and there is no mediastinal avidity, nor lymph node enlargement.

## 2021-04-06 ENCOUNTER — Other Ambulatory Visit: Payer: Self-pay

## 2021-04-06 ENCOUNTER — Ambulatory Visit (INDEPENDENT_AMBULATORY_CARE_PROVIDER_SITE_OTHER): Payer: Medicare Other | Admitting: Student

## 2021-04-06 DIAGNOSIS — R911 Solitary pulmonary nodule: Secondary | ICD-10-CM | POA: Diagnosis not present

## 2021-04-06 LAB — PULMONARY FUNCTION TEST
DL/VA % pred: 74 %
DL/VA: 3.04 ml/min/mmHg/L
DLCO cor % pred: 66 %
DLCO cor: 18.81 ml/min/mmHg
DLCO unc % pred: 66 %
DLCO unc: 18.81 ml/min/mmHg
FEF 25-75 Post: 0.82 L/sec
FEF 25-75 Pre: 0.72 L/sec
FEF2575-%Change-Post: 14 %
FEF2575-%Pred-Post: 28 %
FEF2575-%Pred-Pre: 25 %
FEV1-%Change-Post: 2 %
FEV1-%Pred-Post: 41 %
FEV1-%Pred-Pre: 40 %
FEV1-Post: 1.52 L
FEV1-Pre: 1.49 L
FEV1FVC-%Change-Post: -3 %
FEV1FVC-%Pred-Pre: 65 %
FEV6-%Change-Post: 4 %
FEV6-%Pred-Post: 67 %
FEV6-%Pred-Pre: 64 %
FEV6-Post: 3.13 L
FEV6-Pre: 3.01 L
FEV6FVC-%Change-Post: -1 %
FEV6FVC-%Pred-Post: 103 %
FEV6FVC-%Pred-Pre: 104 %
FVC-%Change-Post: 5 %
FVC-%Pred-Post: 65 %
FVC-%Pred-Pre: 61 %
FVC-Post: 3.2 L
FVC-Pre: 3.04 L
Post FEV1/FVC ratio: 48 %
Post FEV6/FVC ratio: 98 %
Pre FEV1/FVC ratio: 49 %
Pre FEV6/FVC Ratio: 99 %

## 2021-04-06 NOTE — Patient Instructions (Signed)
Full PFT performed today. °

## 2021-04-06 NOTE — Progress Notes (Signed)
Full PFT performed today. °

## 2021-04-18 ENCOUNTER — Ambulatory Visit (INDEPENDENT_AMBULATORY_CARE_PROVIDER_SITE_OTHER): Payer: Medicare Other | Admitting: Gastroenterology

## 2021-04-18 ENCOUNTER — Other Ambulatory Visit: Payer: Self-pay

## 2021-04-18 ENCOUNTER — Encounter: Payer: Self-pay | Admitting: Gastroenterology

## 2021-04-18 DIAGNOSIS — K529 Noninfective gastroenteritis and colitis, unspecified: Secondary | ICD-10-CM | POA: Diagnosis not present

## 2021-04-18 NOTE — Progress Notes (Signed)
Primary Care Physician:  Richelle Ito  Primary Gastroenterologist:  Elon Alas. Abbey Chatters, DO   Chief Complaint  Patient presents with   tcs    Had done 12/2020, showed colitis. Not having any symptoms. Needs to have lung cancer surgery    HPI:  Stephen Diaz is a 65 y.o. male here at the request of Dr. Adelina Mings for further evaluation of colitis.  Back in July, patient completed surveillance colonoscopy which showed diverticulosis, left-sided colitis with erythema in the sigmoid and descending colon.  Biopsies showed focal mild acute colitis favoring infectious versus ischemic.  Patient states he has had no symptoms.  Denies diarrhea.  He cannot remember which bowel prep he took for his procedure but did not require an enema.  Typically stools occur 1-2 times daily, [place] 4.  No blood in the stool or melena.  No abdominal pain, heartburn, vomiting, dysphagia.  Takes a low-dose aspirin daily, rare ibuprofen but no other NSAIDs or aspirin powders.  Patient had a colonoscopy with Dr. Oneida Alar in May 2011, at that time he had 2 tubular adenomas removed, diverticulosis, descending colon erythema with mild ulcerations, biopsies were benign.  Favored NSAID induced versus ischemia.  No further management needed as patient was asymptomatic.  Patient recalls his cousin has a history of Crohn's disease.  Patient diagnosed with lung cancer recently, plans for surgery and radiation.  Recent PET scan and screening chest CT as outlined below.  PET scan April 04, 2021: IMPRESSION: Increased metabolic activity associated with the cavity in the superior segment of the RIGHT lower lobe abutting the major fissure and displaying mildly irregular wall thickening, concerning for primary bronchogenic neoplasm.   Nodularity in the LEFT gluteal fold with increased metabolic activity, raising the question of cutaneous neoplasm. Direct clinical inspection is suggested, also with increased  metabolic activity in the RIGHT inguinal crease with suggestion of mild skin thickening in this location. Would also correlate with any signs of inflammation in these locations.   Skin thickening about the LEFT scrotum with increased metabolic activity as well, nonspecific either neoplastic or inflammatory. Correlate with direct clinical inspection and with sonographic assessment as warranted.   3 cm infrarenal abdominal aortic aneurysm. Recommend follow-up every 3 years.   Reference: J Am Coll Radiol 9509;32:671-245.   Aortic Atherosclerosis (ICD10-I70.0) and Emphysema (ICD10-J43.9).   Aortic aneurysm NOS (ICD10-I71.9).   CT lung cancer screening low-dose March 11, 2021: 1. Lung-RADS 4B, suspicious. Additional imaging evaluation or  consultation with Pulmonology or Thoracic Surgery recommended. 2.8  cm part solid nodule, superior segment right lower lobe, 173/4.  2. Stable 4.1 cm ascending thoracic aortic aneurysm. Recommend  annual imaging followup by CTA or MRA. This recommendation follows  2010 ACCF/AHA/AATS/ACR/ASA/SCA/SCAI/SIR/STS/SVM Guidelines for the  Diagnosis and Management of Patients with Thoracic Aortic Disease.  Circulation. 2010; 121: Y099-I338. Aortic aneurysm NOS (ICD10-I71.9)  3. Coronary artery calcifications.  4. Gallstones.  5. Aortic Atherosclerosis (ICD10-I70.0) and Emphysema (ICD10-J43.9).       Current Outpatient Medications  Medication Sig Dispense Refill   albuterol (VENTOLIN HFA) 108 (90 Base) MCG/ACT inhaler Inhale 2 puffs into the lungs every 6 (six) hours as needed for wheezing or shortness of breath.     aspirin 81 MG EC tablet Take by mouth.     lisinopril (ZESTRIL) 10 MG tablet Take 10 mg by mouth daily.     metFORMIN (GLUCOPHAGE) 500 MG tablet Take 1,000 mg by mouth daily with breakfast.     Multiple Vitamins-Minerals (CENTRUM  SILVER 50+MEN) TABS Take by mouth.     TRELEGY ELLIPTA 100-62.5-25 MCG/INH AEPB Inhale 1 puff into the lungs  daily.     No current facility-administered medications for this visit.    Allergies as of 04/18/2021   (No Known Allergies)    Past Medical History:  Diagnosis Date   Arthritis    right shoulder   COPD (chronic obstructive pulmonary disease) (HCC)    Diabetes mellitus without complication (HCC)    Hypertension    Right foot drop    from pinched nerve in back, wears brace    Past Surgical History:  Procedure Laterality Date   CATARACT EXTRACTION Left    CATARACT EXTRACTION W/PHACO Right 08/13/2020   Procedure: CATARACT EXTRACTION PHACO AND INTRAOCULAR LENS PLACEMENT ([place]);  Surgeon: Baruch Goldmann, MD;  Location: [facility];  Service: Ophthalmology;  Laterality: Right;  CDE: 8.24   COLONOSCOPY  12/2020   Dr. Adelina Mings (general surgeon): diverticulsos and left sided colitis with erythema in the sigmoid and descending colon. bx: focal mild acute colitis ?infection vs ischemia   COLONOSCOPY  10/2009   Dr. Carlyon Prows [place]: diverticulosis, two tubular adenomas removed. descending colon erythema and mild ulcerations noted with benign biopsies.   HERNIA REPAIR     REVERSE SHOULDER ARTHROPLASTY Right 09/08/2020   Procedure: REVERSE TOTAL SHOULDER ARTHROPLASTY;  Surgeon: Hiram Gash, MD;  Location: [facility];  Service: Orthopedics;  Laterality: Right;   screws and steel plate in neck  (S2-8)     10-12 years ago  x2    Family History  Problem Relation Age of Onset   Crohn's disease Cousin    Colon cancer Neg Hx     Social History   Socioeconomic History   Marital status: Widowed    Spouse name: Not on file   Number of children: Not on file   Years of education: Not on file   Highest education level: Not on file  Occupational History   Not on file  Tobacco Use   Smoking status: Every Day    Packs/day: 2.00    Years: 50.00    Pack years: 100.00    Types: Cigarettes    Start date: 1973   Smokeless tobacco: Never   Tobacco comments:    Currently smoking  3-4 cigs per day as of 03/23/21 ep  Vaping Use   Vaping Use: Never used  Substance and Sexual Activity   Alcohol use: Not Currently   Drug use: Not Currently   Sexual activity: Not Currently  Other Topics Concern   Not on file  Social History Narrative   Not on file   Social Determinants of Health   Financial Resource Strain: Not on file  Food Insecurity: Not on file  Transportation Needs: Not on file  Physical Activity: Not on file  Stress: Not on file  Social Connections: Not on file  Intimate Partner Violence: Not on file      ROS:  General: Negative for anorexia, weight loss, fever, chills, fatigue, weakness. Eyes: Negative for vision changes.  ENT: Negative for hoarseness, difficulty swallowing , nasal congestion. CV: Negative for chest pain, angina, palpitations, dyspnea on exertion, peripheral edema.  Respiratory: Negative for dyspnea at rest, dyspnea on exertion, cough, sputum, wheezing.  GI: See history of present illness. GU:  Negative for dysuria, hematuria, urinary incontinence, urinary frequency, nocturnal urination.  MS: Negative for joint pain, low back pain.  Derm: Negative for rash or itching.  Neuro: Negative for weakness,  abnormal sensation, seizure, frequent headaches, memory loss, confusion.  Psych: Negative for anxiety, depression, suicidal ideation, hallucinations.  Endo: Negative for unusual weight change.  Heme: Negative for bruising or bleeding. Allergy: Negative for rash or hives.    Physical Examination:  BP 133/80   Pulse 75   Temp (!) 97.5 F (36.4 C)   Ht 6\' 1"  (1.854 m)   Wt 194 lb (88 kg)   BMI 25.60 kg/m    General: Well-nourished, well-developed in no acute distress.  Head: Normocephalic, atraumatic.   Eyes: Conjunctiva pink, no icterus. Mouth: masked Neck: Supple without thyromegaly, masses, or lymphadenopathy.  Lungs: Clear to auscultation bilaterally.  Heart: Regular rate and rhythm, no murmurs rubs or gallops.   Abdomen: Bowel sounds are normal, nontender, nondistended, no hepatosplenomegaly or masses, no abdominal bruits or    hernia , no rebound or guarding.   Rectal: not performed Extremities: No lower extremity edema. No clubbing or deformities.  Neuro: Alert and oriented x 4 , grossly normal neurologically.  Skin: Warm and dry, no rash or jaundice.   Psych: Alert and cooperative, normal mood and affect.    Imaging Studies: NM PET Image Initial (PI) Skull Base To Thigh  Result Date: 04/04/2021 CLINICAL DATA:  Initial treatment strategy for lung nodule. EXAM: NUCLEAR MEDICINE PET SKULL BASE TO THIGH TECHNIQUE: 9.41 mCi F-18 FDG was injected intravenously. Full-ring PET imaging was performed from the skull base to thigh after the radiotracer. CT data was obtained and used for attenuation correction and anatomic localization. Fasting blood glucose: 129 mg/dl COMPARISON:  March 11, 2021. FINDINGS: Mediastinal blood pool activity: SUV max 2.23 Liver activity: SUV max NA NECK: No hypermetabolic lymph nodes in the neck. Incidental CT findings: none CHEST: The cystic area in the RIGHT lower lobe with irregular wall displays markedly increased metabolic activity accounting for the limited amount of "soft tissue" in the area at maximum SUV of 6.48 (image 36/8 and image 76/4w). Lesion abuts the major fissure. No additional hypermetabolic foci are present in the chest. Incidental CT findings: Signs of pulmonary emphysema and RIGHT middle lobe scarring as on the previous chest CT. Aortic atherosclerosis and coronary artery disease. No substantial pericardial effusion. ABDOMEN/PELVIS: No abnormal hypermetabolic activity within the liver, pancreas, adrenal glands, or spleen. No hypermetabolic lymph nodes in the abdomen or pelvis. Nonspecific foci of uptake in the LEFT gluteal fold and in the RIGHT inguinal crease. Area in the RIGHT inguinal crease appears deep to the subcutaneous fat with a maximum SUV of 5.6.  (Image 213/4) mild skin thickening in this area. Soft tissue nodule in the LEFT gluteal crease (image 221/4) this measures 10 mm and displays a maximum SUV of 7.3. Suggestion of thickening also along the LEFT hemiscrotum with a maximum SUV of 5.6, scrotal wall at 8 mm thickness at this level (image 225/4 Incidental CT findings: Liver with smooth contour. Cholelithiasis. No acute finding relative to pancreas, spleen, adrenal glands, kidneys, urinary bladder, stomach, small or large bowel. Colonic diverticulosis and diverticular changes of the sigmoid. Moderate stool in the colon. Appendix is normal. Aortic atherosclerosis with 2.9 x 3.0 cm mild aneurysmal dilation of the infrarenal abdominal aorta. SKELETON: No focal hypermetabolic activity to suggest skeletal metastasis. Incidental CT findings: No acute bone finding. No destructive bone process. Spinal degenerative changes. Signs of RIGHT shoulder arthroplasty and cervical ACDF. IMPRESSION: Increased metabolic activity associated with the cavity in the superior segment of the RIGHT lower lobe abutting the major fissure and displaying mildly irregular wall  thickening, concerning for primary bronchogenic neoplasm. Nodularity in the LEFT gluteal fold with increased metabolic activity, raising the question of cutaneous neoplasm. Direct clinical inspection is suggested, also with increased metabolic activity in the RIGHT inguinal crease with suggestion of mild skin thickening in this location. Would also correlate with any signs of inflammation in these locations. Skin thickening about the LEFT scrotum with increased metabolic activity as well, nonspecific either neoplastic or inflammatory. Correlate with direct clinical inspection and with sonographic assessment as warranted. 3 cm infrarenal abdominal aortic aneurysm. Recommend follow-up every 3 years. Reference: J Am Coll Radiol 9702;63:785-885. Aortic Atherosclerosis (ICD10-I70.0) and Emphysema (ICD10-J43.9). Aortic  aneurysm NOS (ICD10-I71.9). Electronically Signed   By: Zetta Bills M.D.   On: 04/04/2021 10:34     Assessment:  Colitis noted on routine colonoscopy: Patient completed colonoscopy in July for history of tubular adenomas.  Incidentally found to have colitis involving the sigmoid and descending colon.  Biopsies favoring infectious versus ischemic colitis.  Interestingly patient had similar findings on colonoscopy in 2011 at that time favoring NSAID induced colitis versus ischemic colitis.  Patient denies any symptoms of diarrhea, abdominal pain, blood in the stool.  Asymptomatic both times he had his colonoscopies.  He denies any symptoms suggestive of chronic mesenteric ischemia.  He is on low-dose aspirin with rare ibuprofen, no significant NSAIDs otherwise.  Would not anticipate these findings to be problematic with upcoming lung cancer treatment.   Plan: Patient is aware of warning signs (abdominal pain, diarrhea, blood in the stools), for which he should let us know if he develops any. We will request color pictures from recent colonoscopy to review. He will need surveillance colonoscopy in 5 years for history of colon polyps can be completed by Dr. Abbey Chatters if he desires or can return to [facility] [place].  Return to our office as needed.

## 2021-04-18 NOTE — Patient Instructions (Signed)
We will request copy of color pictures from your recent colonoscopy but I do not anticipate any further work up needed UNLESS you have diarrhea, rectal bleeding, or abdominal pain.  You will need to have your next colonoscopy in 5 years for prior history of colon polyps. You can have that completed with Dr. Abbey Chatters here or you can return to Dr. Rocco Serene.  I will send a copy of today's visit to your PCP, Dr. Rocco Serene, and Dr. Verlee Monte.  Return to our office as needed.

## 2021-04-19 NOTE — Telephone Encounter (Signed)
Pt had full PFT on 04/06/2021. ROV has been changed to 04/21/21 (formerly 11/28). Will forward to Dr. Verlee Monte as Juluis Rainier.

## 2021-04-20 NOTE — Progress Notes (Signed)
Synopsis: Referred for lung nodule by [name], PA-C  Subjective:   PATIENT ID: Stephen Diaz GENDER: male DOB: 02/05/1956, MRN: [id number]  Chief Complaint  Patient presents with   Follow-up    Recent CT and PFT. Here to discuss results. Still smoking.     65yM with history of COPD on trelegy, smoking ~50 py referred for lung nodule  He doesn't have much of a cough. No fever. No recent pneumonia. No CP. No night sweats drenching sheets. No unintentional weight loss.   He has some DOE, trelegy has helped quite a bit. He has had possibly one exacerbation of COPD this year requiring prednisone, didn't require hospitalization. Has never been hospitalized for COPD. If he's taken his trelegy he could climb about 4 flights of stairs without having to stop due to dyspnea.   Aunt may have died from lung cancer. No other lung disease  He was a Multimedia programmer. Retired now, retired [employer]. No current exposures to dusts/solvents/particulates without a mask over these past 4 years. He has no pets. Has never lived outside of [place]. Regarding TB risk factors has never been incarcerated,   Interval HPI: PET/CT obtained, discussed with tumor board this morning. He's doing overall well, no worsening DOE, no recent exacerbations requiring ABX or steroids. Still smoking a little bit.   Otherwise pertinent review of systems is negative.    Past Medical History:  Diagnosis Date   Arthritis    right shoulder   COPD (chronic obstructive pulmonary disease) (HCC)    Diabetes mellitus without complication (HCC)    Hypertension    Right foot drop    from pinched nerve in back, wears brace     Family History  Problem Relation Age of Onset   Crohn's disease Cousin    Colon cancer Neg Hx      Past Surgical History:  Procedure Laterality Date   CATARACT EXTRACTION Left    CATARACT EXTRACTION W/PHACO Right 08/13/2020   Procedure: CATARACT EXTRACTION PHACO AND INTRAOCULAR LENS PLACEMENT  ([place]);  Surgeon: Baruch Goldmann, MD;  Location: [facility];  Service: Ophthalmology;  Laterality: Right;  CDE: 8.24   COLONOSCOPY  12/2020   Dr. Adelina Mings (general surgeon): diverticulsos and left sided colitis with erythema in the sigmoid and descending colon. bx: focal mild acute colitis ?infection vs ischemia   COLONOSCOPY  10/2009   Dr. Carlyon Prows Fields: diverticulosis, two tubular adenomas removed. descending colon erythema and mild ulcerations noted with benign biopsies.   HERNIA REPAIR     REVERSE SHOULDER ARTHROPLASTY Right 09/08/2020   Procedure: REVERSE TOTAL SHOULDER ARTHROPLASTY;  Surgeon: Hiram Gash, MD;  Location: [place] [facility];  Service: Orthopedics;  Laterality: Right;   screws and steel plate in neck  (T2-6)     10-12 years ago  x2    Social History   Socioeconomic History   Marital status: Widowed    Spouse name: Not on file   Number of children: Not on file   Years of education: Not on file   Highest education level: Not on file  Occupational History   Not on file  Tobacco Use   Smoking status: Every Day    Packs/day: 2.00    Years: 50.00    Pack years: 100.00    Types: Cigarettes    Start date: 1973   Smokeless tobacco: Never   Tobacco comments:    Currently smoking 3-4 cigs per day as of 03/23/21 ep  Vaping  Use   Vaping Use: Never used  Substance and Sexual Activity   Alcohol use: Not Currently   Drug use: Not Currently   Sexual activity: Not Currently  Other Topics Concern   Not on file  Social History Narrative   Not on file   Social Determinants of Health   Financial Resource Strain: Not on file  Food Insecurity: Not on file  Transportation Needs: Not on file  Physical Activity: Not on file  Stress: Not on file  Social Connections: Not on file  Intimate Partner Violence: Not on file     No Known Allergies   Outpatient Medications Prior to Visit  Medication Sig Dispense Refill   albuterol (VENTOLIN HFA) 108 (90 Base)  MCG/ACT inhaler Inhale 2 puffs into the lungs every 6 (six) hours as needed for wheezing or shortness of breath.     aspirin 81 MG EC tablet Take by mouth.     lisinopril (ZESTRIL) 10 MG tablet Take 10 mg by mouth daily.     metFORMIN (GLUCOPHAGE) 500 MG tablet Take 1,000 mg by mouth daily with breakfast.     Multiple Vitamins-Minerals (CENTRUM SILVER 50+MEN) TABS Take by mouth.     TRELEGY ELLIPTA 100-62.5-25 MCG/INH AEPB Inhale 1 puff into the lungs daily.     No facility-administered medications prior to visit.       Objective:   Physical Exam:  General appearance: 65 y.o., male, NAD, conversant  Eyes: anicteric sclerae, moist conjunctivae; no lid-lag; PERRL, tracking appropriately HENT: NCAT; oropharynx, MMM, no mucosal ulcerations; normal hard and soft palate Neck: Trachea midline; no lymphadenopathy, no JVD Lungs: Diminished bilaterally, no crackles, no wheeze, with normal respiratory effort CV: RRR, no MRGs  Abdomen: Soft, non-tender; non-distended, BS present  Extremities: No peripheral edema, radial and DP pulses present bilaterally, +clubbing Skin: Normal temperature, turgor and texture; no rash Psych: Appropriate affect Neuro: Alert and oriented to person and place, no focal deficit    Vitals:   04/21/21 1417  BP: 122/78  Pulse: 84  Temp: (!) 97.5 F (36.4 C)  TempSrc: Oral  SpO2: 98%  Weight: 197 lb 6.4 oz (89.5 kg)  Height: 6\' 1"  (1.854 m)    98% on RA BMI Readings from Last 3 Encounters:  04/21/21 26.04 kg/m  04/18/21 25.60 kg/m  03/23/21 25.28 kg/m   Wt Readings from Last 3 Encounters:  04/21/21 197 lb 6.4 oz (89.5 kg)  04/18/21 194 lb (88 kg)  03/23/21 191 lb 9.6 oz (86.9 kg)     CBC    Component Value Date/Time   WBC 8.3 12/28/2010 0946   RBC 5.03 12/28/2010 0946   HGB 14.8 12/28/2010 0946   HCT 42.6 12/28/2010 0946   PLT 244 12/28/2010 0946   MCV 84.7 12/28/2010 0946   MCH 29.4 12/28/2010 0946   MCHC 34.7 12/28/2010 0946   RDW  13.8 12/28/2010 0946   LYMPHSABS 4.1 (H) 11/01/2009 1300   MONOABS 0.6 11/01/2009 1300   EOSABS 0.3 11/01/2009 1300   BASOSABS 0.0 11/01/2009 1300      Chest Imaging:  CT Chest 03/11/21 reviewed by me remarkable for 2.8cm part solid/cystic nodule RLL superior segment. Borderline 4R, 4L adenopathy  PET/CT 10/24 with hypermetabolic cystic/cavitary RLL lesion, hypermetabolic nodules in groin, peri-rectal area  Pulmonary Functions Testing Results: PFT Results Latest Ref Rng & Units 04/06/2021  FVC-Pre L 3.04  FVC-Predicted Pre % 61  FVC-Post L 3.20  FVC-Predicted Post % 65  Pre FEV1/FVC % % 49  Post  FEV1/FCV % % 48  FEV1-Pre L 1.49  FEV1-Predicted Pre % 40  FEV1-Post L 1.52  DLCO uncorrected ml/min/mmHg 18.81  DLCO UNC% % 66  DLCO corrected ml/min/mmHg 18.81  DLCO COR %Predicted % 66  DLVA Predicted % 74        Assessment & Plan:   # 2.8cm RLL superior segment hypermetabolic cavitary/cystic nodule:  Discussed this morning at tumor board. Too risky and low yield for attempt at biopsy. Despite ventilatory defect, has pretty good functional status, ok DLCO - referral placed today for CT surgery.  # COPD gold functional group B: and doing overall well on trelegy  # Smoking  Plan: - continue trelegy - chantix prescribed - CT surgery urgent referral placed  - has already had flu and covid booster shots  I spent 42 minutes dedicated to the care of this patient on the date of this encounter to include pre-visit review of records, face-to-face time with the patient and phone conversation with his daughter discussing conditions above, post visit ordering of testing, clinical documentation with the electronic health record, making appropriate referrals as documented, and communicating necessary findings to members of the patients care team.       Maryjane Hurter, MD [facility] 04/21/2021 2:58 PM

## 2021-04-21 ENCOUNTER — Ambulatory Visit (INDEPENDENT_AMBULATORY_CARE_PROVIDER_SITE_OTHER): Payer: Medicare Other | Admitting: Student

## 2021-04-21 ENCOUNTER — Other Ambulatory Visit: Payer: Self-pay | Admitting: *Deleted

## 2021-04-21 ENCOUNTER — Other Ambulatory Visit: Payer: Self-pay

## 2021-04-21 VITALS — BP 122/78 | HR 84 | Temp 97.5°F | Ht 73.0 in | Wt 197.4 lb

## 2021-04-21 DIAGNOSIS — J449 Chronic obstructive pulmonary disease, unspecified: Secondary | ICD-10-CM | POA: Diagnosis not present

## 2021-04-21 DIAGNOSIS — R6889 Other general symptoms and signs: Secondary | ICD-10-CM

## 2021-04-21 MED ORDER — VARENICLINE TARTRATE 1 MG PO TABS: 1.0000 mg | ORAL_TABLET | Freq: Two times a day (BID) | ORAL | 1 refills | Status: DC

## 2021-04-21 MED ORDER — VARENICLINE TARTRATE 0.5 MG X 11 & 1 MG X 42 PO TBPK: ORAL_TABLET | ORAL | 0 refills | Status: DC

## 2021-04-21 NOTE — Progress Notes (Signed)
The proposed treatment discussed in conference is for discussion purpose only and is not a binding recommendation. The patient was not been physically examined, or presented with their treatment options. Therefore, final treatment plans cannot be decided.  

## 2021-04-21 NOTE — Patient Instructions (Addendum)
-   Chantix prescription sent to your pharmacy. See instructions about gradually ramping up dose - Referral placed today for cardiothoracic surgery. Call me if you haven't heard about scheduling this by next Thursday ([phone]) - keep using your trelegy 1 puff once daily

## 2021-05-04 ENCOUNTER — Encounter: Payer: Medicare Other | Admitting: Thoracic Surgery (Cardiothoracic Vascular Surgery)

## 2021-05-09 ENCOUNTER — Ambulatory Visit: Payer: Medicare Other | Admitting: Student

## 2021-05-12 ENCOUNTER — Other Ambulatory Visit: Payer: Self-pay | Admitting: *Deleted

## 2021-05-12 ENCOUNTER — Encounter: Payer: Self-pay | Admitting: *Deleted

## 2021-05-12 ENCOUNTER — Institutional Professional Consult (permissible substitution) (INDEPENDENT_AMBULATORY_CARE_PROVIDER_SITE_OTHER): Payer: Medicare Other | Admitting: Thoracic Surgery (Cardiothoracic Vascular Surgery)

## 2021-05-12 ENCOUNTER — Other Ambulatory Visit: Payer: Self-pay

## 2021-05-12 VITALS — BP 123/75 | HR 83 | Resp 20 | Ht 73.0 in | Wt 195.0 lb

## 2021-05-12 DIAGNOSIS — R911 Solitary pulmonary nodule: Secondary | ICD-10-CM | POA: Diagnosis not present

## 2021-05-12 DIAGNOSIS — J432 Centrilobular emphysema: Secondary | ICD-10-CM | POA: Diagnosis not present

## 2021-05-12 DIAGNOSIS — Z5181 Encounter for therapeutic drug level monitoring: Secondary | ICD-10-CM

## 2021-05-12 DIAGNOSIS — J449 Chronic obstructive pulmonary disease, unspecified: Secondary | ICD-10-CM | POA: Insufficient documentation

## 2021-05-12 DIAGNOSIS — C3431 Malignant neoplasm of lower lobe, right bronchus or lung: Secondary | ICD-10-CM

## 2021-05-12 DIAGNOSIS — E119 Type 2 diabetes mellitus without complications: Secondary | ICD-10-CM | POA: Insufficient documentation

## 2021-05-12 NOTE — Progress Notes (Addendum)
PCP is [name], PA-C Referring Provider is Maryjane Hurter, MD  Chief Complaint  Patient presents with   Lung Cancer    Surgical consult, PET Scan 04/04/21, PFT's 04/06/21, Chest CT 03/14/21    HPI: Stephen Diaz is sent for consultation regarding a cavitary lung mass in the right lower lobe.  Stephen Diaz is a 65 year old man with a history of tobacco abuse, COPD, type 2 diabetes, hypertension, arthritis, and right foot drop.  He recently had a low-dose CT for lung cancer screening.  It showed a cavitary lesion in the right lower lobe had increased in size significantly compared to the scan from a year prior.  It initially was 1.5 cm and thin-walled.  It now measures 2.8 cm and there was variable wall thickness up to 4 mm in some areas.  There was some borderline adenopathy.  He was referred to Dr. Verlee Monte.  A PET/CT showed this nodule was intensely hypermetabolic with an SUV greater than 6.  He smoked about a pack and a half of cigarettes daily for over 40 years.  He recently has cut back to 3 to 4 cigarettes a day.  He feels like the Chantix is helping.  He gets short of breath when he carries things like his fishing gear.  He denies any chest pain, pressure, or tightness.  Uses his rescue inhaler a couple of times a week.  He can walk up more than 2 flights of stairs.  No change in appetite or weight loss.  No unusual headaches or visual changes.  He can walk about three fourths of a mile before having to stop due to cramping in his left calf.  Zubrod Score: At the time of surgery this patient's most appropriate activity status/level should be described as: []     0    Normal activity, no symptoms [x]     1    Restricted in physical strenuous activity but ambulatory, able to do out light work []     2    Ambulatory and capable of self care, unable to do work activities, up and about >50 % of waking hours                              []     3    Only limited self care, in bed greater than  50% of waking hours []     4    Completely disabled, no self care, confined to bed or chair []     5    Moribund   Past Medical History:  Diagnosis Date   Arthritis    right shoulder   COPD (chronic obstructive pulmonary disease) ([employer])    Diabetes mellitus without complication ([place])    Hypertension    Right foot drop    from pinched nerve in back, wears brace    Past Surgical History:  Procedure Laterality Date   CATARACT EXTRACTION Left    CATARACT EXTRACTION W/PHACO Right 08/13/2020   Procedure: CATARACT EXTRACTION PHACO AND INTRAOCULAR LENS PLACEMENT ([place]);  Surgeon: Baruch Goldmann, MD;  Location: [facility];  Service: Ophthalmology;  Laterality: Right;  CDE: 8.24   COLONOSCOPY  12/2020   Dr. Adelina Mings (general surgeon): diverticulsos and left sided colitis with erythema in the sigmoid and descending colon. bx: focal mild acute colitis ?infection vs ischemia   COLONOSCOPY  10/2009   Dr. Carlyon Prows [place]: diverticulosis, two tubular adenomas removed. descending colon erythema and mild  ulcerations noted with benign biopsies.   HERNIA REPAIR     REVERSE SHOULDER ARTHROPLASTY Right 09/08/2020   Procedure: REVERSE TOTAL SHOULDER ARTHROPLASTY;  Surgeon: Hiram Gash, MD;  Location: [facility];  Service: Orthopedics;  Laterality: Right;   screws and steel plate in neck  (F5-7)     10-12 years ago  x2    Family History  Problem Relation Age of Onset   Crohn's disease Cousin    Colon cancer Neg Hx     Social History Social History   Tobacco Use   Smoking status: Every Day    Packs/day: 2.00    Years: 50.00    Pack years: 100.00    Types: Cigarettes    Start date: 1973   Smokeless tobacco: Never   Tobacco comments:    Currently smoking 3-4 cigs per day as of 03/23/21 ep  Vaping Use   Vaping Use: Never used  Substance Use Topics   Alcohol use: Not Currently   Drug use: Not Currently    Current Outpatient Medications  Medication Sig Dispense Refill    albuterol (VENTOLIN HFA) 108 (90 Base) MCG/ACT inhaler Inhale 2 puffs into the lungs every 6 (six) hours as needed for wheezing or shortness of breath.     aspirin 81 MG EC tablet Take by mouth.     lisinopril (ZESTRIL) 10 MG tablet Take 10 mg by mouth daily.     metFORMIN (GLUCOPHAGE) 500 MG tablet Take 1,000 mg by mouth daily with breakfast.     Multiple Vitamins-Minerals (CENTRUM SILVER 50+MEN) TABS Take by mouth.     TRELEGY ELLIPTA 100-62.5-25 MCG/INH AEPB Inhale 1 puff into the lungs daily.     [START ON 05/19/2021] varenicline (CHANTIX CONTINUING MONTH PAK) 1 MG tablet Take 1 tablet (1 mg total) by mouth 2 (two) times daily. 60 tablet 1   varenicline (CHANTIX PAK) 0.5 MG X 11 & 1 MG X 42 tablet Take one 0.5 mg tablet by mouth once daily for 3 days, then increase to one 0.5 mg tablet twice daily for 4 days, then increase to one 1 mg tablet twice daily. 53 tablet 0   No current facility-administered medications for this visit.    No Known Allergies  Review of Systems  Constitutional:  Negative for activity change, appetite change and unexpected weight change.  HENT:  Negative for trouble swallowing and voice change.   Respiratory:  Positive for shortness of breath.   Cardiovascular:  Negative for chest pain.       Left leg claudication  Gastrointestinal:  Negative for abdominal pain.  Genitourinary:  Negative for dysuria.  Musculoskeletal:  Negative for arthralgias and back pain.  Neurological:  Negative for seizures, weakness and numbness.  Hematological:  Negative for adenopathy. Does not bruise/bleed easily.   Ht 6\' 1"  (1.854 m)   Wt 195 lb (88.5 kg)   BMI 25.73 kg/m  Physical Exam Vitals reviewed.  Constitutional:      General: He is not in acute distress.    Appearance: Normal appearance.  HENT:     Head: Normocephalic and atraumatic.  Eyes:     General: No scleral icterus.    Extraocular Movements: Extraocular movements intact.  Neck:     Vascular: No carotid bruit.   Cardiovascular:     Rate and Rhythm: Normal rate and regular rhythm.     Heart sounds: Normal heart sounds. No murmur heard.   No friction rub. No gallop.  Pulmonary:  Effort: Pulmonary effort is normal. No respiratory distress.     Breath sounds: Normal breath sounds. No wheezing or rales.  Abdominal:     General: There is no distension.     Palpations: Abdomen is soft.     Tenderness: There is no abdominal tenderness.  Lymphadenopathy:     Cervical: No cervical adenopathy.  Skin:    General: Skin is warm and dry.  Neurological:     General: No focal deficit present.     Mental Status: He is alert and oriented to person, place, and time.     Cranial Nerves: No cranial nerve deficit.  No palpable DP or PT pulses bilaterally   Diagnostic Tests: NUCLEAR MEDICINE PET SKULL BASE TO THIGH   TECHNIQUE: 9.41 mCi F-18 FDG was injected intravenously. Full-ring PET imaging was performed from the skull base to thigh after the radiotracer. CT data was obtained and used for attenuation correction and anatomic localization.   Fasting blood glucose: 129 mg/dl   COMPARISON:  March 11, 2021.   FINDINGS: Mediastinal blood pool activity: SUV max 2.23   Liver activity: SUV max NA   NECK: No hypermetabolic lymph nodes in the neck.   Incidental CT findings: none   CHEST: The cystic area in the RIGHT lower lobe with irregular wall displays markedly increased metabolic activity accounting for the limited amount of "soft tissue" in the area at maximum SUV of 6.48 (image 36/8 and image 76/4w). Lesion abuts the major fissure. No additional hypermetabolic foci are present in the chest.   Incidental CT findings: Signs of pulmonary emphysema and RIGHT middle lobe scarring as on the previous chest CT. Aortic atherosclerosis and coronary artery disease. No substantial pericardial effusion.   ABDOMEN/PELVIS: No abnormal hypermetabolic activity within the liver, pancreas, adrenal  glands, or spleen. No hypermetabolic lymph nodes in the abdomen or pelvis. Nonspecific foci of uptake in the LEFT gluteal fold and in the RIGHT inguinal crease. Area in the RIGHT inguinal crease appears deep to the subcutaneous fat with a maximum SUV of 5.6. (Image 213/4) mild skin thickening in this area.   Soft tissue nodule in the LEFT gluteal crease (image 221/4) this measures 10 mm and displays a maximum SUV of 7.3.   Suggestion of thickening also along the LEFT hemiscrotum with a maximum SUV of 5.6, scrotal wall at 8 mm thickness at this level (image 225/4   Incidental CT findings: Liver with smooth contour. Cholelithiasis. No acute finding relative to pancreas, spleen, adrenal glands, kidneys, urinary bladder, stomach, small or large bowel. Colonic diverticulosis and diverticular changes of the sigmoid. Moderate stool in the colon. Appendix is normal. Aortic atherosclerosis with 2.9 x 3.0 cm mild aneurysmal dilation of the infrarenal abdominal aorta.   SKELETON: No focal hypermetabolic activity to suggest skeletal metastasis.   Incidental CT findings: No acute bone finding. No destructive bone process. Spinal degenerative changes. Signs of RIGHT shoulder arthroplasty and cervical ACDF.   IMPRESSION: Increased metabolic activity associated with the cavity in the superior segment of the RIGHT lower lobe abutting the major fissure and displaying mildly irregular wall thickening, concerning for primary bronchogenic neoplasm.   Nodularity in the LEFT gluteal fold with increased metabolic activity, raising the question of cutaneous neoplasm. Direct clinical inspection is suggested, also with increased metabolic activity in the RIGHT inguinal crease with suggestion of mild skin thickening in this location. Would also correlate with any signs of inflammation in these locations.   Skin thickening about the LEFT scrotum with increased  metabolic activity as well, nonspecific  either neoplastic or inflammatory. Correlate with direct clinical inspection and with sonographic assessment as warranted.   3 cm infrarenal abdominal aortic aneurysm. Recommend follow-up every 3 years.   Reference: J Am Coll Radiol 3785;88:502-774.   Aortic Atherosclerosis (ICD10-I70.0) and Emphysema (ICD10-J43.9).   Aortic aneurysm NOS (ICD10-I71.9).     Electronically Signed   By: Zetta Bills M.D.   On: 04/04/2021 10:34   I personally reviewed the CT images.  There is a cavitary lesion in the superior segment of the right lower lobe.  There is variable wall thickness.  There is significant hypermetabolic uptake for the amount of soft tissue with this lesion.  No hilar or mediastinal adenopathy.  No evidence of distant metastatic disease. There is a 4.1 cm ascending aneurysm and a 3 cm infrarenal AAA.  There is thoracic aortic atherosclerosis and coronary atherosclerosis  Pulmonary function testing 04/06/2021 FVC 3.04 (61%) FEV1 1.49 (40%) No significant change with bronchodilator DLCO 18.81 (66%)  Impression: Ryatt Corsino is a 65 year old man with a history of tobacco abuse, COPD, type 2 diabetes, hypertension, arthritis, and right foot drop.  He recently had a low-dose CT for lung cancer screening.  It showed a significant decrease in size and wall thickness of a cystic lesion in the superior segment of the right lower lobe.  On PET/CT the nodule was hypermetabolic with SUV of 6.8.  There was no evidence of regional or distant metastatic disease.  I reviewed the PET/CT images with Mr. Mikami.  We discussed the differential diagnosis.  This mass is almost certainly a new primary bronchogenic carcinoma.  Infectious and inflammatory nodules are in the differential but are far less likely.  We could attempt a biopsy but if the biopsy was negative I would not trust it would recommend surgical resection anyway.  My recommendation was that we proceed with robotic right VATS for  right lower lobectomy for definitive diagnosis and treatment of the lung mass.  I informed him of the general nature of the procedure including the need for general anesthesia, the incisions to be used, use of the surgical robot, the use of drainage tube postoperatively, the expected hospital stay, and the overall recovery with Mr. Senseney.  He understands the amount of pain is variable from patient to patient.  I informed him of the indications, risks, benefits, and alternatives.  He understands the risks include, but not limited to death, MI, DVT, PE, bleeding, possible need for transfusion, infection, prolonged air leak, cardiac arrhythmias, as well as possibility of other unforeseeable complications.  He does have adequate pulmonary function to tolerate resection, but will have some significant limitations postoperatively.  So likely will need to be on long-term.  He has evidence of thoracic aortic and coronary calcification consistent with atherosclerosis.  He probably would benefit from statin therapy.  I will defer that to his primary and can be addressed after we take care of the lung cancer.  He is not having anginal symptoms so he is at moderate risk for cardiac complications with noncardiac thoracic surgery.  Tobacco abuse-he continues to smoke although he has cut back significantly.  He does express a desire to quit at feels like Chantix is helping.  I again emphasized the importance of complete cessation.  Ascending aortic aneurysm- 4.1 cm, no indication for surgery. BP control important.  Plan: Robotic right VATS for right lower lobectomy on Friday, 05/20/2021  Melrose Nakayama, MD [facility] [phone]

## 2021-05-12 NOTE — H&P (View-Only) (Signed)
PCP is [name], PA-C Referring Provider is Maryjane Hurter, MD  Chief Complaint  Patient presents with   Lung Cancer    Surgical consult, PET Scan 04/04/21, PFT's 04/06/21, Chest CT 03/14/21    HPI: Stephen Diaz is sent for consultation regarding a cavitary lung mass in the right lower lobe.  Stephen Diaz is a 65 year old man with a history of tobacco abuse, COPD, type 2 diabetes, hypertension, arthritis, and right foot drop.  Stephen Diaz recently had a low-dose CT for lung cancer screening.  It showed a cavitary lesion in the right lower lobe had increased in size significantly compared to the scan from a year prior.  It initially was 1.5 cm and thin-walled.  It now measures 2.8 cm and there was variable wall thickness up to 4 mm in some areas.  There was some borderline adenopathy.  Stephen Diaz was referred to Dr. Verlee Monte.  A PET/CT showed this nodule was intensely hypermetabolic with an SUV greater than 6.  Stephen Diaz smoked about a pack and a half of cigarettes daily for over 40 years.  Stephen Diaz recently has cut back to 3 to 4 cigarettes a day.  Stephen Diaz feels like the Chantix is helping.  Stephen Diaz gets short of breath when Stephen Diaz carries things like his fishing gear.  Stephen Diaz denies any chest pain, pressure, or tightness.  Uses his rescue inhaler a couple of times a week.  Stephen Diaz can walk up more than 2 flights of stairs.  No change in appetite or weight loss.  No unusual headaches or visual changes.  Stephen Diaz can walk about three fourths of a mile before having to stop due to cramping in his left calf.  Zubrod Score: At the time of surgery this patient's most appropriate activity status/level should be described as: []     0    Normal activity, no symptoms [x]     1    Restricted in physical strenuous activity but ambulatory, able to do out light work []     2    Ambulatory and capable of self care, unable to do work activities, up and about >50 % of waking hours                              []     3    Only limited self care, in bed greater than  50% of waking hours []     4    Completely disabled, no self care, confined to bed or chair []     5    Moribund   Past Medical History:  Diagnosis Date   Arthritis    right shoulder   COPD (chronic obstructive pulmonary disease) ([place])    Diabetes mellitus without complication ([place])    Hypertension    Right foot drop    from pinched nerve in back, wears brace    Past Surgical History:  Procedure Laterality Date   CATARACT EXTRACTION Left    CATARACT EXTRACTION W/PHACO Right 08/13/2020   Procedure: CATARACT EXTRACTION PHACO AND INTRAOCULAR LENS PLACEMENT ([place]);  Surgeon: Baruch Goldmann, MD;  Location: [facility];  Service: Ophthalmology;  Laterality: Right;  CDE: 8.24   COLONOSCOPY  12/2020   Dr. Adelina Mings (general surgeon): diverticulsos and left sided colitis with erythema in the sigmoid and descending colon. bx: focal mild acute colitis ?infection vs ischemia   COLONOSCOPY  10/2009   Dr. Carlyon Prows [place]: diverticulosis, two tubular adenomas removed. descending colon erythema and mild  ulcerations noted with benign biopsies.   HERNIA REPAIR     REVERSE SHOULDER ARTHROPLASTY Right 09/08/2020   Procedure: REVERSE TOTAL SHOULDER ARTHROPLASTY;  Surgeon: Hiram Gash, MD;  Location: [facility];  Service: Orthopedics;  Laterality: Right;   screws and steel plate in neck  (Q6-8)     10-12 years ago  x2    Family History  Problem Relation Age of Onset   Crohn's disease Cousin    Colon cancer Neg Hx     Social History Social History   Tobacco Use   Smoking status: Every Day    Packs/day: 2.00    Years: 50.00    Pack years: 100.00    Types: Cigarettes    Start date: 1973   Smokeless tobacco: Never   Tobacco comments:    Currently smoking 3-4 cigs per day as of 03/23/21 ep  Vaping Use   Vaping Use: Never used  Substance Use Topics   Alcohol use: Not Currently   Drug use: Not Currently    Current Outpatient Medications  Medication Sig Dispense Refill    albuterol (VENTOLIN HFA) 108 (90 Base) MCG/ACT inhaler Inhale 2 puffs into the lungs every 6 (six) hours as needed for wheezing or shortness of breath.     aspirin 81 MG EC tablet Take by mouth.     lisinopril (ZESTRIL) 10 MG tablet Take 10 mg by mouth daily.     metFORMIN (GLUCOPHAGE) 500 MG tablet Take 1,000 mg by mouth daily with breakfast.     Multiple Vitamins-Minerals (CENTRUM SILVER 50+MEN) TABS Take by mouth.     TRELEGY ELLIPTA 100-62.5-25 MCG/INH AEPB Inhale 1 puff into the lungs daily.     [START ON 05/19/2021] varenicline (CHANTIX CONTINUING MONTH PAK) 1 MG tablet Take 1 tablet (1 mg total) by mouth 2 (two) times daily. 60 tablet 1   varenicline (CHANTIX PAK) 0.5 MG X 11 & 1 MG X 42 tablet Take one 0.5 mg tablet by mouth once daily for 3 days, then increase to one 0.5 mg tablet twice daily for 4 days, then increase to one 1 mg tablet twice daily. 53 tablet 0   No current facility-administered medications for this visit.    No Known Allergies  Review of Systems  Constitutional:  Negative for activity change, appetite change and unexpected weight change.  HENT:  Negative for trouble swallowing and voice change.   Respiratory:  Positive for shortness of breath.   Cardiovascular:  Negative for chest pain.       Left leg claudication  Gastrointestinal:  Negative for abdominal pain.  Genitourinary:  Negative for dysuria.  Musculoskeletal:  Negative for arthralgias and back pain.  Neurological:  Negative for seizures, weakness and numbness.  Hematological:  Negative for adenopathy. Does not bruise/bleed easily.   Ht 6\' 1"  (1.854 m)   Wt 195 lb (88.5 kg)   BMI 25.73 kg/m  Physical Exam Vitals reviewed.  Constitutional:      General: Stephen Diaz is not in acute distress.    Appearance: Normal appearance.  HENT:     Head: Normocephalic and atraumatic.  Eyes:     General: No scleral icterus.    Extraocular Movements: Extraocular movements intact.  Neck:     Vascular: No carotid bruit.   Cardiovascular:     Rate and Rhythm: Normal rate and regular rhythm.     Heart sounds: Normal heart sounds. No murmur heard.   No friction rub. No gallop.  Pulmonary:  Effort: Pulmonary effort is normal. No respiratory distress.     Breath sounds: Normal breath sounds. No wheezing or rales.  Abdominal:     General: There is no distension.     Palpations: Abdomen is soft.     Tenderness: There is no abdominal tenderness.  Lymphadenopathy:     Cervical: No cervical adenopathy.  Skin:    General: Skin is warm and dry.  Neurological:     General: No focal deficit present.     Mental Status: Stephen Diaz is alert and oriented to person, place, and time.     Cranial Nerves: No cranial nerve deficit.  No palpable DP or PT pulses bilaterally   Diagnostic Tests: NUCLEAR MEDICINE PET SKULL BASE TO THIGH   TECHNIQUE: 9.41 mCi F-18 FDG was injected intravenously. Full-ring PET imaging was performed from the skull base to thigh after the radiotracer. CT data was obtained and used for attenuation correction and anatomic localization.   Fasting blood glucose: 129 mg/dl   COMPARISON:  March 11, 2021.   FINDINGS: Mediastinal blood pool activity: SUV max 2.23   Liver activity: SUV max NA   NECK: No hypermetabolic lymph nodes in the neck.   Incidental CT findings: none   CHEST: The cystic area in the RIGHT lower lobe with irregular wall displays markedly increased metabolic activity accounting for the limited amount of "soft tissue" in the area at maximum SUV of 6.48 (image 36/8 and image 76/4w). Lesion abuts the major fissure. No additional hypermetabolic foci are present in the chest.   Incidental CT findings: Signs of pulmonary emphysema and RIGHT middle lobe scarring as on the previous chest CT. Aortic atherosclerosis and coronary artery disease. No substantial pericardial effusion.   ABDOMEN/PELVIS: No abnormal hypermetabolic activity within the liver, pancreas, adrenal  glands, or spleen. No hypermetabolic lymph nodes in the abdomen or pelvis. Nonspecific foci of uptake in the LEFT gluteal fold and in the RIGHT inguinal crease. Area in the RIGHT inguinal crease appears deep to the subcutaneous fat with a maximum SUV of 5.6. (Image 213/4) mild skin thickening in this area.   Soft tissue nodule in the LEFT gluteal crease (image 221/4) this measures 10 mm and displays a maximum SUV of 7.3.   Suggestion of thickening also along the LEFT hemiscrotum with a maximum SUV of 5.6, scrotal wall at 8 mm thickness at this level (image 225/4   Incidental CT findings: Liver with smooth contour. Cholelithiasis. No acute finding relative to pancreas, spleen, adrenal glands, kidneys, urinary bladder, stomach, small or large bowel. Colonic diverticulosis and diverticular changes of the sigmoid. Moderate stool in the colon. Appendix is normal. Aortic atherosclerosis with 2.9 x 3.0 cm mild aneurysmal dilation of the infrarenal abdominal aorta.   SKELETON: No focal hypermetabolic activity to suggest skeletal metastasis.   Incidental CT findings: No acute bone finding. No destructive bone process. Spinal degenerative changes. Signs of RIGHT shoulder arthroplasty and cervical ACDF.   IMPRESSION: Increased metabolic activity associated with the cavity in the superior segment of the RIGHT lower lobe abutting the major fissure and displaying mildly irregular wall thickening, concerning for primary bronchogenic neoplasm.   Nodularity in the LEFT gluteal fold with increased metabolic activity, raising the question of cutaneous neoplasm. Direct clinical inspection is suggested, also with increased metabolic activity in the RIGHT inguinal crease with suggestion of mild skin thickening in this location. Would also correlate with any signs of inflammation in these locations.   Skin thickening about the LEFT scrotum with increased  metabolic activity as well, nonspecific  either neoplastic or inflammatory. Correlate with direct clinical inspection and with sonographic assessment as warranted.   3 cm infrarenal abdominal aortic aneurysm. Recommend follow-up every 3 years.   Reference: J Am Coll Radiol 8315;17:[id number].   Aortic Atherosclerosis (ICD10-I70.0) and Emphysema (ICD10-J43.9).   Aortic aneurysm NOS (ICD10-I71.9).     Electronically Signed   By: Zetta Bills M.D.   On: 04/04/2021 10:34   I personally reviewed the CT images.  There is a cavitary lesion in the superior segment of the right lower lobe.  There is variable wall thickness.  There is significant hypermetabolic uptake for the amount of soft tissue with this lesion.  No hilar or mediastinal adenopathy.  No evidence of distant metastatic disease. There is a 4.1 cm ascending aneurysm and a 3 cm infrarenal AAA.  There is thoracic aortic atherosclerosis and coronary atherosclerosis  Pulmonary function testing 04/06/2021 FVC 3.04 (61%) FEV1 1.49 (40%) No significant change with bronchodilator DLCO 18.81 (66%)  Impression: Stephen Diaz is a 89 year old man with a history of tobacco abuse, COPD, type 2 diabetes, hypertension, arthritis, and right foot drop.  Stephen Diaz recently had a low-dose CT for lung cancer screening.  It showed a significant decrease in size and wall thickness of a cystic lesion in the superior segment of the right lower lobe.  On PET/CT the nodule was hypermetabolic with SUV of 6.8.  There was no evidence of regional or distant metastatic disease.  I reviewed the PET/CT images with Stephen Diaz.  We discussed the differential diagnosis.  This mass is almost certainly a new primary bronchogenic carcinoma.  Infectious and inflammatory nodules are in the differential but are far less likely.  We could attempt a biopsy but if the biopsy was negative I would not trust it would recommend surgical resection anyway.  My recommendation was that we proceed with robotic right VATS for  right lower lobectomy for definitive diagnosis and treatment of the lung mass.  I informed him of the general nature of the procedure including the need for general anesthesia, the incisions to be used, use of the surgical robot, the use of drainage tube postoperatively, the expected hospital stay, and the overall recovery with Stephen Diaz.  Stephen Diaz understands the amount of pain is variable from patient to patient.  I informed him of the indications, risks, benefits, and alternatives.  Stephen Diaz understands the risks include, but not limited to death, MI, DVT, PE, bleeding, possible need for transfusion, infection, prolonged air leak, cardiac arrhythmias, as well as possibility of other unforeseeable complications.  Stephen Diaz does have adequate pulmonary function to tolerate resection, but will have some significant limitations postoperatively.  So likely will need to be on long-term.  Stephen Diaz has evidence of thoracic aortic and coronary calcification consistent with atherosclerosis.  Stephen Diaz probably would benefit from statin therapy.  I will defer that to his primary and can be addressed after we take care of the lung cancer.  Stephen Diaz is not having anginal symptoms so Stephen Diaz is at moderate risk for cardiac complications with noncardiac thoracic surgery.  Tobacco abuse-Stephen Diaz continues to smoke although Stephen Diaz has cut back significantly.  Stephen Diaz does express a desire to quit at feels like Chantix is helping.  I again emphasized the importance of complete cessation.  Ascending aortic aneurysm- 4.1 cm, no indication for surgery. BP control important.  Plan: Robotic right VATS for right lower lobectomy on Friday, 05/20/2021  Melrose Nakayama, MD [facility] [phone]

## 2021-05-15 ENCOUNTER — Other Ambulatory Visit: Payer: Self-pay | Admitting: Student

## 2021-05-17 NOTE — Progress Notes (Signed)
Surgical Instructions    Your procedure is scheduled on December 9th Friday.  Report to [facility] Main Entrance "A" at 10:15 A.M., then check in with the Admitting office.  Call this number if you have problems the morning of surgery:  [phone]   If you have any questions prior to your surgery date call [phone]: Open Monday-Friday 8am-4pm    Remember:  Do not eat or drink after midnight the night before your surgery     Take these medicines the morning of surgery with A SIP OF WATER TRELEGY ELLIPTA 100-62.5-25 MCG/INH AEPB - please bring with you to the hospital    IF NEEDED albuterol (VENTOLIN HFA) 108 (90 Base) MCG/ACT inhaler - please bring with you to the hospital    Follow your surgeon's instructions on when to stop Aspirin.  If no instructions were given by your surgeon then you will need to call the office to get those instructions.     As of today, STOP taking any Aspirin (unless otherwise instructed by your surgeon) Aleve, Naproxen, Ibuprofen, Motrin, Advil, Goody's, BC's, all herbal medications, fish oil, and all vitamins.   WHAT DO I DO ABOUT MY DIABETES MEDICATION?   Do not take oral diabetes medicines (Metformin) the morning of surgery.    HOW TO MANAGE YOUR DIABETES BEFORE AND AFTER SURGERY  Why is it important to control my blood sugar before and after surgery? Improving blood sugar levels before and after surgery helps healing and can limit problems. A way of improving blood sugar control is eating a healthy diet by:  Eating less sugar and carbohydrates  Increasing activity/exercise  Talking with your doctor about reaching your blood sugar goals High blood sugars (greater than 180 mg/dL) can raise your risk of infections and slow your recovery, so you will need to focus on controlling your diabetes during the weeks before surgery. Make sure that the doctor who takes care of your diabetes knows about your planned surgery including the date and  location.  How do I manage my blood sugar before surgery? Check your blood sugar at least 4 times a day, starting 2 days before surgery, to make sure that the level is not too high or low.  Check your blood sugar the morning of your surgery when you wake up and every 2 hours until you get to the Short Stay unit.  If your blood sugar is less than 70 mg/dL, you will need to treat for low blood sugar: Do not take insulin. Treat a low blood sugar (less than 70 mg/dL) with  cup of clear juice (cranberry or apple), 4 glucose tablets, OR glucose gel. Recheck blood sugar in 15 minutes after treatment (to make sure it is greater than 70 mg/dL). If your blood sugar is not greater than 70 mg/dL on recheck, call [phone] for further instructions. Report your blood sugar to the short stay nurse when you get to Short Stay.  If you are admitted to the hospital after surgery: Your blood sugar will be checked by the staff and you will probably be given insulin after surgery (instead of oral diabetes medicines) to make sure you have good blood sugar levels. The goal for blood sugar control after surgery is 80-180 mg/dL.   After your COVID test   You are not required to quarantine however you are required to wear a well-fitting mask when you are out and around people not in your household.  If your mask becomes wet or soiled, replace with  a new one.  Wash your hands often with soap and water for 20 seconds or clean your hands with an alcohol-based hand sanitizer that contains at least 60% alcohol.  Do not share personal items.  Notify your provider: if you are in close contact with someone who has COVID  or if you develop a fever of 100.4 or greater, sneezing, cough, sore throat, shortness of breath or body aches.             Do not wear jewelry  Do not wear lotions, powders, colognes, or deodorant. Do not shave 48 hours prior to surgery.  Men may shave face and neck. Do not bring valuables to  the hospital. DO Not wear nail polish, gel polish, artificial nails, or any other type of covering on natural nails including finger and toenails. If patients have artificial nails, gel coating, etc. that need to be removed by a nail salon, please have this removed prior to surgery or surgery may need to be canceled/delayed if the surgeon/ anesthesia feels like the patient is unable to be adequately monitored.             [facility] is not responsible for any belongings or valuables.  Do NOT Smoke (Tobacco/Vaping)  24 hours prior to your procedure  If you use a CPAP at night, you may bring your mask for your overnight stay.   Contacts, glasses, hearing aids, dentures or partials may not be worn into surgery, please bring cases for these belongings   For patients admitted to the hospital, discharge time will be determined by your treatment team.   Patients discharged the day of surgery will not be allowed to drive home, and someone needs to stay with them for 24 hours.  NO VISITORS WILL BE ALLOWED IN PRE-OP WHERE PATIENTS ARE PREPPED FOR SURGERY.  ONLY 1 SUPPORT PERSON MAY BE PRESENT IN THE WAITING ROOM WHILE YOU ARE IN SURGERY.  IF YOU ARE TO BE ADMITTED, ONCE YOU ARE IN YOUR ROOM YOU WILL BE ALLOWED TWO (2) VISITORS. 1 (ONE) VISITOR MAY STAY OVERNIGHT BUT MUST ARRIVE TO THE ROOM BY 8pm.  Minor children may have two parents present. Special consideration for safety and communication needs will be reviewed on a case by case basis.  Special instructions:    Oral Hygiene is also important to reduce your risk of infection.  Remember - BRUSH YOUR TEETH THE MORNING OF SURGERY WITH YOUR REGULAR TOOTHPASTE   [facility]- Preparing For Surgery  Before surgery, you can play an important role. Because skin is not sterile, your skin needs to be as free of germs as possible. You can reduce the number of germs on your skin by washing with CHG (chlorahexidine gluconate) Soap before surgery.  CHG is an  antiseptic cleaner which kills germs and bonds with the skin to continue killing germs even after washing.     Please do not use if you have an allergy to CHG or antibacterial soaps. If your skin becomes reddened/irritated stop using the CHG.  Do not shave (including legs and underarms) for at least 48 hours prior to first CHG shower. It is OK to shave your face.  Please follow these instructions carefully.     Shower the NIGHT BEFORE SURGERY and the MORNING OF SURGERY with CHG Soap.   If you chose to wash your hair, wash your hair first as usual with your normal shampoo. After you shampoo, rinse your hair and body thoroughly to remove the  shampoo.  Then [employer] and genitals (private parts) with your normal soap and rinse thoroughly to remove soap.  After that Use CHG Soap as you would any other liquid soap. You can apply CHG directly to the skin and wash gently with a scrungie or a clean washcloth.   Apply the CHG Soap to your body ONLY FROM THE NECK DOWN.  Do not use on open wounds or open sores. Avoid contact with your eyes, ears, mouth and genitals (private parts). Wash Face and genitals (private parts)  with your normal soap.   Wash thoroughly, paying special attention to the area where your surgery will be performed.  Thoroughly rinse your body with warm water from the neck down.  DO NOT shower/wash with your normal soap after using and rinsing off the CHG Soap.  Pat yourself dry with a CLEAN TOWEL.  Wear CLEAN PAJAMAS to bed the night before surgery  Place CLEAN SHEETS on your bed the night before your surgery  DO NOT SLEEP WITH PETS.   Day of Surgery:  Take a shower with CHG soap. Wear Clean/Comfortable clothing the morning of surgery Do not apply any deodorants/lotions.   Remember to brush your teeth WITH YOUR REGULAR TOOTHPASTE.   Please read over the following fact sheets that you were given.

## 2021-05-18 ENCOUNTER — Other Ambulatory Visit: Payer: Self-pay

## 2021-05-18 ENCOUNTER — Ambulatory Visit (HOSPITAL_COMMUNITY)
Admission: RE | Admit: 2021-05-18 | Discharge: 2021-05-18 | Disposition: A | Discharge status: Home / Self Care | Payer: Medicare Other | Source: Ambulatory Visit | Attending: Thoracic Surgery (Cardiothoracic Vascular Surgery) | Admitting: Thoracic Surgery (Cardiothoracic Vascular Surgery)

## 2021-05-18 ENCOUNTER — Encounter (HOSPITAL_COMMUNITY): Payer: Self-pay

## 2021-05-18 ENCOUNTER — Encounter (HOSPITAL_COMMUNITY)
Admission: RE | Admit: 2021-05-18 | Discharge: 2021-05-18 | Disposition: A | Discharge status: Home / Self Care | Payer: Medicare Other | Source: Ambulatory Visit | Attending: Thoracic Surgery (Cardiothoracic Vascular Surgery) | Admitting: Thoracic Surgery (Cardiothoracic Vascular Surgery)

## 2021-05-18 VITALS — BP 132/81 | HR 89 | Temp 98.0°F | Resp 18 | Ht 73.0 in | Wt 200.7 lb

## 2021-05-18 DIAGNOSIS — Z20822 Contact with and (suspected) exposure to covid-19: Secondary | ICD-10-CM | POA: Insufficient documentation

## 2021-05-18 DIAGNOSIS — Z5181 Encounter for therapeutic drug level monitoring: Secondary | ICD-10-CM | POA: Insufficient documentation

## 2021-05-18 DIAGNOSIS — E119 Type 2 diabetes mellitus without complications: Secondary | ICD-10-CM

## 2021-05-18 DIAGNOSIS — C3431 Malignant neoplasm of lower lobe, right bronchus or lung: Secondary | ICD-10-CM | POA: Insufficient documentation

## 2021-05-18 DIAGNOSIS — Z01818 Encounter for other preprocedural examination: Secondary | ICD-10-CM | POA: Insufficient documentation

## 2021-05-18 LAB — CBC
HCT: 47.4 % (ref 39.0–52.0)
Hemoglobin: 15.6 g/dL (ref 13.0–17.0)
MCH: 28.8 pg (ref 26.0–34.0)
MCHC: 32.9 g/dL (ref 30.0–36.0)
MCV: 87.5 fL (ref 80.0–100.0)
Platelets: 363 10*3/uL (ref 150–400)
RBC: 5.42 MIL/uL (ref 4.22–5.81)
RDW: 13.2 % (ref 11.5–15.5)
WBC: 13 10*3/uL — ABNORMAL HIGH (ref 4.0–10.5)
nRBC: 0 % (ref 0.0–0.2)

## 2021-05-18 LAB — HEMOGLOBIN A1C
Hgb A1c MFr Bld: 6.2 % — ABNORMAL HIGH (ref 4.8–5.6)
Mean Plasma Glucose: 131.24 mg/dL

## 2021-05-18 LAB — BLOOD GAS, ARTERIAL
Acid-base deficit: 0.2 mmol/L (ref 0.0–2.0)
Bicarbonate: 23.9 mmol/L (ref 20.0–28.0)
FIO2: 21
O2 Saturation: 96.8 %
Patient temperature: 37
pCO2 arterial: 38.9 mmHg (ref 32.0–48.0)
pH, Arterial: 7.405 (ref 7.350–7.450)
pO2, Arterial: 82.8 mmHg — ABNORMAL LOW (ref 83.0–108.0)

## 2021-05-18 LAB — URINALYSIS, ROUTINE W REFLEX MICROSCOPIC
Bilirubin Urine: NEGATIVE
Glucose, UA: NEGATIVE mg/dL
Hgb urine dipstick: NEGATIVE
Ketones, ur: NEGATIVE mg/dL
Leukocytes,Ua: NEGATIVE
Nitrite: NEGATIVE
Protein, ur: NEGATIVE mg/dL
Specific Gravity, Urine: 1.015 (ref 1.005–1.030)
pH: 5.5 (ref 5.0–8.0)

## 2021-05-18 LAB — COMPREHENSIVE METABOLIC PANEL
ALT: 21 U/L (ref 0–44)
AST: 25 U/L (ref 15–41)
Albumin: 4.1 g/dL (ref 3.5–5.0)
Alkaline Phosphatase: 74 U/L (ref 38–126)
Anion gap: 11 (ref 5–15)
BUN: 15 mg/dL (ref 8–23)
CO2: 21 mmol/L — ABNORMAL LOW (ref 22–32)
Calcium: 9 mg/dL (ref 8.9–10.3)
Chloride: 100 mmol/L (ref 98–111)
Creatinine, Ser: 0.72 mg/dL (ref 0.61–1.24)
GFR, Estimated: >60 mL/min (ref >60)
Glucose, Bld: 137 mg/dL — ABNORMAL HIGH (ref 70–99)
Potassium: 4.3 mmol/L (ref 3.5–5.1)
Sodium: 132 mmol/L — ABNORMAL LOW (ref 135–145)
Total Bilirubin: 0.2 mg/dL — ABNORMAL LOW (ref 0.3–1.2)
Total Protein: 7.2 g/dL (ref 6.5–8.1)

## 2021-05-18 LAB — TYPE AND SCREEN
ABO/RH(D): O POS
Antibody Screen: NEGATIVE

## 2021-05-18 LAB — SURGICAL PCR SCREEN
MRSA, PCR: NEGATIVE
Staphylococcus aureus: NEGATIVE

## 2021-05-18 LAB — PROTIME-INR
INR: 1.1 (ref 0.8–1.2)
Prothrombin Time: 13.9 seconds (ref 11.4–15.2)

## 2021-05-18 LAB — APTT: aPTT: 28 seconds (ref 24–36)

## 2021-05-18 LAB — GLUCOSE, CAPILLARY: Glucose-Capillary: 162 mg/dL — ABNORMAL HIGH (ref 70–99)

## 2021-05-18 IMAGING — DX DG CHEST 2V
2 series · 2 of 2 positions shown · non-contrast
Comparison: [DATE]

CLINICAL DATA: Preop right lower lobectomy

EXAM:
CHEST - 2 VIEW

[w chest pa]
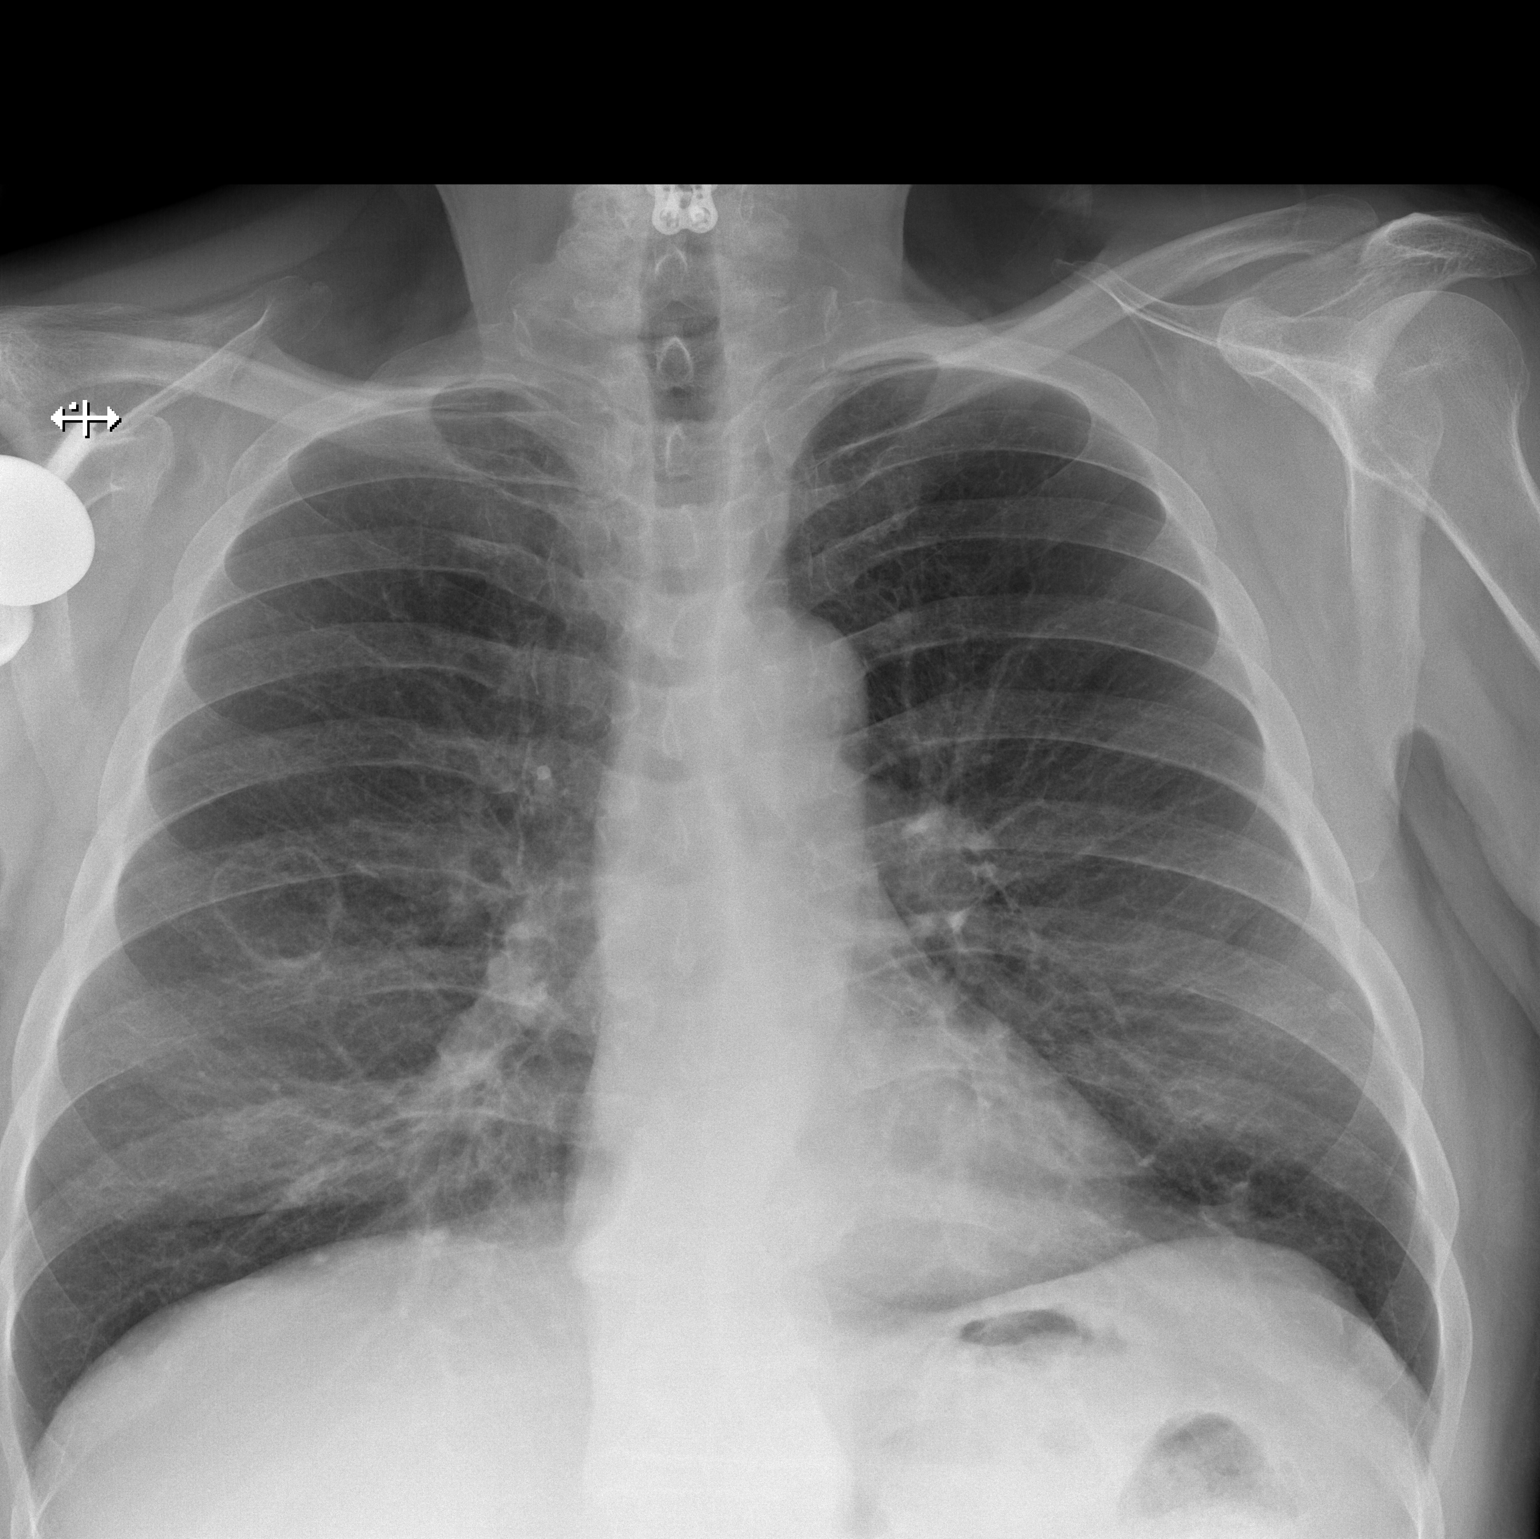

[w chest lat]
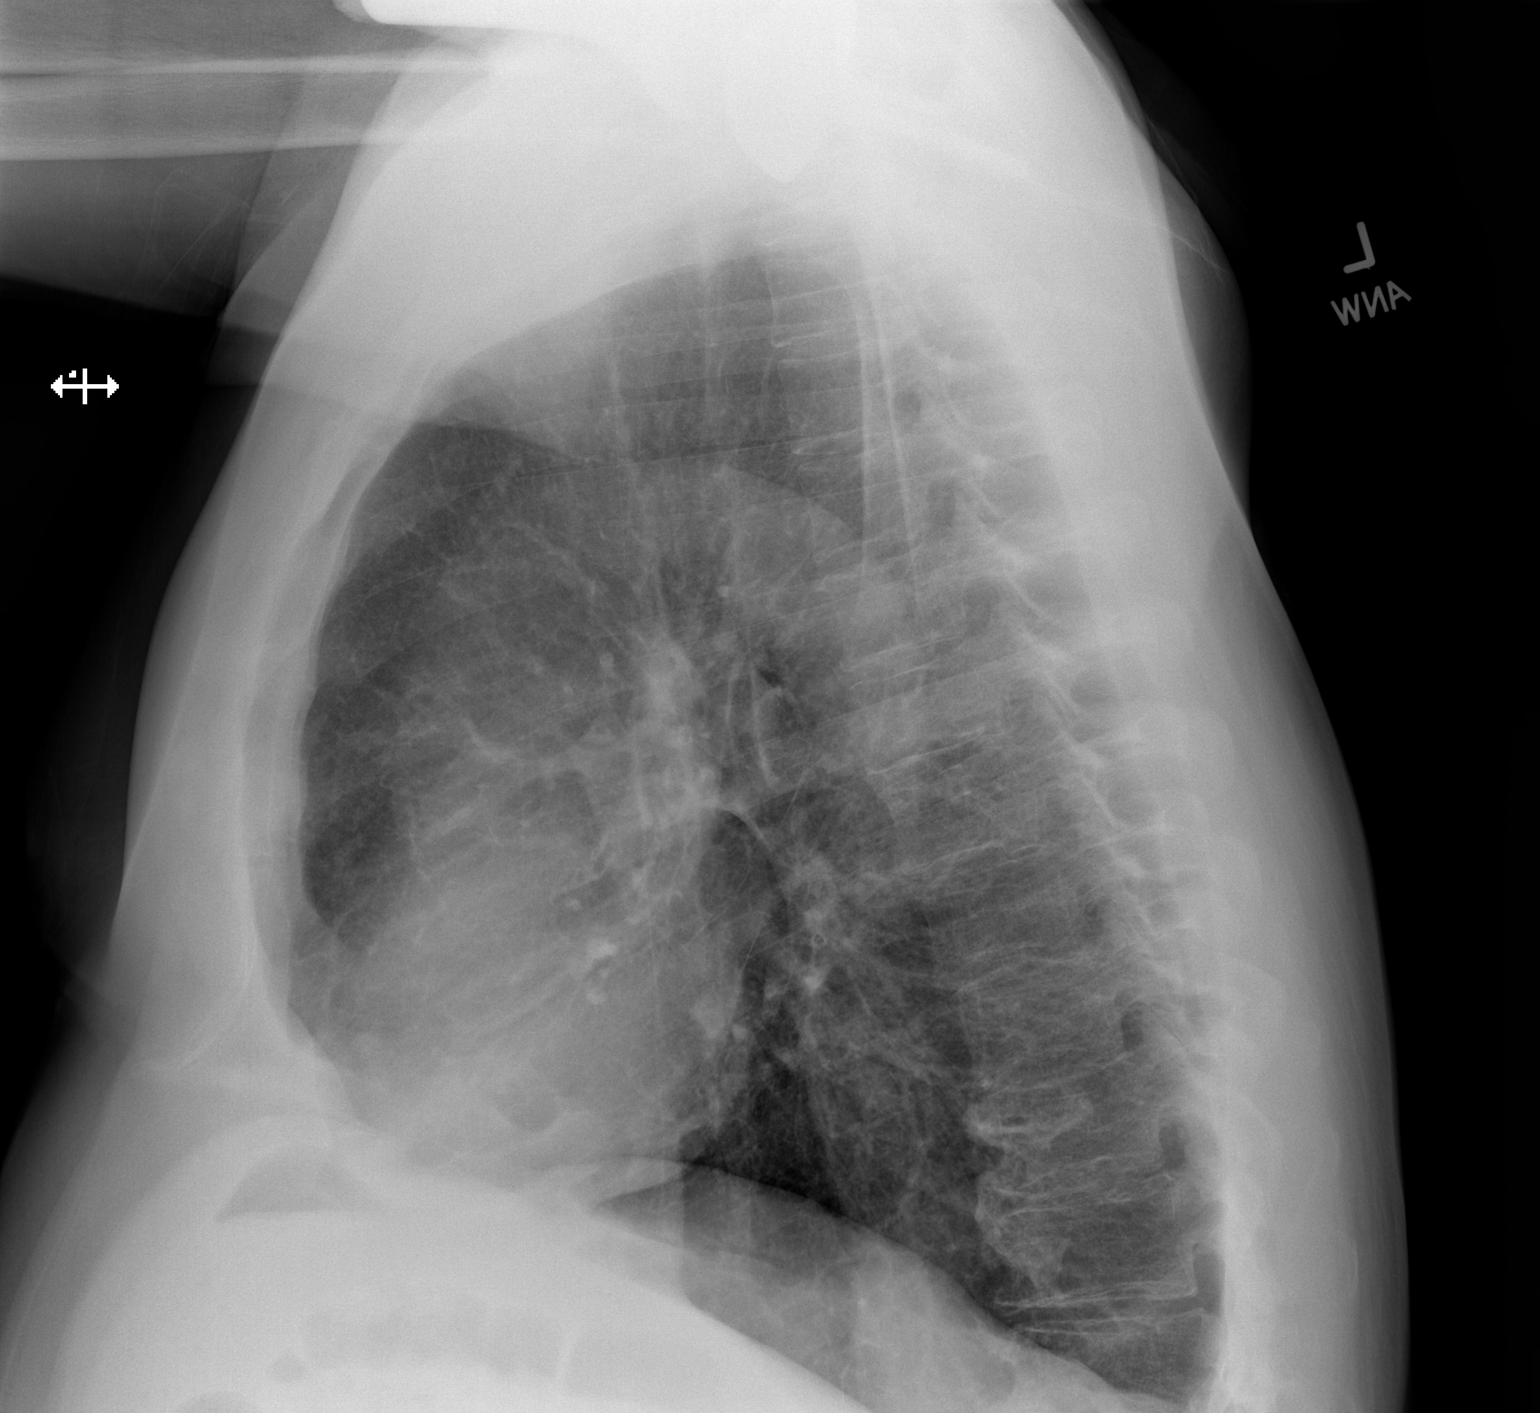

[2 of 2 positions shown; findings below may reference images not displayed]

FINDINGS: Hardware in the right shoulder and cervical spine. No acute
consolidation, pleural effusion or pneumothorax. Cystic lesion in
the right lower lobe measures 3.4 cm, corresponding to the PET CT
abnormality.
IMPRESSION: 1. No radiographic evidence for acute cardiopulmonary abnormality.
2. Cystic lesion in the right lower lobe corresponding to the CT
lesion.

## 2021-05-18 NOTE — Progress Notes (Signed)
PCP - PA A. Boles  Cardiologist - Denies  EP-Denies  Endocrine-Denies  Pulm-Denies  Chest x-ray - 05/18/21  EKG - 05/18/21  Stress Test - Denies  ECHO - Denies  Cardiac Cath - Denies  AICD-na PM-na LOOP-na  Dialysis-Denies  Sleep Study - Denies CPAP - Denies  LABS- 05/18/21: CBC, CMP, ABG, PT, PTT, T/S, UA, PCR, COVID  ASA-LD- 12/8  ERAS-No  HA1C- 05/18/21 Fasting Blood Sugar - 105-128 Checks Blood Sugar ___1__ time a day  Anesthesia- No  Pt denies having chest pain, sob, or fever at this time. All instructions explained to the pt, with a verbal understanding of the material. Pt agrees to go over the instructions while at home for a better understanding. Pt also instructed to wear a mask and social distance if he goes out after being tested for COVID-19. The opportunity to ask questions was provided.    Coronavirus Screening  Have you experienced the following symptoms:  Cough yes/no: No Fever (>100.24F)  yes/no: No Runny nose yes/no: No Sore throat yes/no: No Difficulty breathing/shortness of breath  yes/no: No  Have you or a family member traveled in the last 14 days and where? yes/no: No   If the patient indicates "YES" to the above questions, their PAT will be rescheduled to limit the exposure to others and, the surgeon will be notified. THE PATIENT WILL NEED TO BE ASYMPTOMATIC FOR 14 DAYS.   If the patient is not experiencing any of these symptoms, the PAT nurse will instruct them to NOT bring anyone with them to their appointment since they may have these symptoms or traveled as well.   Please remind your patients and families that hospital visitation restrictions are in effect and the importance of the restrictions.

## 2021-05-19 LAB — SARS CORONAVIRUS 2 (TAT 6-24 HRS): SARS Coronavirus 2: NEGATIVE

## 2021-05-20 ENCOUNTER — Other Ambulatory Visit: Payer: Self-pay

## 2021-05-20 ENCOUNTER — Inpatient Hospital Stay (HOSPITAL_COMMUNITY): Payer: Medicare Other | Admitting: Anesthesiology

## 2021-05-20 ENCOUNTER — Encounter (HOSPITAL_COMMUNITY)
Admission: RE | Disposition: A | Discharge status: Home / Self Care | Payer: Self-pay | Source: Home / Self Care | Attending: Thoracic Surgery (Cardiothoracic Vascular Surgery)

## 2021-05-20 ENCOUNTER — Inpatient Hospital Stay (HOSPITAL_COMMUNITY): Payer: Medicare Other

## 2021-05-20 ENCOUNTER — Inpatient Hospital Stay (HOSPITAL_COMMUNITY)
Admission: RE | Admit: 2021-05-20 | Discharge: 2021-06-02 | DRG: 163 | Disposition: A | Discharge status: Home / Self Care | Payer: Medicare Other | Attending: Thoracic Surgery (Cardiothoracic Vascular Surgery) | Admitting: Thoracic Surgery (Cardiothoracic Vascular Surgery)

## 2021-05-20 ENCOUNTER — Encounter (HOSPITAL_COMMUNITY): Payer: Self-pay | Admitting: Thoracic Surgery (Cardiothoracic Vascular Surgery)

## 2021-05-20 DIAGNOSIS — J939 Pneumothorax, unspecified: Secondary | ICD-10-CM | POA: Diagnosis not present

## 2021-05-20 DIAGNOSIS — C3431 Malignant neoplasm of lower lobe, right bronchus or lung: Secondary | ICD-10-CM | POA: Diagnosis present

## 2021-05-20 DIAGNOSIS — I10 Essential (primary) hypertension: Secondary | ICD-10-CM | POA: Diagnosis present

## 2021-05-20 DIAGNOSIS — Y95 Nosocomial condition: Secondary | ICD-10-CM | POA: Diagnosis not present

## 2021-05-20 DIAGNOSIS — J9602 Acute respiratory failure with hypercapnia: Secondary | ICD-10-CM | POA: Diagnosis not present

## 2021-05-20 DIAGNOSIS — R0602 Shortness of breath: Secondary | ICD-10-CM

## 2021-05-20 DIAGNOSIS — M21371 Foot drop, right foot: Secondary | ICD-10-CM | POA: Diagnosis present

## 2021-05-20 DIAGNOSIS — J9601 Acute respiratory failure with hypoxia: Secondary | ICD-10-CM | POA: Diagnosis not present

## 2021-05-20 DIAGNOSIS — Z9689 Presence of other specified functional implants: Secondary | ICD-10-CM

## 2021-05-20 DIAGNOSIS — Z7984 Long term (current) use of oral hypoglycemic drugs: Secondary | ICD-10-CM

## 2021-05-20 DIAGNOSIS — J189 Pneumonia, unspecified organism: Secondary | ICD-10-CM | POA: Diagnosis not present

## 2021-05-20 DIAGNOSIS — F17210 Nicotine dependence, cigarettes, uncomplicated: Secondary | ICD-10-CM | POA: Diagnosis present

## 2021-05-20 DIAGNOSIS — E8770 Fluid overload, unspecified: Secondary | ICD-10-CM | POA: Diagnosis not present

## 2021-05-20 DIAGNOSIS — Z20822 Contact with and (suspected) exposure to covid-19: Secondary | ICD-10-CM | POA: Diagnosis present

## 2021-05-20 DIAGNOSIS — Z4682 Encounter for fitting and adjustment of non-vascular catheter: Secondary | ICD-10-CM

## 2021-05-20 DIAGNOSIS — J9382 Other air leak: Secondary | ICD-10-CM | POA: Diagnosis not present

## 2021-05-20 DIAGNOSIS — Z96611 Presence of right artificial shoulder joint: Secondary | ICD-10-CM | POA: Diagnosis present

## 2021-05-20 DIAGNOSIS — Z7982 Long term (current) use of aspirin: Secondary | ICD-10-CM | POA: Diagnosis not present

## 2021-05-20 DIAGNOSIS — Z902 Acquired absence of lung [part of]: Secondary | ICD-10-CM | POA: Diagnosis not present

## 2021-05-20 DIAGNOSIS — Z09 Encounter for follow-up examination after completed treatment for conditions other than malignant neoplasm: Secondary | ICD-10-CM

## 2021-05-20 DIAGNOSIS — E119 Type 2 diabetes mellitus without complications: Secondary | ICD-10-CM | POA: Diagnosis present

## 2021-05-20 DIAGNOSIS — K5900 Constipation, unspecified: Secondary | ICD-10-CM | POA: Diagnosis not present

## 2021-05-20 DIAGNOSIS — D62 Acute posthemorrhagic anemia: Secondary | ICD-10-CM | POA: Diagnosis not present

## 2021-05-20 DIAGNOSIS — Z79899 Other long term (current) drug therapy: Secondary | ICD-10-CM

## 2021-05-20 DIAGNOSIS — M1990 Unspecified osteoarthritis, unspecified site: Secondary | ICD-10-CM | POA: Diagnosis present

## 2021-05-20 DIAGNOSIS — I454 Nonspecific intraventricular block: Secondary | ICD-10-CM | POA: Diagnosis not present

## 2021-05-20 DIAGNOSIS — R339 Retention of urine, unspecified: Secondary | ICD-10-CM | POA: Diagnosis not present

## 2021-05-20 DIAGNOSIS — J441 Chronic obstructive pulmonary disease with (acute) exacerbation: Secondary | ICD-10-CM | POA: Diagnosis not present

## 2021-05-20 DIAGNOSIS — I959 Hypotension, unspecified: Secondary | ICD-10-CM | POA: Diagnosis not present

## 2021-05-20 DIAGNOSIS — R0603 Acute respiratory distress: Secondary | ICD-10-CM

## 2021-05-20 HISTORY — PX: INTERCOSTAL NERVE BLOCK: SHX5021

## 2021-05-20 HISTORY — PX: LYMPH NODE DISSECTION: SHX5087

## 2021-05-20 LAB — GLUCOSE, CAPILLARY
Glucose-Capillary: 115 mg/dL — ABNORMAL HIGH (ref 70–99)
Glucose-Capillary: 138 mg/dL — ABNORMAL HIGH (ref 70–99)
Glucose-Capillary: 158 mg/dL — ABNORMAL HIGH (ref 70–99)
Glucose-Capillary: 198 mg/dL — ABNORMAL HIGH (ref 70–99)

## 2021-05-20 LAB — ABO/RH: ABO/RH(D): O POS

## 2021-05-20 IMAGING — DX DG CHEST 1V PORT
1 series · 3 of 3 positions shown · non-contrast
Comparison: Chest x-ray [DATE].  CT chest [DATE].

CLINICAL DATA: Right lower lobectomy and lymph node dissection.

EXAM:
PORTABLE CHEST 1 VIEW

[Series 1: chest · 0.14mm/px · 3 of 3 slices shown]
[im 1/3]
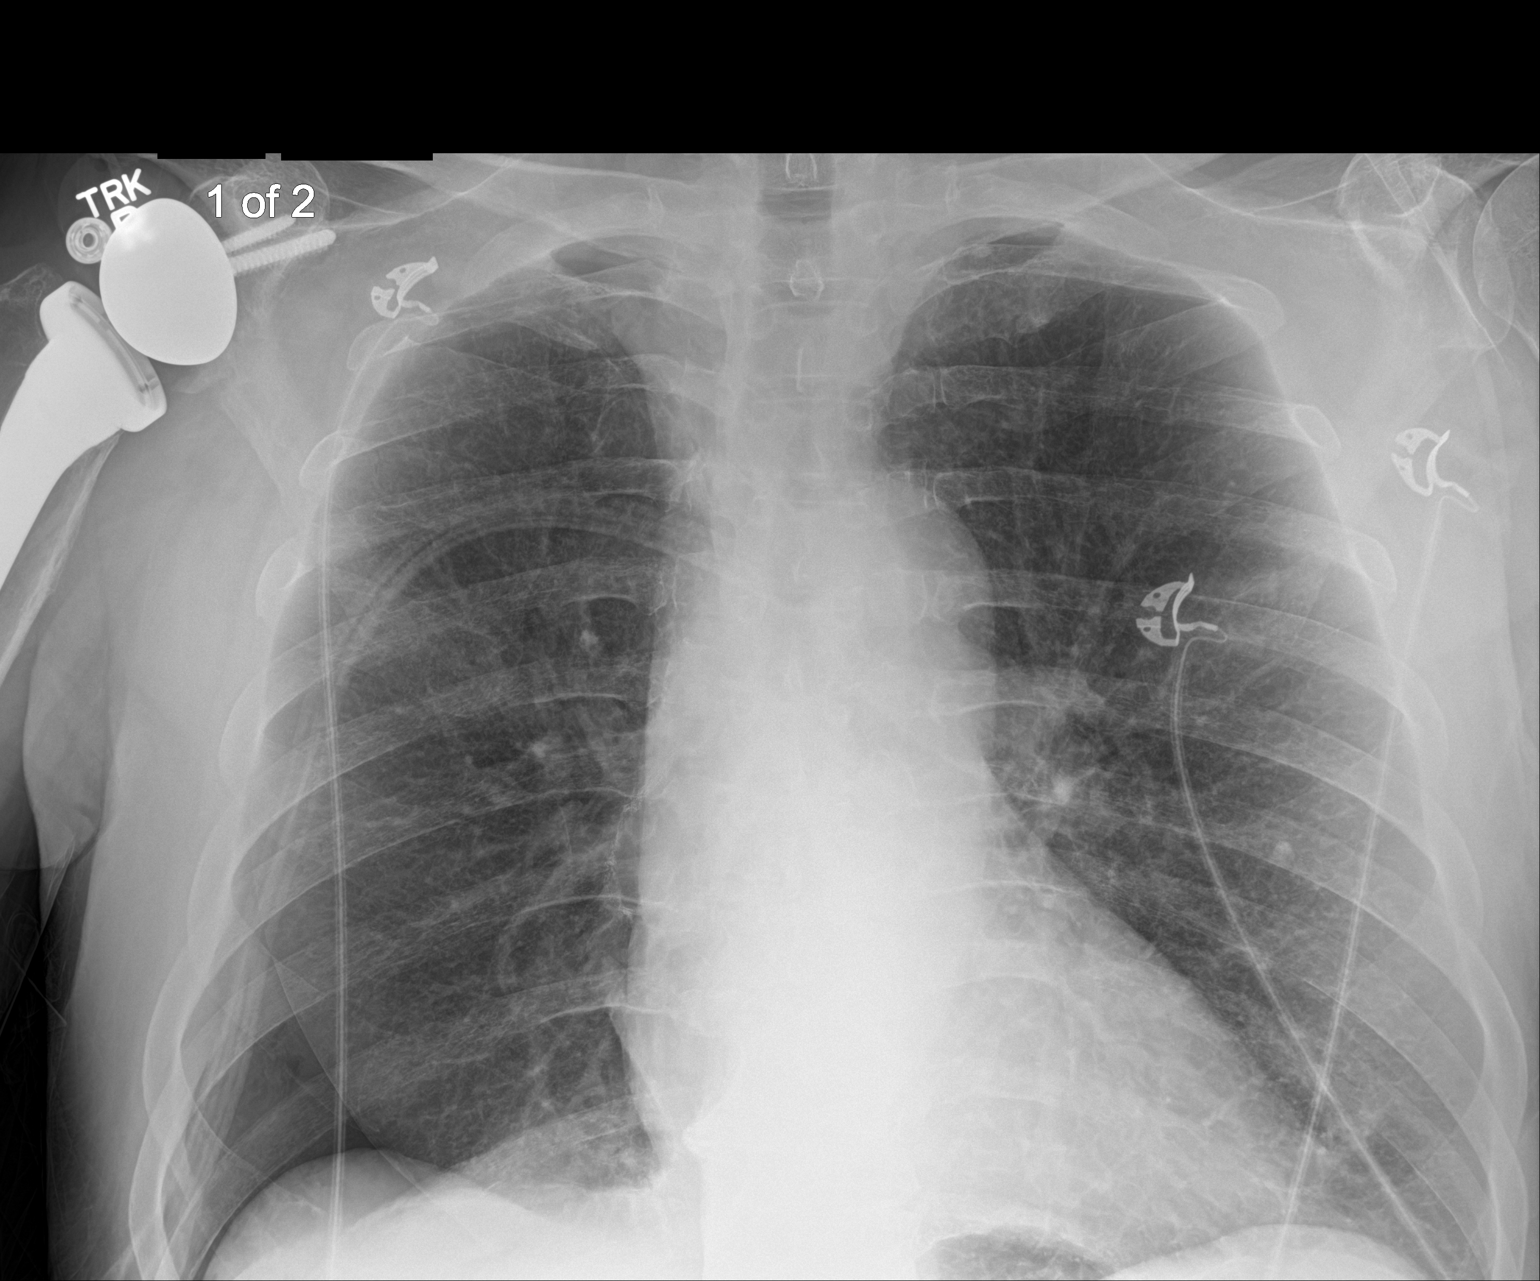
[im 2/3]
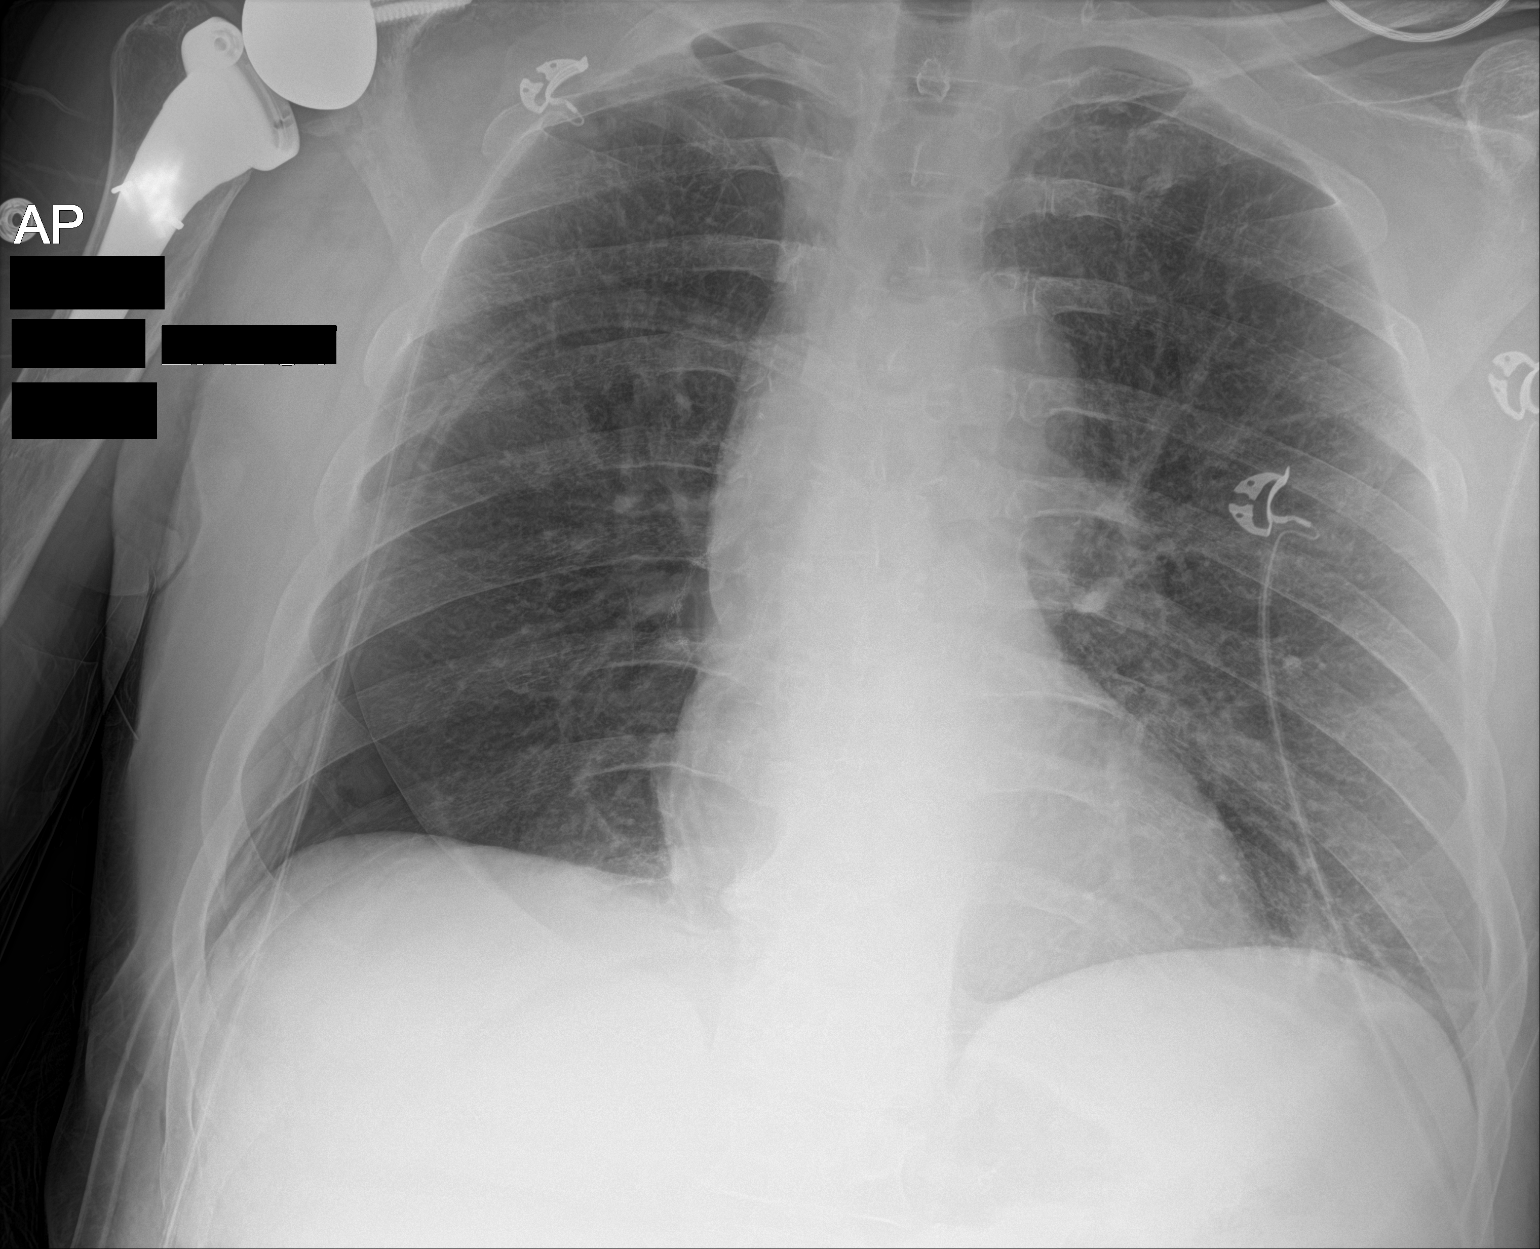
[im 3/3]
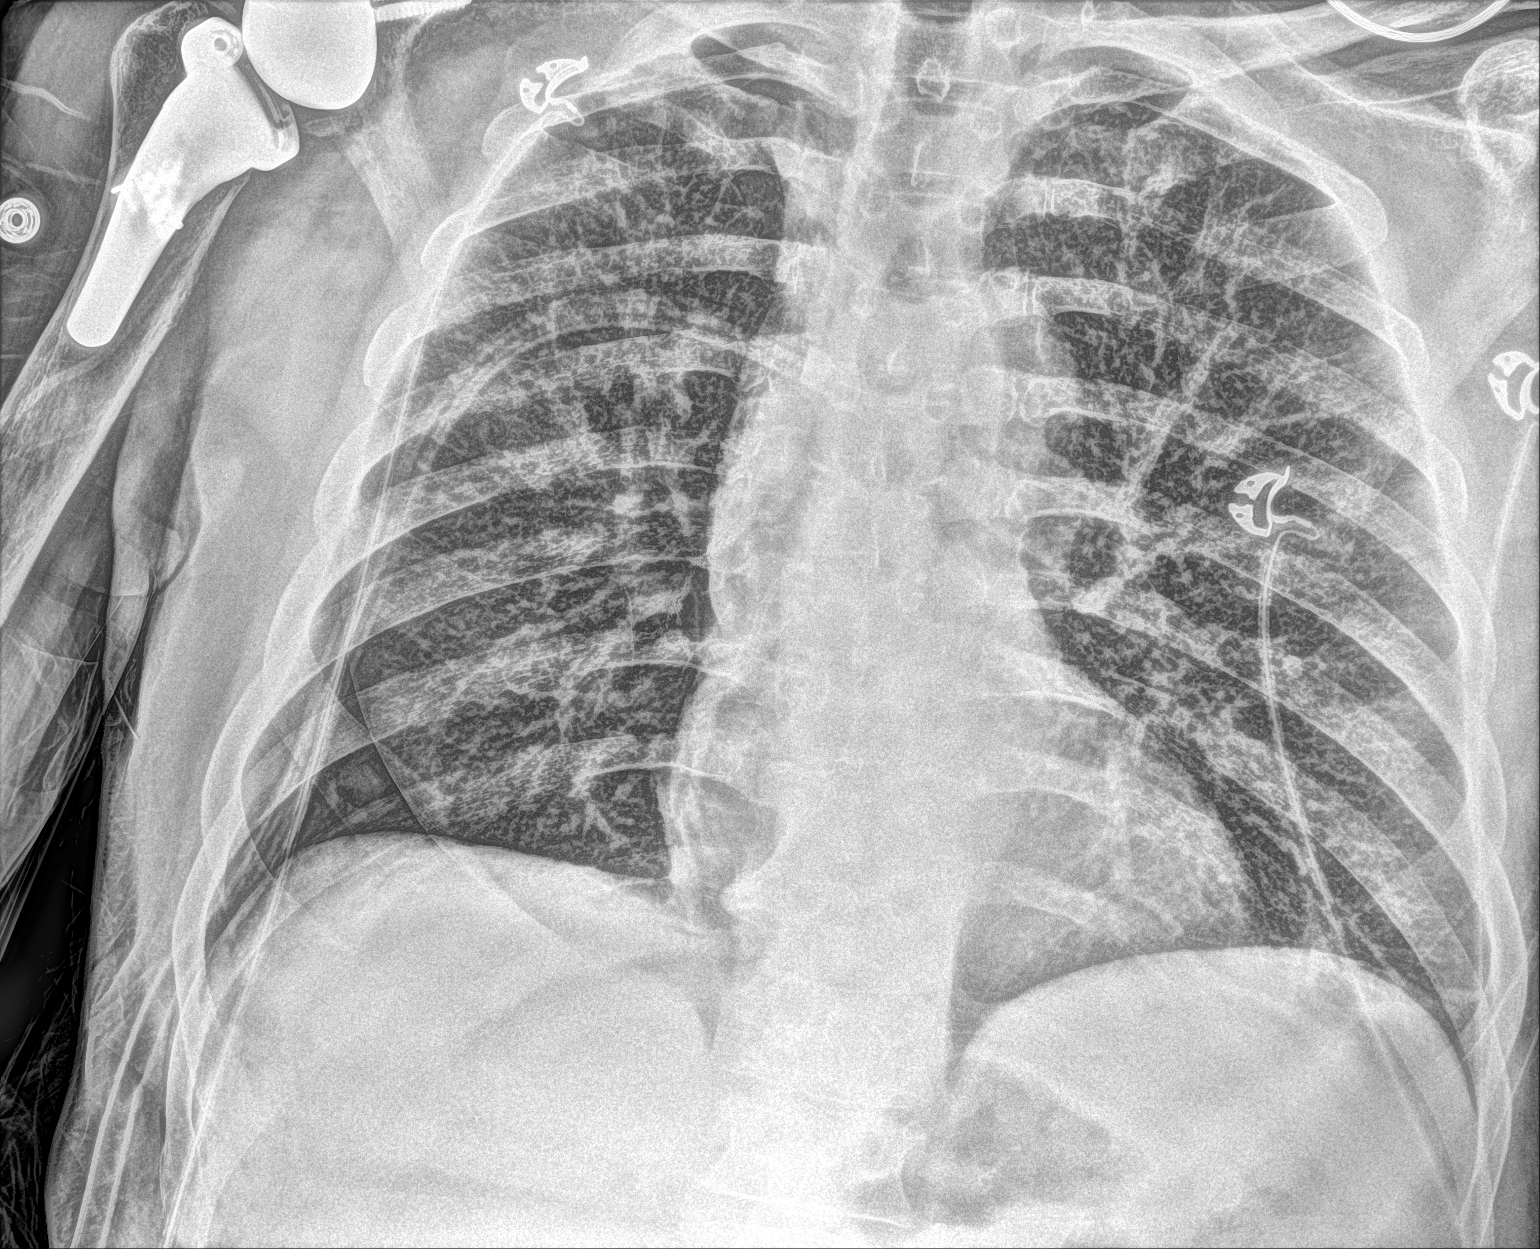

[3 of 3 positions shown; findings below may reference images not displayed]

FINDINGS: There are surgical changes in the right perihilar region. There is a
small to moderate-sized right pneumothorax in the lower right
hemithorax (approximally 10%) the lungs are otherwise well-expanded
and clear. Cardiomediastinal silhouette within normal limits. No
acute fractures. Right shoulder arthroplasty is present. Cervical
spinal fusion plate is visualized. Calcified nodule projecting over
the left mid lung corresponds to calcification in subcutaneous
tissues of the left back.
IMPRESSION: 1. New postsurgical changes in the right mediastinal region.
2. 10% pneumothorax in the lower right hemithorax.

## 2021-05-20 SURGERY — LOBECTOMY, LUNG, ROBOT-ASSISTED, USING VATS
Anesthesia: General | Site: Chest | Laterality: Right

## 2021-05-20 MED ORDER — FENTANYL CITRATE (PF) 100 MCG/2ML IJ SOLN
INTRAMUSCULAR | Status: AC
  Filled 2021-05-20: qty 2

## 2021-05-20 MED ORDER — DEXAMETHASONE SODIUM PHOSPHATE 10 MG/ML IJ SOLN
INTRAMUSCULAR | Status: AC
  Filled 2021-05-20: qty 1

## 2021-05-20 MED ORDER — ONDANSETRON HCL 4 MG/2ML IJ SOLN
INTRAMUSCULAR | Status: DC | PRN
  Administered 2021-05-20: 4 mg via INTRAVENOUS

## 2021-05-20 MED ORDER — LACTATED RINGERS IV SOLN: INTRAVENOUS | Status: DC

## 2021-05-20 MED ORDER — PROPOFOL 10 MG/ML IV BOLUS
INTRAVENOUS | Status: DC | PRN
  Administered 2021-05-20: 200 mg via INTRAVENOUS
  Administered 2021-05-20: 70 mg via INTRAVENOUS
  Administered 2021-05-20: 80 mg via INTRAVENOUS

## 2021-05-20 MED ORDER — UMECLIDINIUM BROMIDE 62.5 MCG/ACT IN AEPB
1.0000 | INHALATION_SPRAY | Freq: Every day | RESPIRATORY_TRACT | Status: DC
  Administered 2021-05-21: 1 via RESPIRATORY_TRACT
  Filled 2021-05-20: qty 7

## 2021-05-20 MED ORDER — LIDOCAINE 2% (20 MG/ML) 5 ML SYRINGE
INTRAMUSCULAR | Status: DC | PRN
  Administered 2021-05-20: 50 mg via INTRAVENOUS

## 2021-05-20 MED ORDER — HEMOSTATIC AGENTS (NO CHARGE) OPTIME
TOPICAL | Status: DC | PRN
  Administered 2021-05-20: 1 via TOPICAL

## 2021-05-20 MED ORDER — VARENICLINE TARTRATE 1 MG PO TABS
1.0000 mg | ORAL_TABLET | Freq: Two times a day (BID) | ORAL | Status: DC
  Administered 2021-05-21 – 2021-05-28 (×11): 1 mg via ORAL
  Filled 2021-05-20 (×26): qty 1

## 2021-05-20 MED ORDER — CEFAZOLIN SODIUM-DEXTROSE 2-4 GM/100ML-% IV SOLN
INTRAVENOUS | Status: AC
  Administered 2021-05-21: 2 g via INTRAVENOUS
  Filled 2021-05-20: qty 100

## 2021-05-20 MED ORDER — DEXAMETHASONE SODIUM PHOSPHATE 10 MG/ML IJ SOLN
INTRAMUSCULAR | Status: DC | PRN
  Administered 2021-05-20: 10 mg via INTRAVENOUS

## 2021-05-20 MED ORDER — BISACODYL 5 MG PO TBEC
10.0000 mg | DELAYED_RELEASE_TABLET | Freq: Every day | ORAL | Status: DC
  Administered 2021-05-21 – 2021-05-30 (×6): 10 mg via ORAL
  Filled 2021-05-20 (×9): qty 2

## 2021-05-20 MED ORDER — MORPHINE SULFATE (PF) 2 MG/ML IV SOLN: 2.0000 mg | INTRAVENOUS | Status: DC | PRN

## 2021-05-20 MED ORDER — ROCURONIUM BROMIDE 10 MG/ML (PF) SYRINGE
PREFILLED_SYRINGE | INTRAVENOUS | Status: AC
  Filled 2021-05-20: qty 20

## 2021-05-20 MED ORDER — ACETAMINOPHEN 500 MG PO TABS
1000.0000 mg | ORAL_TABLET | Freq: Four times a day (QID) | ORAL | Status: DC
  Administered 2021-05-20 – 2021-05-23 (×13): 1000 mg via ORAL
  Filled 2021-05-20 (×13): qty 2

## 2021-05-20 MED ORDER — MIDAZOLAM HCL 2 MG/2ML IJ SOLN
INTRAMUSCULAR | Status: DC | PRN
  Administered 2021-05-20: 2 mg via INTRAVENOUS

## 2021-05-20 MED ORDER — BUPIVACAINE HCL (PF) 0.5 % IJ SOLN
INTRAMUSCULAR | Status: AC
  Filled 2021-05-20: qty 30

## 2021-05-20 MED ORDER — SODIUM CHLORIDE FLUSH 0.9 % IV SOLN
INTRAVENOUS | Status: DC | PRN
  Administered 2021-05-20: 100 mL

## 2021-05-20 MED ORDER — ONDANSETRON HCL 4 MG/2ML IJ SOLN: 4.0000 mg | Freq: Four times a day (QID) | INTRAMUSCULAR | Status: DC | PRN

## 2021-05-20 MED ORDER — PHENYLEPHRINE HCL-NACL 20-0.9 MG/250ML-% IV SOLN
INTRAVENOUS | Status: DC | PRN
  Administered 2021-05-20: 40 ug/min via INTRAVENOUS

## 2021-05-20 MED ORDER — SENNOSIDES-DOCUSATE SODIUM 8.6-50 MG PO TABS
1.0000 | ORAL_TABLET | Freq: Every day | ORAL | Status: DC
  Administered 2021-05-20 – 2021-05-29 (×8): 1 via ORAL
  Filled 2021-05-20 (×12): qty 1

## 2021-05-20 MED ORDER — INSULIN ASPART 100 UNIT/ML IJ SOLN
0.0000 [IU] | INTRAMUSCULAR | Status: DC
  Administered 2021-05-20: 4 [IU] via SUBCUTANEOUS
  Administered 2021-05-21 (×3): 2 [IU] via SUBCUTANEOUS

## 2021-05-20 MED ORDER — ONDANSETRON HCL 4 MG/2ML IJ SOLN
INTRAMUSCULAR | Status: AC
  Filled 2021-05-20: qty 2

## 2021-05-20 MED ORDER — ACETAMINOPHEN 160 MG/5ML PO SOLN
1000.0000 mg | Freq: Four times a day (QID) | ORAL | Status: DC
  Filled 2021-05-20: qty 40.6

## 2021-05-20 MED ORDER — CHLORHEXIDINE GLUCONATE 0.12 % MT SOLN: 15.0000 mL | Freq: Once | OROMUCOSAL | Status: AC

## 2021-05-20 MED ORDER — FENTANYL CITRATE (PF) 250 MCG/5ML IJ SOLN
INTRAMUSCULAR | Status: AC
  Filled 2021-05-20: qty 5

## 2021-05-20 MED ORDER — PHENYLEPHRINE 40 MCG/ML (10ML) SYRINGE FOR IV PUSH (FOR BLOOD PRESSURE SUPPORT)
PREFILLED_SYRINGE | INTRAVENOUS | Status: AC
  Filled 2021-05-20: qty 10

## 2021-05-20 MED ORDER — FENTANYL CITRATE (PF) 100 MCG/2ML IJ SOLN
25.0000 ug | INTRAMUSCULAR | Status: DC | PRN
  Administered 2021-05-20: 50 ug via INTRAVENOUS

## 2021-05-20 MED ORDER — SODIUM CHLORIDE 0.9 % IR SOLN
Status: DC | PRN
  Administered 2021-05-20: 1000 mL

## 2021-05-20 MED ORDER — OXYCODONE HCL 5 MG/5ML PO SOLN: 5.0000 mg | Freq: Once | ORAL | Status: DC | PRN

## 2021-05-20 MED ORDER — LACTATED RINGERS IV SOLN: INTRAVENOUS | Status: DC | PRN

## 2021-05-20 MED ORDER — FLUTICASONE FUROATE-VILANTEROL 100-25 MCG/ACT IN AEPB
1.0000 | INHALATION_SPRAY | Freq: Every day | RESPIRATORY_TRACT | Status: DC
  Administered 2021-05-21: 1 via RESPIRATORY_TRACT
  Filled 2021-05-20: qty 28

## 2021-05-20 MED ORDER — TRAMADOL HCL 50 MG PO TABS
50.0000 mg | ORAL_TABLET | Freq: Four times a day (QID) | ORAL | Status: DC | PRN
  Administered 2021-05-21 (×2): 100 mg via ORAL
  Filled 2021-05-20 (×2): qty 2

## 2021-05-20 MED ORDER — ORAL CARE MOUTH RINSE: 15.0000 mL | Freq: Once | OROMUCOSAL | Status: AC

## 2021-05-20 MED ORDER — FENTANYL CITRATE (PF) 250 MCG/5ML IJ SOLN
INTRAMUSCULAR | Status: DC | PRN
  Administered 2021-05-20: 100 ug via INTRAVENOUS
  Administered 2021-05-20: 50 ug via INTRAVENOUS
  Administered 2021-05-20: 100 ug via INTRAVENOUS

## 2021-05-20 MED ORDER — MIDAZOLAM HCL 2 MG/2ML IJ SOLN
INTRAMUSCULAR | Status: AC
  Filled 2021-05-20: qty 2

## 2021-05-20 MED ORDER — ALBUTEROL SULFATE (2.5 MG/3ML) 0.083% IN NEBU
2.5000 mg | INHALATION_SOLUTION | Freq: Four times a day (QID) | RESPIRATORY_TRACT | Status: DC | PRN
  Administered 2021-05-21: 2.5 mg via RESPIRATORY_TRACT
  Filled 2021-05-20: qty 3

## 2021-05-20 MED ORDER — OXYCODONE HCL 5 MG PO TABS
5.0000 mg | ORAL_TABLET | ORAL | Status: DC | PRN
  Administered 2021-05-20 – 2021-05-30 (×32): 10 mg via ORAL
  Administered 2021-05-31: 10:00:00 5 mg via ORAL
  Administered 2021-05-31 (×2): 10 mg via ORAL
  Administered 2021-05-31: 09:00:00 5 mg via ORAL
  Administered 2021-06-01 (×4): 10 mg via ORAL
  Filled 2021-05-20: qty 2
  Filled 2021-05-20: qty 1
  Filled 2021-05-20 (×22): qty 2
  Filled 2021-05-20: qty 1
  Filled 2021-05-20 (×17): qty 2

## 2021-05-20 MED ORDER — PROPOFOL 10 MG/ML IV BOLUS
INTRAVENOUS | Status: AC
  Filled 2021-05-20: qty 20

## 2021-05-20 MED ORDER — OXYCODONE HCL 5 MG PO TABS: 5.0000 mg | ORAL_TABLET | Freq: Once | ORAL | Status: DC | PRN

## 2021-05-20 MED ORDER — CHLORHEXIDINE GLUCONATE 0.12 % MT SOLN
OROMUCOSAL | Status: AC
  Administered 2021-05-20: 15 mL via OROMUCOSAL
  Filled 2021-05-20: qty 15

## 2021-05-20 MED ORDER — ONDANSETRON HCL 4 MG/2ML IJ SOLN
4.0000 mg | Freq: Four times a day (QID) | INTRAMUSCULAR | Status: DC | PRN
  Administered 2021-05-22 – 2021-05-23 (×2): 4 mg via INTRAVENOUS
  Filled 2021-05-20 (×2): qty 2

## 2021-05-20 MED ORDER — CEFAZOLIN SODIUM-DEXTROSE 2-4 GM/100ML-% IV SOLN
2.0000 g | Freq: Three times a day (TID) | INTRAVENOUS | Status: AC
  Administered 2021-05-20: 2 g via INTRAVENOUS
  Filled 2021-05-20 (×2): qty 100

## 2021-05-20 MED ORDER — KETOROLAC TROMETHAMINE 15 MG/ML IJ SOLN
15.0000 mg | Freq: Four times a day (QID) | INTRAMUSCULAR | Status: AC
  Administered 2021-05-20 – 2021-05-22 (×8): 15 mg via INTRAVENOUS
  Filled 2021-05-20 (×8): qty 1

## 2021-05-20 MED ORDER — SUGAMMADEX SODIUM 200 MG/2ML IV SOLN
INTRAVENOUS | Status: DC | PRN
  Administered 2021-05-20: 400 mg via INTRAVENOUS

## 2021-05-20 MED ORDER — ALBUTEROL SULFATE HFA 108 (90 BASE) MCG/ACT IN AERS: 2.0000 | INHALATION_SPRAY | Freq: Four times a day (QID) | RESPIRATORY_TRACT | Status: DC | PRN

## 2021-05-20 MED ORDER — SODIUM CHLORIDE 0.45 % IV SOLN: INTRAVENOUS | Status: DC

## 2021-05-20 MED ORDER — ROCURONIUM BROMIDE 10 MG/ML (PF) SYRINGE
PREFILLED_SYRINGE | INTRAVENOUS | Status: DC | PRN
  Administered 2021-05-20: 40 mg via INTRAVENOUS
  Administered 2021-05-20: 100 mg via INTRAVENOUS
  Administered 2021-05-20: 50 mg via INTRAVENOUS
  Administered 2021-05-20: 60 mg via INTRAVENOUS

## 2021-05-20 MED ORDER — ASPIRIN EC 81 MG PO TBEC
81.0000 mg | DELAYED_RELEASE_TABLET | Freq: Every day | ORAL | Status: DC
  Administered 2021-05-21 – 2021-06-02 (×13): 81 mg via ORAL
  Filled 2021-05-20 (×13): qty 1

## 2021-05-20 MED ORDER — CEFAZOLIN SODIUM-DEXTROSE 2-4 GM/100ML-% IV SOLN
2.0000 g | INTRAVENOUS | Status: AC
  Administered 2021-05-20: 2 g via INTRAVENOUS

## 2021-05-20 MED ORDER — LIDOCAINE 2% (20 MG/ML) 5 ML SYRINGE
INTRAMUSCULAR | Status: AC
  Filled 2021-05-20: qty 5

## 2021-05-20 MED ORDER — 0.9 % SODIUM CHLORIDE (POUR BTL) OPTIME
TOPICAL | Status: DC | PRN
  Administered 2021-05-20: 2000 mL

## 2021-05-20 MED ORDER — BUPIVACAINE LIPOSOME 1.3 % IJ SUSP
INTRAMUSCULAR | Status: AC
  Filled 2021-05-20: qty 20

## 2021-05-20 MED ORDER — LISINOPRIL 10 MG PO TABS
10.0000 mg | ORAL_TABLET | Freq: Every day | ORAL | Status: DC
  Administered 2021-05-21 – 2021-05-30 (×10): 10 mg via ORAL
  Filled 2021-05-20 (×11): qty 1

## 2021-05-20 MED ORDER — FLUTICASONE-UMECLIDIN-VILANT 100-62.5-25 MCG/ACT IN AEPB: 1.0000 | INHALATION_SPRAY | Freq: Every day | RESPIRATORY_TRACT | Status: DC

## 2021-05-20 MED ORDER — ENOXAPARIN SODIUM 40 MG/0.4ML IJ SOSY
40.0000 mg | PREFILLED_SYRINGE | Freq: Every day | INTRAMUSCULAR | Status: DC
  Administered 2021-05-21 – 2021-06-02 (×13): 40 mg via SUBCUTANEOUS
  Filled 2021-05-20 (×12): qty 0.4

## 2021-05-20 MED ORDER — SODIUM CHLORIDE 0.9 % IV SOLN: INTRAVENOUS | Status: DC | PRN

## 2021-05-20 SURGICAL SUPPLY — 107 items
BLADE CLIPPER SURG (BLADE) IMPLANT
CANISTER SUCT 3000ML PPV (MISCELLANEOUS) ×6 IMPLANT
CANNULA REDUC XI 12-8 STAPL (CANNULA) ×4
CANNULA REDUC XI 12-8MM STAPL (CANNULA) ×2
CANNULA REDUCER 12-8 DVNC XI (CANNULA) ×2 IMPLANT
CATH THORACIC 28FR (CATHETERS) IMPLANT
CATH THORACIC 28FR RT ANG (CATHETERS) IMPLANT
CATH THORACIC 36FR (CATHETERS) IMPLANT
CATH THORACIC 36FR RT ANG (CATHETERS) IMPLANT
CLIP TI WIDE RED SMALL 6 (CLIP) ×3 IMPLANT
CLIP VESOCCLUDE MED 6/CT (CLIP) IMPLANT
CNTNR URN SCR LID CUP LEK RST (MISCELLANEOUS) ×13 IMPLANT
CONN ST 1/4X3/8  BEN (MISCELLANEOUS) ×3
CONN ST 1/4X3/8 BEN (MISCELLANEOUS) ×1 IMPLANT
CONN Y 3/8X3/8X3/8  BEN (MISCELLANEOUS)
CONN Y 3/8X3/8X3/8 BEN (MISCELLANEOUS) IMPLANT
CONT SPEC 4OZ STRL OR WHT (MISCELLANEOUS) ×39
DEFOGGER SCOPE WARMER CLEARIFY (MISCELLANEOUS) ×3 IMPLANT
DERMABOND ADVANCED (GAUZE/BANDAGES/DRESSINGS) ×2
DERMABOND ADVANCED .7 DNX12 (GAUZE/BANDAGES/DRESSINGS) ×1 IMPLANT
DRAIN CHANNEL 28F RND 3/8 FF (WOUND CARE) ×3 IMPLANT
DRAIN CHANNEL 32F RND 10.7 FF (WOUND CARE) IMPLANT
DRAPE ARM DVNC X/XI (DISPOSABLE) ×4 IMPLANT
DRAPE COLUMN DVNC XI (DISPOSABLE) ×1 IMPLANT
DRAPE CV SPLIT W-CLR ANES SCRN (DRAPES) ×3 IMPLANT
DRAPE DA VINCI XI ARM (DISPOSABLE) ×12
DRAPE DA VINCI XI COLUMN (DISPOSABLE) ×3
DRAPE HALF SHEET 40X57 (DRAPES) ×3 IMPLANT
DRAPE INCISE IOBAN 66X45 STRL (DRAPES) IMPLANT
DRAPE ORTHO SPLIT 77X108 STRL (DRAPES) ×3
DRAPE SURG ORHT 6 SPLT 77X108 (DRAPES) ×1 IMPLANT
ELECT BLADE 6.5 EXT (BLADE) ×3 IMPLANT
ELECT REM PT RETURN 9FT ADLT (ELECTROSURGICAL) ×3
ELECTRODE REM PT RTRN 9FT ADLT (ELECTROSURGICAL) ×1 IMPLANT
GAUZE KITTNER 4X5 RF (MISCELLANEOUS) ×6 IMPLANT
GAUZE SPONGE 4X4 12PLY STRL (GAUZE/BANDAGES/DRESSINGS) ×3 IMPLANT
GLOVE SURG MICRO LTX SZ7.5 (GLOVE) ×9 IMPLANT
GLOVE SURG POLYISO LF SZ8 (GLOVE) ×6 IMPLANT
GOWN STRL REUS W/ TWL LRG LVL3 (GOWN DISPOSABLE) ×1 IMPLANT
GOWN STRL REUS W/ TWL XL LVL3 (GOWN DISPOSABLE) ×3 IMPLANT
GOWN STRL REUS W/TWL 2XL LVL3 (GOWN DISPOSABLE) ×3 IMPLANT
GOWN STRL REUS W/TWL LRG LVL3 (GOWN DISPOSABLE) ×3
GOWN STRL REUS W/TWL XL LVL3 (GOWN DISPOSABLE) ×9
HEMOSTAT SURGICEL 2X14 (HEMOSTASIS) ×9 IMPLANT
IRRIGATION STRYKERFLOW (MISCELLANEOUS) ×1 IMPLANT
IRRIGATOR STRYKERFLOW (MISCELLANEOUS) ×3
KIT BASIN OR (CUSTOM PROCEDURE TRAY) ×3 IMPLANT
KIT SUCTION CATH 14FR (SUCTIONS) IMPLANT
KIT TURNOVER KIT B (KITS) ×3 IMPLANT
LOOP VESSEL SUPERMAXI WHITE (MISCELLANEOUS) IMPLANT
NEEDLE HYPO 25GX1X1/2 BEV (NEEDLE) ×3 IMPLANT
NEEDLE SPNL 22GX3.5 QUINCKE BK (NEEDLE) ×3 IMPLANT
NS IRRIG 1000ML POUR BTL (IV SOLUTION) ×6 IMPLANT
PACK CHEST (CUSTOM PROCEDURE TRAY) ×3 IMPLANT
PAD ARMBOARD 7.5X6 YLW CONV (MISCELLANEOUS) ×6 IMPLANT
PORT ACCESS TROCAR AIRSEAL 12 (TROCAR) ×1 IMPLANT
PORT ACCESS TROCAR AIRSEAL 5M (TROCAR) ×2
RELOAD STAPLER 2.5X45 WHT DVNC (STAPLE) ×3 IMPLANT
RELOAD STAPLER 3.5X45 BLU DVNC (STAPLE) ×3 IMPLANT
RELOAD STAPLER 4.3X45 GRN DVNC (STAPLE) ×2 IMPLANT
SCISSORS LAP 5X35 DISP (ENDOMECHANICALS) IMPLANT
SEAL CANN UNIV 5-8 DVNC XI (MISCELLANEOUS) ×2 IMPLANT
SEAL XI 5MM-8MM UNIVERSAL (MISCELLANEOUS) ×6
SEALANT PROGEL (MISCELLANEOUS) IMPLANT
SEALANT SURG COSEAL 4ML (VASCULAR PRODUCTS) IMPLANT
SEALANT SURG COSEAL 8ML (VASCULAR PRODUCTS) IMPLANT
SET TRI-LUMEN FLTR TB AIRSEAL (TUBING) ×3 IMPLANT
SHEARS HARMONIC HDI 20CM (ELECTROSURGICAL) IMPLANT
SOLUTION ELECTROLUBE (MISCELLANEOUS) ×3 IMPLANT
SPONGE INTESTINAL PEANUT (DISPOSABLE) IMPLANT
SPONGE TONSIL TAPE 1 RFD (DISPOSABLE) IMPLANT
STAPLER 45 SUREFORM CVD (STAPLE) ×3
STAPLER 45 SUREFORM CVD DVNC (STAPLE) ×1 IMPLANT
STAPLER CANNULA SEAL DVNC XI (STAPLE) ×2 IMPLANT
STAPLER CANNULA SEAL XI (STAPLE) ×6
STAPLER RELOAD 2.5X45 WHITE (STAPLE) ×9
STAPLER RELOAD 2.5X45 WHT DVNC (STAPLE) ×3
STAPLER RELOAD 3.5X45 BLU DVNC (STAPLE) ×3
STAPLER RELOAD 3.5X45 BLUE (STAPLE) ×9
STAPLER RELOAD 4.3X45 GREEN (STAPLE) ×6
STAPLER RELOAD 4.3X45 GRN DVNC (STAPLE) ×2
SUT PDS AB 3-0 SH 27 (SUTURE) IMPLANT
SUT PROLENE 4 0 RB 1 (SUTURE)
SUT PROLENE 4-0 RB1 .5 CRCL 36 (SUTURE) IMPLANT
SUT SILK  1 MH (SUTURE) ×3
SUT SILK 1 MH (SUTURE) ×1 IMPLANT
SUT SILK 1 TIES 10X30 (SUTURE) ×3 IMPLANT
SUT SILK 2 0 SH (SUTURE) IMPLANT
SUT SILK 2 0SH CR/8 30 (SUTURE) ×3 IMPLANT
SUT SILK 3 0SH CR/8 30 (SUTURE) IMPLANT
SUT VIC AB 1 CTX 36 (SUTURE) ×3
SUT VIC AB 1 CTX36XBRD ANBCTR (SUTURE) ×1 IMPLANT
SUT VIC AB 2-0 CTX 36 (SUTURE) ×3 IMPLANT
SUT VIC AB 3-0 MH 27 (SUTURE) IMPLANT
SUT VIC AB 3-0 X1 27 (SUTURE) ×6 IMPLANT
SUT VICRYL 0 TIES 12 18 (SUTURE) ×3 IMPLANT
SUT VICRYL 0 UR6 27IN ABS (SUTURE) ×6 IMPLANT
SUT VICRYL 2 TP 1 (SUTURE) IMPLANT
SYR 20CC LL (SYRINGE) ×6 IMPLANT
SYSTEM RETRIEVAL ANCHOR 15 (MISCELLANEOUS) ×3 IMPLANT
SYSTEM SAHARA CHEST DRAIN ATS (WOUND CARE) ×3 IMPLANT
TAPE CLOTH 4X10 WHT NS (GAUZE/BANDAGES/DRESSINGS) ×3 IMPLANT
TAPE CLOTH SURG 4X10 WHT LF (GAUZE/BANDAGES/DRESSINGS) ×3 IMPLANT
TIP APPLICATOR SPRAY EXTEND 16 (VASCULAR PRODUCTS) IMPLANT
TOWEL GREEN STERILE (TOWEL DISPOSABLE) ×3 IMPLANT
TRAY FOLEY MTR SLVR 16FR STAT (SET/KITS/TRAYS/PACK) ×3 IMPLANT
WATER STERILE IRR 1000ML POUR (IV SOLUTION) ×6 IMPLANT

## 2021-05-20 NOTE — Discharge Summary (Addendum)
Physician Discharge Summary  Patient ID: Stephen Diaz MRN: [id number] DOB/AGE: 66-Sep-1957 66 y.o.  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,N0)  Patient Active Problem List   Diagnosis Date Noted   S/P Xi Robotic Assisted Video Thoracoscopy with Right Lower Lobectomy 05/20/2021   Nodule of lower lobe of right lung 05/12/2021   COPD (chronic obstructive pulmonary disease) ([place]) 05/12/2021   Type 2 diabetes mellitus ([place]) 05/12/2021   Colitis 04/18/2021   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/08/2020   Discharged Condition: stable  History of Present Illness:  Stephen Diaz is a 65 year old man with a history of tobacco abuse, COPD, type 2 diabetes, hypertension, arthritis, and right foot drop.  He recently had a low-dose CT for lung cancer screening.  It showed a cavitary lesion in the right lower lobe had increased in size significantly compared to the scan from a year prior.  It initially was 1.5 cm and thin-walled.  It now measures 2.8 cm and there was variable wall thickness up to 4 mm in some areas.  There was some borderline adenopathy.  He was referred to Dr. Verlee Monte.  A PET/CT showed this nodule was intensely hypermetabolic with an SUV greater than 6.  He was evaluated by Dr. Roxan Hockey at which time he admitted to smoking about a pack and a half of cigarettes daily for over 40 years.  He recently has cut back to 3 to 4 cigarettes a day.  He feels like the Chantix is helping.  He gets short of breath when he carries things like  his fishing gear.  He denies any chest pain, pressure, or tightness.  Uses his rescue inhaler a couple of times a week.  He can walk up more than 2 flights of stairs.  No change in appetite or weight loss.  No unusual headaches or visual changes.  He can walk about three fourths of a mile before having to stop due to cramping in his left calf.  It was felt surgical resection would be indicated.  This could be done robotically.  Due to the size of the nodule the patient would require a lobectomy.  The risks and benefits of the procedure were explained to the patient and he was agreeable to proceed.  Hospital Course:  Stephen Diaz presented to [facility] on 05/20/2021.  He was taken to the operating room and underwent Robotic Assisted Right Lower Lobectomy with intercostal nerve block and lymph node dissection.  He tolerated the procedure without difficulty and was taken to the PACU in stable condition and later moved to [facility] progressive care.  He initially did not have an air leak in the operating room so the chest tube was left to waterseal.  On the next morning, he was noted to have a significant air leak as well as a left apical space on chest x-ray.  The chest tube was placed to suction.  Later that same day, he developed some respiratory distress.  This was felt to be related to volume overload.  He was diuresed.  He was given breathing  treatments and symptoms improved.  The patient's dyspnea worsened and was ultimately transferred to the ICU for further monitoring and pulmonary consult was obtained.  They adjusted patient's inhaler and bronchodilator regimen.  He developed urinary retention which required I/O catheterization.  His respiratory status improved and he was felt stable for transfer to the telemetry unit on 05/24/2021.  His air leak improved and his chest tube was transitioned to water seal.  Follow up CXR showed stable basilar pneumothorax and subcutaneous emphysema.  The patient's air leak  persisted.  He did not want to be discharged home with a chest tube.  He was been resumed on his home regimen of Metformin. The patient's air leak did not resolve.  His basilar space persisted.  The patient did not want to be discharged home with a chest tube in place.  Due to this we placed his chest tube back to water seal on 05/27/2021.  This remained on suction for 48 hours.  Patient had no air leak the am of 12/19. Chest xray showed right fluid filling basilar space. Chest tube was placed to water seal. Follow up chest x ray showed basilar component had filled up with fluid.  There was a tiny apical space that persisted.  His chest tube was placed back on water seal.  Follow up CXR remained stable.  His chest tube was removed on 05/31/2021.  F/U CXR showed no evidence of pneumothorax.  The patient developed hypotension with SBP dropping into the 60-70s.  He was treated with IV fluids with some response.  Repeat labs were obtained and showed no evidence of blood loss, however he was noted to have a leukocytosis of 24K.  He was not febrile.  Expectorated sputum culture was obtained and the patient was started on Vanc and Zosyn for presumed Hospital acquired pneumonia.  The patient's sputum culture grew Enterobacter Aerogenes.  Due to this he was transitioned to Ceftriaxone for additional coverage.  Final sensitivities showed susceptibility to Ciprofloxacin and he will be given a 10 day course.  His blood pressure has improved and he will resume Lisinopril in a few days.  His surgical incisions show no evidence of infection.  There is some mild erythema from suture irritation.  He is medically stable for discharge home today.    Consults: None  Significant Diagnostic Studies: nuclear medicine:   IMPRESSION: Increased metabolic activity associated with the cavity in the superior segment of the RIGHT lower lobe abutting the major fissure and displaying mildly irregular wall thickening, concerning  for primary bronchogenic neoplasm.   Nodularity in the LEFT gluteal fold with increased metabolic activity, raising the question of cutaneous neoplasm. Direct clinical inspection is suggested, also with increased metabolic activity in the RIGHT inguinal crease with suggestion of mild skin thickening in this location. Would also correlate with any signs of inflammation in these locations.   Skin thickening about the LEFT scrotum with increased metabolic activity as well, nonspecific either neoplastic or inflammatory. Correlate with direct clinical inspection and with sonographic assessment as warranted.   3 cm infrarenal abdominal aortic aneurysm. Recommend follow-up every 3 years.   Reference: J Am Coll Radiol 2993;71:696-789.   Aortic Atherosclerosis (ICD10-I70.0) and Emphysema (ICD10-J43.9).   Aortic aneurysm NOS (ICD10-I71.9).   Treatments: surgery:   Operative Report    DATE OF PROCEDURE: 05/20/2021   PREOPERATIVE DIAGNOSIS:  Right lower lobe lung nodule.   POSTOPERATIVE DIAGNOSIS:  Non-small cell carcinoma, right lower lobe, clinical stage IA (T1, N0).   PROCEDURE:  Xi robotic-assisted right lower lobectomy, lymph node dissection, intercostal nerve blocks levels 3 through 10.   SURGEON:  Revonda Standard. Roxan Hockey, MD   ASSISTANT:  Ellwood Handler, PA-C  PATHOLOGY: SURGICAL PATHOLOGY  CASE: [id number]  PATIENT: Stephen Diaz  Surgical Pathology Report   Clinical History: Cavitary RLL mass (nt)   FINAL MICROSCOPIC DIAGNOSIS:   A. LYMPH NODE, LEVEL 8, BIOPSY:  - Profiles of lymph node, negative for malignancy.   B. LYMPH NODE, LEVEL 9, BIOPSY:  - Profiles of lymph node, negative for malignancy.   C. LYMPH NODE, LEVEL 7, BIOPSY:  - Profiles of lymph node, negative for malignancy.   D. LYMPH NODE, LEVEL 7#2, BIOPSY:  - Profiles of lymph node, negative for malignancy.   E. LYMPH NODE, LEVEL 7#3, BIOPSY:  - Profiles of lymph node, negative for malignancy.    F. LYMPH NODE, LEVEL 11, BIOPSY:  - Profiles of lymph node, negative for malignancy.   G. LYMPH NODE, LEVEL 10, BIOPSY:  - Profiles of lymph node, negative for malignancy.   H. LYMPH NODE, LEVEL 11#2, BIOPSY:  - Profiles of lymph node, negative for malignancy.   I. LYMPH NODE, LEVEL 12, BIOPSY:  - Profiles of lymph node, negative for malignancy.   J. LYMPH NODE, LEVEL 12#2, BIOPSY:  - Profiles of lymph node, negative for malignancy.   K. LYMPH NODE, 4R FAT PAD, BIOPSY:  - Fibroadipose and vascular tissue, negative for malignancy.   L. LUNG, RIGHT LOWER, LOBECTOMY:  - Invasive squamous cell carcinoma, keratinizing.  - See oncology table below.    Discharge Exam: Blood pressure 129/90, pulse 95, temperature 98 F (36.7 C), temperature source Oral, resp. rate 20, height 6\' 1"  (1.854 m), weight 88.9 kg, SpO2 99 %.    General appearance: alert, cooperative, and no distress Heart: regular rate and rhythm Lungs: diminished breath sounds right base Abdomen: soft, non-tender; bowel sounds normal; no masses,  no organomegaly Extremities: extremities normal, atraumatic, no cyanosis or edema Wound: clean and dry  Discharge disposition: 01-Home or Self Care  Allergies as of 06/02/2021   No Known Allergies      Medication List     TAKE these medications    albuterol 108 (90 Base) MCG/ACT inhaler Commonly known as: VENTOLIN HFA Inhale 2 puffs into the lungs every 6 (six) hours as needed for wheezing or shortness of breath.   aspirin 81 MG EC tablet Take 81 mg by mouth daily.   Centrum Silver 50+Men Tabs Take 1 tablet by mouth daily.   ciprofloxacin 500 MG tablet Commonly known as: Cipro Take 1 tablet (500 mg total) by mouth 2 (two) times daily for 10 days.   gabapentin 100 MG capsule Commonly known as: NEURONTIN Take 1 capsule (100 mg total) by mouth 2 (two) times daily.   ibuprofen 200 MG tablet Commonly known as: ADVIL Take 2 tablets (400 mg total) by mouth  every 6 (six) hours as needed for mild pain.   lisinopril 10 MG tablet Commonly known as: ZESTRIL Take 10 mg by mouth daily.   metFORMIN 500 MG tablet Commonly known as: GLUCOPHAGE Take 1,000 mg by mouth daily with breakfast.   oxyCODONE 5 MG immediate release tablet Commonly known as: Oxy IR/ROXICODONE Take 1 tablet (5 mg total) by mouth every 4 (four) hours as needed for moderate pain.   tamsulosin 0.4 MG Caps capsule Commonly known as: FLOMAX Take 1 capsule (0.4 mg total) by mouth daily.   Trelegy Ellipta 100-62.5-25 MCG/ACT Aepb Generic drug: Fluticasone-Umeclidin-Vilant  Inhale 1 puff into the lungs daily.   varenicline 0.5 MG X 11 & 1 MG X 42 tablet Commonly known as: CHANTIX PAK Take one 0.5 mg tablet by mouth once daily for 3 days, then increase to one 0.5 mg tablet twice daily for 4 days, then increase to one 1 mg tablet twice daily.   varenicline 1 MG tablet Commonly known as: Chantix Continuing Month Pak Take 1 tablet (1 mg total) by mouth 2 (two) times daily.        Follow-up Information     Melrose Nakayama, MD. Go on 06/14/2021.   Specialty: Cardiothoracic Surgery Why: Your appointment is at 3:45 PM.  Please arrive 30 minutes early for chest x-ray to be performed by [facility] located on the first floor of the same building. Contact information: [address] [place] [address] [phone]                 Signed: Ellwood Handler, PA-C 06/02/2021, 9:32 AM

## 2021-05-20 NOTE — Hospital Course (Addendum)
History of Present Illness:  Stephen Diaz is a 65 year old man with a history of tobacco abuse, COPD, type 2 diabetes, hypertension, arthritis, and right foot drop.  He recently had a low-dose CT for lung cancer screening.  It showed a cavitary lesion in the right lower lobe had increased in size significantly compared to the scan from a year prior.  It initially was 1.5 cm and thin-walled.  It now measures 2.8 cm and there was variable wall thickness up to 4 mm in some areas.  There was some borderline adenopathy.  He was referred to Dr. Verlee Monte.  A PET/CT showed this nodule was intensely hypermetabolic with an SUV greater than 6.  He was evaluated by Dr. Roxan Hockey at which time he admitted to smoking about a pack and a half of cigarettes daily for over 40 years.  He recently has cut back to 3 to 4 cigarettes a day.  He feels like the Chantix is helping.  He gets short of breath when he carries things like his fishing gear.  He denies any chest pain, pressure, or tightness.  Uses his rescue inhaler a couple of times a week.  He can walk up more than 2 flights of stairs.  No change in appetite or weight loss.  No unusual headaches or visual changes.  He can walk about three fourths of a mile before having to stop due to cramping in his left calf.  It was felt surgical resection would be indicated.  This could be done robotically.  Due to the size of the nodule the patient would require a lobectomy.  The risks and benefits of the procedure were explained to the patient and he was agreeable to proceed.  Hospital Course:  Stephen Diaz presented to [facility] on 05/20/2021.  He was taken to the operating room and underwent Robotic Assisted Right Lower Lobectomy with intercostal nerve block and lymph node dissection.  He tolerated the procedure without difficulty and was taken to the PACU in stable condition and later moved to [facility] progressive care.  He initially did not have an air leak in the operating  room so the chest tube was left to waterseal.  On the next morning, he was noted to have a significant air leak as well as a left apical space on chest x-ray.  The chest tube was placed to suction.  Later that same day, he developed some respiratory distress.  This was felt to be related to volume overload.  He was diuresed.  He was given breathing treatments and symptoms improved.  The patient's dyspnea worsened and was ultimately transferred to the ICU for further monitoring and pulmonary consult was obtained.  They adjusted patient's inhaler and bronchodilator regimen.  He developed urinary retention which required I/O catheterization.  His respiratory status improved and he was felt stable for transfer to the telemetry unit on 05/24/2021.  His air leak improved and his chest tube was transitioned to water seal.  Follow up CXR showed stable basilar pneumothorax and subcutaneous emphysema.  The patient's air leak persisted.  He did not want to be discharged home with a chest tube.  He was been resumed on his home regimen of Metformin. The patient's air leak did not resolve.  His basilar space persisted.  The patient did not want to be discharged home with a chest tube in place.  Due to this we placed his chest tube back to water seal on 05/27/2021.  This remained on suction for 48  hours.  Patient had no air leak the am of 12/19. Chest xray showed right fluid filling basilar space. Chest tube was placed to water seal. Follow up chest x ray showed basilar component had filled up with fluid.  There was a tiny apical space that persisted.  His chest tube was placed back on water seal.  Follow up CXR remained stable.  His chest tube was removed on 05/31/2021.  F/U CXR showed no evidence of pneumothorax.  The patient developed hypotension with SBP dropping into the 60-70s.  He was treated with IV fluids with some response.  Repeat labs were obtained and showed no evidence of blood loss, however he was noted to have a  leukocytosis of 24K.  He was not febrile.  Expectorated sputum culture was obtained and the patient was started on Vanc and Zosyn for presumed Hospital acquired pneumonia.  The patient's sputum culture grew Enterobacter Aerogenes.  Due to this he was transitioned to Ceftriaxone for additional coverage.  Final sensitivities showed susceptibility to Ciprofloxacin and he will be given a 10 day course.  His blood pressure has improved and he will resume Lisinopril in a few days.  His surgical incisions show no evidence of infection.  There is some mild erythema from suture irritation.  He is medically stable for discharge home today.

## 2021-05-20 NOTE — Anesthesia Procedure Notes (Signed)
Procedure Name: Intubation Date/Time: 05/20/2021 11:47 AM Performed by: Lorie Phenix, CRNA Pre-anesthesia Checklist: Patient identified, Emergency Drugs available, Suction available and Patient being monitored Patient Re-evaluated:Patient Re-evaluated prior to induction Oxygen Delivery Method: Circle system utilized Preoxygenation: Pre-oxygenation with 100% oxygen Induction Type: IV induction Ventilation: Mask ventilation without difficulty Laryngoscope Size: Mac and 4 Grade View: Grade I Tube type: Oral Endobronchial tube: Double lumen EBT, Left and EBT position confirmed by fiberoptic bronchoscope and 39 Fr Number of attempts: 1 Airway Equipment and Method: Stylet, Oral airway and Fiberoptic brochoscope Placement Confirmation: ETT inserted through vocal cords under direct vision, positive ETCO2 and breath sounds checked- equal and bilateral Secured at: 31 cm Tube secured with: Tape Dental Injury: Teeth and Oropharynx as per pre-operative assessment

## 2021-05-20 NOTE — Anesthesia Procedure Notes (Signed)
Arterial Line Insertion Start/End12/02/2021 2:19 PM Performed by: Albertha Ghee, MD, anesthesiologist  Patient location: Pre-op. Preanesthetic checklist: patient identified, IV checked, site marked, risks and benefits discussed, surgical consent, monitors and equipment checked, pre-op evaluation, timeout performed and anesthesia consent Lidocaine 1% used for infiltration Right, radial was placed Catheter size: 20 G Hand hygiene performed  and maximum sterile barriers used   Attempts: 2 Procedure performed using ultrasound guided technique. Ultrasound Notes:anatomy identified, needle tip was noted to be adjacent to the nerve/plexus identified and no ultrasound evidence of intravascular and/or intraneural injection Following insertion, dressing applied and Biopatch. Post procedure assessment: normal  Patient tolerated the procedure well with no immediate complications.

## 2021-05-20 NOTE — Anesthesia Preprocedure Evaluation (Signed)
Anesthesia Evaluation  Patient identified by MRN, date of birth, ID band Patient awake    Reviewed: Allergy & Precautions, H&P , NPO status , Patient's Chart, lab work & pertinent test results  Airway Mallampati: II   Neck ROM: full    Dental   Pulmonary COPD, Current Smoker and Patient abstained from smoking.,    breath sounds clear to auscultation       Cardiovascular hypertension,  Rhythm:regular Rate:Normal     Neuro/Psych    GI/Hepatic   Endo/Other  diabetes, Type 2  Renal/GU      Musculoskeletal  (+) Arthritis ,   Abdominal   Peds  Hematology   Anesthesia Other Findings   Reproductive/Obstetrics                             Anesthesia Physical Anesthesia Plan  ASA: 3  Anesthesia Plan: General   Post-op Pain Management:    Induction: Intravenous  PONV Risk Score and Plan: 1 and Ondansetron, Dexamethasone, Treatment may vary due to age or medical condition and Midazolam  Airway Management Planned: Double Lumen EBT  Additional Equipment: Arterial line  Intra-op Plan:   Post-operative Plan: Extubation in OR  Informed Consent: I have reviewed the patients History and Physical, chart, labs and discussed the procedure including the risks, benefits and alternatives for the proposed anesthesia with the patient or authorized representative who has indicated his/her understanding and acceptance.     Dental advisory given  Plan Discussed with: CRNA, Anesthesiologist and Surgeon  Anesthesia Plan Comments:         Anesthesia Quick Evaluation

## 2021-05-20 NOTE — Interval H&P Note (Signed)
History and Physical Interval Note:  05/20/2021 10:52 AM  Stephen Diaz  has presented today for surgery, with the diagnosis of CAVITARY RLL MASS.  The various methods of treatment have been discussed with the patient and family. After consideration of risks, benefits and other options for treatment, the patient has consented to  Procedure(s): XI ROBOTIC ASSISTED THORASCOPY-RIGHT LOWER LOBECTOMY (Right) as a surgical intervention.  The patient's history has been reviewed, patient examined, no change in status, stable for surgery.  I have reviewed the patient's chart and labs.  Questions were answered to the patient's satisfaction.     Melrose Nakayama

## 2021-05-20 NOTE — Transfer of Care (Signed)
Immediate Anesthesia Transfer of Care Note  Patient: Stephen Diaz  Procedure(s) Performed: XI ROBOTIC ASSISTED THORASCOPY-RIGHT LOWER LOBECTOMY (Right: Chest) LYMPH NODE DISSECTION (Right: Chest) INTERCOSTAL NERVE BLOCK (Right: Chest)  Patient Location: PACU  Anesthesia Type:General  Level of Consciousness: awake, alert  and oriented  Airway & Oxygen Therapy: Patient Spontanous Breathing and Patient connected to face mask oxygen  Post-op Assessment: Report given to RN and Post -op Vital signs reviewed and stable  Post vital signs: Reviewed and stable  Last Vitals:  Vitals Value Taken Time  BP 148/98 05/20/21 1541  Temp    Pulse 89 05/20/21 1547  Resp 14 05/20/21 1547  SpO2 100 % 05/20/21 1547  Vitals shown include unvalidated device data.  Last Pain:  Vitals:   05/20/21 1039  PainSc: 0-No pain         Complications: No notable events documented.

## 2021-05-20 NOTE — Brief Op Note (Addendum)
05/20/2021  2:58 PM  PATIENT:  Stephen Diaz  65 y.o. male  PRE-OPERATIVE DIAGNOSIS:  CAVITARY RLL MASS  POST-OPERATIVE DIAGNOSIS:  NON-SMALL CELL CARCINOMA RIGHT LOWER LOBE, CLINICAL STAGE IA (T1,N0)  PROCEDURE:  Procedure(s):  XI ROBOTIC ASSISTED THORACOSCOPY RIGHT LOWER LOBECTOMY (Right) NODE DISSECTION (Right) INTERCOSTAL NERVE BLOCK LEVELS 3-10  SURGEON:  Surgeon(s) and Role:    * Melrose Nakayama, MD - Primary  PHYSICIAN ASSISTANT: Ellwood Handler PA-C  ASSISTANTS: none   ANESTHESIA:   general  EBL:  125 mL   BLOOD ADMINISTERED:none  DRAINS:  28 [address] Drain    LOCAL MEDICATIONS USED:  Exparel  SPECIMEN:  Source of Specimen:  Lymph Nodes, Right Lower Lobe  DISPOSITION OF SPECIMEN:  PATHOLOGY  COUNTS:  YES  TOURNIQUET:  * No tourniquets in log *  DICTATION: .Dragon Dictation  PLAN OF CARE: Admit to inpatient   PATIENT DISPOSITION:  PACU - hemodynamically stable.   Delay start of Pharmacological VTE agent (>24hrs) due to surgical blood loss or risk of bleeding: no

## 2021-05-21 ENCOUNTER — Inpatient Hospital Stay (HOSPITAL_COMMUNITY): Payer: Medicare Other

## 2021-05-21 LAB — BASIC METABOLIC PANEL
Anion gap: 9 (ref 5–15)
BUN: 12 mg/dL (ref 8–23)
CO2: 24 mmol/L (ref 22–32)
Calcium: 8.3 mg/dL — ABNORMAL LOW (ref 8.9–10.3)
Chloride: 98 mmol/L (ref 98–111)
Creatinine, Ser: 0.79 mg/dL (ref 0.61–1.24)
GFR, Estimated: >60 mL/min (ref >60)
Glucose, Bld: 155 mg/dL — ABNORMAL HIGH (ref 70–99)
Potassium: 4.2 mmol/L (ref 3.5–5.1)
Sodium: 131 mmol/L — ABNORMAL LOW (ref 135–145)

## 2021-05-21 LAB — CBC
HCT: 39.1 % (ref 39.0–52.0)
Hemoglobin: 12.9 g/dL — ABNORMAL LOW (ref 13.0–17.0)
MCH: 28.4 pg (ref 26.0–34.0)
MCHC: 33 g/dL (ref 30.0–36.0)
MCV: 86.1 fL (ref 80.0–100.0)
Platelets: 277 10*3/uL (ref 150–400)
RBC: 4.54 MIL/uL (ref 4.22–5.81)
RDW: 13.2 % (ref 11.5–15.5)
WBC: 15.2 10*3/uL — ABNORMAL HIGH (ref 4.0–10.5)
nRBC: 0 % (ref 0.0–0.2)

## 2021-05-21 LAB — GLUCOSE, CAPILLARY
Glucose-Capillary: 132 mg/dL — ABNORMAL HIGH (ref 70–99)
Glucose-Capillary: 141 mg/dL — ABNORMAL HIGH (ref 70–99)
Glucose-Capillary: 94 mg/dL (ref 70–99)
Glucose-Capillary: 151 mg/dL — ABNORMAL HIGH (ref 70–99)
Glucose-Capillary: 163 mg/dL — ABNORMAL HIGH (ref 70–99)

## 2021-05-21 IMAGING — DX DG CHEST 1V PORT
1 series · 1 of 1 positions shown · non-contrast
Comparison: Earlier radiograph dated [DATE].

CLINICAL DATA: Shortness of breath.  Status post recent lobectomy.

EXAM:
PORTABLE CHEST 1 VIEW

[chest]
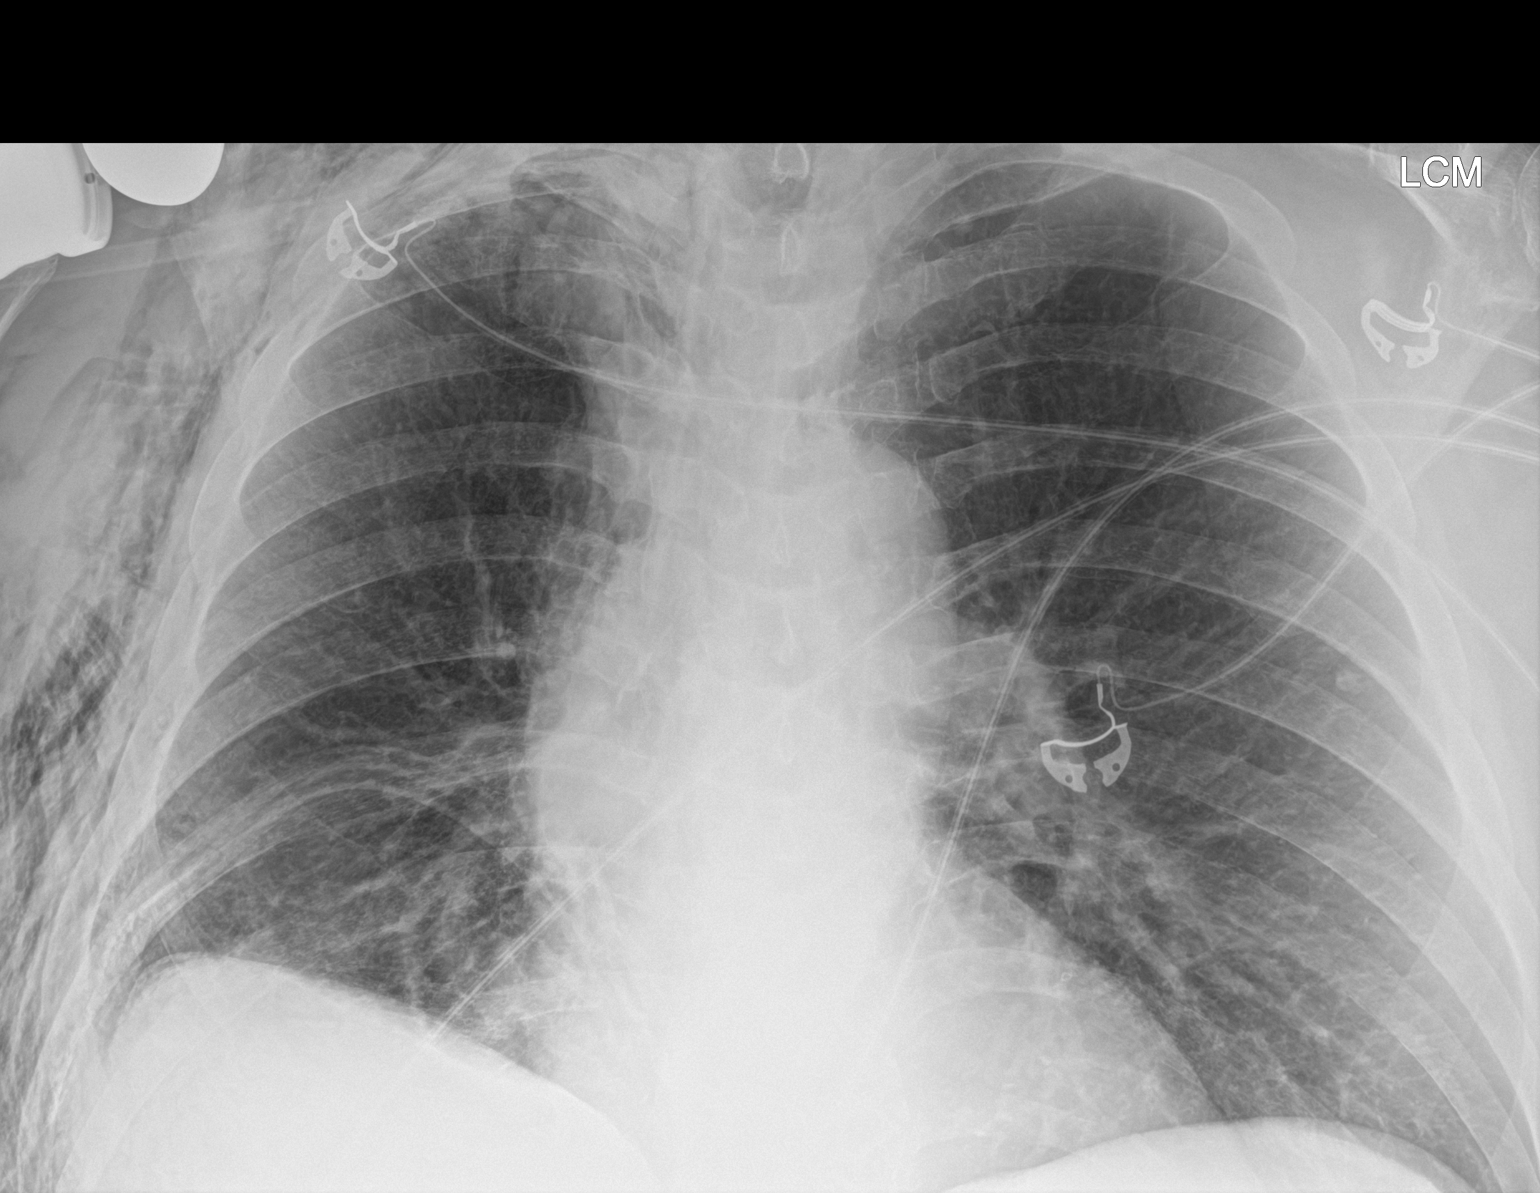

[1 of 1 positions shown; findings below may reference images not displayed]

FINDINGS: No focal consolidation or pleural effusion. No large pneumothorax.
Evaluation for pneumothorax is however limited due to extensive
subcutaneous emphysema of the right chest wall. Overall similar
appearance of the lungs and chest wall emphysema as the prior
radiograph. Stable cardiomediastinal silhouette. Right shoulder
arthroplasty. No acute osseous pathology.
IMPRESSION: No interval change.

## 2021-05-21 IMAGING — DX DG CHEST 1V PORT
1 series · 1 of 1 positions shown · non-contrast
Comparison: [DATE]

CLINICAL DATA: Postop lobectomy

EXAM:
PORTABLE CHEST 1 VIEW

[chest]
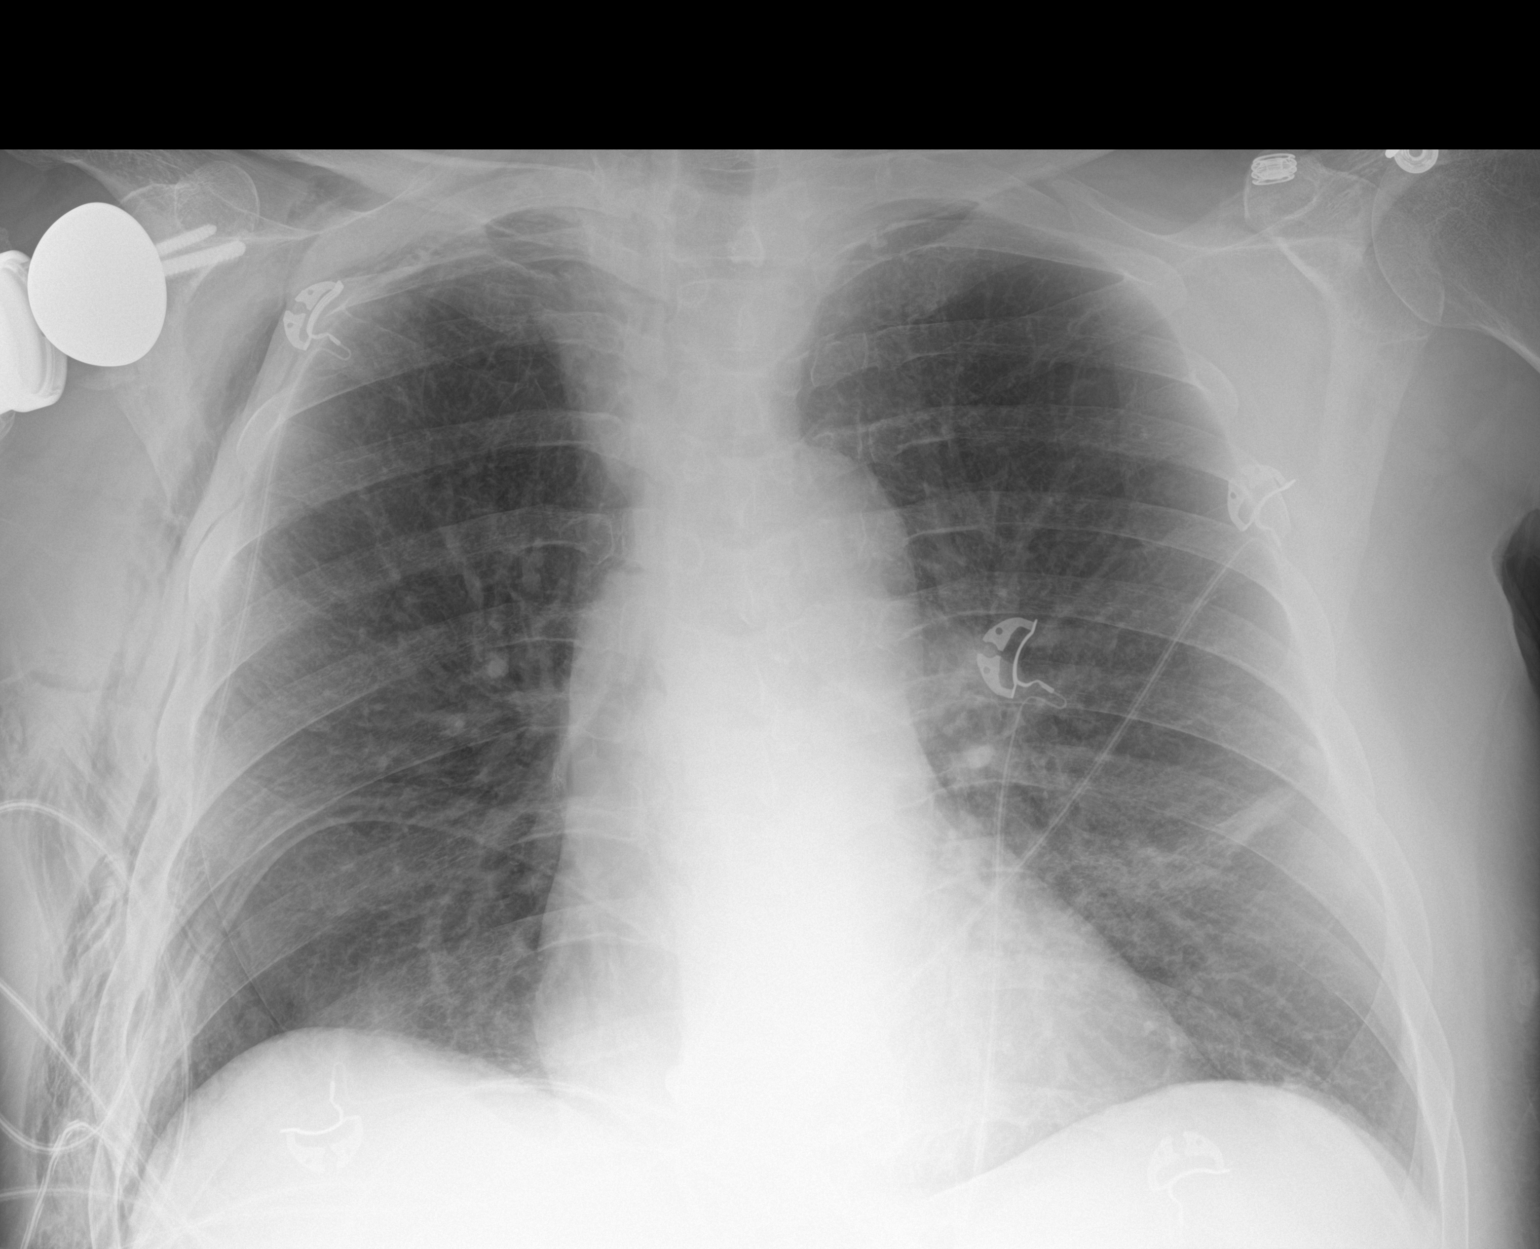

[1 of 1 positions shown; findings below may reference images not displayed]

FINDINGS: Cardiac and mediastinal contours normal. Right chest tube in place.
Right basilar pneumothorax with slight improvement. Progression of
subcutaneous gas in the right chest wall.

No change mild right lower lobe atelectasis or infiltrate. No right
effusion.

Mild left lower lobe airspace disease with progression.
IMPRESSION: Right basilar pneumothorax with slight improvement. Progression of
subcutaneous emphysema in the right chest wall.

Mild left lower lobe airspace disease with progression.

## 2021-05-21 IMAGING — DX DG CHEST 1V PORT
1 series · 1 of 1 positions shown · non-contrast
Comparison: [DATE] at [JZ] hours

CLINICAL DATA: Status post lobectomy, respiratory distress

EXAM:
PORTABLE CHEST 1 VIEW

[chest]
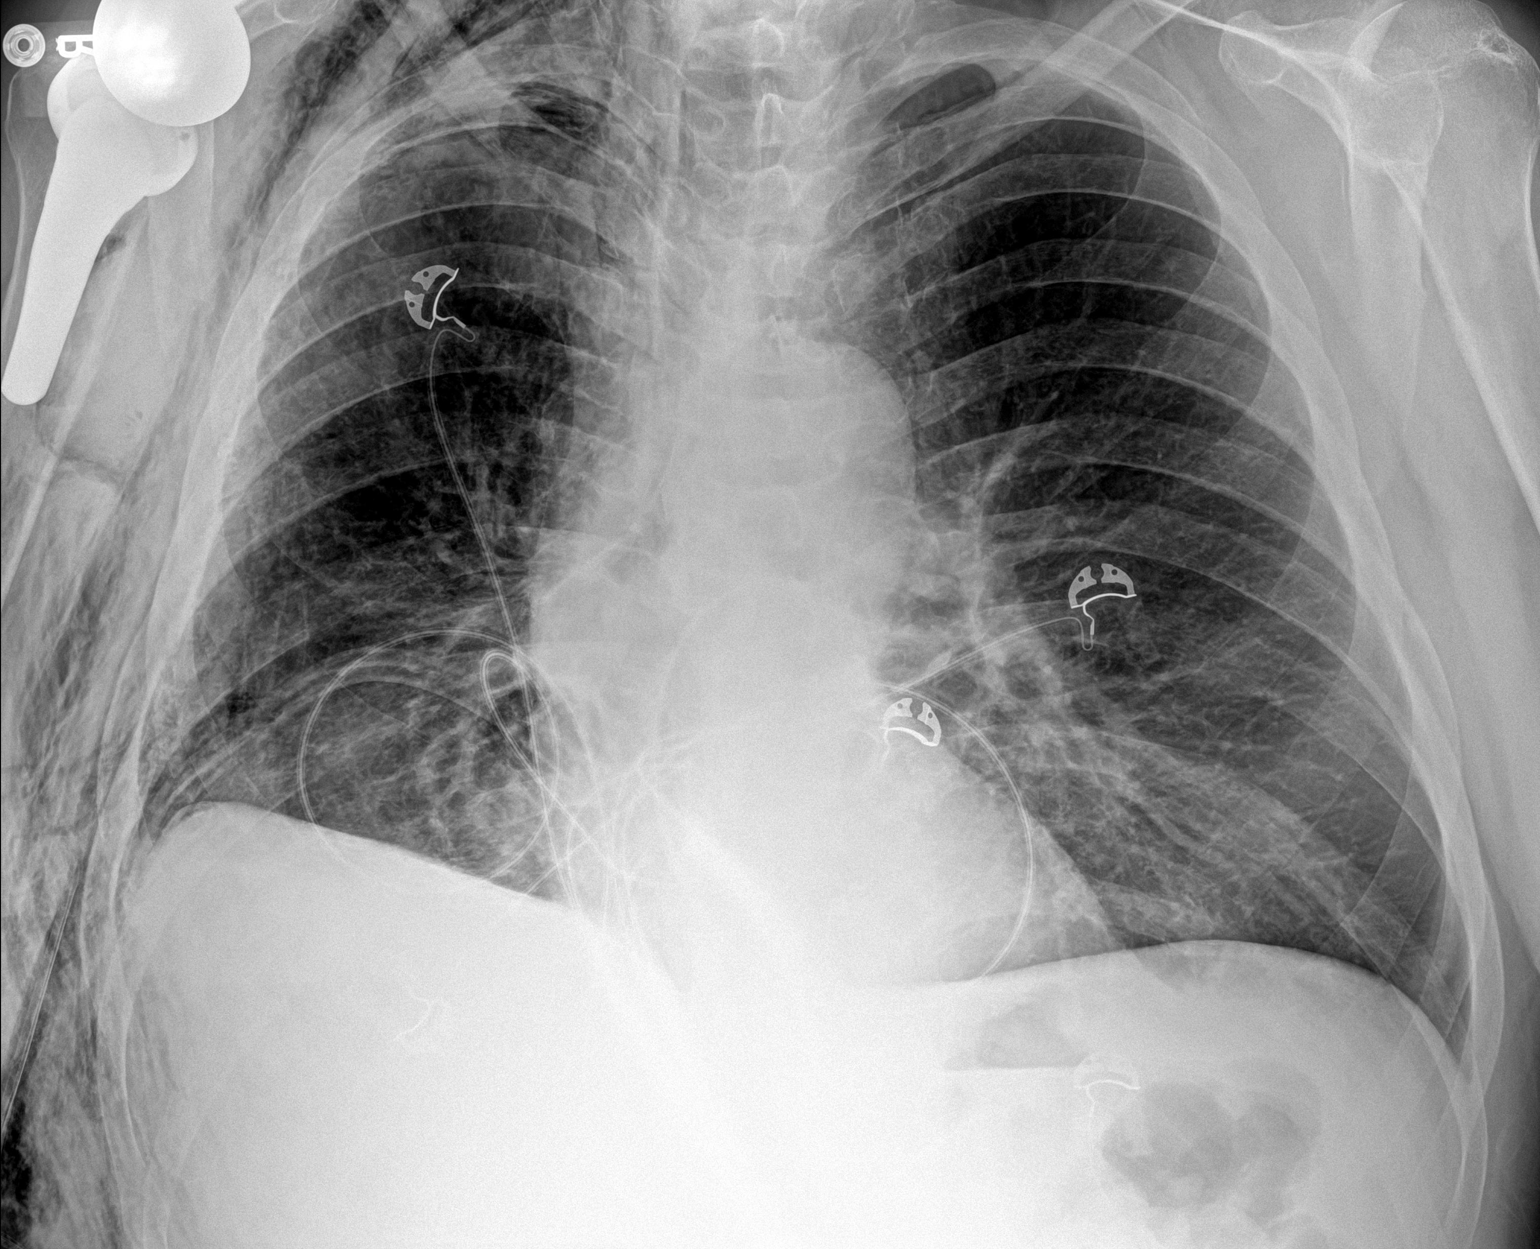

[1 of 1 positions shown; findings below may reference images not displayed]

FINDINGS: Postsurgical changes with volume loss in the right hemithorax. The
trace right pneumothorax on the prior study is not well visualized,
possibly obscured by progressive subcutaneous emphysema along the
right lateral chest wall.

Left lung is essentially clear.

The heart is normal in size.

Right shoulder arthroplasty.
IMPRESSION: Postsurgical changes with volume loss in the right hemithorax.

Prior trace right pneumothorax is not well visualized, possibly
obscured by progressive subcutaneous emphysema along the right
lateral chest wall.

## 2021-05-21 MED ORDER — METHYLPREDNISOLONE SODIUM SUCC 40 MG IJ SOLR
40.0000 mg | Freq: Once | INTRAMUSCULAR | Status: AC
  Administered 2021-05-21: 40 mg via INTRAVENOUS
  Filled 2021-05-21: qty 1

## 2021-05-21 MED ORDER — INSULIN ASPART 100 UNIT/ML IJ SOLN
0.0000 [IU] | Freq: Three times a day (TID) | INTRAMUSCULAR | Status: DC
  Administered 2021-05-21: 18:00:00 3 [IU] via SUBCUTANEOUS

## 2021-05-21 MED ORDER — ALBUTEROL SULFATE (2.5 MG/3ML) 0.083% IN NEBU
2.5000 mg | INHALATION_SOLUTION | RESPIRATORY_TRACT | Status: DC | PRN
  Administered 2021-05-22 (×2): 2.5 mg via RESPIRATORY_TRACT
  Filled 2021-05-21 (×2): qty 3

## 2021-05-21 MED ORDER — METFORMIN HCL 500 MG PO TABS
1000.0000 mg | ORAL_TABLET | Freq: Every day | ORAL | Status: DC
  Administered 2021-05-22 – 2021-06-02 (×11): 1000 mg via ORAL
  Filled 2021-05-21 (×12): qty 2

## 2021-05-21 MED ORDER — MORPHINE SULFATE (PF) 2 MG/ML IV SOLN
2.0000 mg | INTRAVENOUS | Status: DC | PRN
  Administered 2021-05-22 – 2021-05-24 (×13): 2 mg via INTRAVENOUS
  Filled 2021-05-21 (×13): qty 1

## 2021-05-21 MED ORDER — GUAIFENESIN ER 600 MG PO TB12
1200.0000 mg | ORAL_TABLET | Freq: Two times a day (BID) | ORAL | Status: AC
  Administered 2021-05-21 – 2021-05-26 (×10): 1200 mg via ORAL
  Filled 2021-05-21 (×10): qty 2

## 2021-05-21 MED ORDER — PANTOPRAZOLE SODIUM 40 MG PO TBEC
40.0000 mg | DELAYED_RELEASE_TABLET | Freq: Every day | ORAL | Status: DC
  Administered 2021-05-21 – 2021-06-02 (×13): 40 mg via ORAL
  Filled 2021-05-21 (×13): qty 1

## 2021-05-21 MED ORDER — ALUM & MAG HYDROXIDE-SIMETH 200-200-20 MG/5ML PO SUSP
30.0000 mL | Freq: Once | ORAL | Status: AC
  Administered 2021-05-21: 30 mL via ORAL
  Filled 2021-05-21: qty 30

## 2021-05-21 MED ORDER — FUROSEMIDE 10 MG/ML IJ SOLN
40.0000 mg | Freq: Once | INTRAMUSCULAR | Status: AC
  Administered 2021-05-21: 40 mg via INTRAVENOUS
  Filled 2021-05-21: qty 4

## 2021-05-21 MED ORDER — CHLORHEXIDINE GLUCONATE CLOTH 2 % EX PADS
6.0000 | MEDICATED_PAD | Freq: Every day | CUTANEOUS | Status: DC
  Administered 2021-05-21 – 2021-05-23 (×3): 6 via TOPICAL

## 2021-05-21 NOTE — Progress Notes (Signed)
      [address]       [place] [address]             [phone]      CTSP for wheezing, pain  He is wheezing R> L, just received albuterol  He is convinced it is due to the neb he was given this morning  Possibly a component of volume overload- unfortunately I/O unreliable.  Will give IV Lasix  On Toradol and acetaminophen for pain with PRN tramadol, morphine and oxycodone. Looks like he has only gotten tramadol which has been ineffective.  Dc tramadol. Use oxycodone and morphine PRN for pain.  Revonda Standard Roxan Hockey, MD [facility] [phone]

## 2021-05-21 NOTE — Op Note (Signed)
NAMELAVAL, Stephen Diaz MEDICAL RECORD NO: [id number] ACCOUNT NO: [id number] DATE OF BIRTH: Dec 02, 1955 FACILITY: [facility] LOCATION: [facility] PHYSICIAN: Revonda Standard. Roxan Hockey, MD  Operative Report   DATE OF PROCEDURE: 05/20/2021  PREOPERATIVE DIAGNOSIS:  Right lower lobe lung nodule.  POSTOPERATIVE DIAGNOSIS:  Non-small cell carcinoma, right lower lobe, clinical stage IA (T1, N0).  PROCEDURE:   Xi robotic-assisted right lower lobectomy, Lymph node dissection,  Intercostal nerve blocks levels 3 through 10.  SURGEON:  Revonda Standard. Roxan Hockey, MD  ASSISTANT:  Ellwood Handler, PA  ANESTHESIA:  General.  FINDINGS:  Frozen section revealed non-small cell carcinoma, probable squamous cell.  Bronchial margin free of tumor.  CLINICAL NOTE:  Stephen Diaz is a 65 year old man with a history of tobacco abuse, who was being followed with low dose CT for lung cancer screening.  He was noted to have a cavitary right lower lobe lesion a year previously.  Over the year, the nodule had  increased in size and become more suspicious looking with variable wall thickness.  On PET/CT, the nodule was markedly hypermetabolic.  The patient was advised to undergo resection for definitive diagnosis and treatment.  The indications, risks,  benefits, and alternatives were discussed in detail with the patient.  He understood and accepted the risks and agreed to proceed.  OPERATIVE NOTE:  Stephen Diaz was brought to the preoperative holding area on 05/20/2021. The anesthesia service placed an arterial blood pressure monitoring line and established intravenous access.  He was taken to the operating room, anesthetized, and  intubated with a double lumen endotracheal tube.  Intravenous antibiotics were administered.  A Foley catheter was placed and sequential compression devices were placed on the calves for DVT prophylaxis.  He was placed in a left lateral decubitus  position.  A Bair Hugger was placed for active warming.  The  right chest was prepped and draped in the usual sterile fashion.  A solution containing 20 mL of liposomal bupivacaine, 30 mL of 0.5% bupivacaine and 50 mL of saline was prepared.  This solution was used for local at the incision sites as well as for the intercostal nerve blocks.  A timeout was performed.  An incision was made in the eighth interspace in the mid axillary line.  An 8 mm port was inserted.  The thoracoscope was advanced into the chest.  After confirming intrapleural placement, carbon dioxide was insufflated per protocol.  A 12 mm port was placed in the eighth interspace anterior to the camera port and a 12 mm AirSeal port was placed posteriorly in the tenth interspace.   Intercostal nerve blocks were performed from the third to the tenth interspace.  10 mL of the bupivacaine solution was injected into a subpleural plane at each level.  Two additional eighth interspace ports were placed.  Before the robot could be docked,  the Anesthesiology Service was having difficulty with the endotracheal tube.  They were able to reposition it to an appropriate site and the drapes did not have to be taken down.  The robotic ports and the AirSeal port were reinserted.  The robot was  deployed.  The camera arm was docked.  Targeting was performed.  The remaining robotic arms were docked.  Robotic instruments were inserted with thoracoscopic visualization.  There was good isolation of the right lung.  The lower lobe was retracted superiorly.  The inferior ligament was divided with bipolar cautery.  All lymph nodes that were encountered during the dissection were removed and sent as separate specimens  for permanent pathology.  Level 8 and level 9 nodes were removed.  The pleural reflection was divided at the hilum posteriorly and there were multiple enlarged level 7 nodes.  These were sent as separate specimens.  There was a rather large bronchial arterial vessel feeding the most superior of those nodes.  The   level 11 node was dissected out at the bifurcation of the upper lobe and bronchus and bronchus intermedius.  This was enlarged, but otherwise benign-appearing. The upper lobe was retracted inferiorly.  A level 10 node was removed.  Pleura was then  divided superiorly to the azygos vein.  Dissection in this area resulted in some venous bleeding, which was controlled with clips.  No definitive 4R node was ever identified.  There was a relatively small fat pad, which was removed and this was sent to  pathology.  The fissure was inspected.  The fissure was incomplete posteriorly.  Anteriorly, there was a visible basilar segmental branch of the lower lobe.  Dissection was started in this area and the pleura was incised with bipolar cautery.  The middle lobe and  lower lobe were able to be separated just using bipolar cautery.  No stapling was required.  A plane then was developed over the basilar artery and working posteriorly, the superior segmental artery was identified.  Multiple firings of the robotic  stapler completed the major fissure working posteriorly.  Again, enlarged, but otherwise benign-appearing nodes were removed from levels 11 and 12 during the dissection. The superior segmental branch was dissected out and encircled and divided with  the vascular stapler.  And then the remaining basilar segmental trunk was divided with the vascular stapler.  The inferior pulmonary vein then was divided.  Finally, the stapler was placed across the right lower lobe bronchus.  A green cartridge was used  for the bronchial closure. The stapler was closed.  A test inflation showed good aeration of the upper and middle lobes.  The stapler then was fired transecting the lower lobe bronchus.  The vessel loop and the majority of the sponges that had been placed were removed.  The remaining sponges were removed after undocking the robot.  The chest was copiously irrigated with warm saline.  A test inflation to 30 cm  of water pressure showed no  significant air leakage.  The robotic instruments were removed.  The robot was undocked.  The anterior eighth interspace incision was extended to approximately 3 cm.  A 15 mm endoscopic retrieval bag was placed into the chest and the lower lobe was manipulated into the bag, which was then removed.  The remaining sponges were removed.  The chest  was again copiously irrigated with warm saline.  The port sites were inspected and there was no significant bleeding.  A 28-French Blake drain was placed through the original port incision and secured with a #1 silk suture was directed to the apex.  Dual lung ventilation was resumed.  The incisions were closed in standard fashion.  The chest tube was placed to a Pleur-Evac on waterseal.  After the frozen section returned showing non-small cell carcinoma with a negative bronchial margin, the patient  was placed back in a supine position.  He was extubated in the operating room and taken to the postanesthetic care unit in good condition.  All sponge, needle and instrument counts were correct at the end of the procedure.  An experienced assistant was necessary for this case due to the complexity.  Ellwood Handler, PA  performed this role.  She placed some of the ports, provided suction and retraction, exchanged instruments and also participated in wound closure.   NIK D: 05/20/2021 6:18:24 pm T: 05/21/2021 4:04:00 am  JOB: [id number]/ [id number]

## 2021-05-21 NOTE — Significant Event (Signed)
Rapid Response Event Note   Reason for Call :  dyspnea  Initial Focused Assessment:  Called by the RN because he was having new onset of dyspnea. This patient had a right sided lobectomy and subsequent right sided chest tube placement. The patient was sitting on the side of the bed, using accessory muscles, and almost in the tripod position. His vitals look okay, but he has pronounced dyspnea. He denies any other symptoms associated with the dyspnea. He also is demonstrating pursed lip breathing.   BP 107/77 HR 100 RR 22 O2 93 (6L [place])    Interventions:  Lasix given Chest tube redressed  Plan of Care:  Dr. Roxan Hockey is bedside and wants the patient to be transferred to an ICU for closer monitoring.   Event Summary:   MD Notified:  Call Time: Arrival Time: End Time:  Venetia Maxon, RN

## 2021-05-21 NOTE — Progress Notes (Signed)
Pt CBG reads 151 at 16:20

## 2021-05-21 NOTE — Plan of Care (Signed)
  Problem: Education: Goal: Knowledge of General Education information will improve Description: Including pain rating scale, medication(s)/side effects and non-pharmacologic comfort measures Outcome: Progressing   Problem: Health Behavior/Discharge Planning: Goal: Ability to manage health-related needs will improve Outcome: Progressing   Problem: Clinical Measurements: Goal: Ability to maintain clinical measurements within normal limits will improve Outcome: Progressing Goal: Will remain free from infection Outcome: Progressing Goal: Diagnostic test results will improve Outcome: Progressing Goal: Respiratory complications will improve Outcome: Progressing Goal: Cardiovascular complication will be avoided Outcome: Progressing   Problem: Activity: Goal: Risk for activity intolerance will decrease Outcome: Progressing   Problem: Nutrition: Goal: Adequate nutrition will be maintained Outcome: Progressing   Problem: Coping: Goal: Level of anxiety will decrease Outcome: Progressing   Problem: Pain Managment: Goal: General experience of comfort will improve Outcome: Progressing   Problem: Safety: Goal: Ability to remain free from injury will improve Outcome: Progressing   Problem: Skin Integrity: Goal: Risk for impaired skin integrity will decrease Outcome: Progressing   Problem: Elimination: Goal: Will not experience complications related to urinary retention Outcome: Not Progressing  Required in and out catheterization d/t inability to void after lasix IV, and bladder scan of > 500 ml

## 2021-05-21 NOTE — Consult Note (Signed)
NAME:  Stephen Diaz, MRN:  [id number], DOB:  17-Jan-1956, LOS: 1 ADMISSION DATE:  05/20/2021, CONSULTATION DATE:  05/21/2020 REFERRING MD: Dr. Gorden Harms, CHIEF COMPLAINT: Increasing shortness of breath  History of Present Illness:  This is a 65 year old male that was admitted on 12/01 for a previously diagnosed cavitary right lung mass.  Patient was scheduled for robotic assisted thoracoscopy and lobectomy.  Procedure went well and transferred to the surgical floor on 05/20/2021.  Right-sided chest tube was left in place.  Today the patient developed increasing respiratory distress and wheezing.  Patient was transferred to the intensive care unit for further work-up.  During the period of respiratory distress patient had right sided chest pain that radiated to the substernal region.  That is now resolved.  He also states that his respiratory distress/shortness of breath is significantly improved.  He denies any nausea or vomiting.  Pertinent  Medical History  COPD Diabetes mellitus Hypertension Right foot drop Arthritis  Significant Hospital Events: Including procedures, antibiotic start and stop dates in addition to other pertinent events   Robotic assisted thoracoscopy with right lower lobe lobectomy, lymph node dissection and intercostal nerve block  Interim History / Subjective:  Developed increasing wheezing this afternoon.    Objective   Blood pressure 119/79, pulse 90, temperature 97.8 F (36.6 C), temperature source Axillary, resp. rate (!) 23, SpO2 94 %.        Intake/Output Summary (Last 24 hours) at 05/21/2021 2229 Last data filed at 05/21/2021 2200 Gross per 24 hour  Intake 1834.53 ml  Output 1000 ml  Net 834.53 ml   There were no vitals filed for this visit.  Examination: General: No acute distress HENT: Anicteric mucous membranes are moist atraumatic Lungs: Diminished breath sounds bilaterally, mild.  Few scattered wheezing bilaterally.  No accessory muscle  use. Cardiovascular: Regular rate no murmur rub or gallop Abdomen: Soft, nontender, nondistended, positive bowel sounds, no rebound/rigidity/guarding. Extremities: Distal pulse intact x4.  No cyanosis Neuro: Awake and alert.  Motor is grossly intact x4 extremities. Skin: No rash.  Assessment & Plan:  COPD exacerbation Status post right lower lobe lobectomy with lymph node dissection. Non-small cell carcinoma Diabetes mellitus  Plan: Patient was transferred to the intensive care unit due to respiratory distress.  He had significant wheezing.  He had received a dose of Solu-Medrol and albuterol nebulized.  He is significantly improved.  We will hold further Solu-Medrol at this time due to the persistent air leak in the chest tube.  We will use continuous neb if his wheezing gets worse again. Straight cath of 500 cc was obtained.  We will continue to closely monitor I's/O's. Chest tube has a expiratory air leak.  Currently on wall suction. Follow-up chest x-ray in the morning.  Best Practice (right click and "Reselect all SmartList Selections" daily)   Diet/type: Regular consistency (see orders) DVT prophylaxis: LMWH GI prophylaxis: PPI Lines: N/A Foley:  N/A Code Status:  full code Last date of multidisciplinary goals of care discussion []   Labs   CBC: Recent Labs  Lab 05/18/21 1430 05/21/21 0100  WBC 13.0* 15.2*  HGB 15.6 12.9*  HCT 47.4 39.1  MCV 87.5 86.1  PLT 363 856    Basic Metabolic Panel: Recent Labs  Lab 05/18/21 1430 05/21/21 0100  NA 132* 131*  K 4.3 4.2  CL 100 98  CO2 21* 24  GLUCOSE 137* 155*  BUN 15 12  CREATININE 0.72 0.79  CALCIUM 9.0 8.3*   GFR: Estimated  Creatinine Clearance: 102.6 mL/min (by C-G formula based on SCr of 0.79 mg/dL). Recent Labs  Lab 05/18/21 1430 05/21/21 0100  WBC 13.0* 15.2*    Liver Function Tests: Recent Labs  Lab 05/18/21 1430  AST 25  ALT 21  ALKPHOS 74  BILITOT 0.2*  PROT 7.2  ALBUMIN 4.1   No results  for input(s): LIPASE, AMYLASE in the last 168 hours. No results for input(s): AMMONIA in the last 168 hours.  ABG    Component Value Date/Time   PHART 7.405 05/18/2021 1450   PCO2ART 38.9 05/18/2021 1450   PO2ART 82.8 (L) 05/18/2021 1450   HCO3 23.9 05/18/2021 1450   ACIDBASEDEF 0.2 05/18/2021 1450   O2SAT 96.8 05/18/2021 1450     Coagulation Profile: Recent Labs  Lab 05/18/21 1430  INR 1.1    Cardiac Enzymes: No results for input(s): CKTOTAL, CKMB, CKMBINDEX, TROPONINI in the last 168 hours.  HbA1C: Hgb A1c MFr Bld  Date/Time Value Ref Range Status  05/18/2021 03:00 PM 6.2 (H) 4.8 - 5.6 % Final    Comment:    (NOTE) Pre diabetes:          5.7%-6.4%  Diabetes:              >6.4%  Glycemic control for   <7.0% adults with diabetes     CBG: Recent Labs  Lab 05/21/21 0401 05/21/21 0735 05/21/21 1153 05/21/21 1620 05/21/21 2131  GLUCAP 132* 141* 94 151* 163*    Review of Systems:   10 point review of systems was completed.  See HPI.  Past Medical History:  He,  has a past medical history of Arthritis, COPD (chronic obstructive pulmonary disease) ([place]), Diabetes mellitus without complication ([place]), Hypertension, and Right foot drop.   Surgical History:   Past Surgical History:  Procedure Laterality Date   CATARACT EXTRACTION Left    CATARACT EXTRACTION W/PHACO Right 08/13/2020   Procedure: CATARACT EXTRACTION PHACO AND INTRAOCULAR LENS PLACEMENT (IOC);  Surgeon: Baruch Goldmann, MD;  Location: [facility];  Service: Ophthalmology;  Laterality: Right;  CDE: 8.24   COLONOSCOPY  12/2020   Dr. Adelina Mings (general surgeon): diverticulsos and left sided colitis with erythema in the sigmoid and descending colon. bx: focal mild acute colitis ?infection vs ischemia   COLONOSCOPY  10/2009   Dr. Carlyon Prows [place]: diverticulosis, two tubular adenomas removed. descending colon erythema and mild ulcerations noted with benign biopsies.   EYE SURGERY     HERNIA REPAIR      REVERSE SHOULDER ARTHROPLASTY Right 09/08/2020   Procedure: REVERSE TOTAL SHOULDER ARTHROPLASTY;  Surgeon: Hiram Gash, MD;  Location: [facility];  Service: Orthopedics;  Laterality: Right;   screws and steel plate in neck  (W2-5)     10-12 years ago  x2     Social History:   reports that he has been smoking cigarettes. He started smoking about 49 years ago. He has a 12.50 pack-year smoking history. He has never used smokeless tobacco. He reports that he does not currently use alcohol. He reports that he does not currently use drugs.   Family History:  His family history includes Crohn's disease in his cousin. There is no history of Colon cancer.   Allergies No Known Allergies   Home Medications  Prior to Admission medications   Medication Sig Start Date End Date Taking? Authorizing Provider  albuterol (VENTOLIN HFA) 108 (90 Base) MCG/ACT inhaler Inhale 2 puffs into the lungs every 6 (six) hours as needed for wheezing  or shortness of breath.   Yes [provider]  aspirin 81 MG EC tablet Take 81 mg by mouth daily.   Yes [provider]  lisinopril (ZESTRIL) 10 MG tablet Take 10 mg by mouth daily.   Yes [provider]  metFORMIN (GLUCOPHAGE) 500 MG tablet Take 1,000 mg by mouth daily with breakfast. 05/10/20  Yes [provider]  Multiple Vitamins-Minerals (CENTRUM SILVER 50+MEN) TABS Take 1 tablet by mouth daily.   Yes [provider]  TRELEGY ELLIPTA 100-62.5-25 MCG/INH AEPB Inhale 1 puff into the lungs daily. 01/01/21  Yes [provider]  varenicline (CHANTIX PAK) 0.5 MG X 11 & 1 MG X 42 tablet Take one 0.5 mg tablet by mouth once daily for 3 days, then increase to one 0.5 mg tablet twice daily for 4 days, then increase to one 1 mg tablet twice daily. 04/21/21  Yes Maryjane Hurter, MD  varenicline (CHANTIX CONTINUING MONTH PAK) 1 MG tablet Take 1 tablet (1 mg total) by mouth 2 (two) times daily. 05/19/21   Maryjane Hurter, MD     Critical care time: 3mins

## 2021-05-21 NOTE — Progress Notes (Addendum)
[address]       [place] [address]             [phone]      1 Day Post-Op Procedure(s) (LRB): XI ROBOTIC ASSISTED THORASCOPY-RIGHT LOWER LOBECTOMY (Right) LYMPH NODE DISSECTION (Right) INTERCOSTAL NERVE BLOCK (Right) Subjective: Had an episode of shortness of breath and was "gasping" after the Breo and Incruse breathing treatments this morning.  Says pain is pretty well controlled.  Objective: Vital signs in last 24 hours: Temp:  [97 F (36.1 C)-98.2 F (36.8 C)] 98 F (36.7 C) (12/10 0700) Pulse Rate:  [81-101] 101 (12/10 0700) Cardiac Rhythm: Sinus tachycardia (12/10 0701) Resp:  [13-21] 21 (12/10 0700) BP: (106-148)/(73-98) 120/78 (12/10 0700) SpO2:  [92 %-100 %] 92 % (12/10 0746) Arterial Line BP: (112-129)/(52-63) 112/52 (12/09 1611)     Intake/Output from previous day: 12/09 0701 - 12/10 0700 In: 3102.6 [I.V.:2902.6; IV Piggyback:200] Out: 650 [Urine:525; Blood:125] Intake/Output this shift: No intake/output data recorded.  General appearance: alert, cooperative, and mild distress Neurologic: intact Heart: Regular rate and rhythm Lungs: Good air movement bilaterally, no wheezes or rales.  He has mild subcu air in his right chest laterally. Moderate air leak. CT is on water seal.  Wound: Expected bruising at the right chest incisions but they are all dry and intact.  Lab Results: Recent Labs    05/18/21 1430 05/21/21 0100  WBC 13.0* 15.2*  HGB 15.6 12.9*  HCT 47.4 39.1  PLT 363 277   BMET:  Recent Labs    05/18/21 1430 05/21/21 0100  NA 132* 131*  K 4.3 4.2  CL 100 98  CO2 21* 24  GLUCOSE 137* 155*  BUN 15 12  CREATININE 0.72 0.79  CALCIUM 9.0 8.3*    PT/INR:  Recent Labs    05/18/21 1430  LABPROT 13.9  INR 1.1   ABG    Component Value Date/Time   PHART 7.405 05/18/2021 1450   HCO3 23.9 05/18/2021 1450   ACIDBASEDEF 0.2 05/18/2021 1450   O2SAT 96.8 05/18/2021 1450   CBG (last 3)  Recent Labs     05/20/21 2352 05/21/21 0401 05/21/21 0735  GLUCAP 158* 132* 141*   EXAM: PORTABLE CHEST 1 VIEW   COMPARISON:  05/20/2021   FINDINGS: Cardiac and mediastinal contours normal. Right chest tube in place. Right basilar pneumothorax with slight improvement. Progression of subcutaneous gas in the right chest wall.   No change mild right lower lobe atelectasis or infiltrate. No right effusion.   Mild left lower lobe airspace disease with progression.   IMPRESSION: Right basilar pneumothorax with slight improvement. Progression of subcutaneous emphysema in the right chest wall.   Mild left lower lobe airspace disease with progression.     Electronically Signed   By: Franchot Gallo M.D.   On: 05/21/2021 09:43 Assessment/Plan: S/P Procedure(s) (LRB): XI ROBOTIC ASSISTED THORASCOPY-RIGHT LOWER LOBECTOMY (Right) LYMPH NODE DISSECTION (Right) INTERCOSTAL NERVE BLOCK (Right) -CV-stable sinus rhythm  -Pulm-POD1 robotic assisted right lower lobectomy for stage IA non-small cell carcinoma.  He had an episode of shortness of breath after the breathing treatment this morning and was concerned he had received the wrong medication.  I reviewed the [facility] and assured Mr. Luan Pulling he was receiving the equivalent medications to his usual Trelegy inhaler.  Encouraged him to continue working on pulmonary hygiene.  DC the IV fluid since his oral intake is satisfactory and remove Foley catheter.  Mobilize. Leave the CT to water  seal. Add I-S and flutter valve.  -Type 2 diabetes mellitus-CBGs have been less than 200 since surgery.  On metformin at home.  Continue with sliding scale insulin for now and resume metformin once his p.o. intake stabilizes  -DVT prophylaxis-on daily enoxaparin   LOS: 1 day    Antony Odea, PA-C [phone] 05/21/2021  Patient seen and examined, agree with above Has a basilar space and has tidaling + an air leak, which I think is actually pretty small just  accentuated by space Will place tube to suction for 24 hours to see if it makes any difference Ambulate  Remo Lipps C. Roxan Hockey, MD [facility] [phone]

## 2021-05-21 NOTE — Plan of Care (Signed)

## 2021-05-21 NOTE — Progress Notes (Signed)
Received pt at approx 1940 to 2H11 from [facility] for respiratory distress.   Alert, oriented, SOS with exertion, sats stable on 4 l n/c. R CT to -20 suction, 280 ml output noted, + sub q air, + tidaling, + air leak.   Settled into bed, breathing improved and able to wean down to 2 l n/c.   Oriented to room, call bell, discussed plan. Daughter, Sharyn Lull, updated via phone.

## 2021-05-22 ENCOUNTER — Inpatient Hospital Stay (HOSPITAL_COMMUNITY): Payer: Medicare Other

## 2021-05-22 DIAGNOSIS — J441 Chronic obstructive pulmonary disease with (acute) exacerbation: Secondary | ICD-10-CM | POA: Diagnosis not present

## 2021-05-22 DIAGNOSIS — Z902 Acquired absence of lung [part of]: Secondary | ICD-10-CM | POA: Diagnosis not present

## 2021-05-22 LAB — GLUCOSE, CAPILLARY
Glucose-Capillary: 182 mg/dL — ABNORMAL HIGH (ref 70–99)
Glucose-Capillary: 121 mg/dL — ABNORMAL HIGH (ref 70–99)
Glucose-Capillary: 152 mg/dL — ABNORMAL HIGH (ref 70–99)
Glucose-Capillary: 145 mg/dL — ABNORMAL HIGH (ref 70–99)

## 2021-05-22 LAB — CBC
HCT: 39.2 % (ref 39.0–52.0)
Hemoglobin: 12.7 g/dL — ABNORMAL LOW (ref 13.0–17.0)
MCH: 28.2 pg (ref 26.0–34.0)
MCHC: 32.4 g/dL (ref 30.0–36.0)
MCV: 87.1 fL (ref 80.0–100.0)
Platelets: 250 10*3/uL (ref 150–400)
RBC: 4.5 MIL/uL (ref 4.22–5.81)
RDW: 13.3 % (ref 11.5–15.5)
WBC: 14.3 10*3/uL — ABNORMAL HIGH (ref 4.0–10.5)
nRBC: 0 % (ref 0.0–0.2)

## 2021-05-22 LAB — COMPREHENSIVE METABOLIC PANEL
ALT: 23 U/L (ref 0–44)
AST: 33 U/L (ref 15–41)
Albumin: 3.4 g/dL — ABNORMAL LOW (ref 3.5–5.0)
Alkaline Phosphatase: 58 U/L (ref 38–126)
Anion gap: 9 (ref 5–15)
BUN: 16 mg/dL (ref 8–23)
CO2: 23 mmol/L (ref 22–32)
Calcium: 8.2 mg/dL — ABNORMAL LOW (ref 8.9–10.3)
Chloride: 100 mmol/L (ref 98–111)
Creatinine, Ser: 0.76 mg/dL (ref 0.61–1.24)
GFR, Estimated: >60 mL/min (ref >60)
Glucose, Bld: 209 mg/dL — ABNORMAL HIGH (ref 70–99)
Potassium: 4.2 mmol/L (ref 3.5–5.1)
Sodium: 132 mmol/L — ABNORMAL LOW (ref 135–145)
Total Bilirubin: 0.5 mg/dL (ref 0.3–1.2)
Total Protein: 6.4 g/dL — ABNORMAL LOW (ref 6.5–8.1)

## 2021-05-22 IMAGING — DX DG CHEST 1V PORT
1 series · 2 of 2 positions shown · non-contrast
Comparison: [DATE].

CLINICAL DATA: Shortness of breath.

EXAM:
PORTABLE CHEST 1 VIEW

[Series 1: chest · 0.14mm/px · 2 of 2 slices shown]
[im 1/2]
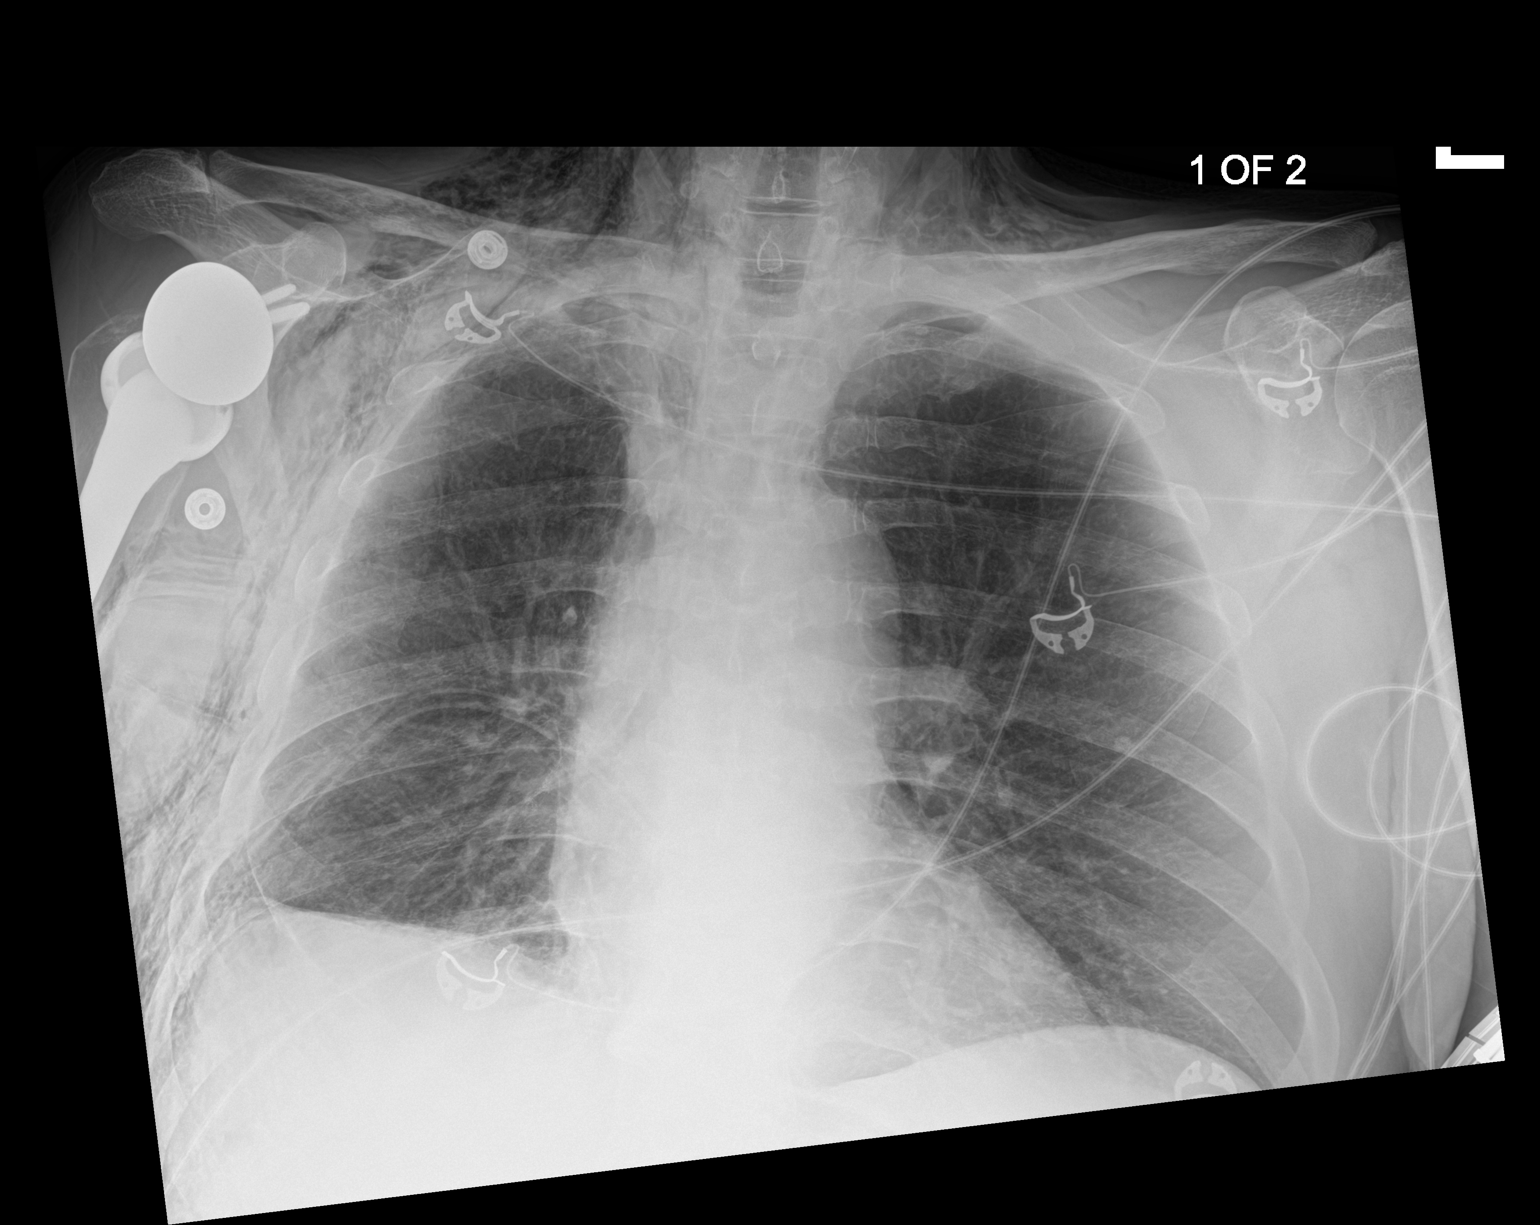
[im 2/2]
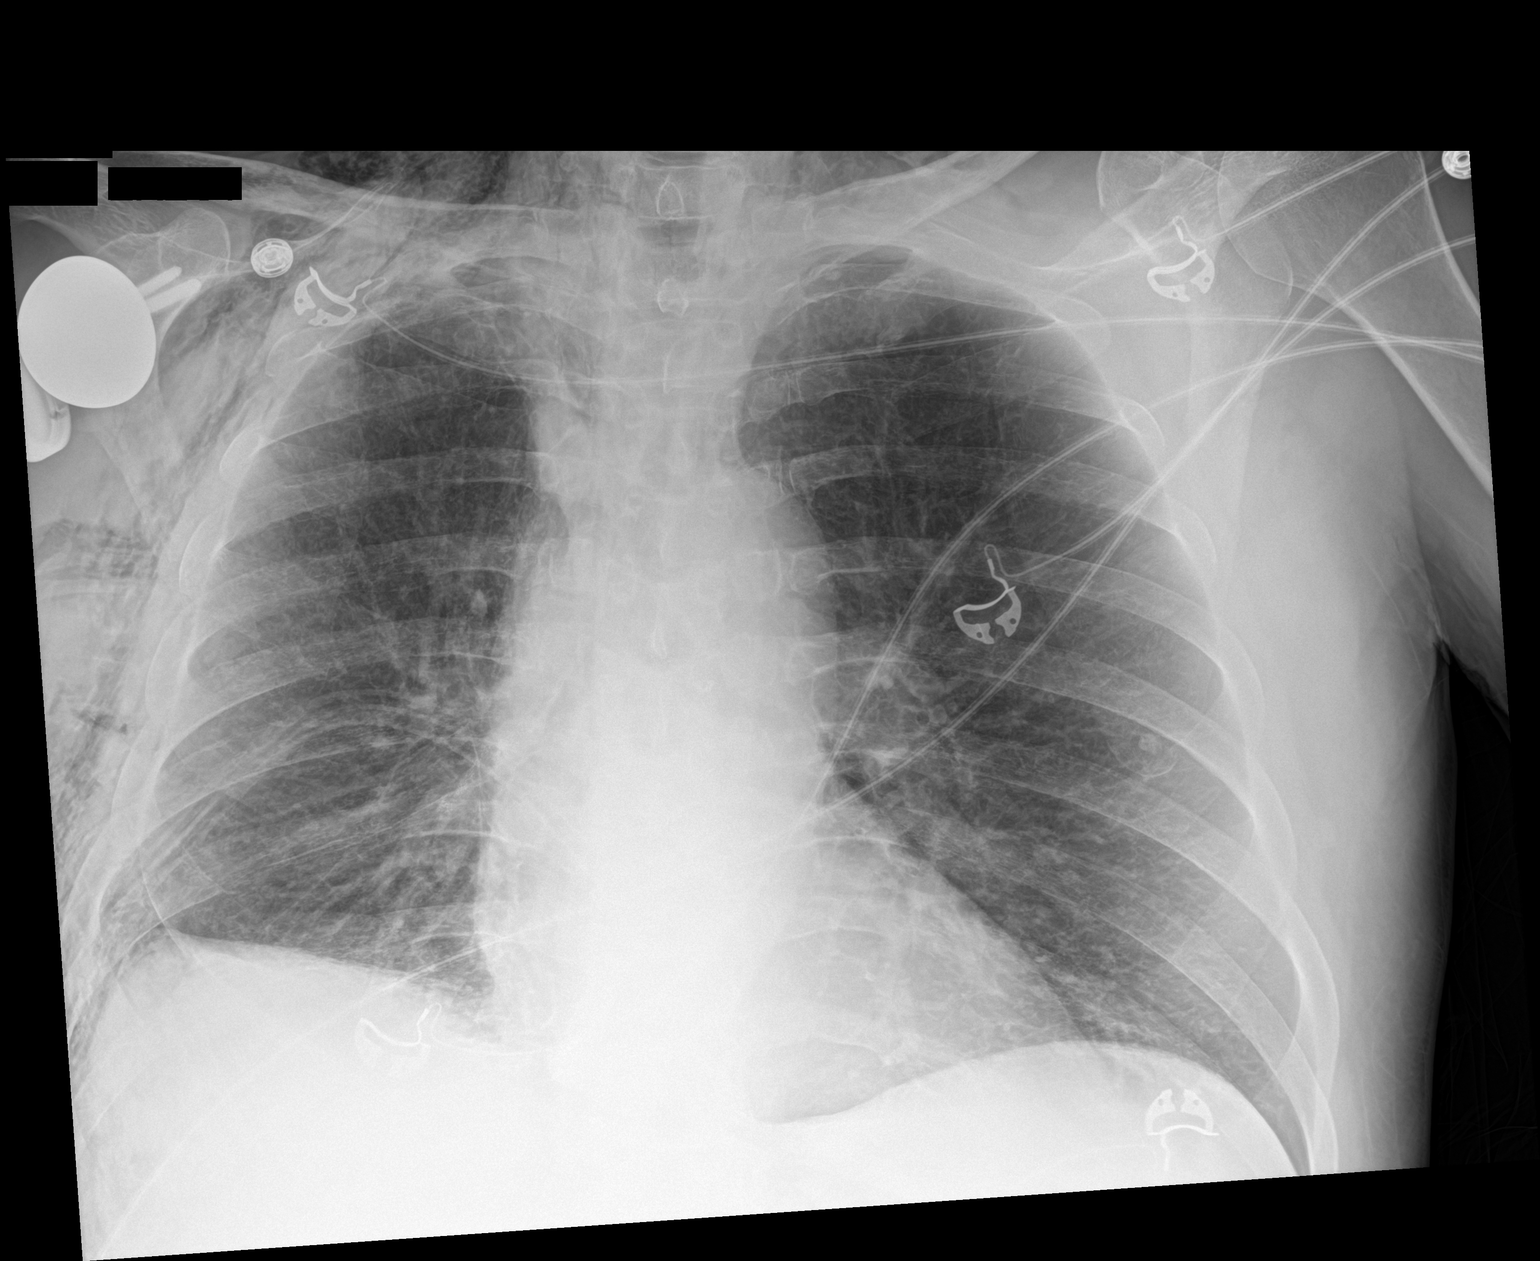

[2 of 2 positions shown; findings below may reference images not displayed]

FINDINGS: The heart size and mediastinal contours are within normal limits.
Left lung is clear. Minimal right basilar subsegmental atelectasis
is noted. No definite pneumothorax is noted. Stable subcutaneous
emphysema is seen over right lateral chest wall and both
supraclavicular regions. Status post right shoulder arthroplasty.
IMPRESSION: No definite pneumothorax is noted. Minimal right basilar
subsegmental atelectasis. Stable extensive subcutaneous emphysema.

## 2021-05-22 IMAGING — DX DG CHEST 1V PORT
1 series · 2 of 2 positions shown · non-contrast
Comparison: [DATE]

CLINICAL DATA: Post lobectomy of lung

EXAM:
PORTABLE CHEST 1 VIEW

[Series 1: chest · 0.14mm/px · 2 of 2 slices shown]
[im 1/2]
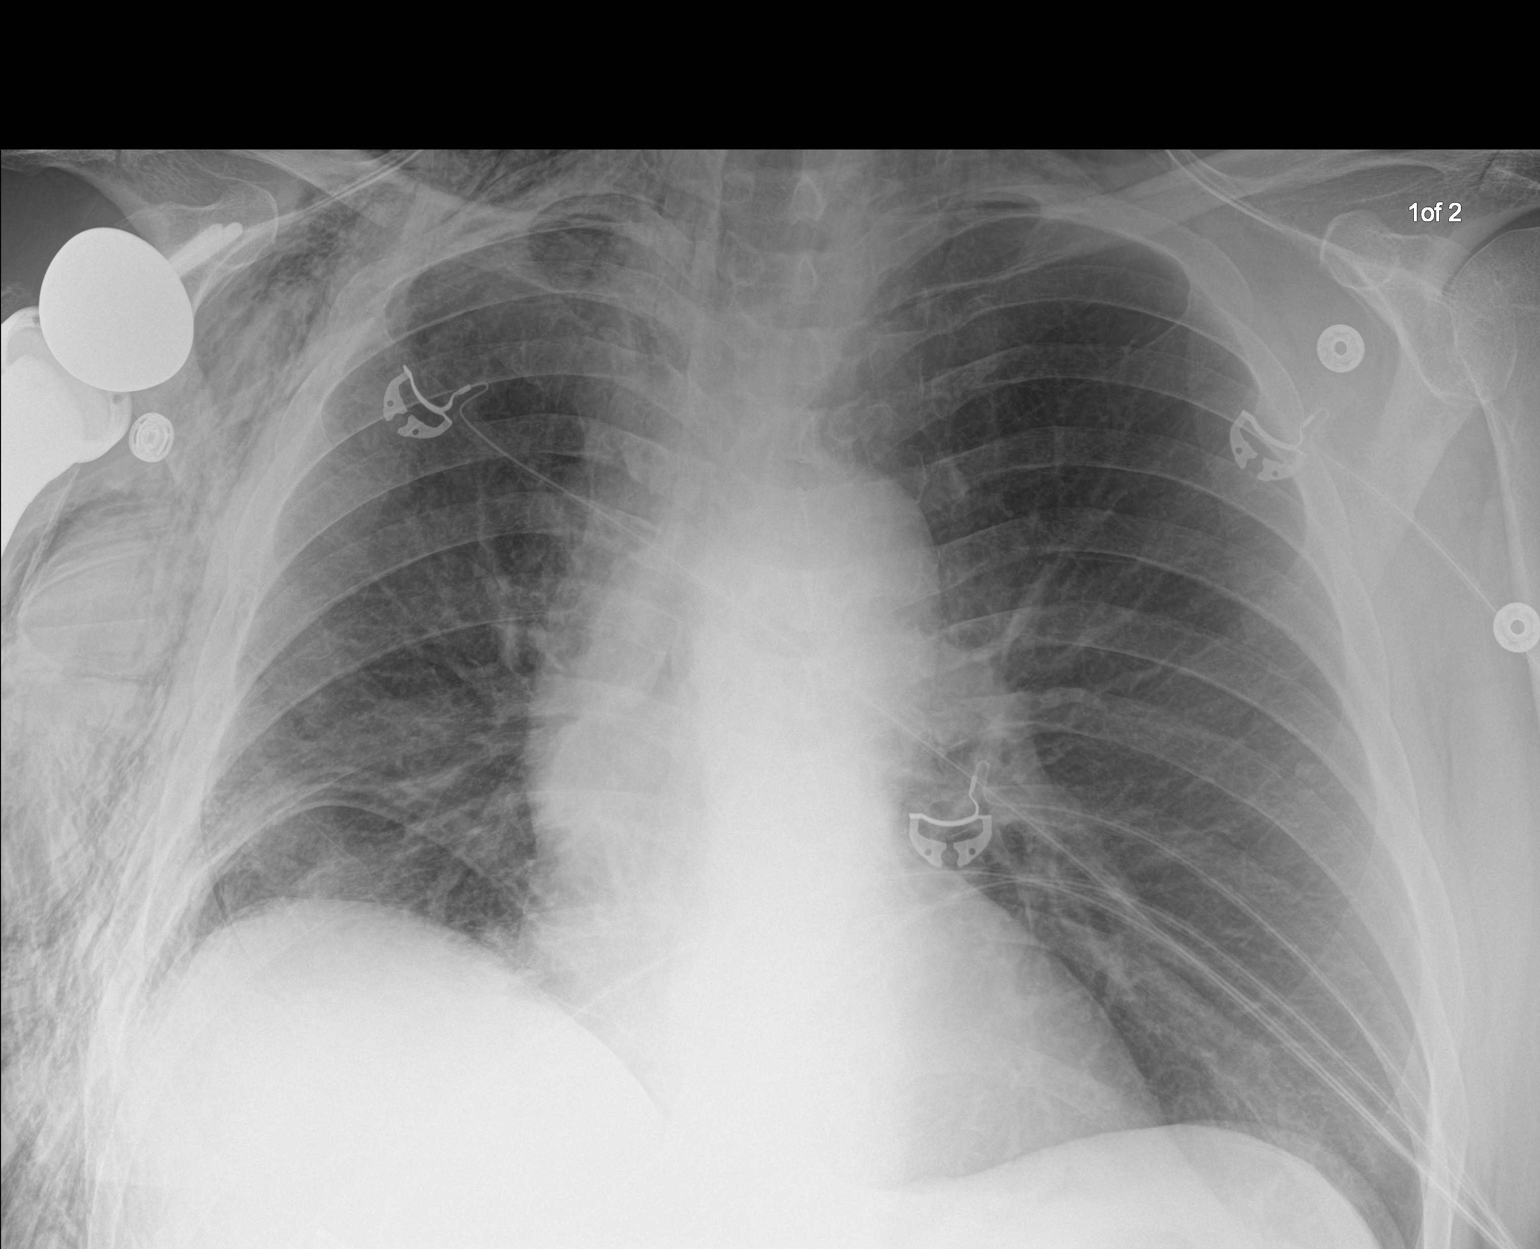
[im 2/2]
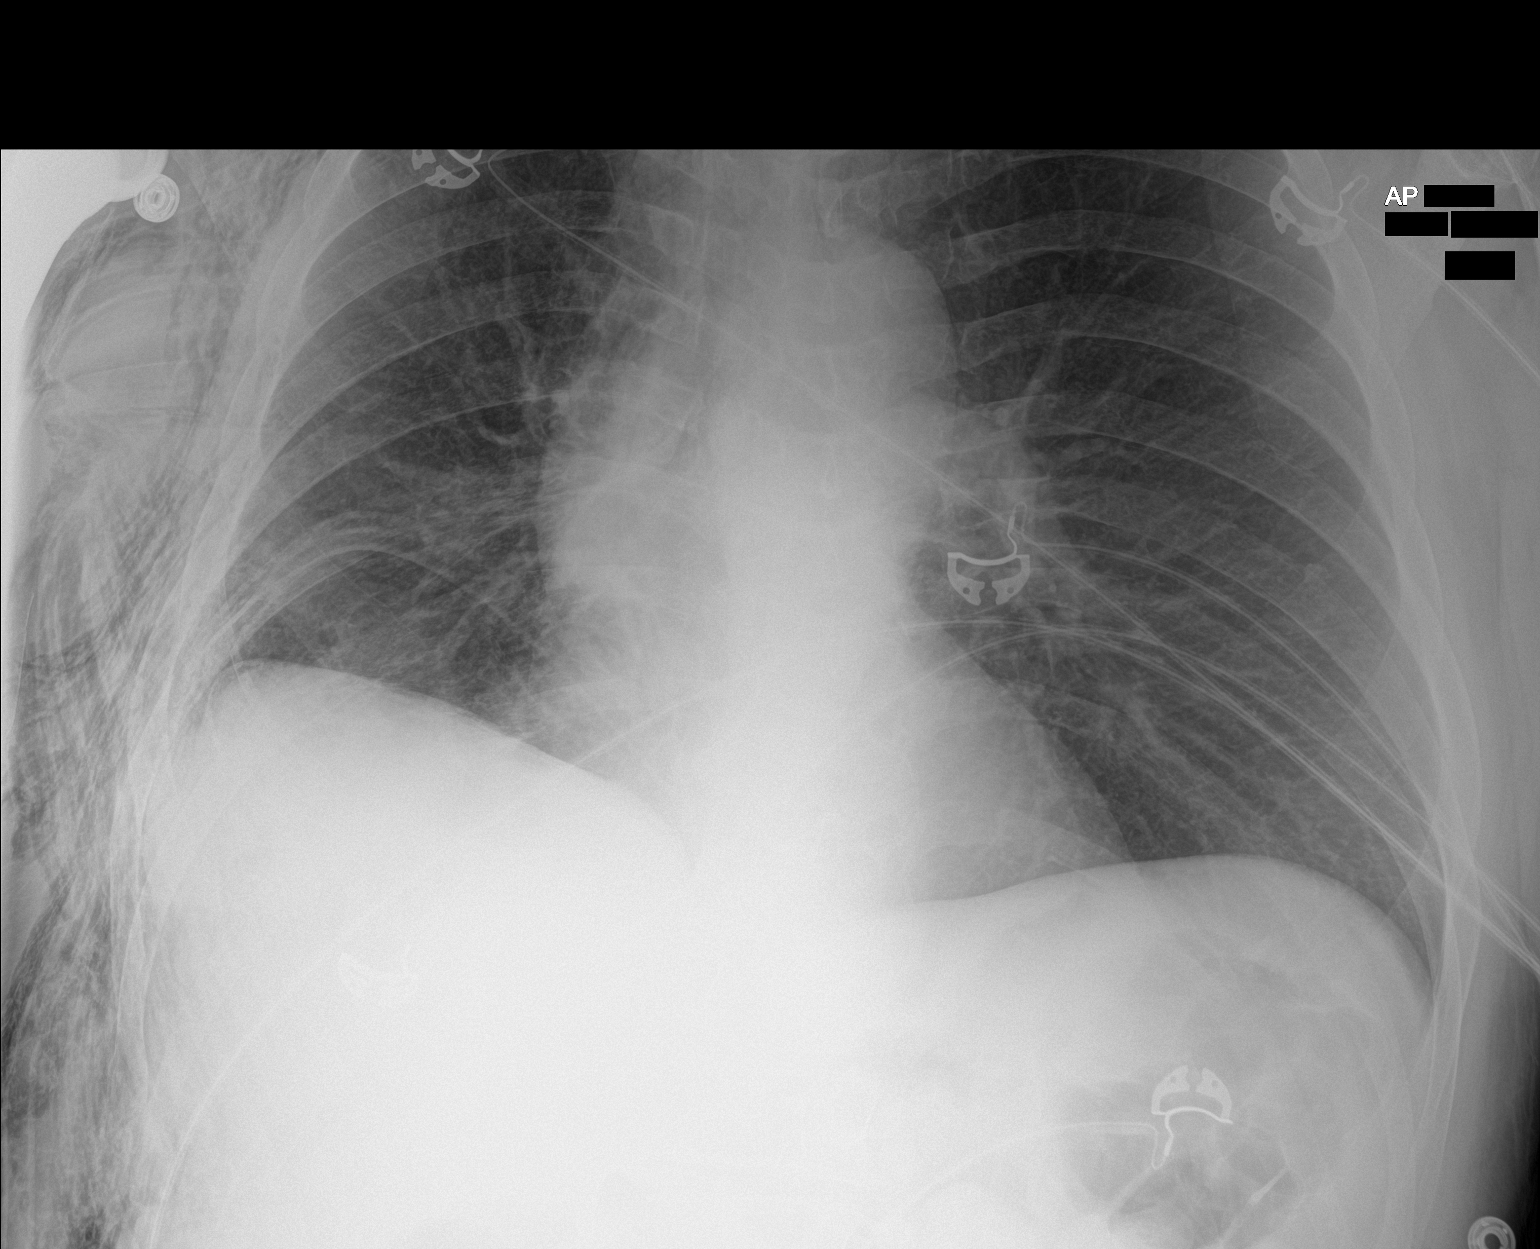

[2 of 2 positions shown; findings below may reference images not displayed]

FINDINGS: Stable cardiomediastinal contours. Volume loss in the right chest as
before with basilar atelectasis. No significant pleural effusion. No
definite pneumothorax. Extensive subcutaneous emphysema similar to
prior.
IMPRESSION: No substantial change. No definite pneumothorax and similar
extensive subcutaneous emphysema.

## 2021-05-22 MED ORDER — REVEFENACIN 175 MCG/3ML IN SOLN
175.0000 ug | Freq: Every day | RESPIRATORY_TRACT | Status: DC
  Administered 2021-05-22 – 2021-06-02 (×12): 175 ug via RESPIRATORY_TRACT
  Filled 2021-05-22 (×12): qty 3

## 2021-05-22 MED ORDER — FLUTICASONE FUROATE-VILANTEROL 100-25 MCG/ACT IN AEPB
1.0000 | INHALATION_SPRAY | Freq: Every day | RESPIRATORY_TRACT | Status: DC
Start: 2021-05-22 — End: 2021-05-22
  Filled 2021-05-22: qty 28

## 2021-05-22 MED ORDER — BUDESONIDE 0.25 MG/2ML IN SUSP
0.2500 mg | Freq: Two times a day (BID) | RESPIRATORY_TRACT | Status: DC
  Administered 2021-05-22 – 2021-06-02 (×23): 0.25 mg via RESPIRATORY_TRACT
  Filled 2021-05-22 (×23): qty 2

## 2021-05-22 MED ORDER — TAMSULOSIN HCL 0.4 MG PO CAPS
0.4000 mg | ORAL_CAPSULE | Freq: Every day | ORAL | Status: DC
  Administered 2021-05-22 – 2021-06-02 (×11): 0.4 mg via ORAL
  Filled 2021-05-22 (×12): qty 1

## 2021-05-22 MED ORDER — LEVALBUTEROL HCL 1.25 MG/0.5ML IN NEBU
1.2500 mg | INHALATION_SOLUTION | RESPIRATORY_TRACT | Status: DC
  Administered 2021-05-22 – 2021-05-23 (×2): 1.25 mg via RESPIRATORY_TRACT
  Filled 2021-05-22 (×4): qty 0.5

## 2021-05-22 MED ORDER — UMECLIDINIUM BROMIDE 62.5 MCG/ACT IN AEPB
1.0000 | INHALATION_SPRAY | Freq: Every day | RESPIRATORY_TRACT | Status: DC
  Filled 2021-05-22: qty 7

## 2021-05-22 MED ORDER — ALBUTEROL SULFATE (2.5 MG/3ML) 0.083% IN NEBU
2.5000 mg | INHALATION_SOLUTION | RESPIRATORY_TRACT | Status: DC
Start: 2021-05-22 — End: 2021-05-22
  Administered 2021-05-22 (×2): 2.5 mg via RESPIRATORY_TRACT
  Filled 2021-05-22 (×2): qty 3

## 2021-05-22 NOTE — Progress Notes (Signed)
2 Days Post-Op Procedure(s) (LRB): XI ROBOTIC ASSISTED THORASCOPY-RIGHT LOWER LOBECTOMY (Right) LYMPH NODE DISSECTION (Right) INTERCOSTAL NERVE BLOCK (Right) Subjective: Feels better this AM, c/o urinary retention  Objective: Vital signs in last 24 hours: Temp:  [97.8 F (36.6 C)-98.1 F (36.7 C)] 98.1 F (36.7 C) (12/11 0810) Pulse Rate:  [78-102] 95 (12/11 0800) Cardiac Rhythm: Normal sinus rhythm (12/11 0800) Resp:  [13-25] 17 (12/11 0800) BP: (103-138)/(68-94) 138/94 (12/11 0800) SpO2:  [92 %-100 %] 98 % (12/11 0800) Weight:  [88.1 kg] 88.1 kg (12/11 0530)  Hemodynamic parameters for last 24 hours:    Intake/Output from previous day: 12/10 0701 - 12/11 0700 In: 1140 [P.O.:1140] Out: 1955 [Urine:1375; Chest Tube:580] Intake/Output this shift: Total I/O In: -  Out: 90 [Chest Tube:90]  General appearance: alert, cooperative, and no distress Neurologic: intact Heart: regular rate and rhythm Lungs: diminished breath sounds right base and faint wheeze on right Wound: clean and dry + air leak  Lab Results: Recent Labs    05/21/21 0100 05/22/21 0105  WBC 15.2* 14.3*  HGB 12.9* 12.7*  HCT 39.1 39.2  PLT 277 250   BMET:  Recent Labs    05/21/21 0100 05/22/21 0105  NA 131* 132*  K 4.2 4.2  CL 98 100  CO2 24 23  GLUCOSE 155* 209*  BUN 12 16  CREATININE 0.79 0.76  CALCIUM 8.3* 8.2*    PT/INR: No results for input(s): LABPROT, INR in the last 72 hours. ABG    Component Value Date/Time   PHART 7.405 05/18/2021 1450   HCO3 23.9 05/18/2021 1450   ACIDBASEDEF 0.2 05/18/2021 1450   O2SAT 96.8 05/18/2021 1450   CBG (last 3)  Recent Labs    05/21/21 1620 05/21/21 2131 05/22/21 0641  GLUCAP 151* 163* 182*    Assessment/Plan: S/P Procedure(s) (LRB): XI ROBOTIC ASSISTED THORASCOPY-RIGHT LOWER LOBECTOMY (Right) LYMPH NODE DISSECTION (Right) INTERCOSTAL NERVE BLOCK (Right) POD # 2 NEURO- intact CV- SR RESP- wheezing improved, continue nebs, pulmonary  hygiene  Appreciate Pulmonary's assistance  Still has air leak- will leave tube on suction today RENAL- diuresed well GI- diet as tolerated SCD + enoxaparin for DVT prophylaxis Ambulate Pain better controlled this AM   LOS: 2 days    Stephen Diaz 05/22/2021

## 2021-05-22 NOTE — Progress Notes (Signed)
      [address]       [place] [address]             [phone]      Trying to void  BP 135/82   Pulse (!) 119   Temp 97.9 F (36.6 C) (Oral)   Resp (!) 21   Ht 6\' 1"  (1.854 m)   Wt 88.1 kg   SpO2 97%   BMI 25.62 kg/m   Intake/Output Summary (Last 24 hours) at 05/22/2021 1751 Last data filed at 05/22/2021 1700 Gross per 24 hour  Intake 1500 ml  Output 2645 ml  Net -1145 ml    A little tachy with albuterol- changed to [employer] good this evening  Remo Lipps C. Roxan Hockey, MD [facility] [phone]

## 2021-05-22 NOTE — Progress Notes (Signed)
NAME:  Stephen Diaz, MRN:  [id number], DOB:  1955-08-22, LOS: 2 ADMISSION DATE:  05/20/2021, CONSULTATION DATE:  05/21/2020 REFERRING MD: Dr. Gorden Harms, CHIEF COMPLAINT: Increasing shortness of breath  History of Present Illness:  This is a 65 year old male that was admitted on 12/01 for a previously diagnosed cavitary right lung mass.  Patient was scheduled for robotic assisted thoracoscopy and lobectomy.  Procedure went well and transferred to the surgical floor on 05/20/2021.  Right-sided chest tube was left in place.  Today the patient developed increasing respiratory distress and wheezing.  Patient was transferred to the intensive care unit for further work-up.  During the period of respiratory distress patient had right sided chest pain that radiated to the substernal region.  That is now resolved.  He also states that his respiratory distress/shortness of breath is significantly improved.  He denies any nausea or vomiting.  Pertinent  Medical History  COPD Diabetes mellitus Hypertension Right foot drop Arthritis  Significant Hospital Events: Including procedures, antibiotic start and stop dates in addition to other pertinent events   Robotic assisted thoracoscopy with right lower lobe lobectomy, lymph node dissection and intercostal nerve block  Interim History / Subjective:  Feeling better since yesterday  Objective   Blood pressure 136/87, pulse (!) 107, temperature 98.1 F (36.7 C), temperature source Oral, resp. rate 20, height 6\' 1"  (1.854 m), weight 88.1 kg, SpO2 98 %.        Intake/Output Summary (Last 24 hours) at 05/22/2021 1331 Last data filed at 05/22/2021 1300 Gross per 24 hour  Intake 540 ml  Output 2635 ml  Net -2095 ml   Filed Weights   05/22/21 0530  Weight: 88.1 kg    Examination: Chronically ill appearing Diminished bilaterally, bibasilar crackles, few rhonchi Chest tube in place with air leak   Assessment & Plan:  Acute COPD  exacerbation Acute hypoxemic and hypercapnic respiratory failure Status post right lower lobe lobectomy with lymph node dissection. Non-small cell carcinoma Diabetes mellitus  Plan: Dyspnea improved with bronchodilators. Will schedule this for today.  Did not tolerate DPI Trelegy substitutes on formulary. Will transition to budesonide, yupelri while inpatient. Still sob.  Prn bipap  Nicotine patch  I think we can get by without additional steroids. Minimizing these in the setting of persistent air leak and recent surgery.   PCCM will follow  Best Practice (right click and "Reselect all SmartList Selections" daily)   Diet/type: Regular consistency (see orders) DVT prophylaxis: LMWH GI prophylaxis: PPI Lines: N/A Foley:  N/A Code Status:  full code Last date of multidisciplinary goals of care discussion [per primary]  Labs   CBC: Recent Labs  Lab 05/18/21 1430 05/21/21 0100 05/22/21 0105  WBC 13.0* 15.2* 14.3*  HGB 15.6 12.9* 12.7*  HCT 47.4 39.1 39.2  MCV 87.5 86.1 87.1  PLT 363 277 295    Basic Metabolic Panel: Recent Labs  Lab 05/18/21 1430 05/21/21 0100 05/22/21 0105  NA 132* 131* 132*  K 4.3 4.2 4.2  CL 100 98 100  CO2 21* 24 23  GLUCOSE 137* 155* 209*  BUN 15 12 16   CREATININE 0.72 0.79 0.76  CALCIUM 9.0 8.3* 8.2*   GFR: Estimated Creatinine Clearance: 102.6 mL/min (by C-G formula based on SCr of 0.76 mg/dL). Recent Labs  Lab 05/18/21 1430 05/21/21 0100 05/22/21 0105  WBC 13.0* 15.2* 14.3*    Liver Function Tests: Recent Labs  Lab 05/18/21 1430 05/22/21 0105  AST 25 33  ALT 21 23  ALKPHOS  74 58  BILITOT 0.2* 0.5  PROT 7.2 6.4*  ALBUMIN 4.1 3.4*   No results for input(s): LIPASE, AMYLASE in the last 168 hours. No results for input(s): AMMONIA in the last 168 hours.  ABG    Component Value Date/Time   PHART 7.405 05/18/2021 1450   PCO2ART 38.9 05/18/2021 1450   PO2ART 82.8 (L) 05/18/2021 1450   HCO3 23.9 05/18/2021 1450    ACIDBASEDEF 0.2 05/18/2021 1450   O2SAT 96.8 05/18/2021 1450     Coagulation Profile: Recent Labs  Lab 05/18/21 1430  INR 1.1    Cardiac Enzymes: No results for input(s): CKTOTAL, CKMB, CKMBINDEX, TROPONINI in the last 168 hours.  HbA1C: Hgb A1c MFr Bld  Date/Time Value Ref Range Status  05/18/2021 03:00 PM 6.2 (H) 4.8 - 5.6 % Final    Comment:    (NOTE) Pre diabetes:          5.7%-6.4%  Diabetes:              >6.4%  Glycemic control for   <7.0% adults with diabetes     CBG: Recent Labs  Lab 05/21/21 1153 05/21/21 1620 05/21/21 2131 05/22/21 0641 05/22/21 1121  GLUCAP 94 151* 163* 182* 121*

## 2021-05-22 NOTE — Progress Notes (Signed)
Pt Called out to RN with trouble breathing. Upon assessment pt is wheezing audibly when entering room in tripod position struggling to speak between breaths.Chest tube drainage is pink and frothy with froth in tube as well as drainage system. Pt  02 in high 90s still on 4L of O2 however respirations in 30s.  Increase in subcutaneous air in right chest, dressing changed to Vaseline Lorelle Form[place]. MD notified  Rapid response notified  Respiratory notified  Rapid with RN at bedside

## 2021-05-23 ENCOUNTER — Encounter (HOSPITAL_COMMUNITY): Payer: Self-pay | Admitting: Thoracic Surgery (Cardiothoracic Vascular Surgery)

## 2021-05-23 ENCOUNTER — Inpatient Hospital Stay (HOSPITAL_COMMUNITY): Payer: Medicare Other

## 2021-05-23 DIAGNOSIS — J441 Chronic obstructive pulmonary disease with (acute) exacerbation: Secondary | ICD-10-CM | POA: Diagnosis not present

## 2021-05-23 LAB — CBC
HCT: 36.4 % — ABNORMAL LOW (ref 39.0–52.0)
Hemoglobin: 12 g/dL — ABNORMAL LOW (ref 13.0–17.0)
MCH: 28.5 pg (ref 26.0–34.0)
MCHC: 33 g/dL (ref 30.0–36.0)
MCV: 86.5 fL (ref 80.0–100.0)
Platelets: 271 10*3/uL (ref 150–400)
RBC: 4.21 MIL/uL — ABNORMAL LOW (ref 4.22–5.81)
RDW: 13.4 % (ref 11.5–15.5)
WBC: 13.6 10*3/uL — ABNORMAL HIGH (ref 4.0–10.5)
nRBC: 0 % (ref 0.0–0.2)

## 2021-05-23 LAB — BASIC METABOLIC PANEL
Anion gap: 8 (ref 5–15)
BUN: 13 mg/dL (ref 8–23)
CO2: 27 mmol/L (ref 22–32)
Calcium: 8.3 mg/dL — ABNORMAL LOW (ref 8.9–10.3)
Chloride: 97 mmol/L — ABNORMAL LOW (ref 98–111)
Creatinine, Ser: 0.68 mg/dL (ref 0.61–1.24)
GFR, Estimated: >60 mL/min (ref >60)
Glucose, Bld: 162 mg/dL — ABNORMAL HIGH (ref 70–99)
Potassium: 4.2 mmol/L (ref 3.5–5.1)
Sodium: 132 mmol/L — ABNORMAL LOW (ref 135–145)

## 2021-05-23 LAB — GLUCOSE, CAPILLARY
Glucose-Capillary: 133 mg/dL — ABNORMAL HIGH (ref 70–99)
Glucose-Capillary: 155 mg/dL — ABNORMAL HIGH (ref 70–99)
Glucose-Capillary: 139 mg/dL — ABNORMAL HIGH (ref 70–99)
Glucose-Capillary: 164 mg/dL — ABNORMAL HIGH (ref 70–99)

## 2021-05-23 IMAGING — DX DG CHEST 1V
1 series · 1 of 1 positions shown · non-contrast
Comparison: [DATE]

CLINICAL DATA: Chest tube in place.

EXAM:
CHEST  1 VIEW

[chest ap]
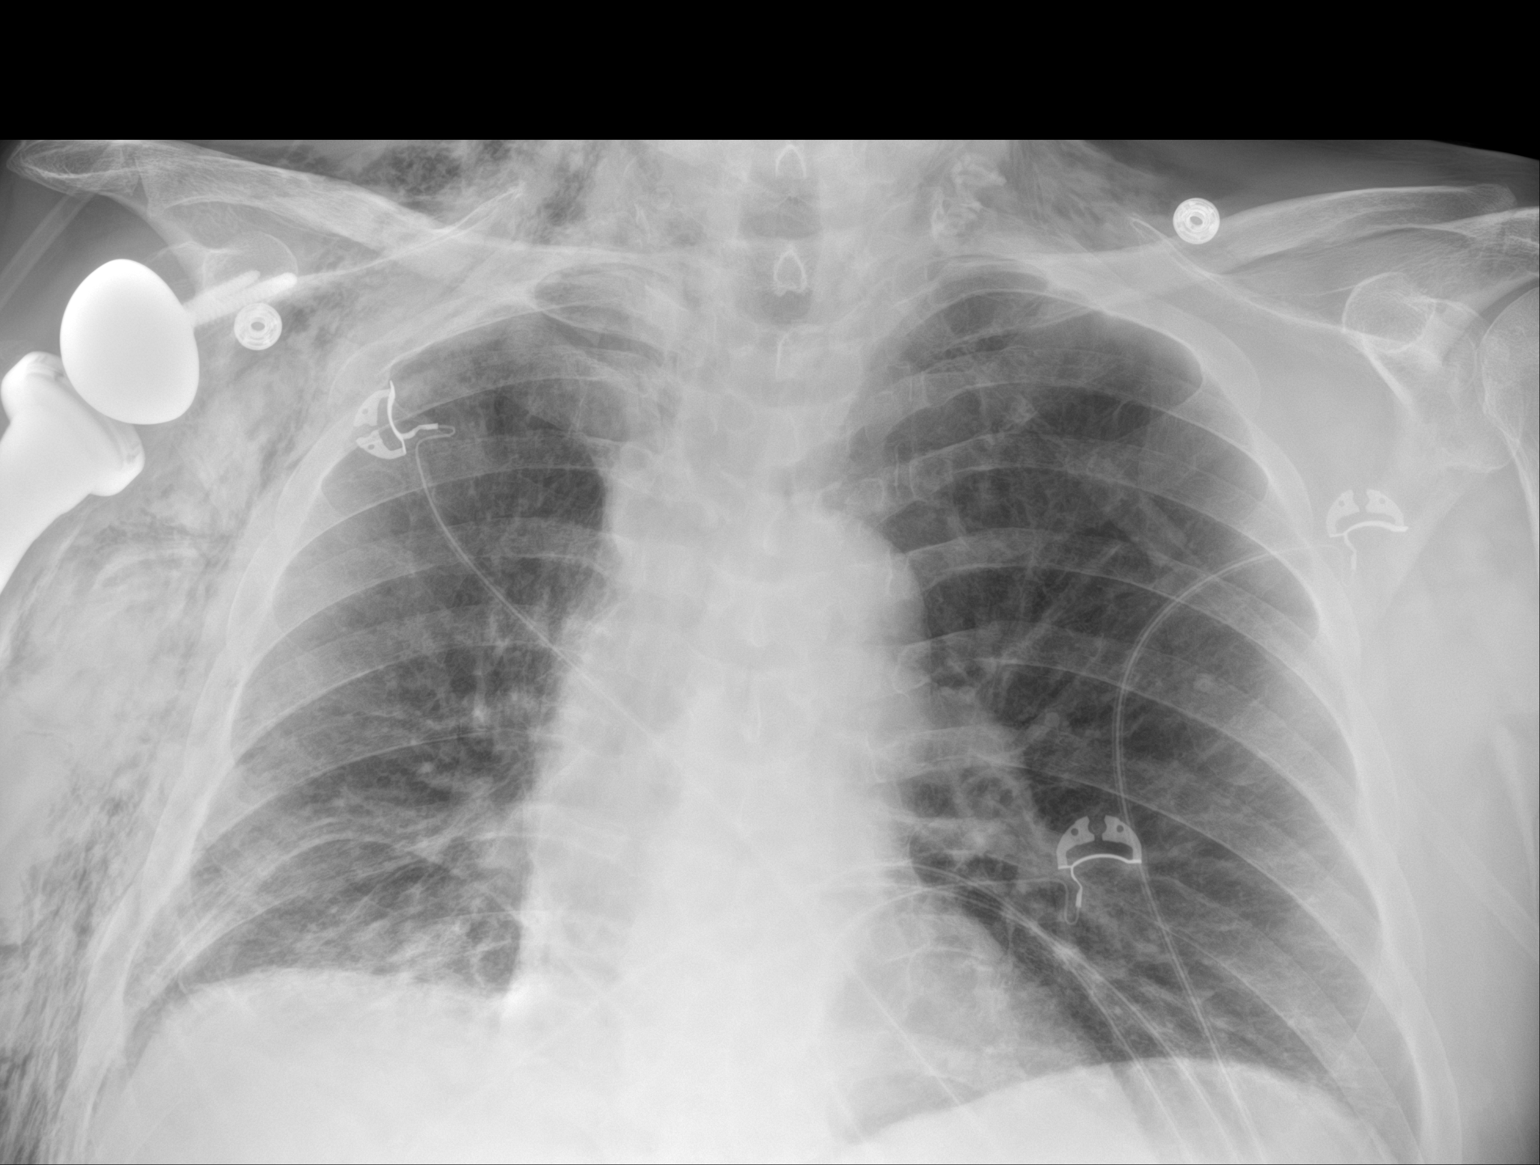

[1 of 1 positions shown; findings below may reference images not displayed]

FINDINGS: Again noted is a right basilar chest tube. Negative for
pneumothorax. Large amount of subcutaneous gas in the in the right
chest and left lower neck are again noted. Persistent patchy
densities in the right lower chest could represent atelectasis and
minimally changed. Persistent volume loss in the right hemithorax.
Heart and mediastinum are stable. Postsurgical changes in the neck
and right shoulder.
IMPRESSION: 1. Stable position of the right chest tube without a pneumothorax.
2. Persistent densities in the right lower chest that could
represent atelectasis.
3. Persistent subcutaneous gas.

## 2021-05-23 MED ORDER — GABAPENTIN 100 MG PO CAPS
100.0000 mg | ORAL_CAPSULE | Freq: Two times a day (BID) | ORAL | Status: DC
  Administered 2021-05-23 – 2021-06-02 (×21): 100 mg via ORAL
  Filled 2021-05-23 (×21): qty 1

## 2021-05-23 MED ORDER — LEVALBUTEROL HCL 0.63 MG/3ML IN NEBU: 0.6300 mg | INHALATION_SOLUTION | Freq: Four times a day (QID) | RESPIRATORY_TRACT | Status: DC | PRN

## 2021-05-23 MED ORDER — LEVALBUTEROL HCL 1.25 MG/0.5ML IN NEBU
1.2500 mg | INHALATION_SOLUTION | Freq: Two times a day (BID) | RESPIRATORY_TRACT | Status: DC
  Administered 2021-05-23 – 2021-06-02 (×20): 1.25 mg via RESPIRATORY_TRACT
  Filled 2021-05-23 (×20): qty 0.5

## 2021-05-23 MED ORDER — ORAL CARE MOUTH RINSE
15.0000 mL | Freq: Two times a day (BID) | OROMUCOSAL | Status: DC
  Administered 2021-05-23 – 2021-05-31 (×10): 15 mL via OROMUCOSAL

## 2021-05-23 MED ORDER — CHLORHEXIDINE GLUCONATE CLOTH 2 % EX PADS
6.0000 | MEDICATED_PAD | Freq: Every day | CUTANEOUS | Status: DC
  Administered 2021-05-24 – 2021-05-25 (×2): 6 via TOPICAL

## 2021-05-23 MED ORDER — KETOROLAC TROMETHAMINE 15 MG/ML IJ SOLN
15.0000 mg | Freq: Four times a day (QID) | INTRAMUSCULAR | Status: AC
  Administered 2021-05-23 – 2021-05-25 (×8): 15 mg via INTRAVENOUS
  Filled 2021-05-23 (×8): qty 1

## 2021-05-23 MED ORDER — POLYETHYLENE GLYCOL 3350 17 G PO PACK
17.0000 g | PACK | Freq: Every day | ORAL | Status: DC
Start: 2021-05-23 — End: 2021-06-02
  Administered 2021-05-23 – 2021-05-26 (×4): 17 g via ORAL
  Filled 2021-05-23 (×7): qty 1

## 2021-05-23 NOTE — Plan of Care (Signed)

## 2021-05-23 NOTE — Progress Notes (Signed)
EVENING ROUNDS NOTE :     [address]       [place] [address]             [phone]                 3 Days Post-Op Procedure(s) (LRB): XI ROBOTIC ASSISTED THORASCOPY-RIGHT LOWER LOBECTOMY (Right) LYMPH NODE DISSECTION (Right) INTERCOSTAL NERVE BLOCK (Right)   Total Length of Stay:  LOS: 3 days  Events:   No events On an off O2 today Comfortable in bed    BP 96/74   Pulse 99   Temp 98.1 F (36.7 C) (Oral)   Resp 16   Ht 6\' 1"  (1.854 m)   Wt 89.4 kg   SpO2 97%   BMI 26.00 kg/m           I/O last 3 completed shifts: In: 8588 [P.O.:1740] Out: 20 [Urine:3000; Chest Tube:760]   CBC Latest Ref Rng & Units 05/23/2021 05/22/2021 05/21/2021  WBC 4.0 - 10.5 K/uL 13.6(H) 14.3(H) 15.2(H)  Hemoglobin 13.0 - 17.0 g/dL 12.0(L) 12.7(L) 12.9(L)  Hematocrit 39.0 - 52.0 % 36.4(L) 39.2 39.1  Platelets 150 - 400 K/uL 271 250 277    BMP Latest Ref Rng & Units 05/23/2021 05/22/2021 05/21/2021  Glucose 70 - 99 mg/dL 162(H) 209(H) 155(H)  BUN 8 - 23 mg/dL 13 16 12   Creatinine 0.61 - 1.24 mg/dL 0.68 0.76 0.79  Sodium 135 - 145 mmol/L 132(L) 132(L) 131(L)  Potassium 3.5 - 5.1 mmol/L 4.2 4.2 4.2  Chloride 98 - 111 mmol/L 97(L) 100 98  CO2 22 - 32 mmol/L 27 23 24   Calcium 8.9 - 10.3 mg/dL 8.3(L) 8.2(L) 8.3(L)    ABG    Component Value Date/Time   PHART 7.405 05/18/2021 1450   PCO2ART 38.9 05/18/2021 1450   PO2ART 82.8 (L) 05/18/2021 1450   HCO3 23.9 05/18/2021 1450   ACIDBASEDEF 0.2 05/18/2021 1450   O2SAT 96.8 05/18/2021 1450       Melodie Bouillon, MD 05/23/2021 4:34 PM

## 2021-05-23 NOTE — Plan of Care (Signed)

## 2021-05-23 NOTE — Progress Notes (Addendum)
[address]       [place] [address]             [phone]      3 Days Post-Op Procedure(s) (LRB): XI ROBOTIC ASSISTED THORASCOPY-RIGHT LOWER LOBECTOMY (Right) LYMPH NODE DISSECTION (Right) INTERCOSTAL NERVE BLOCK (Right) Subjective: Feeling better, less congestion  Objective: Vital signs in last 24 hours: Temp:  [97.9 F (36.6 C)-98.1 F (36.7 C)] 97.9 F (36.6 C) (12/12 0400) Pulse Rate:  [87-120] 109 (12/12 0700) Cardiac Rhythm: Normal sinus rhythm (12/12 0400) Resp:  [13-23] 23 (12/12 0700) BP: (91-156)/(69-94) 121/92 (12/12 0700) SpO2:  [90 %-100 %] 97 % (12/12 0700) Weight:  [89.4 kg] 89.4 kg (12/12 0630)  Hemodynamic parameters for last 24 hours:    Intake/Output from previous day: 12/11 0701 - 12/12 0700 In: 1320 [P.O.:1320] Out: 2025 [Urine:1625; Chest Tube:400] Intake/Output this shift: No intake/output data recorded.  General appearance: alert, cooperative, and no distress Heart: regular rate and rhythm and tachy Lungs: some scattered ronchi Abdomen: benign Extremities: no edema or calf tenderness Wound: incis healing well  Lab Results: Recent Labs    05/22/21 0105 05/23/21 0220  WBC 14.3* 13.6*  HGB 12.7* 12.0*  HCT 39.2 36.4*  PLT 250 271   BMET:  Recent Labs    05/22/21 0105 05/23/21 0220  NA 132* 132*  K 4.2 4.2  CL 100 97*  CO2 23 27  GLUCOSE 209* 162*  BUN 16 13  CREATININE 0.76 0.68  CALCIUM 8.2* 8.3*    PT/INR: No results for input(s): LABPROT, INR in the last 72 hours. ABG    Component Value Date/Time   PHART 7.405 05/18/2021 1450   HCO3 23.9 05/18/2021 1450   ACIDBASEDEF 0.2 05/18/2021 1450   O2SAT 96.8 05/18/2021 1450   CBG (last 3)  Recent Labs    05/22/21 1636 05/22/21 2147 05/23/21 0627  GLUCAP 152* 145* 133*    Meds Scheduled Meds:  acetaminophen  1,000 mg Oral Q6H   Or   acetaminophen (TYLENOL) oral liquid 160 mg/5 mL  1,000 mg Oral Q6H   aspirin EC  81 mg Oral Daily    bisacodyl  10 mg Oral Daily   budesonide (PULMICORT) nebulizer solution  0.25 mg Nebulization BID   Chlorhexidine Gluconate Cloth  6 each Topical Daily   enoxaparin (LOVENOX) injection  40 mg Subcutaneous Daily   guaiFENesin  1,200 mg Oral BID   insulin aspart  0-15 Units Subcutaneous TID WC   levalbuterol  1.25 mg Nebulization Q4H   lisinopril  10 mg Oral Daily   mouth rinse  15 mL Mouth Rinse BID   metFORMIN  1,000 mg Oral Q breakfast   pantoprazole  40 mg Oral Daily   revefenacin  175 mcg Nebulization Daily   senna-docusate  1 tablet Oral QHS   tamsulosin  0.4 mg Oral Daily   varenicline  1 mg Oral BID   Continuous Infusions: PRN Meds:.morphine injection, ondansetron (ZOFRAN) IV, oxyCODONE  Xrays DG Chest Port 1 View  Result Date: 05/22/2021 CLINICAL DATA:  Post lobectomy of lung EXAM: PORTABLE CHEST 1 VIEW COMPARISON:  05/22/2021 FINDINGS: Stable cardiomediastinal contours. Volume loss in the right chest as before with basilar atelectasis. No significant pleural effusion. No definite pneumothorax. Extensive subcutaneous emphysema similar to prior. IMPRESSION: No substantial change. No definite pneumothorax and similar extensive subcutaneous emphysema. Electronically Signed   By: Macy Mis M.D.   On: 05/22/2021 09:20   DG CHEST PORT 1  VIEW  Result Date: 05/22/2021 CLINICAL DATA:  Shortness of breath. EXAM: PORTABLE CHEST 1 VIEW COMPARISON:  May 21, 2021. FINDINGS: The heart size and mediastinal contours are within normal limits. Left lung is clear. Minimal right basilar subsegmental atelectasis is noted. No definite pneumothorax is noted. Stable subcutaneous emphysema is seen over right lateral chest wall and both supraclavicular regions. Status post right shoulder arthroplasty. IMPRESSION: No definite pneumothorax is noted. Minimal right basilar subsegmental atelectasis. Stable extensive subcutaneous emphysema. Electronically Signed   By: Marijo Conception M.D.   On:  05/22/2021 06:20   DG Chest Port 1 View  Result Date: 05/21/2021 CLINICAL DATA:  Shortness of breath.  Status post recent lobectomy. EXAM: PORTABLE CHEST 1 VIEW COMPARISON:  Earlier radiograph dated 05/21/2021. FINDINGS: No focal consolidation or pleural effusion. No large pneumothorax. Evaluation for pneumothorax is however limited due to extensive subcutaneous emphysema of the right chest wall. Overall similar appearance of the lungs and chest wall emphysema as the prior radiograph. Stable cardiomediastinal silhouette. Right shoulder arthroplasty. No acute osseous pathology. IMPRESSION: No interval change. Electronically Signed   By: Anner Crete M.D.   On: 05/21/2021 21:05   DG Chest Port 1 View  Result Date: 05/21/2021 CLINICAL DATA:  Status post lobectomy, respiratory distress EXAM: PORTABLE CHEST 1 VIEW COMPARISON:  05/21/2021 at 0653 hours FINDINGS: Postsurgical changes with volume loss in the right hemithorax. The trace right pneumothorax on the prior study is not well visualized, possibly obscured by progressive subcutaneous emphysema along the right lateral chest wall. Left lung is essentially clear. The heart is normal in size. Right shoulder arthroplasty. IMPRESSION: Postsurgical changes with volume loss in the right hemithorax. Prior trace right pneumothorax is not well visualized, possibly obscured by progressive subcutaneous emphysema along the right lateral chest wall. Electronically Signed   By: Julian Hy M.D.   On: 05/21/2021 19:23    Assessment/Plan: S/P Procedure(s) (LRB): XI ROBOTIC ASSISTED THORASCOPY-RIGHT LOWER LOBECTOMY (Right) LYMPH NODE DISSECTION (Right) INTERCOSTAL NERVE BLOCK (Right) POD#3  1 afeb, VSS S BP 91-156 range, sinus rhythm 2 sat sgood on RA 3 good UOP- normal renal fxn 4 CT 400 cc /24 h recorded, + AIR leak- keep in place for now 5 CXR stable in appearance, no pntx + SQ air 6 minor leukocytosis, trend improving 7 expected ABLA- a little  lower, monitor 8 minor hyponatremia - monitor 9 adeq BS control on metformin 10 cont nebs, pulm hygiene 11 routine rehab     LOS: 3 days    Stephen Giovanni PA-C Pager [phone] 05/23/2021  Patient seen and examined, agree with above Air leak still present but significantly improved Still requiring I/O cath for urinary retention Continue pulmonary Rx Path pending Txf to tele  Remo Lipps C. Roxan Hockey, MD [facility] [phone]

## 2021-05-23 NOTE — Progress Notes (Signed)
  Transition of Care [facility]) Screening Note   Patient Details  Name: Stephen Diaz Date of Birth: 24-Feb-1956   Transition of Care [facility]) CM/SW Contact:    Milas Gain, [place] Phone Number: 05/23/2021, 4:59 PM    Transition of Care Department [facility]) has reviewed patient and no TOC needs have been identified at this time. We will continue to monitor patient advancement through interdisciplinary progression rounds. If new patient transition needs arise, please place a TOC consult.

## 2021-05-23 NOTE — Progress Notes (Signed)
   NAME:  WENDY MIKLES, MRN:  [id number], DOB:  22-Nov-1955, LOS: 3 ADMISSION DATE:  05/20/2021, CONSULTATION DATE:  05/21/2020 REFERRING MD: Dr. Gorden Harms, CHIEF COMPLAINT: Increasing shortness of breath  History of Present Illness:  This is a 65 year old male that was admitted on 12/01 for a previously diagnosed cavitary right lung mass.  Patient was scheduled for robotic assisted thoracoscopy and lobectomy.  Procedure went well and transferred to the surgical floor on 05/20/2021.  Right-sided chest tube was left in place.  Today the patient developed increasing respiratory distress and wheezing.  Patient was transferred to the intensive care unit for further work-up.  During the period of respiratory distress patient had right sided chest pain that radiated to the substernal region.  That is now resolved.  He also states that his respiratory distress/shortness of breath is significantly improved.  He denies any nausea or vomiting.  Pertinent  Medical History  COPD Diabetes mellitus Hypertension Right foot drop Arthritis  Significant Hospital Events: Including procedures, antibiotic start and stop dates in addition to other pertinent events   12/9 Robotic assisted thoracoscopy with right lower lobe lobectomy, lymph node dissection and intercostal nerve block 12/10 readmission to ICU for respiratory distress felt to be due to AECOPD  Interim History / Subjective:  Feeling better since yesterday  Objective   Blood pressure 112/70, pulse 97, temperature 99.5 F (37.5 C), temperature source Oral, resp. rate 16, height 6\' 1"  (1.854 m), weight 89.4 kg, SpO2 96 %.        Intake/Output Summary (Last 24 hours) at 05/23/2021 1054 Last data filed at 05/23/2021 0730 Gross per 24 hour  Intake 1200 ml  Output 1980 ml  Net -780 ml    Filed Weights   05/22/21 0530 05/23/21 0630  Weight: 88.1 kg 89.4 kg    Examination: Chronically ill appearing on no drips, uncomfortable on deep  inspiration Diminished bilaterally, bibasilar crackles, few rhonchi HS normal Chest tube in place with air leak No edema  Ancillary Tests Personally Reviewed:  CXR 12/12:  essentially normal with possible atelectasis.   Assessment & Plan:  Acute COPD exacerbation Acute hypoxemic and hypercapnic respiratory failure Status post right lower lobe lobectomy with lymph node dissection. Non-small cell carcinoma Diabetes mellitus  Plan: Dyspnea improved with bronchodilators. Will schedule this for today.  Did not tolerate DPI Trelegy substitutes on formulary. Will transition to budesonide, yupelri while inpatient. Still sob.  Prn bipap  Nicotine patch I think we can get by without additional steroids. Minimizing these in the setting of persistent air leak and recent surgery.  Major issue at present is chest pain - will add gabapentin  Best Practice (right click and "Reselect all SmartList Selections" daily)   Diet/type: Regular consistency (see orders) DVT prophylaxis: LMWH GI prophylaxis: PPI Lines: N/A Foley:  N/A Code Status:  full code Last date of multidisciplinary goals of care discussion [per primary]  Kipp Brood, MD [facility] ICU Physician [place]  Pager: [phone] Or Epic Secure Chat After hours: [phone].  05/23/2021, 11:07 AM

## 2021-05-24 ENCOUNTER — Inpatient Hospital Stay (HOSPITAL_COMMUNITY): Payer: Medicare Other

## 2021-05-24 LAB — GLUCOSE, CAPILLARY
Glucose-Capillary: 133 mg/dL — ABNORMAL HIGH (ref 70–99)
Glucose-Capillary: 109 mg/dL — ABNORMAL HIGH (ref 70–99)
Glucose-Capillary: 120 mg/dL — ABNORMAL HIGH (ref 70–99)
Glucose-Capillary: 129 mg/dL — ABNORMAL HIGH (ref 70–99)

## 2021-05-24 IMAGING — DX DG CHEST 1V PORT
1 series · 1 of 1 positions shown · non-contrast
Comparison: Previous studies including the examination of
[DATE]

CLINICAL DATA: Right chest tube in place difficulty breathing

EXAM:
PORTABLE CHEST 1 VIEW

[chest]
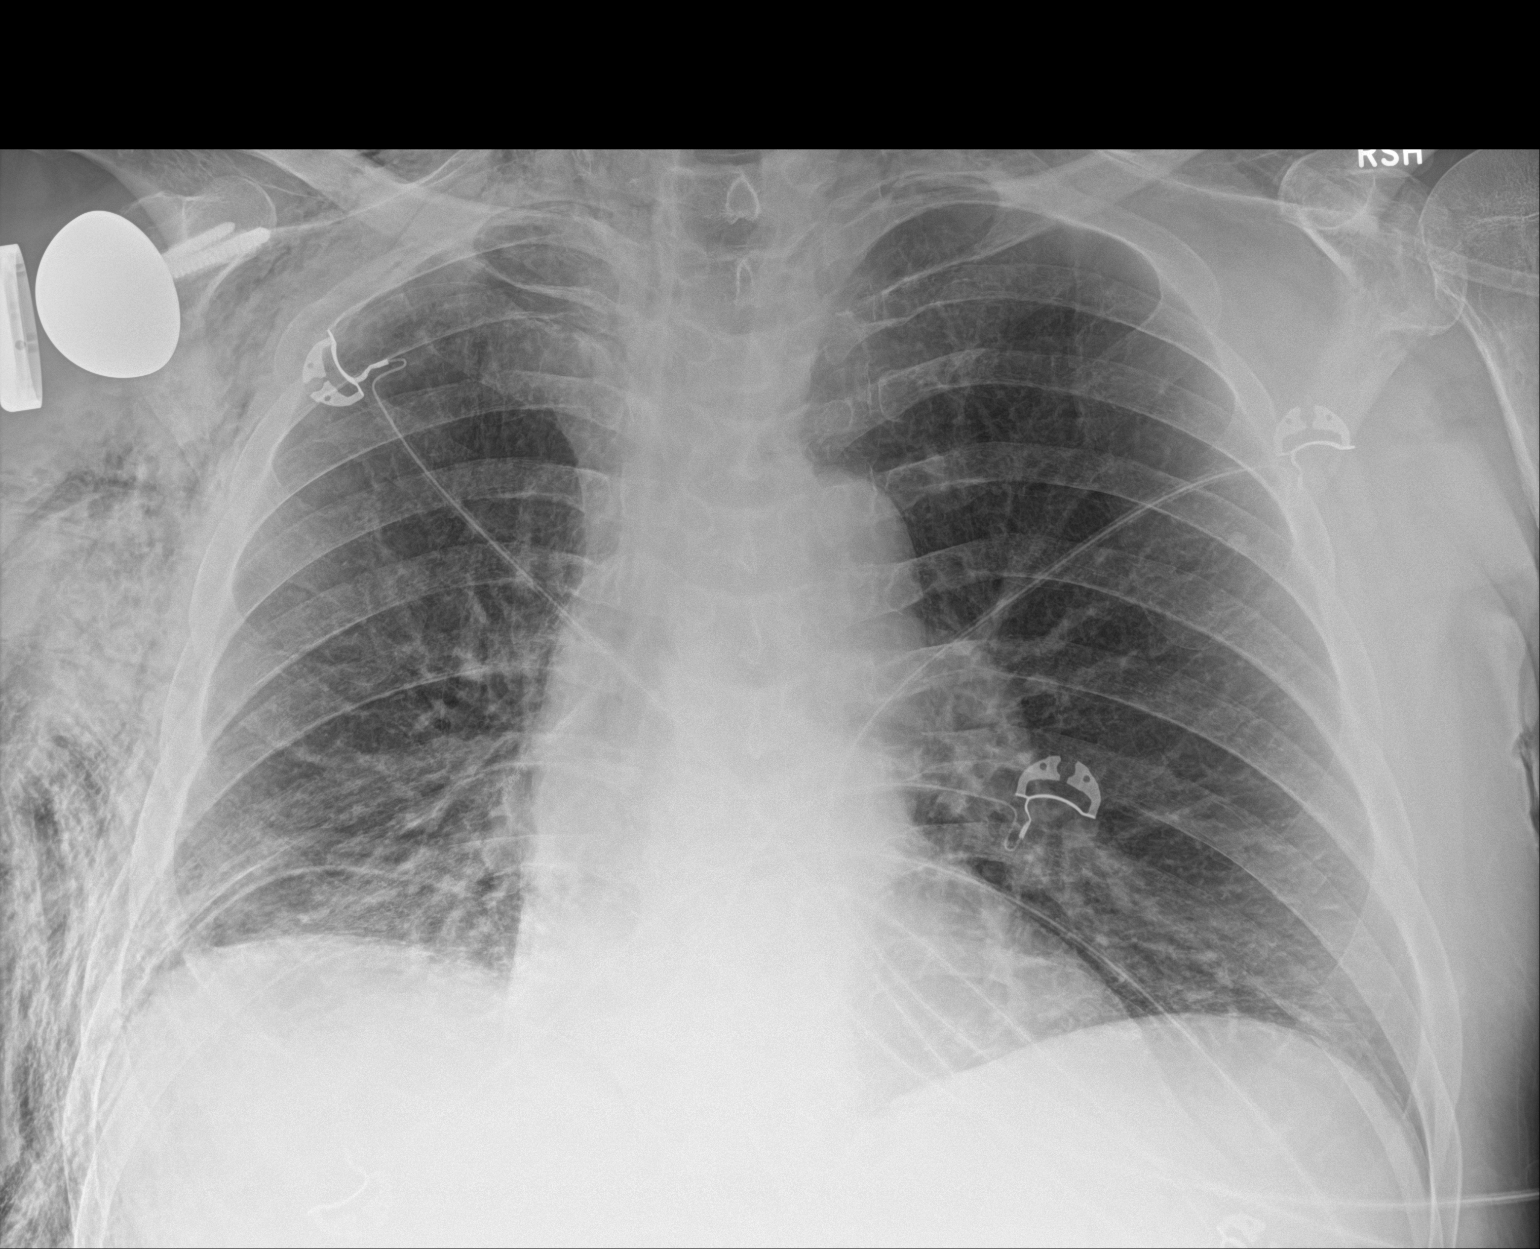

[1 of 1 positions shown; findings below may reference images not displayed]

FINDINGS: Tip of right chest tube is noted in the medial right lower lung
fields. There is possible minimal right apical pneumothorax.
Extensive subcutaneous emphysema is seen in the right chest wall and
neck with possible worsening. There are increased markings in both
lower lung fields, more so on the right side with interval
worsening. There are no signs of pulmonary edema or focal pulmonary
consolidation. Possible calcified granuloma is seen in the right
upper lung fields. There is previous right shoulder arthroplasty.
IMPRESSION: Right chest tube is noted in place. There is possible small right
apical pneumothorax. Increased markings are seen in both lower lung
fields, more so on the right side with interval worsening. This
finding may suggest subsegmental atelectasis/pneumonitis.

## 2021-05-24 NOTE — Plan of Care (Signed)

## 2021-05-24 NOTE — Plan of Care (Signed)
Stable during shift. Slept well after prn pain meds. CT remains on suction,  air leak decreased, + subcutaneous air, CT output = 370 ml/24 hr.   OOB to BSC/recliner, gait steady. Dyspnea with exertion but recovers, no drop in sats. Voiding.   Transfer orders to 4E, awaiting bed.  Problem: Education: Goal: Knowledge of General Education information will improve Description: Including pain rating scale, medication(s)/side effects and non-pharmacologic comfort measures Outcome: Progressing   Problem: Health Behavior/Discharge Planning: Goal: Ability to manage health-related needs will improve Outcome: Progressing   Problem: Clinical Measurements: Goal: Ability to maintain clinical measurements within normal limits will improve Outcome: Progressing Goal: Will remain free from infection Outcome: Progressing Goal: Diagnostic test results will improve Outcome: Progressing Goal: Respiratory complications will improve Outcome: Progressing Goal: Cardiovascular complication will be avoided Outcome: Progressing   Problem: Activity: Goal: Risk for activity intolerance will decrease Outcome: Progressing   Problem: Nutrition: Goal: Adequate nutrition will be maintained Outcome: Progressing   Problem: Coping: Goal: Level of anxiety will decrease Outcome: Progressing   Problem: Elimination: Goal: Will not experience complications related to bowel motility Outcome: Progressing Goal: Will not experience complications related to urinary retention Outcome: Progressing   Problem: Pain Managment: Goal: General experience of comfort will improve Outcome: Progressing   Problem: Safety: Goal: Ability to remain free from injury will improve Outcome: Progressing   Problem: Skin Integrity: Goal: Risk for impaired skin integrity will decrease Outcome: Progressing

## 2021-05-24 NOTE — Progress Notes (Signed)
[address]       [place] [address]             [phone]      4 Days Post-Op Procedure(s) (LRB): XI ROBOTIC ASSISTED THORASCOPY-RIGHT LOWER LOBECTOMY (Right) LYMPH NODE DISSECTION (Right) INTERCOSTAL NERVE BLOCK (Right) Subjective: C/o discomfort from tube, overall progressing well  Objective: Vital signs in last 24 hours: Temp:  [97.4 F (36.3 C)-99.5 F (37.5 C)] 97.9 F (36.6 C) (12/13 0400) Pulse Rate:  [91-108] 93 (12/13 0600) Cardiac Rhythm: Normal sinus rhythm (12/13 0000) Resp:  [11-25] 19 (12/13 0600) BP: (87-114)/(58-81) 106/81 (12/13 0600) SpO2:  [91 %-100 %] 95 % (12/13 0600) Weight:  [88.9 kg] 88.9 kg (12/13 0600)  Hemodynamic parameters for last 24 hours:    Intake/Output from previous day: 12/12 0701 - 12/13 0700 In: 300 [P.O.:300] Out: 1960 [Urine:1530; Chest Tube:430] Intake/Output this shift: No intake/output data recorded.  General appearance: alert, cooperative, and no distress Heart: regular rate and rhythm Lungs: minor scattered wheeze/ronchi Abdomen: benign Extremities: no edema or calf tenderness Wound: incis healing well  Lab Results: Recent Labs    05/22/21 0105 05/23/21 0220  WBC 14.3* 13.6*  HGB 12.7* 12.0*  HCT 39.2 36.4*  PLT 250 271   BMET:  Recent Labs    05/22/21 0105 05/23/21 0220  NA 132* 132*  K 4.2 4.2  CL 100 97*  CO2 23 27  GLUCOSE 209* 162*  BUN 16 13  CREATININE 0.76 0.68  CALCIUM 8.2* 8.3*    PT/INR: No results for input(s): LABPROT, INR in the last 72 hours. ABG    Component Value Date/Time   PHART 7.405 05/18/2021 1450   HCO3 23.9 05/18/2021 1450   ACIDBASEDEF 0.2 05/18/2021 1450   O2SAT 96.8 05/18/2021 1450   CBG (last 3)  Recent Labs    05/23/21 1654 05/23/21 2220 05/24/21 0612  GLUCAP 139* 164* 133*    Meds Scheduled Meds:  aspirin EC  81 mg Oral Daily   bisacodyl  10 mg Oral Daily   budesonide (PULMICORT) nebulizer solution  0.25 mg Nebulization BID    Chlorhexidine Gluconate Cloth  6 each Topical Daily   enoxaparin (LOVENOX) injection  40 mg Subcutaneous Daily   gabapentin  100 mg Oral BID   guaiFENesin  1,200 mg Oral BID   insulin aspart  0-15 Units Subcutaneous TID WC   ketorolac  15 mg Intravenous Q6H   levalbuterol  1.25 mg Nebulization BID   lisinopril  10 mg Oral Daily   mouth rinse  15 mL Mouth Rinse BID   metFORMIN  1,000 mg Oral Q breakfast   pantoprazole  40 mg Oral Daily   polyethylene glycol  17 g Oral Daily   revefenacin  175 mcg Nebulization Daily   senna-docusate  1 tablet Oral QHS   tamsulosin  0.4 mg Oral Daily   varenicline  1 mg Oral BID   Continuous Infusions: PRN Meds:.levalbuterol, morphine injection, ondansetron (ZOFRAN) IV, oxyCODONE  Xrays DG Chest 1 View  Result Date: 05/23/2021 CLINICAL DATA:  Chest tube in place. EXAM: CHEST  1 VIEW COMPARISON:  05/22/2021 FINDINGS: Again noted is a right basilar chest tube. Negative for pneumothorax. Large amount of subcutaneous gas in the in the right chest and left lower neck are again noted. Persistent patchy densities in the right lower chest could represent atelectasis and minimally changed. Persistent volume loss in the right hemithorax. Heart and mediastinum are stable. Postsurgical changes in  the neck and right shoulder. IMPRESSION: 1. Stable position of the right chest tube without a pneumothorax. 2. Persistent densities in the right lower chest that could represent atelectasis. 3. Persistent subcutaneous gas. Electronically Signed   By: Markus Daft M.D.   On: 05/23/2021 08:49   DG Chest Port 1 View  Result Date: 05/22/2021 CLINICAL DATA:  Post lobectomy of lung EXAM: PORTABLE CHEST 1 VIEW COMPARISON:  05/22/2021 FINDINGS: Stable cardiomediastinal contours. Volume loss in the right chest as before with basilar atelectasis. No significant pleural effusion. No definite pneumothorax. Extensive subcutaneous emphysema similar to prior. IMPRESSION: No substantial change.  No definite pneumothorax and similar extensive subcutaneous emphysema. Electronically Signed   By: Macy Mis M.D.   On: 05/22/2021 09:20    Assessment/Plan: S/P Procedure(s) (LRB): XI ROBOTIC ASSISTED THORASCOPY-RIGHT LOWER LOBECTOMY (Right) LYMPH NODE DISSECTION (Right) INTERCOSTAL NERVE BLOCK (Right)  1 Tmax 99.5, S BP 87-110's 2 sats good on RA 3 CT 430 cc- + air leak is better , place on H2O seal 4 good UOP, normal renal fxn 5 CXR stable appearance 6 leukocytosis trending lower 7 H/H pretty stable 8 BS fair control - cont metformin - will need close outpt monitoring 9 cont pulm RX/hygiene, rehab  LOS: 4 days    John Giovanni PA-C Pager [phone] 05/24/2021

## 2021-05-24 NOTE — Progress Notes (Signed)
Pt arrived from [place], VSS, CHG complete, oriented to unit, call light within reach.    Chrisandra Carota, RN 05/24/2021 4:53 PM

## 2021-05-24 NOTE — Anesthesia Postprocedure Evaluation (Signed)
Anesthesia Post Note  Patient: Stephen Diaz  Procedure(s) Performed: XI ROBOTIC ASSISTED THORASCOPY-RIGHT LOWER LOBECTOMY (Right: Chest) LYMPH NODE DISSECTION (Right: Chest) INTERCOSTAL NERVE BLOCK (Right: Chest)     Patient location during evaluation: PACU Anesthesia Type: General Level of consciousness: awake and alert Pain management: pain level controlled Vital Signs Assessment: post-procedure vital signs reviewed and stable Respiratory status: spontaneous breathing, nonlabored ventilation, respiratory function stable and patient connected to nasal cannula oxygen Cardiovascular status: blood pressure returned to baseline and stable Postop Assessment: no apparent nausea or vomiting Anesthetic complications: no   No notable events documented.  Last Vitals:  Vitals:   05/24/21 1559 05/24/21 1651  BP:  110/70  Pulse:  (!) 110  Resp:  18  Temp: 36.9 C 36.6 C  SpO2:  95%    Last Pain:  Vitals:   05/24/21 1651  TempSrc: Oral  PainSc:                  Tiajuana Amass

## 2021-05-25 ENCOUNTER — Inpatient Hospital Stay (HOSPITAL_COMMUNITY): Payer: Medicare Other

## 2021-05-25 LAB — GLUCOSE, CAPILLARY
Glucose-Capillary: 126 mg/dL — ABNORMAL HIGH (ref 70–99)
Glucose-Capillary: 213 mg/dL — ABNORMAL HIGH (ref 70–99)

## 2021-05-25 IMAGING — DX DG CHEST 1V
1 series · 1 of 1 positions shown · non-contrast
Comparison: Prior chest radiographs [DATE] and earlier.
COMPARISON: Prior chest radiographs [DATE] and earlier.

Addendum:
CLINICAL DATA: Provided history: Right chest tube.

EXAM:
CHEST  1 VIEW

[chest ap]
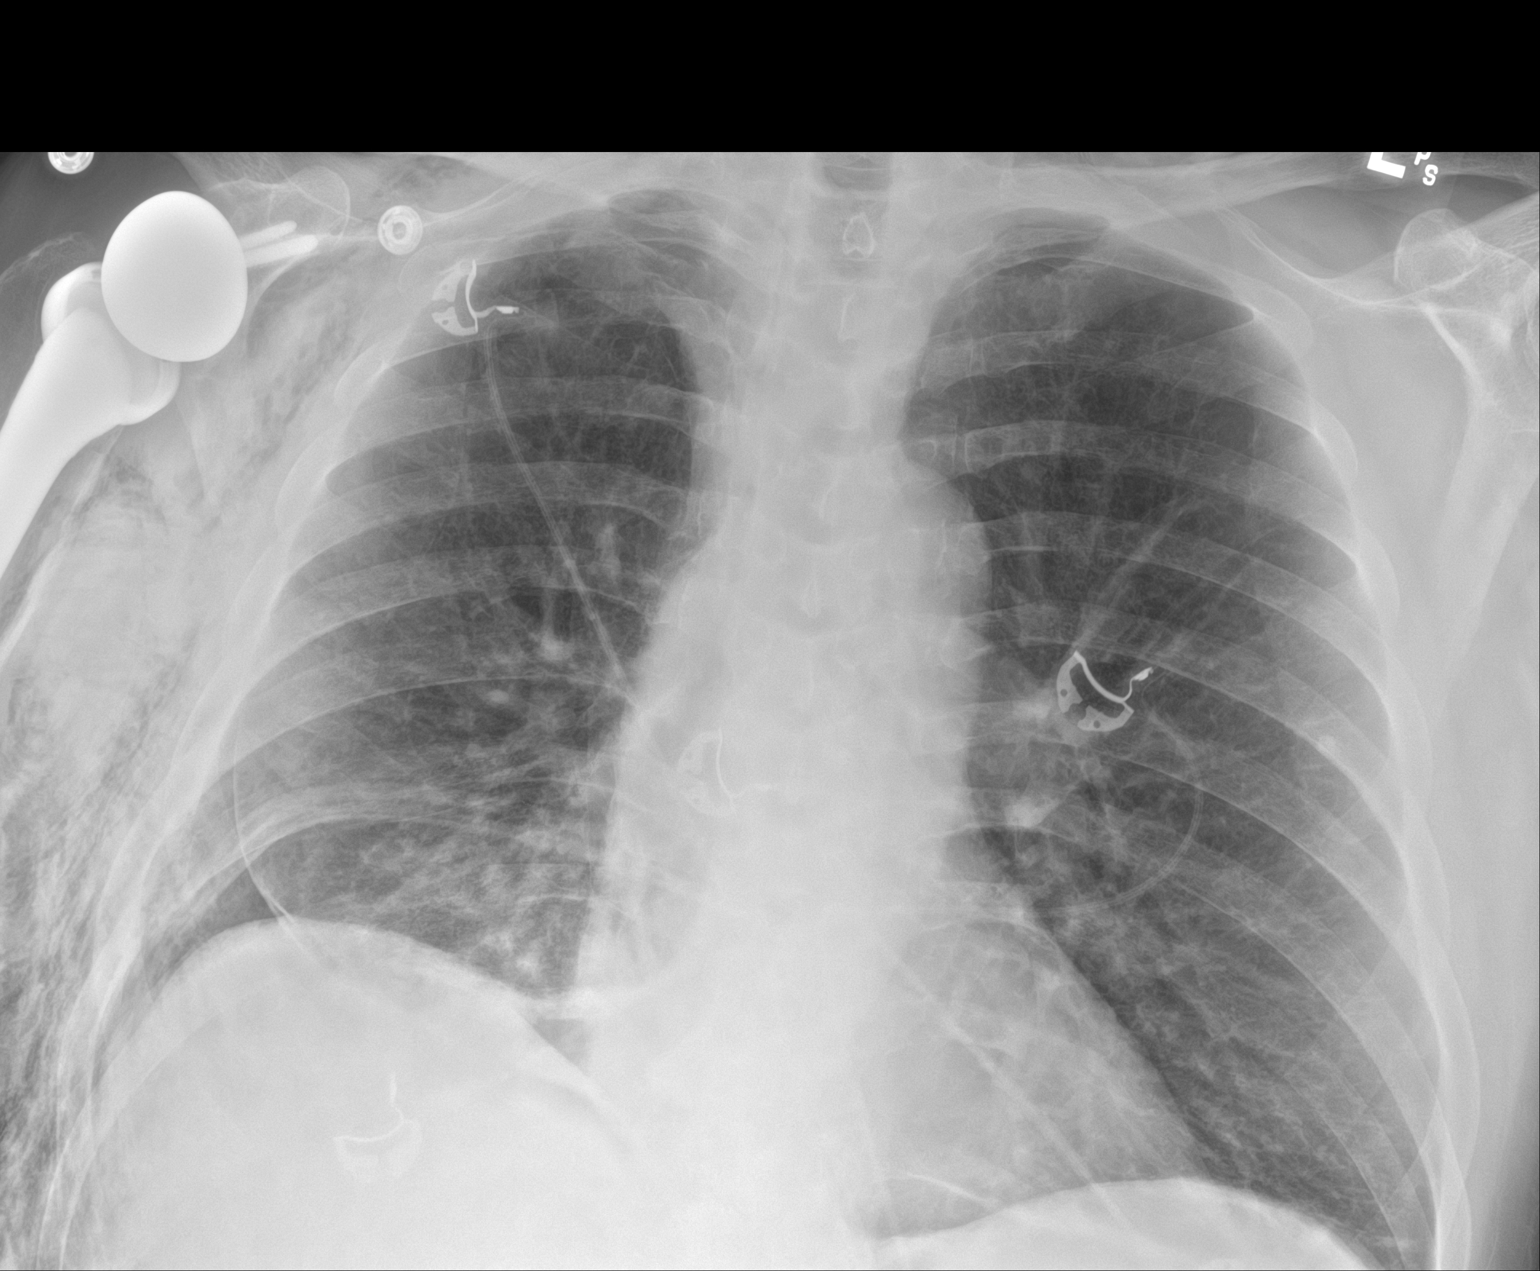

[1 of 1 positions shown; findings below may reference images not displayed]

FINDINGS: Similar position of a right basilar chest tube. There has been an
interval increase in size of a now small-to-moderate right
pneumothorax. The pneumothorax is now predominantly basilar.
Ill-defined opacity within the right lung base has not significantly
changed. This may reflect atelectasis and/or consolidation. Minimal
persistent atelectasis within the left lung base. No appreciable
pleural effusion. As before, there is extensive subcutaneous gas
within the right chest wall and lower right neck. Right shoulder
arthroplasty.

Attempts are being made to reach the ordering provider at this time.
IMPRESSION: Similar position of a right basilar chest tube as compared to the
chest radiograph of [DATE].

Interval increase in size of a now small-to-moderate right
pneumothorax. The pneumothorax is now predominantly basilar.

Persistent ill-defined opacity within the right lung base, which may
reflect atelectasis and/or consolidation.

Minimal persistent atelectasis within the left lung base.

As before, there is extensive subcutaneous gas within the right
chest wall and lower right neck.

ADDENDUM:
Impression #2 was called by telephone at the time of interpretation
on [DATE] at [DATE] to provider PA ULEMEK, who verbally
acknowledged these results.

*** End of Addendum ***
FINDINGS: Similar position of a right basilar chest tube. There has been an
interval increase in size of a now small-to-moderate right
pneumothorax. The pneumothorax is now predominantly basilar.
Ill-defined opacity within the right lung base has not significantly
changed. This may reflect atelectasis and/or consolidation. Minimal
persistent atelectasis within the left lung base. No appreciable
pleural effusion. As before, there is extensive subcutaneous gas
within the right chest wall and lower right neck. Right shoulder
arthroplasty.

Attempts are being made to reach the ordering provider at this time.
IMPRESSION: Similar position of a right basilar chest tube as compared to the
chest radiograph of [DATE].

Interval increase in size of a now small-to-moderate right
pneumothorax. The pneumothorax is now predominantly basilar.

Persistent ill-defined opacity within the right lung base, which may
reflect atelectasis and/or consolidation.

Minimal persistent atelectasis within the left lung base.

As before, there is extensive subcutaneous gas within the right
chest wall and lower right neck.

## 2021-05-25 MED ORDER — MAGNESIUM HYDROXIDE 400 MG/5ML PO SUSP
30.0000 mL | Freq: Every day | ORAL | Status: DC | PRN
  Administered 2021-05-25 – 2021-05-29 (×4): 30 mL via ORAL
  Filled 2021-05-25 (×4): qty 30

## 2021-05-25 NOTE — Progress Notes (Addendum)
[place] chest drain changed as ordered. Chest tube to water seal with intermittent/ when coughing level 5 moderate leak. Chest tube dressing changed [place] Daywalt, Bettina Gavia RN

## 2021-05-25 NOTE — Plan of Care (Signed)
°  Problem: Clinical Measurements: Goal: Respiratory complications will improve Outcome: Progressing   Problem: Activity: Goal: Risk for activity intolerance will decrease Outcome: Progressing   Problem: Health Behavior/Discharge Planning: Goal: Ability to manage health-related needs will improve Outcome: Not Progressing

## 2021-05-25 NOTE — Care Management Important Message (Signed)
Important Message  Patient Details  Name: Stephen Diaz MRN: [id number] Date of Birth: 1955-07-27   Medicare Important Message Given:  Yes     Eljay, Lave 05/25/2021, 10:41 AM

## 2021-05-25 NOTE — Progress Notes (Addendum)
[address]       [place] [address]             [phone]      5 Days Post-Op Procedure(s) (LRB): XI ROBOTIC ASSISTED THORASCOPY-RIGHT LOWER LOBECTOMY (Right) LYMPH NODE DISSECTION (Right) INTERCOSTAL NERVE BLOCK (Right) Subjective: + sputum production, some constipation  Objective: Vital signs in last 24 hours: Temp:  [97.9 F (36.6 C)-98.4 F (36.9 C)] 97.9 F (36.6 C) (12/14 0445) Pulse Rate:  [78-113] 78 (12/14 0445) Cardiac Rhythm: Normal sinus rhythm;Sinus tachycardia (12/13 2100) Resp:  [13-22] 13 (12/14 0445) BP: (94-126)/(61-82) 110/72 (12/14 0445) SpO2:  [92 %-98 %] 98 % (12/14 0445)  Hemodynamic parameters for last 24 hours:    Intake/Output from previous day: 12/13 0701 - 12/14 0700 In: 358 [P.O.:358] Out: 3070 [Urine:1250; Chest Tube:1820] Intake/Output this shift: No intake/output data recorded.  General appearance: alert, cooperative, and no distress Heart: regular rate and rhythm Lungs: no ronchi or wheeze Abdomen: benign Extremities: no edema or calf tenderness Wound: incis healing well  Lab Results: Recent Labs    05/23/21 0220  WBC 13.6*  HGB 12.0*  HCT 36.4*  PLT 271   BMET:  Recent Labs    05/23/21 0220  NA 132*  K 4.2  CL 97*  CO2 27  GLUCOSE 162*  BUN 13  CREATININE 0.68  CALCIUM 8.3*    PT/INR: No results for input(s): LABPROT, INR in the last 72 hours. ABG    Component Value Date/Time   PHART 7.405 05/18/2021 1450   HCO3 23.9 05/18/2021 1450   ACIDBASEDEF 0.2 05/18/2021 1450   O2SAT 96.8 05/18/2021 1450   CBG (last 3)  Recent Labs    05/24/21 1556 05/24/21 2115 05/25/21 0653  GLUCAP 120* 129* 126*    Meds Scheduled Meds:  aspirin EC  81 mg Oral Daily   bisacodyl  10 mg Oral Daily   budesonide (PULMICORT) nebulizer solution  0.25 mg Nebulization BID   Chlorhexidine Gluconate Cloth  6 each Topical Daily   enoxaparin (LOVENOX) injection  40 mg Subcutaneous Daily   gabapentin  100  mg Oral BID   guaiFENesin  1,200 mg Oral BID   levalbuterol  1.25 mg Nebulization BID   lisinopril  10 mg Oral Daily   mouth rinse  15 mL Mouth Rinse BID   metFORMIN  1,000 mg Oral Q breakfast   pantoprazole  40 mg Oral Daily   polyethylene glycol  17 g Oral Daily   revefenacin  175 mcg Nebulization Daily   senna-docusate  1 tablet Oral QHS   tamsulosin  0.4 mg Oral Daily   varenicline  1 mg Oral BID   Continuous Infusions: PRN Meds:.levalbuterol, morphine injection, ondansetron (ZOFRAN) IV, oxyCODONE  Xrays DG Chest Port 1 View  Result Date: 05/24/2021 CLINICAL DATA:  Right chest tube in place difficulty breathing EXAM: PORTABLE CHEST 1 VIEW COMPARISON:  Previous studies including the examination of 05/23/2021 FINDINGS: Tip of right chest tube is noted in the medial right lower lung fields. There is possible minimal right apical pneumothorax. Extensive subcutaneous emphysema is seen in the right chest wall and neck with possible worsening. There are increased markings in both lower lung fields, more so on the right side with interval worsening. There are no signs of pulmonary edema or focal pulmonary consolidation. Possible calcified granuloma is seen in the right upper lung fields. There is previous right shoulder arthroplasty. IMPRESSION: Right chest tube is noted in place.  There is possible small right apical pneumothorax. Increased markings are seen in both lower lung fields, more so on the right side with interval worsening. This finding may suggest subsegmental atelectasis/pneumonitis. Electronically Signed   By: Elmer Picker M.D.   On: 05/24/2021 08:19    Assessment/Plan: S/P Procedure(s) (LRB): XI ROBOTIC ASSISTED THORASCOPY-RIGHT LOWER LOBECTOMY (Right) LYMPH NODE DISSECTION (Right) INTERCOSTAL NERVE BLOCK (Right)  1 afeb, VSS 2 sats good on RA- 2 liters, conts nebs/pulm hygiene- prob bronchitis but managing secretions well 3 significant increase in CT output 1820 cc(   serosang) , CXR pending. + air leak , on H2O seal for now 4 good UOP 5 no new labs 6 BS control is reasonable 8 routine rehab 9 add MOM for constipation      LOS: 5 days    John Giovanni PA-C Pager [phone] 05/25/2021   Patient seen and examined. He has a small basilar space and a lot of tidal motion with relatively small air leak, will leave tube on water seal I/O s obviously recorded incorrectly with UO and CT output transposed for 1st shift yesterday.  Path still pending Ambulate  Revonda Standard. Roxan Hockey, MD [facility] [phone]

## 2021-05-26 ENCOUNTER — Inpatient Hospital Stay (HOSPITAL_COMMUNITY): Payer: Medicare Other

## 2021-05-26 LAB — CBC
HCT: 35.9 % — ABNORMAL LOW (ref 39.0–52.0)
Hemoglobin: 11.7 g/dL — ABNORMAL LOW (ref 13.0–17.0)
MCH: 28.5 pg (ref 26.0–34.0)
MCHC: 32.6 g/dL (ref 30.0–36.0)
MCV: 87.3 fL (ref 80.0–100.0)
Platelets: 327 10*3/uL (ref 150–400)
RBC: 4.11 MIL/uL — ABNORMAL LOW (ref 4.22–5.81)
RDW: 13.2 % (ref 11.5–15.5)
WBC: 9.6 10*3/uL (ref 4.0–10.5)
nRBC: 0 % (ref 0.0–0.2)

## 2021-05-26 LAB — SURGICAL PATHOLOGY

## 2021-05-26 LAB — GLUCOSE, CAPILLARY
Glucose-Capillary: 151 mg/dL — ABNORMAL HIGH (ref 70–99)
Glucose-Capillary: 138 mg/dL — ABNORMAL HIGH (ref 70–99)
Glucose-Capillary: 127 mg/dL — ABNORMAL HIGH (ref 70–99)
Glucose-Capillary: 139 mg/dL — ABNORMAL HIGH (ref 70–99)

## 2021-05-26 LAB — BASIC METABOLIC PANEL
Anion gap: 9 (ref 5–15)
BUN: 12 mg/dL (ref 8–23)
CO2: 28 mmol/L (ref 22–32)
Calcium: 8.4 mg/dL — ABNORMAL LOW (ref 8.9–10.3)
Chloride: 94 mmol/L — ABNORMAL LOW (ref 98–111)
Creatinine, Ser: 0.63 mg/dL (ref 0.61–1.24)
GFR, Estimated: >60 mL/min (ref >60)
Glucose, Bld: 130 mg/dL — ABNORMAL HIGH (ref 70–99)
Potassium: 4.4 mmol/L (ref 3.5–5.1)
Sodium: 131 mmol/L — ABNORMAL LOW (ref 135–145)

## 2021-05-26 IMAGING — DX DG CHEST 2V
2 series · 2 of 2 positions shown · non-contrast
Comparison: [DATE]

CLINICAL DATA: Pneumothorax, follow-up

EXAM:
CHEST - 2 VIEW

[chest lat]
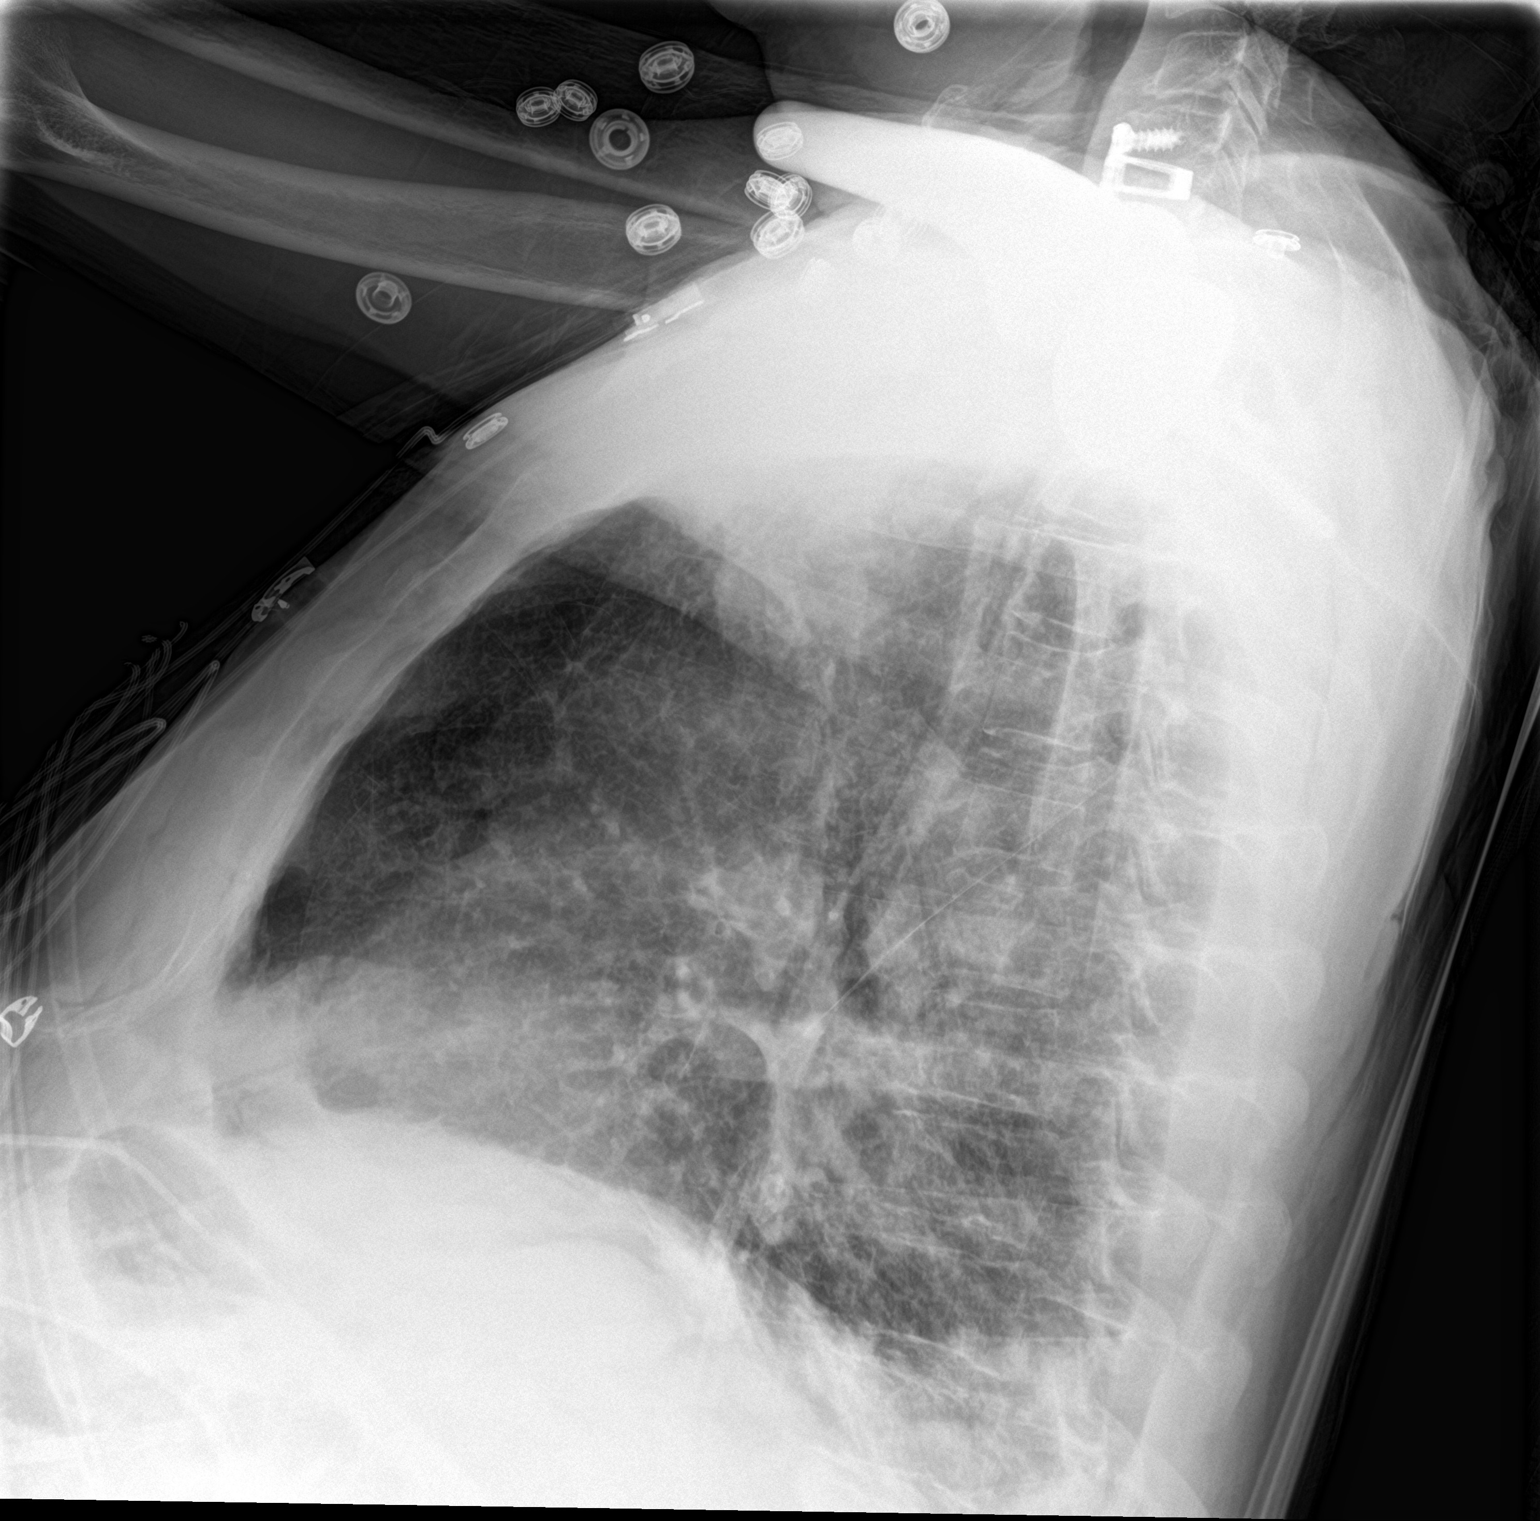

[chest ap]
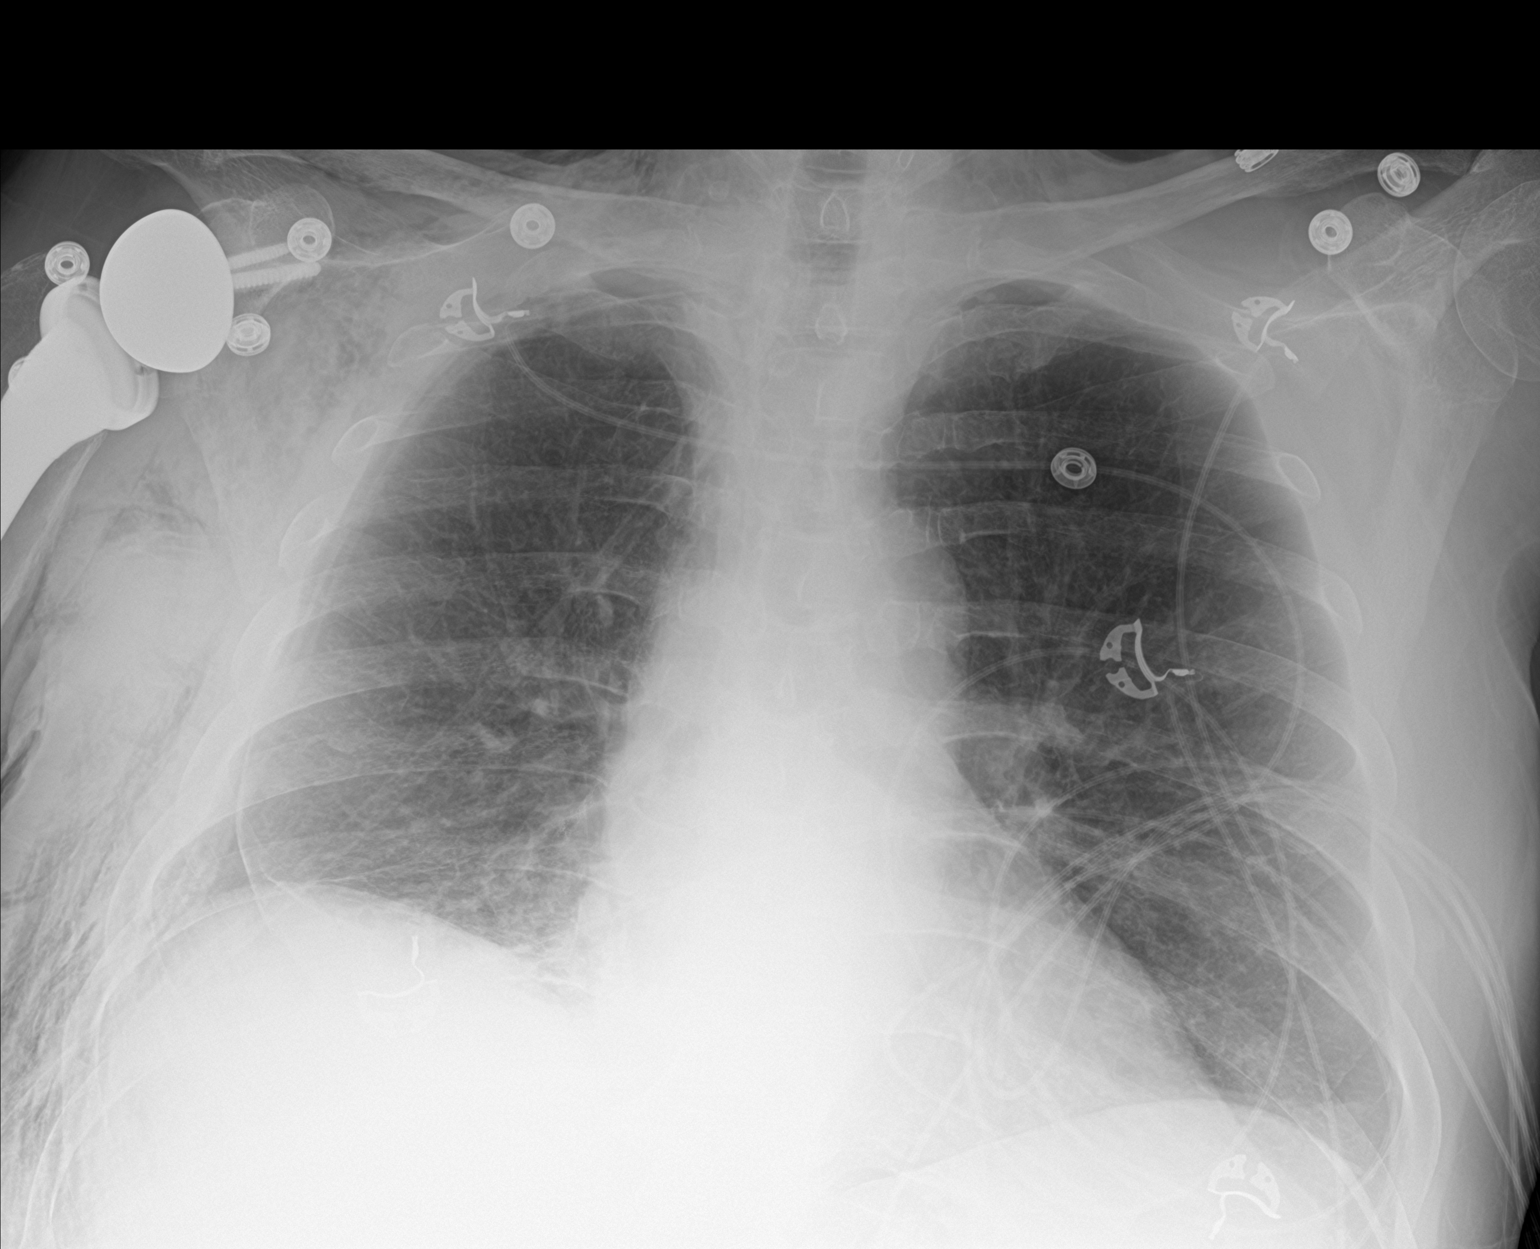

[2 of 2 positions shown; findings below may reference images not displayed]

FINDINGS: Right basilar chest tube is not as well seen. No substantial change
in right pneumothorax. Similar subcutaneous emphysema. Similar lung
aeration with interstitial changes and bibasilar atelectasis. Stable
cardiomediastinal contours.
IMPRESSION: No substantial change in right pneumothorax. Similar subcutaneous
emphysema. Lung aeration is also similar.

## 2021-05-26 MED ORDER — ACETAMINOPHEN 500 MG PO TABS
1000.0000 mg | ORAL_TABLET | Freq: Four times a day (QID) | ORAL | Status: DC | PRN
  Administered 2021-05-26: 1000 mg via ORAL
  Filled 2021-05-26 (×2): qty 2

## 2021-05-26 NOTE — Progress Notes (Addendum)
° °   °  [address]       [place] [address]             [phone]      6 Days Post-Op Procedure(s) (LRB): XI ROBOTIC ASSISTED THORASCOPY-RIGHT LOWER LOBECTOMY (Right) LYMPH NODE DISSECTION (Right) INTERCOSTAL NERVE BLOCK (Right)  Subjective:  The patient feels better after moving his bowels this morning.  Is requesting a breathing treatment.  Objective: Vital signs in last 24 hours: Temp:  [98 F (36.7 C)-99.2 F (37.3 C)] 98 F (36.7 C) (12/15 0333) Pulse Rate:  [92-114] 92 (12/15 0333) Cardiac Rhythm: Sinus tachycardia;Bundle branch block (12/15 0810) Resp:  [15-20] 20 (12/15 0333) BP: (96-114)/(61-75) 98/62 (12/15 0333) SpO2:  [92 %-100 %] 97 % (12/15 0333)  General appearance: alert, cooperative, and no distress Heart: regular rate and rhythm Lungs: wheezes bilaterally Abdomen: soft, non-tender; bowel sounds normal; no masses,  no organomegaly Extremities: extremities normal, atraumatic, no cyanosis or edema Wound: clean, some serous drainage around chest tube site  Lab Results: Recent Labs    05/26/21 0329  WBC 9.6  HGB 11.7*  HCT 35.9*  PLT 327   BMET:  Recent Labs    05/26/21 0329  NA 131*  K 4.4  CL 94*  CO2 28  GLUCOSE 130*  BUN 12  CREATININE 0.63  CALCIUM 8.4*    PT/INR: No results for input(s): LABPROT, INR in the last 72 hours. ABG    Component Value Date/Time   PHART 7.405 05/18/2021 1450   HCO3 23.9 05/18/2021 1450   ACIDBASEDEF 0.2 05/18/2021 1450   O2SAT 96.8 05/18/2021 1450   CBG (last 3)  Recent Labs    05/25/21 0653 05/25/21 2125 05/26/21 0634  GLUCAP 126* 213* 151*    Assessment/Plan: S/P Procedure(s) (LRB): XI ROBOTIC ASSISTED THORASCOPY-RIGHT LOWER LOBECTOMY (Right) LYMPH NODE DISSECTION (Right) INTERCOSTAL NERVE BLOCK (Right)  CV- NSR, BP stable- on Zestril Pulm- CXR shows some improvement in sub q emphysema... stable right basilar pneumothorax... CT on water seal with moderate air leak, CT  output 220 cc yesterday... continue nebulizers as scheduled Tobacco Abuse- on Chantix GI- constipation resolved DM- sugars mostly controlled on Metformin DIspo- patient with persistent air leak on water seal.. CXR with improved sub q emphysema, basilar space also remains stable.. leave chest tube in place today, continue chantix for tobacco abuse, repeat CXR in AM   LOS: 6 days    /Erin Barrett, PA-C 05/26/2021  Patient seen and examined, agree with above Moderate air leak with initial cough but decreased after that. Path T2a, N0, stage IB May consider dc with tube vs IBV if air leak persists  Remo Lipps C. Roxan Hockey, MD [facility] [phone]

## 2021-05-27 ENCOUNTER — Inpatient Hospital Stay (HOSPITAL_COMMUNITY): Payer: Medicare Other

## 2021-05-27 LAB — GLUCOSE, CAPILLARY
Glucose-Capillary: 144 mg/dL — ABNORMAL HIGH (ref 70–99)
Glucose-Capillary: 116 mg/dL — ABNORMAL HIGH (ref 70–99)
Glucose-Capillary: 207 mg/dL — ABNORMAL HIGH (ref 70–99)
Glucose-Capillary: 134 mg/dL — ABNORMAL HIGH (ref 70–99)

## 2021-05-27 IMAGING — DX DG CHEST 1V PORT
1 series · 1 of 1 positions shown · non-contrast
Comparison: [DATE].

CLINICAL DATA: Status post right lobectomy.

EXAM:
PORTABLE CHEST 1 VIEW

[chest ap]
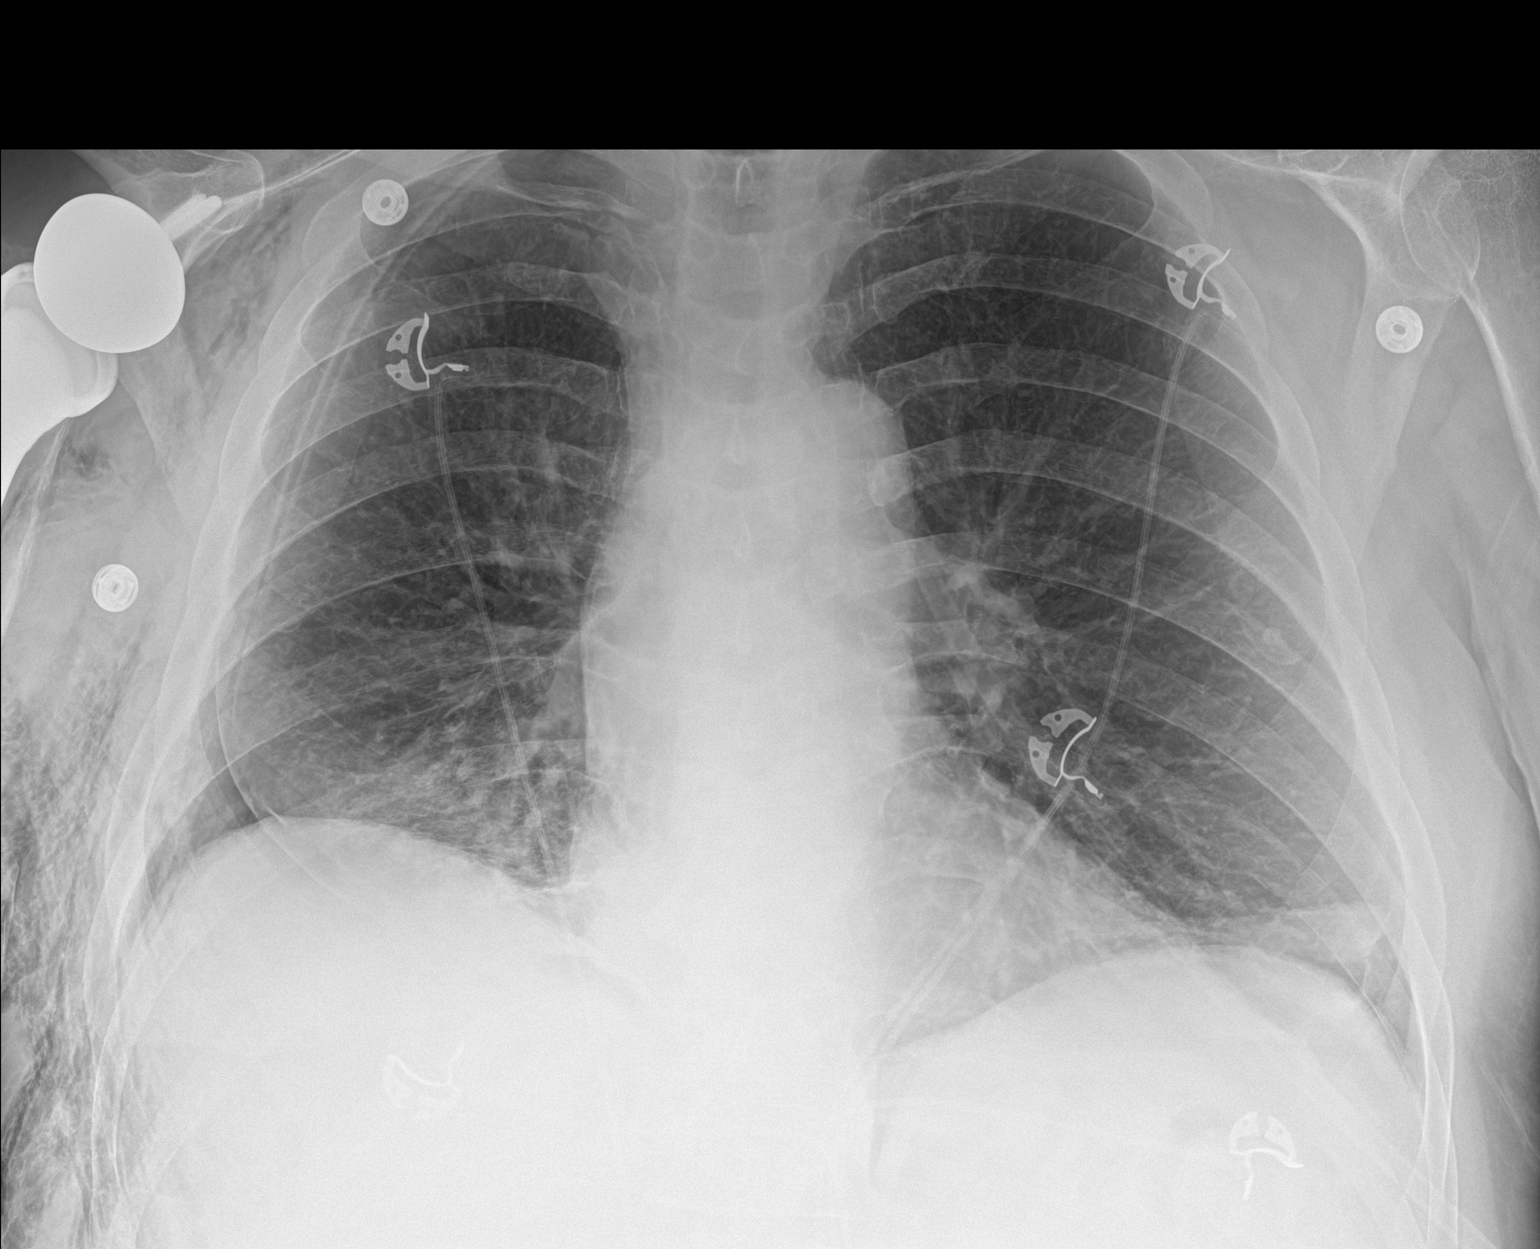

[1 of 1 positions shown; findings below may reference images not displayed]

FINDINGS: The heart size and mediastinal contours are within normal limits.
Left lung is clear. Stable position of right-sided chest tube.
Stable right apical and basilar pneumothorax is noted. Subcutaneous
emphysema is again noted over right lateral chest wall and
supraclavicular region. Status post right shoulder arthroplasty.
IMPRESSION: Stable position of right-sided chest tube. Stable right-sided
pneumothorax. Stable subcutaneous

## 2021-05-27 MED ORDER — IBUPROFEN 400 MG PO TABS
800.0000 mg | ORAL_TABLET | Freq: Four times a day (QID) | ORAL | Status: DC | PRN
  Administered 2021-05-27 – 2021-05-30 (×3): 800 mg via ORAL
  Filled 2021-05-27 (×4): qty 2

## 2021-05-27 NOTE — Progress Notes (Signed)
Pt called out concerned about sudden sharp pain around R scapula after coughing fit. Right lower lung sounds diminished similar to prior assessment.  Level 5 intermittent air leak present when coughing, also unchanged from prior assessment. Chest tube and dressing appear normal. Sats 94% on 2L . Pt states he is slightly more short of breath than normal secondary to coughing and pain. Getting routine AM chest xray at this time.  Encouraged pt to speak w/ rounding PA/MD of concerns. PRN pain meds given. All other needs met.  Jaymes Graff, RN

## 2021-05-27 NOTE — Progress Notes (Signed)
Right CT placed on 20cm regular wall suction per order.  Will cont plan of care.

## 2021-05-27 NOTE — Care Management Important Message (Signed)
Important Message  Patient Details  Name: Stephen Diaz MRN: [id number] Date of Birth: April 26, 1956   Medicare Important Message Given:  Yes     Krystopher, Kuenzel 05/27/2021, 10:03 AM

## 2021-05-27 NOTE — Progress Notes (Addendum)
° °   °  [address]       [place] [address]             [phone]      7 Days Post-Op Procedure(s) (LRB): XI ROBOTIC ASSISTED THORASCOPY-RIGHT LOWER LOBECTOMY (Right) LYMPH NODE DISSECTION (Right) INTERCOSTAL NERVE BLOCK (Right)  Subjective:  Patient complains that he doesn't want tylenol.  Nursing called yesterday stating patient wanted a non narcotic pain medication Tylenol was started at that time.  The patient also doesn't want Oxycodone stopped.  He asked for ibuprofen.  The patient also again states he does not want to go home with a chest tube in place  Objective: Vital signs in last 24 hours: Temp:  [97.5 F (36.4 C)-98.1 F (36.7 C)] 98 F (36.7 C) (12/16 0343) Pulse Rate:  [87-95] 92 (12/16 0343) Cardiac Rhythm: Normal sinus rhythm (12/15 1900) Resp:  [16-20] 19 (12/16 0343) BP: (102-125)/(69-86) 125/86 (12/16 0343) SpO2:  [93 %-100 %] 93 % (12/16 0343)  Intake/Output from previous day: 12/15 0701 - 12/16 0700 In: -  Out: 535 [Urine:500; Chest Tube:35]  General appearance: alert, cooperative, and no distress Heart: regular rate and rhythm Lungs: diminished breath sounds bibasilar Abdomen: soft, non-tender; bowel sounds normal; no masses,  no organomegaly Wound: clean and dry  Lab Results: Recent Labs    05/26/21 0329  WBC 9.6  HGB 11.7*  HCT 35.9*  PLT 327   BMET:  Recent Labs    05/26/21 0329  NA 131*  K 4.4  CL 94*  CO2 28  GLUCOSE 130*  BUN 12  CREATININE 0.63  CALCIUM 8.4*    PT/INR: No results for input(s): LABPROT, INR in the last 72 hours. ABG    Component Value Date/Time   PHART 7.405 05/18/2021 1450   HCO3 23.9 05/18/2021 1450   ACIDBASEDEF 0.2 05/18/2021 1450   O2SAT 96.8 05/18/2021 1450   CBG (last 3)  Recent Labs    05/26/21 1558 05/26/21 2159 05/27/21 0601  GLUCAP 127* 139* 144*    Assessment/Plan: S/P Procedure(s) (LRB): XI ROBOTIC ASSISTED THORASCOPY-RIGHT LOWER LOBECTOMY (Right) LYMPH NODE  DISSECTION (Right) INTERCOSTAL NERVE BLOCK (Right)  CV- NSR, BP stable on Zestril Pulm- CT remains in place, moderate air leak persists.. new dressing applied.Marland KitchenMarland KitchenCXR shows sub q emphysema, basilar space remains stable Tobacco abuse- continue chantix Pain control- patient wanted non- narcotic yesterday, doesn't like tylenol, will add Ibuprofen as patient states this works well for him at home, continue Oxycodone as patient doesn't wish for this to be stopped DM- sugars controlled Dispo- patient stable, air leak persists and remains moderate, ?slight increase in sub q emphysema on CXR... basilar space is stable... patient is not interested in going home with a chest tube, pain medication adjusted.. continue current care repeat CXR in AM  LOS: 7 days    Ellwood Handler, PA-C 05/27/2021  Patient seen and examined, agree with above Will place tube back to suction to see if we can air leak to stop if lung fully expanded.  Revonda Standard Roxan Hockey, MD [facility] [phone]

## 2021-05-28 ENCOUNTER — Inpatient Hospital Stay (HOSPITAL_COMMUNITY): Payer: Medicare Other

## 2021-05-28 LAB — GLUCOSE, CAPILLARY
Glucose-Capillary: 130 mg/dL — ABNORMAL HIGH (ref 70–99)
Glucose-Capillary: 137 mg/dL — ABNORMAL HIGH (ref 70–99)
Glucose-Capillary: 113 mg/dL — ABNORMAL HIGH (ref 70–99)
Glucose-Capillary: 164 mg/dL — ABNORMAL HIGH (ref 70–99)

## 2021-05-28 IMAGING — DX DG CHEST 1V PORT
1 series · 1 of 1 positions shown · non-contrast
Comparison: [DATE] and older exams.

CLINICAL DATA: Shortness of breath. Follow-up exam. Right chest
tube no pneumothorax.

EXAM:
PORTABLE CHEST 1 VIEW

[chest ap]
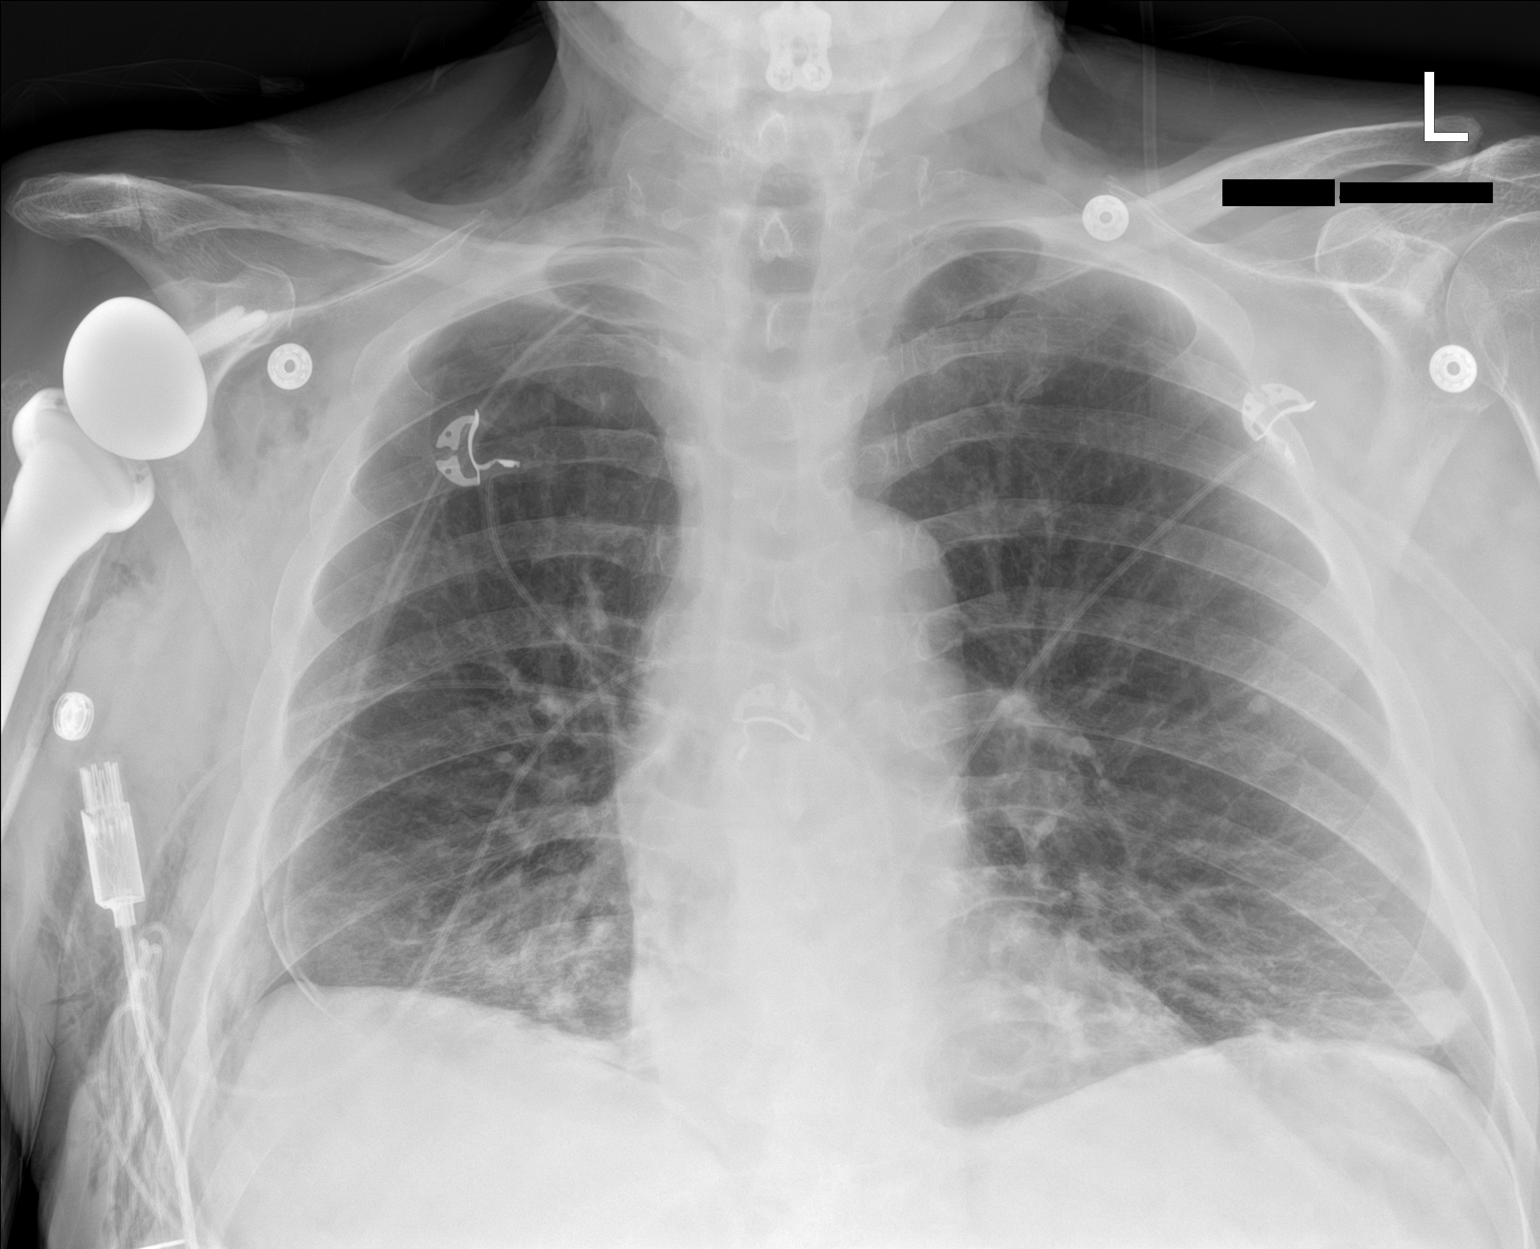

[1 of 1 positions shown; findings below may reference images not displayed]

FINDINGS: Small right-sided pneumothorax, most evident at the right lateral
lung base, mildly decreased in size from the previous day's study.

Right-sided chest tube is stable, tip at the right apex.

Persistent linear opacities at the lung bases consistent with
atelectasis. Remainder of the lungs is clear.

Right anterolateral chest wall and neck base subcutaneous emphysema
mildly decreased from the previous day's study.
IMPRESSION: 1. Mild interval decrease in the size of the right pneumothorax as
well as the right chest and neck base subcutaneous emphysema.
2. No other change. Mild persistent lung base atelectasis. Stable
right-sided chest tube.

## 2021-05-28 NOTE — Progress Notes (Addendum)
° °   °  [address]       [place] [address]             [phone]       8 Days Post-Op Procedure(s) (LRB): XI ROBOTIC ASSISTED THORASCOPY-RIGHT LOWER LOBECTOMY (Right) LYMPH NODE DISSECTION (Right) INTERCOSTAL NERVE BLOCK (Right)  Subjective:  No new complaints.  States he isn't walking with that walker its too short.  Objective: Vital signs in last 24 hours: Temp:  [97.6 F (36.4 C)-98.1 F (36.7 C)] 98.1 F (36.7 C) (12/17 0732) Pulse Rate:  [87-96] 96 (12/17 0732) Cardiac Rhythm: Normal sinus rhythm (12/16 1931) Resp:  [15-20] 19 (12/17 0732) BP: (99-116)/(68-91) 107/91 (12/17 0732) SpO2:  [93 %-97 %] 93 % (12/17 0759)  Intake/Output from previous day: 12/16 0701 - 12/17 0700 In: 200 [P.O.:200] Out: 600 [Urine:300; Chest Tube:300] Intake/Output this shift: Total I/O In: 240 [P.O.:240] Out: -   General appearance: alert, cooperative, and no distress Heart: regular rate and rhythm Lungs: clear to auscultation bilaterally Abdomen: soft, non-tender; bowel sounds normal; no masses,  no organomegaly Extremities: extremities normal, atraumatic, no cyanosis or edema Wound: clean and dry  Lab Results: Recent Labs    05/26/21 0329  WBC 9.6  HGB 11.7*  HCT 35.9*  PLT 327   BMET:  Recent Labs    05/26/21 0329  NA 131*  K 4.4  CL 94*  CO2 28  GLUCOSE 130*  BUN 12  CREATININE 0.63  CALCIUM 8.4*    PT/INR: No results for input(s): LABPROT, INR in the last 72 hours. ABG    Component Value Date/Time   PHART 7.405 05/18/2021 1450   HCO3 23.9 05/18/2021 1450   ACIDBASEDEF 0.2 05/18/2021 1450   O2SAT 96.8 05/18/2021 1450   CBG (last 3)  Recent Labs    05/27/21 1534 05/27/21 2116 05/28/21 0604  GLUCAP 207* 134* 130*    Assessment/Plan: S/P Procedure(s) (LRB): XI ROBOTIC ASSISTED THORASCOPY-RIGHT LOWER LOBECTOMY (Right) LYMPH NODE DISSECTION (Right) INTERCOSTAL NERVE BLOCK (Right)  CV- NSR, H/O HTN- continue Zestril Pulm- CT  placed back on suction yesterday, CXR remains unchanged with basilar/apical space... 1+ with initial cough, then this subsided.. will leave chest tube to suction this weekend Tobacco abuse- on chantix DM- sugars controlled Dispo- patient stable, CT back on suction, CXR essentially unchanged, will repeat in AM, patient needs to ambulate in the hallways, will see if nursing can get him a taller walker   LOS: 8 days    Ellwood Handler, PA-C 05/28/2021   Chart reviewed, patient examined, agree with above. Still has small air leak. CXR shows small lateral basilar space at [employer]. Keep chest tube to suction.

## 2021-05-28 NOTE — Progress Notes (Signed)
Patient up to chair this AM. Call bell within reach. Oseas Detty, Bettina Gavia rN

## 2021-05-29 ENCOUNTER — Inpatient Hospital Stay (HOSPITAL_COMMUNITY): Payer: Medicare Other

## 2021-05-29 LAB — GLUCOSE, CAPILLARY
Glucose-Capillary: 134 mg/dL — ABNORMAL HIGH (ref 70–99)
Glucose-Capillary: 164 mg/dL — ABNORMAL HIGH (ref 70–99)
Glucose-Capillary: 130 mg/dL — ABNORMAL HIGH (ref 70–99)
Glucose-Capillary: 173 mg/dL — ABNORMAL HIGH (ref 70–99)

## 2021-05-29 NOTE — Progress Notes (Signed)
Patient ambulated in hallway with nursing staff and rolling walker. Distance ambulated is 470 feet. Back in room Mory Herrman, Bettina Gavia RN

## 2021-05-29 NOTE — Progress Notes (Addendum)
° °   °  [address]       [place] [address]             [phone]      9 Days Post-Op Procedure(s) (LRB): XI ROBOTIC ASSISTED THORASCOPY-RIGHT LOWER LOBECTOMY (Right) LYMPH NODE DISSECTION (Right) INTERCOSTAL NERVE BLOCK (Right)  Subjective:  Unpleasant.  Wants to go home.  Doesn't understand why this couldn't be fixed days ago.  Did not ambulate yesterday  Objective: Vital signs in last 24 hours: Temp:  [97.6 F (36.4 C)-98.1 F (36.7 C)] 97.6 F (36.4 C) (12/18 0757) Pulse Rate:  [84-104] 103 (12/18 0757) Cardiac Rhythm: Normal sinus rhythm (12/18 0700) Resp:  [13-20] 14 (12/18 0757) BP: (95-119)/(62-81) 119/65 (12/18 0757) SpO2:  [91 %-97 %] 94 % (12/18 0822)  Intake/Output from previous day: 12/17 0701 - 12/18 0700 In: 600 [P.O.:600] Out: 1200 [Urine:950; Chest Tube:250] Intake/Output this shift: Total I/O In: 240 [P.O.:240] Out: -   General appearance: alert, cooperative, and no distress Heart: regular rate and rhythm Lungs: clear to auscultation bilaterally Abdomen: soft, non-tender; bowel sounds normal; no masses,  no organomegaly Extremities: extremities normal, atraumatic, no cyanosis or edema Wound: clean and dry  Lab Results: No results for input(s): WBC, HGB, HCT, PLT in the last 72 hours. BMET: No results for input(s): NA, K, CL, CO2, GLUCOSE, BUN, CREATININE, CALCIUM in the last 72 hours.  PT/INR: No results for input(s): LABPROT, INR in the last 72 hours. ABG    Component Value Date/Time   PHART 7.405 05/18/2021 1450   HCO3 23.9 05/18/2021 1450   ACIDBASEDEF 0.2 05/18/2021 1450   O2SAT 96.8 05/18/2021 1450   CBG (last 3)  Recent Labs    05/28/21 1651 05/28/21 2101 05/29/21 0613  GLUCAP 113* 164* 134*    Assessment/Plan: S/P Procedure(s) (LRB): XI ROBOTIC ASSISTED THORASCOPY-RIGHT LOWER LOBECTOMY (Right) LYMPH NODE DISSECTION (Right) INTERCOSTAL NERVE BLOCK (Right)  CV- NSR, H/O HTN- continue Zestril Pulm- CT on  suction, + air leak with cough, CXR remains unchanged Tobacco abuse- on Chantix DM- sugars controlled Dispo- patient stable, CT back on suction, CXR remains stable and unchanged, continue current care, Dr. Roxan Hockey will assess need for IBV in AM   LOS: 9 days   Stephen Handler, PA-C 05/29/2021   Chart reviewed, patient examined, agree with above. Still has small air leak with coughing. CXR stable with small right lateral basilar space. Will need to see how [facility] wants to manage this.

## 2021-05-30 ENCOUNTER — Inpatient Hospital Stay (HOSPITAL_COMMUNITY): Payer: Medicare Other

## 2021-05-30 ENCOUNTER — Other Ambulatory Visit: Payer: Self-pay | Admitting: Thoracic Surgery (Cardiothoracic Vascular Surgery)

## 2021-05-30 DIAGNOSIS — R911 Solitary pulmonary nodule: Secondary | ICD-10-CM

## 2021-05-30 LAB — CBC
HCT: 38.9 % — ABNORMAL LOW (ref 39.0–52.0)
Hemoglobin: 12.8 g/dL — ABNORMAL LOW (ref 13.0–17.0)
MCH: 28.3 pg (ref 26.0–34.0)
MCHC: 32.9 g/dL (ref 30.0–36.0)
MCV: 86.1 fL (ref 80.0–100.0)
Platelets: 477 10*3/uL — ABNORMAL HIGH (ref 150–400)
RBC: 4.52 MIL/uL (ref 4.22–5.81)
RDW: 12.9 % (ref 11.5–15.5)
WBC: 24.2 10*3/uL — ABNORMAL HIGH (ref 4.0–10.5)
nRBC: 0 % (ref 0.0–0.2)

## 2021-05-30 LAB — BASIC METABOLIC PANEL
Anion gap: 9 (ref 5–15)
BUN: 31 mg/dL — ABNORMAL HIGH (ref 8–23)
CO2: 22 mmol/L (ref 22–32)
Calcium: 8.2 mg/dL — ABNORMAL LOW (ref 8.9–10.3)
Chloride: 93 mmol/L — ABNORMAL LOW (ref 98–111)
Creatinine, Ser: 1.57 mg/dL — ABNORMAL HIGH (ref 0.61–1.24)
GFR, Estimated: 48 mL/min — ABNORMAL LOW (ref >60)
Glucose, Bld: 144 mg/dL — ABNORMAL HIGH (ref 70–99)
Potassium: 5.1 mmol/L (ref 3.5–5.1)
Sodium: 124 mmol/L — ABNORMAL LOW (ref 135–145)

## 2021-05-30 LAB — EXPECTORATED SPUTUM ASSESSMENT W GRAM STAIN, RFLX TO RESP C

## 2021-05-30 LAB — GLUCOSE, CAPILLARY
Glucose-Capillary: 148 mg/dL — ABNORMAL HIGH (ref 70–99)
Glucose-Capillary: 159 mg/dL — ABNORMAL HIGH (ref 70–99)
Glucose-Capillary: 139 mg/dL — ABNORMAL HIGH (ref 70–99)
Glucose-Capillary: 139 mg/dL — ABNORMAL HIGH (ref 70–99)

## 2021-05-30 IMAGING — DX DG CHEST 1V PORT
1 series · 1 of 1 positions shown · non-contrast
Comparison: [DATE]

CLINICAL DATA: Pneumothorax

EXAM:
PORTABLE CHEST 1 VIEW

[chest ap]
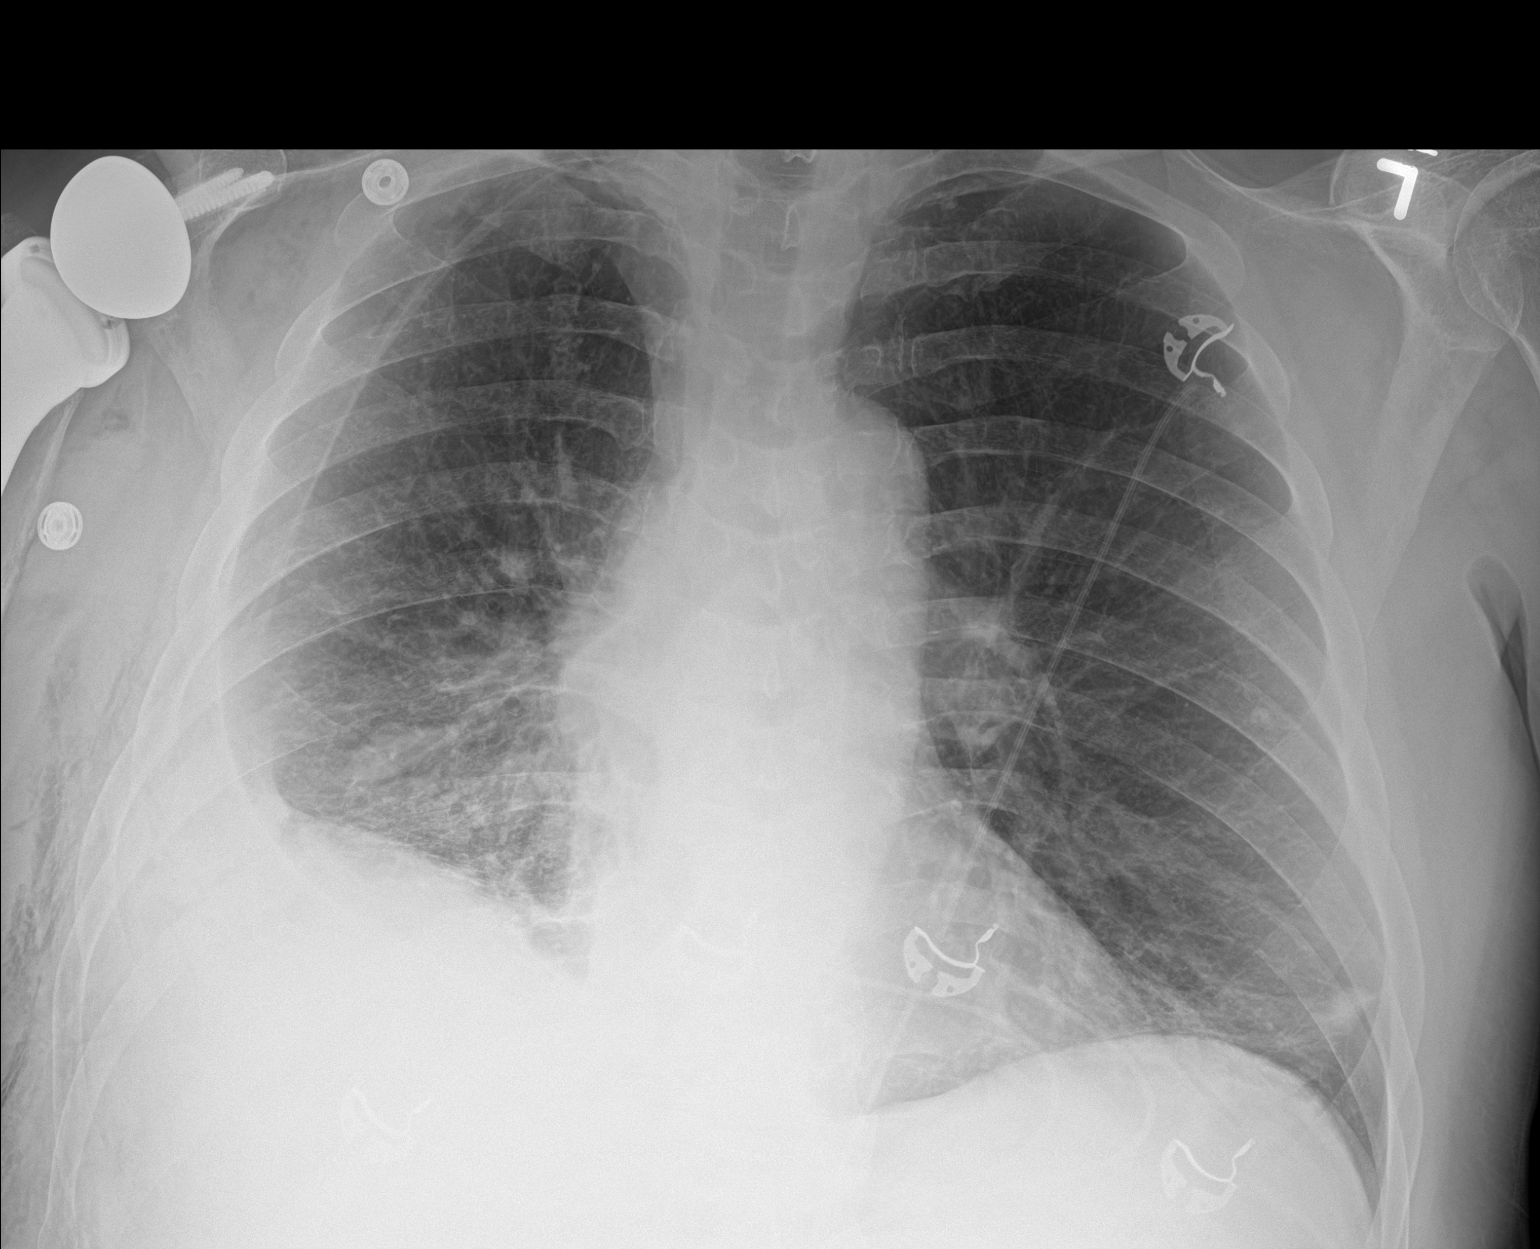

[1 of 1 positions shown; findings below may reference images not displayed]

FINDINGS: Right chest tube remains in place. Small right pleural effusion,
increasing since prior study. Effusion now fills the previously seen
pneumothorax at the right lung base. Small residual apical component
of the pneumothorax noted. Bibasilar atelectasis. Heart is normal
size.
IMPRESSION: Right chest tube remains in place, unchanged. Small residual right
apical pneumothorax. The previously seen basilar component is now
filled with fluid/effusion.

Bibasilar atelectasis.

## 2021-05-30 MED ORDER — SODIUM CHLORIDE 0.45 % IV SOLN: INTRAVENOUS | Status: DC

## 2021-05-30 MED ORDER — PIPERACILLIN-TAZOBACTAM 3.375 G IVPB
3.3750 g | Freq: Three times a day (TID) | INTRAVENOUS | Status: DC
Start: 2021-05-30 — End: 2021-05-31
  Administered 2021-05-30 – 2021-05-31 (×2): 3.375 g via INTRAVENOUS
  Filled 2021-05-30 (×2): qty 50

## 2021-05-30 MED ORDER — SODIUM CHLORIDE 0.9 % IV BOLUS: 500.0000 mL | Freq: Once | INTRAVENOUS | Status: DC

## 2021-05-30 MED ORDER — SODIUM CHLORIDE 0.9 % IV BOLUS
500.0000 mL | Freq: Once | INTRAVENOUS | Status: AC
  Administered 2021-05-30: 16:00:00 500 mL via INTRAVENOUS

## 2021-05-30 MED ORDER — VANCOMYCIN HCL 1500 MG/300ML IV SOLN
1500.0000 mg | INTRAVENOUS | Status: DC
  Administered 2021-05-30: 21:00:00 1500 mg via INTRAVENOUS
  Filled 2021-05-30: qty 300

## 2021-05-30 MED ORDER — SODIUM CHLORIDE 0.9 % IV SOLN: INTRAVENOUS | Status: DC

## 2021-05-30 NOTE — Significant Event (Signed)
Rapid Response Event Note   Reason for Call :  Hypotension   Initial Focused Assessment:  Patient is alert and oriented.  He states that he is very fatigued.  He is pale, cool to the touch, skin is dry. Lung sounds decreased bases, heart tones regular CBG 139 BP 63/43  HR 94  RR 20  O2 sat difficult to obtain Moved probe to ear  O2 sats 100% on 2L [place] Temp 97.8    Interventions:  500 cc NS bolus at 999cc/hr 1L NS bolus with pressure bag BP improved to 100/61 HR 93 Patient states he feels better  About 20 min later BP dropped to 85/57 CBC and Bmet resulted Junie Panning PA notified of patient status Dr Roxan Hockey at bedside to assess patient  Additional 500cc NS bolus BP improved to 90-100/60s  Plan of Care:  Start abx MIVF at 100cc/hr RN to call if patient becomes hypotensive again or severely fatigued.     Event Summary:   MD Notified: Junie Panning PA/ Dr Roxan Hockey Call Time: 1532 Arrival Time: 7670 End Time: Berdie Ogren, RN

## 2021-05-30 NOTE — Progress Notes (Addendum)
Upon vital sign check patient BP 72/46, 64/48 and when manualy checked systolic in the 11A patient asymptomatic at this time other than being sleepy/lethargic. Patient with approximately 228ml output  in chest tube, chest tube currently to water seal . Erin Barrett PAC on call and made aware orders received. Rapid Response RN made aware of patient. Astin Sayre, Bettina Gavia RN  1540- 500 ml Normal saline bolus given as ordered. Lyndell Allaire, Bettina Gavia RN

## 2021-05-30 NOTE — Progress Notes (Addendum)
Dr. Roxan Hockey at bed side ordered 500 ML normal saline Bolus. This was given as ordered  Stephen Diaz, Bettina Gavia RN

## 2021-05-30 NOTE — Progress Notes (Addendum)
[address]       [place] [address]             [phone]      10 Days Post-Op Procedure(s) (LRB): XI ROBOTIC ASSISTED THORASCOPY-RIGHT LOWER LOBECTOMY (Right) LYMPH NODE DISSECTION (Right) INTERCOSTAL NERVE BLOCK (Right) Subjective: Feels pretty well  Objective: Vital signs in last 24 hours: Temp:  [97.6 F (36.4 C)-99.1 F (37.3 C)] 97.8 F (36.6 C) (12/19 0454) Pulse Rate:  [93-117] 93 (12/19 0454) Cardiac Rhythm: Sinus tachycardia (12/18 1939) Resp:  [14-19] 16 (12/19 0454) BP: (89-119)/(64-73) 89/65 (12/19 0454) SpO2:  [90 %-98 %] 90 % (12/19 0454)  Hemodynamic parameters for last 24 hours:    Intake/Output from previous day: 12/18 0701 - 12/19 0700 In: 600 [P.O.:600] Out: 890 [Urine:650; Chest Tube:240] Intake/Output this shift: No intake/output data recorded.  General appearance: alert, cooperative, and no distress Heart: regular rate and rhythm Lungs: dim right base Abdomen: benign Extremities: no calf tenderness or edema Wound: incis healing well  Lab Results: No results for input(s): WBC, HGB, HCT, PLT in the last 72 hours. BMET: No results for input(s): NA, K, CL, CO2, GLUCOSE, BUN, CREATININE, CALCIUM in the last 72 hours.  PT/INR: No results for input(s): LABPROT, INR in the last 72 hours. ABG    Component Value Date/Time   PHART 7.405 05/18/2021 1450   HCO3 23.9 05/18/2021 1450   ACIDBASEDEF 0.2 05/18/2021 1450   O2SAT 96.8 05/18/2021 1450   CBG (last 3)  Recent Labs    05/29/21 1639 05/29/21 2120 05/30/21 0614  GLUCAP 130* 173* 148*    Meds Scheduled Meds:  aspirin EC  81 mg Oral Daily   bisacodyl  10 mg Oral Daily   budesonide (PULMICORT) nebulizer solution  0.25 mg Nebulization BID   Chlorhexidine Gluconate Cloth  6 each Topical Daily   enoxaparin (LOVENOX) injection  40 mg Subcutaneous Daily   gabapentin  100 mg Oral BID   levalbuterol  1.25 mg Nebulization BID   lisinopril  10 mg Oral Daily   mouth  rinse  15 mL Mouth Rinse BID   metFORMIN  1,000 mg Oral Q breakfast   pantoprazole  40 mg Oral Daily   polyethylene glycol  17 g Oral Daily   revefenacin  175 mcg Nebulization Daily   senna-docusate  1 tablet Oral QHS   tamsulosin  0.4 mg Oral Daily   varenicline  1 mg Oral BID   Continuous Infusions: PRN Meds:.ibuprofen, levalbuterol, magnesium hydroxide, ondansetron (ZOFRAN) IV, oxyCODONE  Xrays DG CHEST PORT 1 VIEW  Result Date: 05/29/2021 CLINICAL DATA:  Status post lobectomy.  Follow-up study. EXAM: PORTABLE CHEST 1 VIEW COMPARISON:  05/28/2021 and older exams. FINDINGS: Small right-sided pneumothorax is without significant change, most evident the lateral lower hemithorax. Right chest tube is also stable. Lung base opacities have improved consistent with improved atelectasis. Remainder of the lungs is clear. No convincing pleural effusion. No left pneumothorax. IMPRESSION: 1. Small right pneumothorax without significant change from the previous day's study. Stable right-sided chest tube. 2. Improved lung base atelectasis.  No new lung abnormalities. Electronically Signed   By: Lajean Manes M.D.   On: 05/29/2021 08:12    Assessment/Plan: S/P Procedure(s) (LRB): XI ROBOTIC ASSISTED THORASCOPY-RIGHT LOWER LOBECTOMY (Right) LYMPH NODE DISSECTION (Right) INTERCOSTAL NERVE BLOCK (Right)  1 afeb, VSS S BP 89-1`19 2 sats ok on 2 liters 3 CXR - increased right effus, o/w stable 4 CT no air leak- poss  change to H2O seal , 240 cc drainage, serous 5 cont opulm RX, hygiene 6 routine rehab 7 BS adeq controlled  LOS: 10 days    John Giovanni PA-C Pager [phone] 05/30/2021   Patient seen and examined, agree with above No air leak this AM CXR shows some fluid filling basilar space Will place Ct to water seal If lung stays up and no air leak tomorrow, will dc tube and send home  Remo Lipps C. Roxan Hockey, MD [facility] [phone]

## 2021-05-30 NOTE — Progress Notes (Signed)
PA on call updated on patients current blood pressures and labs that are currently resulted current BP 94/66. Will monitor. Grayson Pfefferle, Bettina Gavia RN

## 2021-05-30 NOTE — Progress Notes (Signed)
° °   °  [address]       [place] [address]             [phone]      CTSP for low BP.  He was noted to have low blood pressure earlier. Improved to 90s with IV bolus  He has been afebrile but Labs show WBC up to 24K, creatinine up and sodium low  C/w infection with dehydration  On exam he is without complaint while laying in bed but has coughed up some thick mucous. He does not look toxic.   Will Iv hydrate and start empiric Vanco and Zosyn for presumed hospital acquired pneumonia.   Check sputum culture  Stephen Lipps C. Roxan Hockey, MD [facility] [phone]

## 2021-05-30 NOTE — Progress Notes (Signed)
Pharmacy Antibiotic Note  Stephen Diaz is a 65 y.o. male admitted on 05/20/2021 for lobectomy. Pt noted to have elevated WBC and hypotension so concern for PNA. Pharmacy has been consulted for vancomycin and Zosyn dosing.  Plan: Zosyn 3.375g IV EI q8h Vancomycin 1500mg  IV q24h - est AUC 489 Watch Cr closely F/U sputum culture   Height: 6\' 1"  (185.4 cm) Weight: 88.9 kg (195 lb 15.8 oz) IBW/kg (Calculated) : 79.9  Temp (24hrs), Avg:98.1 F (36.7 C), Min:97.8 F (36.6 C), Max:98.8 F (37.1 C)  Recent Labs  Lab 05/26/21 0329 05/30/21 1552  WBC 9.6 24.2*  CREATININE 0.63 1.57*    Estimated Creatinine Clearance: 52.3 mL/min (A) (by C-G formula based on SCr of 1.57 mg/dL (H)).    No Known Allergies  Antimicrobials this admission: Vancomycin 12/19 >>  Zosyn 12/19 >>   Dose adjustments this admission: N/a  Microbiology results: Pending  Arrie Senate, PharmD, BCPS, [facility] Clinical Pharmacist [phone] Please check AMION for all [place] numbers 05/30/2021

## 2021-05-31 ENCOUNTER — Inpatient Hospital Stay (HOSPITAL_COMMUNITY): Payer: Medicare Other

## 2021-05-31 ENCOUNTER — Ambulatory Visit: Payer: Medicare Other | Admitting: Thoracic Surgery (Cardiothoracic Vascular Surgery)

## 2021-05-31 LAB — BASIC METABOLIC PANEL
Anion gap: 9 (ref 5–15)
BUN: 15 mg/dL (ref 8–23)
CO2: 22 mmol/L (ref 22–32)
Calcium: 8.1 mg/dL — ABNORMAL LOW (ref 8.9–10.3)
Chloride: 95 mmol/L — ABNORMAL LOW (ref 98–111)
Creatinine, Ser: 0.8 mg/dL (ref 0.61–1.24)
GFR, Estimated: >60 mL/min (ref >60)
Glucose, Bld: 184 mg/dL — ABNORMAL HIGH (ref 70–99)
Potassium: 4.6 mmol/L (ref 3.5–5.1)
Sodium: 126 mmol/L — ABNORMAL LOW (ref 135–145)

## 2021-05-31 LAB — GLUCOSE, CAPILLARY
Glucose-Capillary: 138 mg/dL — ABNORMAL HIGH (ref 70–99)
Glucose-Capillary: 114 mg/dL — ABNORMAL HIGH (ref 70–99)
Glucose-Capillary: 126 mg/dL — ABNORMAL HIGH (ref 70–99)
Glucose-Capillary: 161 mg/dL — ABNORMAL HIGH (ref 70–99)

## 2021-05-31 IMAGING — DX DG CHEST 1V PORT
1 series · 1 of 1 positions shown · non-contrast
Comparison: [DATE].

CLINICAL DATA: Status post lobectomy of lung.

EXAM:
PORTABLE CHEST 1 VIEW

[chest ap]
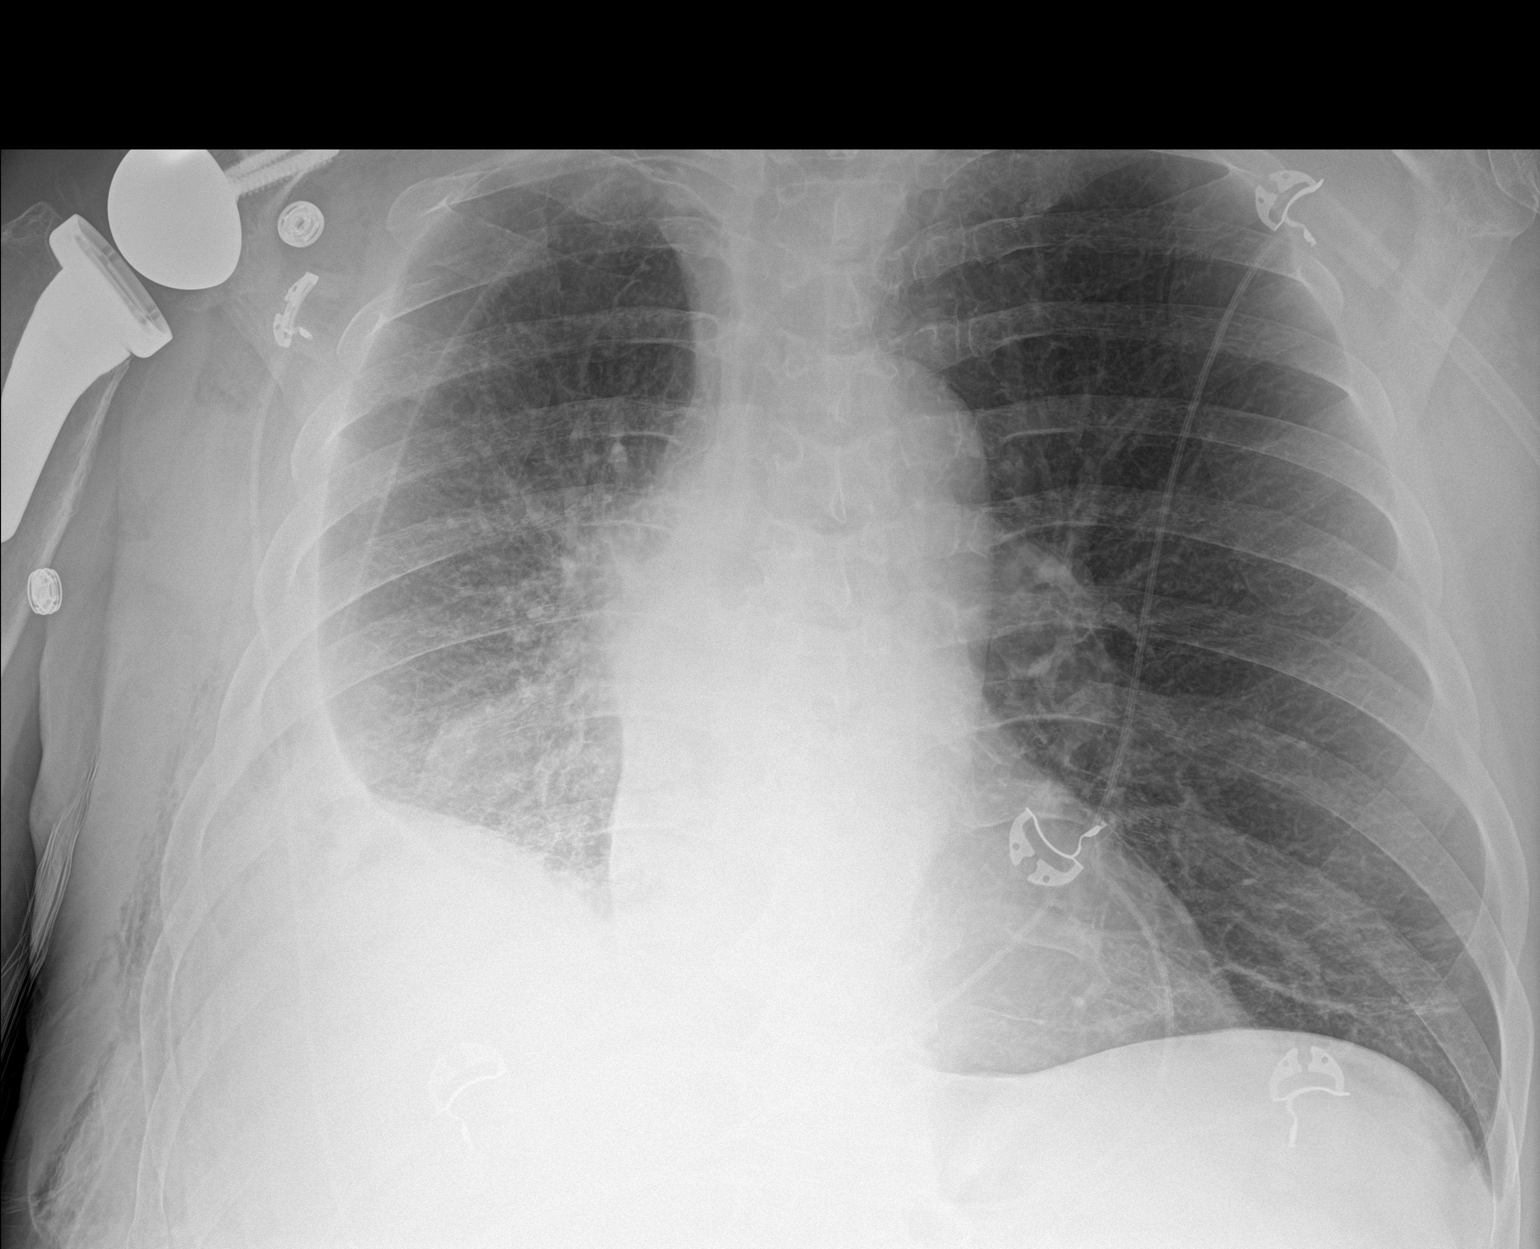

[1 of 1 positions shown; findings below may reference images not displayed]

FINDINGS: Similar appearance of a right hydropneumothorax with similar small
air component. Similar subcutaneous emphysema along the right chest
wall. Similar mild bibasilar atelectasis. Similar cardiomediastinal
silhouette. Partially imaged right reverse shoulder arthroplasty.
IMPRESSION: 1. Similar right hydropneumothorax with small air component.
2. Similar bibasilar atelectasis.

## 2021-05-31 IMAGING — DX DG CHEST 1V PORT
2 series · 2 of 2 positions shown · non-contrast
Comparison: [DATE]

CLINICAL DATA: Status post chest tube removal with respiratory
distress

EXAM:
PORTABLE CHEST 1 VIEW

[chest ap (1 of 2)]
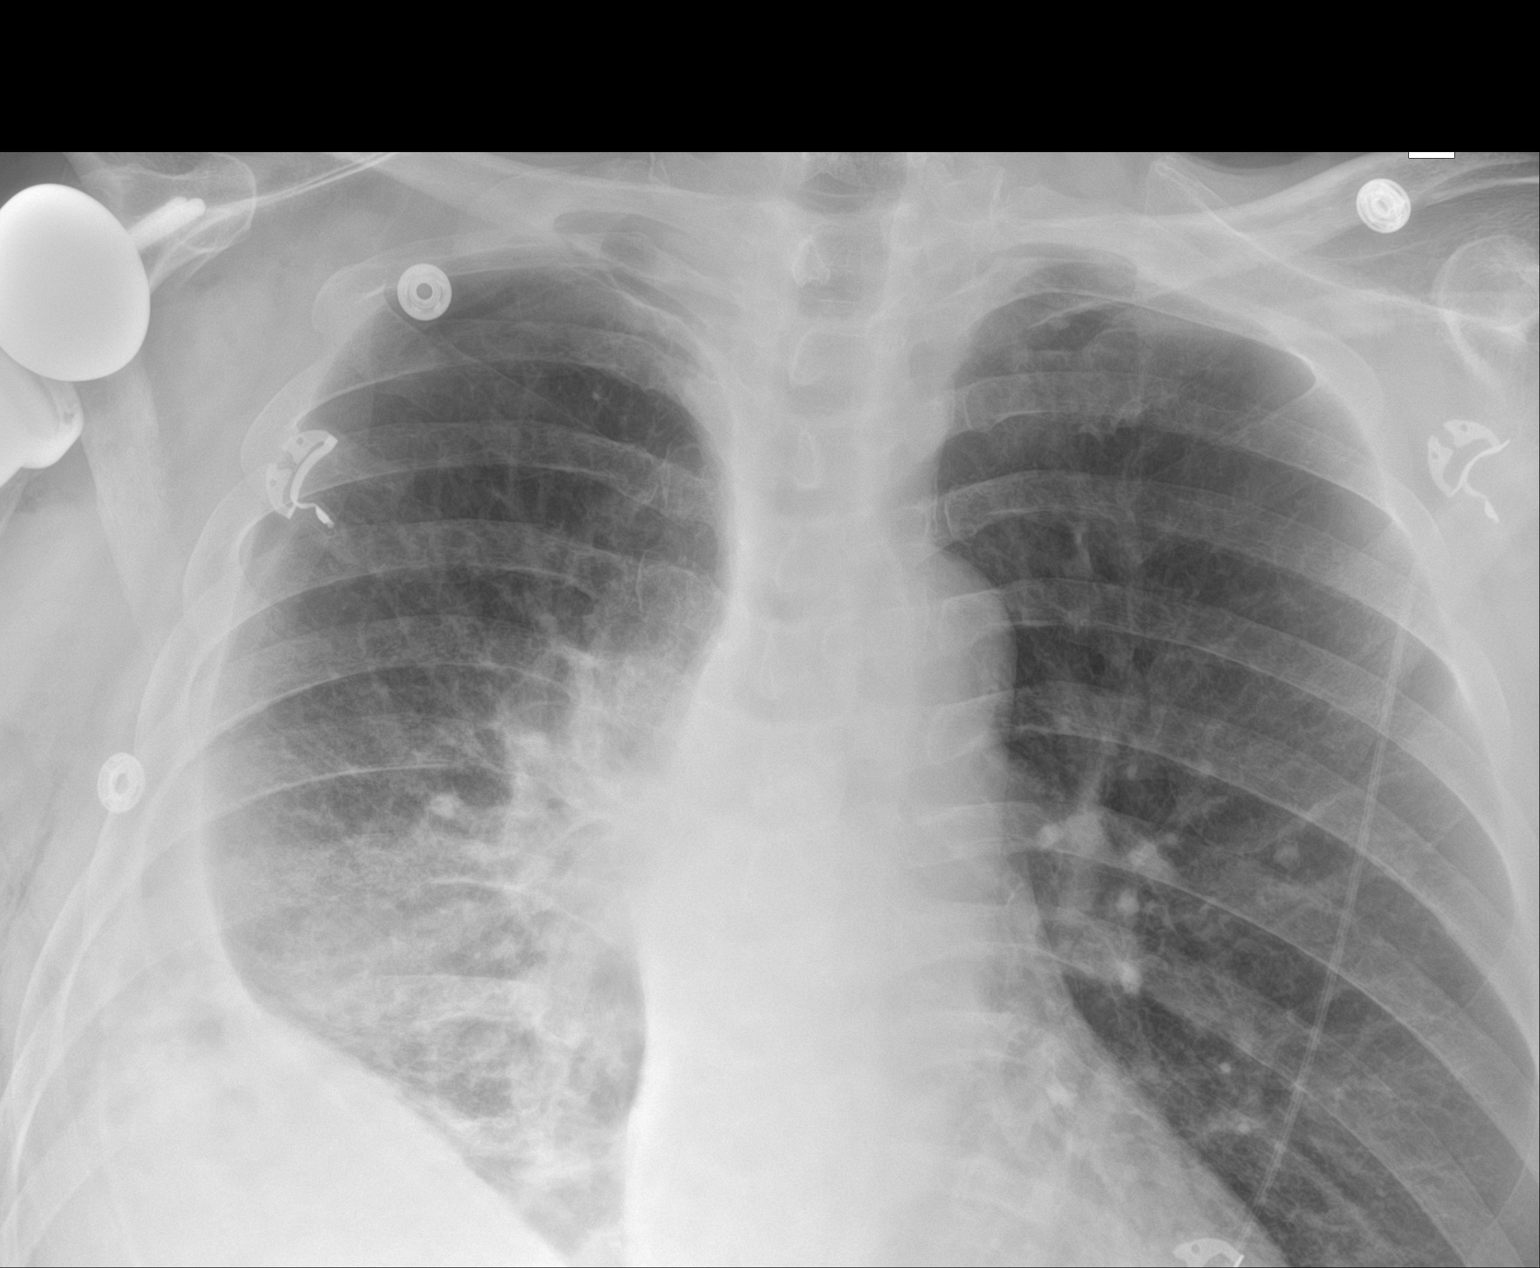

[chest ap (2 of 2)]
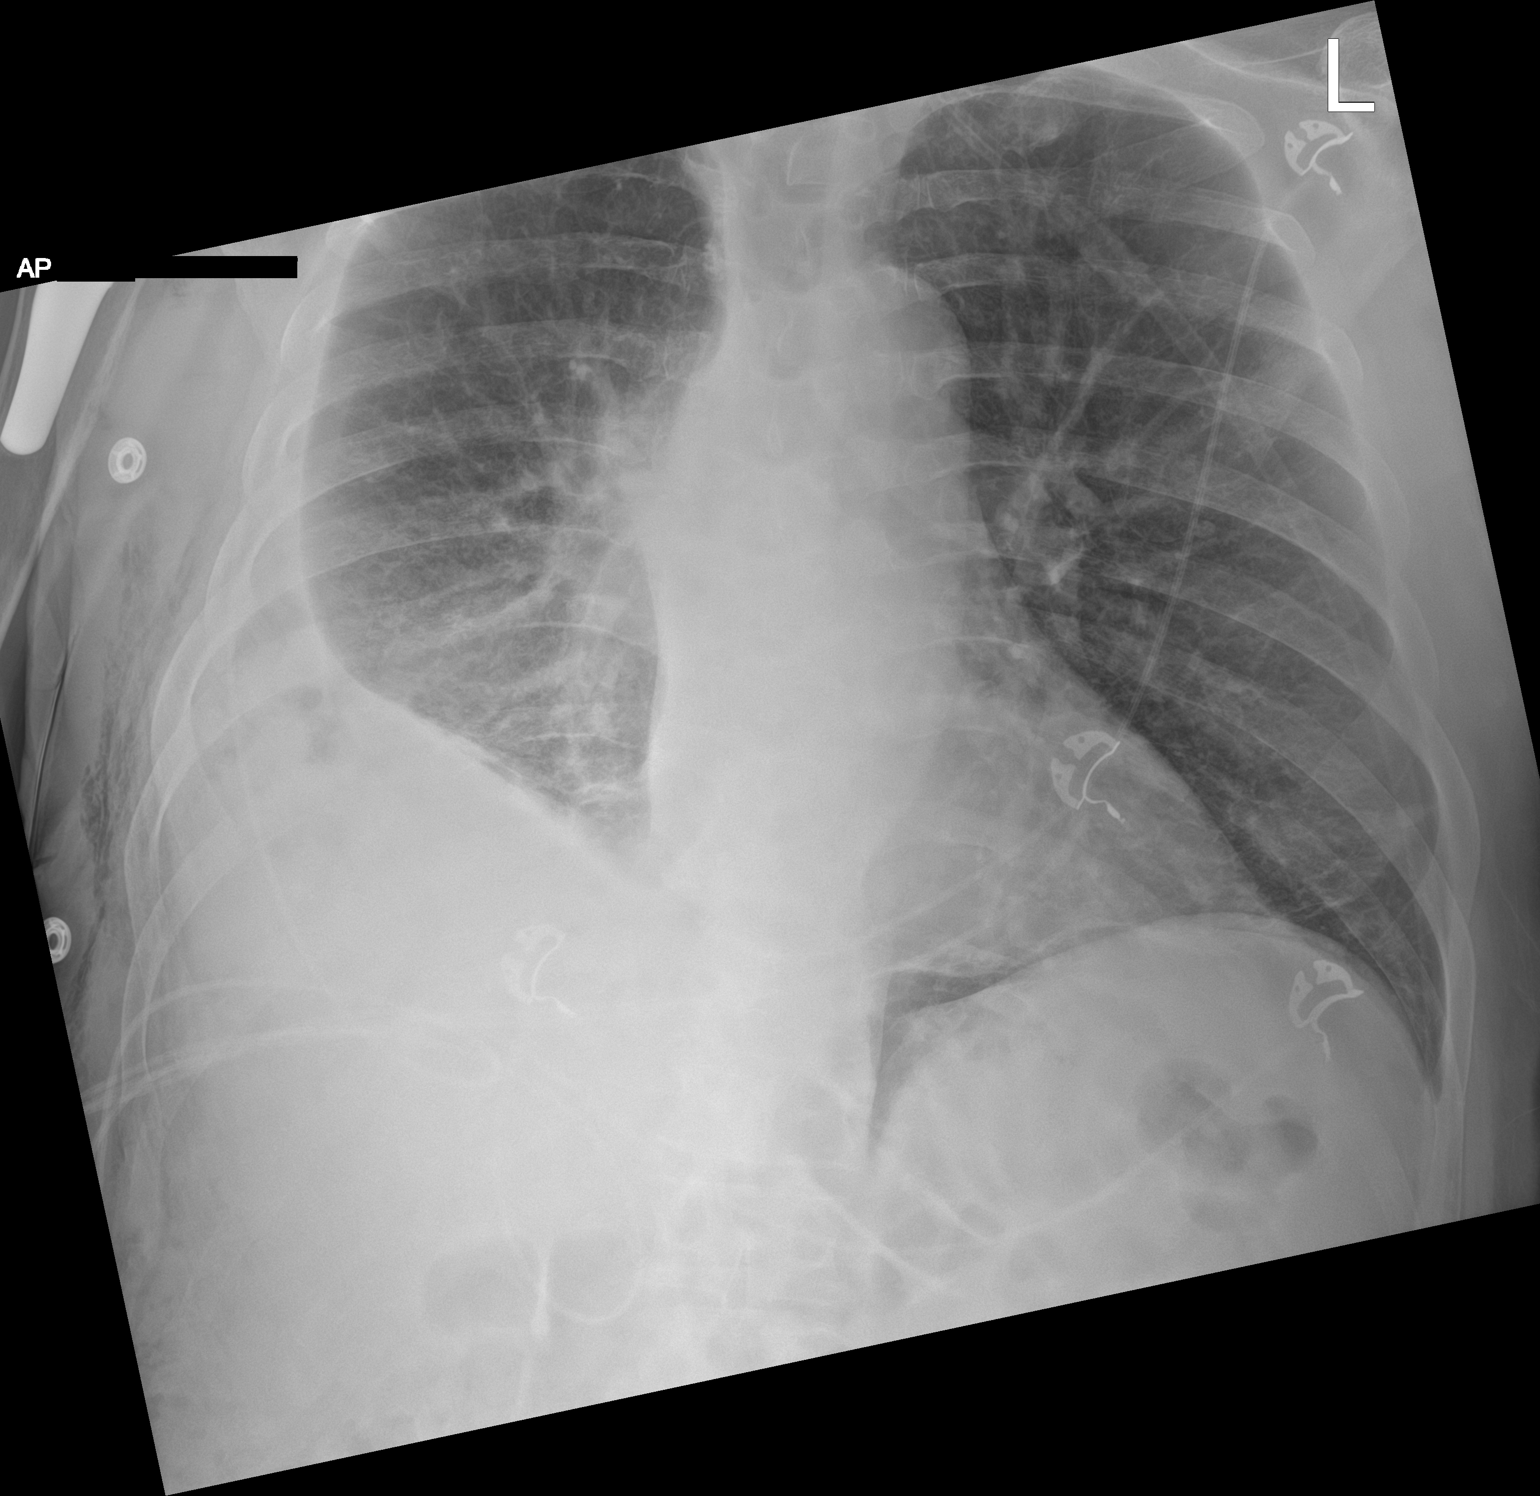

[2 of 2 positions shown; findings below may reference images not displayed]

FINDINGS: Cardiac shadow is within normal limits. Persistent consolidation and
effusion is noted in the right lung base stable from the prior exam.
Remainder of the right lung is clear. Left lung is clear as well. No
bony abnormality is noted. No discrete pneumothorax is noted.
Previously described pneumothorax may have been related to chest
tube tract on the right
IMPRESSION: Persistent consolidation and effusion in the right base stable from
the previous exam. Linear density is noted along the tract of the
previous chest tube in the right apex. No definitive pneumothorax is
seen.

## 2021-05-31 IMAGING — DX DG CHEST 1V SAME DAY
1 series · 1 of 1 positions shown · non-contrast
Comparison: [DATE]

CLINICAL DATA: Chest tube removal

EXAM:
CHEST - 1 VIEW SAME DAY

[chest ap]
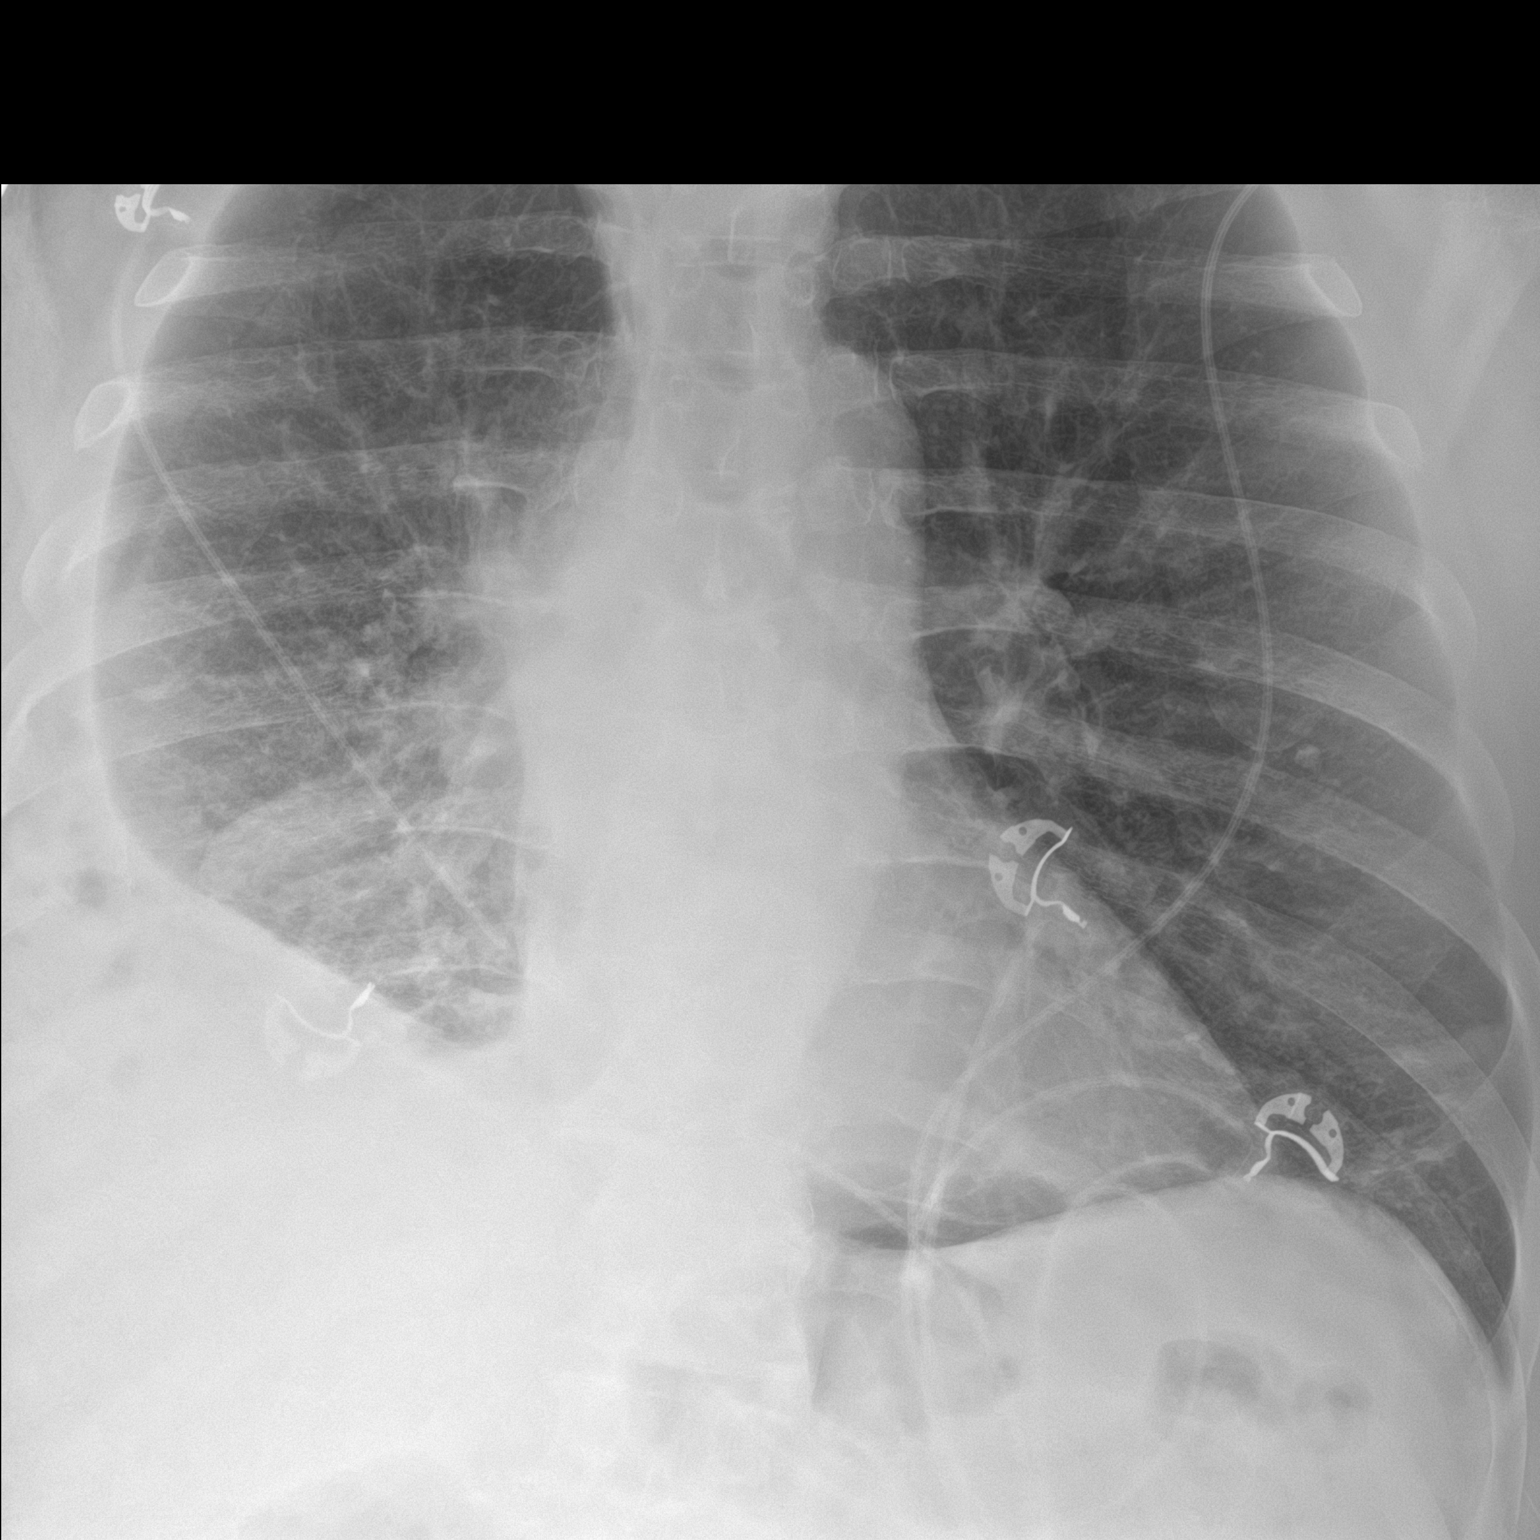

[1 of 1 positions shown; findings below may reference images not displayed]

FINDINGS: Stable appearance of right hydropneumothorax with small air
component. Right basilar atelectasis. Stable cardiomediastinal
contours.
IMPRESSION: Stable appearance of right hydropneumothorax with small air
component.

## 2021-05-31 MED ORDER — SODIUM CHLORIDE 0.45 % IV SOLN: INTRAVENOUS | Status: DC

## 2021-05-31 MED ORDER — SODIUM CHLORIDE 0.9 % IV SOLN
2.0000 g | INTRAVENOUS | Status: DC
  Administered 2021-05-31 – 2021-06-02 (×3): 2 g via INTRAVENOUS
  Filled 2021-05-31 (×3): qty 20

## 2021-05-31 MED ORDER — NITROGLYCERIN 0.4 MG SL SUBL
SUBLINGUAL_TABLET | SUBLINGUAL | Status: AC
  Filled 2021-05-31: qty 1

## 2021-05-31 NOTE — Progress Notes (Signed)
D/c chest tube per MD order. Scant amount of purulent discharge found at chest tube site , red skin around chest tube site. Pt tolerated well. NP notified.   Lavenia Atlas, RN

## 2021-05-31 NOTE — Progress Notes (Signed)
Rapid Response Note   Reason for Call :  Fast, irregular HR. HR up to 175, non-sustained.  Initial Focused Assessment:  Pt lying in bed, AO. Heart rate is regular, tachycardic at 108 bpm upon my arrival. He denies feeling his heart racing. He denies pain and lightheadedness. Skin color is pink, temperature is warm. Lung sounds are clear, diminished. Abdomen is soft.   Review of telemetry reveals sinus tachycardia with intermittent PVCs and 4-5 beat run of PVCs.   Interventions:  -EKG  Plan of Care:  -Continuous telemetry -Notify provider if tachycardia continues  Call rapid response for additional needs.  Event Summary:  MD Notified: Notified by RN Call Time: [place] Time: 1100 End Time: [place], RN

## 2021-05-31 NOTE — Progress Notes (Signed)
Pt's HR=150s-170s ( not sustained). Called Rapid response nurse. Pt asymptomatic. EKG showed ST. PA aware. Will continue to monitor the pt.   Lavenia Atlas, RN

## 2021-05-31 NOTE — Progress Notes (Addendum)
° °   °  [address]       [place] [address]             [phone]      11 Days Post-Op Procedure(s) (LRB): XI ROBOTIC ASSISTED THORASCOPY-RIGHT LOWER LOBECTOMY (Right) LYMPH NODE DISSECTION (Right) INTERCOSTAL NERVE BLOCK (Right)  Subjective:  Patient states not doing great.  Wants his chest out, states he now has pneumonia, and doesn't have much of an appetite  Objective: Vital signs in last 24 hours: Temp:  [97.7 F (36.5 C)-98.5 F (36.9 C)] 98.1 F (36.7 C) (12/20 0433) Pulse Rate:  [90-105] 99 (12/20 0433) Cardiac Rhythm: Sinus tachycardia (12/19 1920) Resp:  [12-22] 19 (12/20 0433) BP: (55-106)/(0-70) 91/68 (12/20 0433) SpO2:  [91 %-100 %] 97 % (12/20 0433)  Intake/Output from previous day: 12/19 0701 - 12/20 0700 In: 3250.4 [P.O.:960; I.V.:927.5; IV Piggyback:1363] Out: 7829 [Urine:1450; Chest Tube:200]  General appearance: alert and no distress Heart: regular rate and rhythm Lungs: diminished breath sounds right base Abdomen: soft, non-tender; bowel sounds normal; no masses,  no organomegaly Extremities: extremities normal, atraumatic, no cyanosis or edema Wound: clean and dry,   Lab Results: Recent Labs    05/30/21 1552  WBC 24.2*  HGB 12.8*  HCT 38.9*  PLT 477*   BMET:  Recent Labs    05/30/21 1552  NA 124*  K 5.1  CL 93*  CO2 22  GLUCOSE 144*  BUN 31*  CREATININE 1.57*  CALCIUM 8.2*    PT/INR: No results for input(s): LABPROT, INR in the last 72 hours. ABG    Component Value Date/Time   PHART 7.405 05/18/2021 1450   HCO3 23.9 05/18/2021 1450   ACIDBASEDEF 0.2 05/18/2021 1450   O2SAT 96.8 05/18/2021 1450   CBG (last 3)  Recent Labs    05/30/21 1529 05/30/21 2130 05/31/21 0618  GLUCAP 139* 139* 138*    Assessment/Plan: S/P Procedure(s) (LRB): XI ROBOTIC ASSISTED THORASCOPY-RIGHT LOWER LOBECTOMY (Right) LYMPH NODE DISSECTION (Right) INTERCOSTAL NERVE BLOCK (Right)  CV- Sinus Tach, BP is labile running  80-90s SBP- home Lisinopril on hold, IV fluids running at 100 cc per hour Pulm- no air leak from chest tube, CXR with basilar effusion, trace apical pneumothorax stable, evidence of pneumonia on right.. CT output nothing in the new pleurovac Renal- creatinine yesterday was up to 1.57, he has been receiving IV hydration, will get repeat level this morning ID- remains afebrile, leukocytosis yesterday, sputum culture is pending, however GS shows GPC.Marland Kitchen on Vanc and Zosyn per pharmacy... can adjust regimen as appropriate and culture results indicate DM- cbgs controlled, patient with poor appetite currently, monitor closely Dispo- patient with new onset HAP on Vanc, Zosyn, remains hypotensive despite IV fluids, repeat labs are pending, CXR essentially unchanged from yesterday, accept for increase atelectasis,pneumonia on right... continue IV fluids/ABX for now.... CT is w/o air leak, will discuss management with Dr. Roxan Hockey   LOS: 11 days    Ellwood Handler, PA-C 05/31/2021 Patient seen and examined, agree with above No air leak and minimal drainage- will dc chest tube, does have some fluid filling basilar space on CXR Started on Vanco and Zosyn empirically for presumed pneumonia- continue with IV at least another 24 hours, then hopefully can change to PO antibiotic Change IVF to 1/2 NS  Conlin Brahm C. Roxan Hockey, MD [facility] [phone]

## 2021-05-31 NOTE — Progress Notes (Signed)
Received CCMD notified pt's HR SVT. Pt asymptomatic. NP notified.   Lavenia Atlas, RN

## 2021-05-31 NOTE — Progress Notes (Signed)
Rapid Response Event Note   Reason for Call :   Sudden onset 10/10 CP   Initial Focused Assessment:  Pt A&O x 4  sitting on side of bed skin cool and dry. HR 114 ST, RR 23, BP 134/88,  SpO2 94% RA.  Lungs sound crackles R and clear diminished on L, chest tube removal site clean, dry crepitus noted above site on back below shoulder above site. RT to bedside to give scheduled breathing tx pain 8/10 after.    Interventions:   CXR EKG  [place] 4L  PRN pain med   Plan of Care:   Pain improving with o2 therapy and pain meds now pain 6/10. CXR to compare to earlier continue pain management and call RRT RN if further assistance needed.     Devota Pace, RN

## 2021-06-01 ENCOUNTER — Inpatient Hospital Stay (HOSPITAL_COMMUNITY): Payer: Medicare Other

## 2021-06-01 LAB — CBC
HCT: 32 % — ABNORMAL LOW (ref 39.0–52.0)
Hemoglobin: 10.6 g/dL — ABNORMAL LOW (ref 13.0–17.0)
MCH: 28.4 pg (ref 26.0–34.0)
MCHC: 33.1 g/dL (ref 30.0–36.0)
MCV: 85.8 fL (ref 80.0–100.0)
Platelets: 409 10*3/uL — ABNORMAL HIGH (ref 150–400)
RBC: 3.73 MIL/uL — ABNORMAL LOW (ref 4.22–5.81)
RDW: 13 % (ref 11.5–15.5)
WBC: 16.8 10*3/uL — ABNORMAL HIGH (ref 4.0–10.5)
nRBC: 0 % (ref 0.0–0.2)

## 2021-06-01 LAB — CULTURE, RESPIRATORY W GRAM STAIN

## 2021-06-01 LAB — BASIC METABOLIC PANEL
Anion gap: 8 (ref 5–15)
BUN: 8 mg/dL (ref 8–23)
CO2: 25 mmol/L (ref 22–32)
Calcium: 7.9 mg/dL — ABNORMAL LOW (ref 8.9–10.3)
Chloride: 97 mmol/L — ABNORMAL LOW (ref 98–111)
Creatinine, Ser: 0.6 mg/dL — ABNORMAL LOW (ref 0.61–1.24)
GFR, Estimated: >60 mL/min (ref >60)
Glucose, Bld: 128 mg/dL — ABNORMAL HIGH (ref 70–99)
Potassium: 4.1 mmol/L (ref 3.5–5.1)
Sodium: 130 mmol/L — ABNORMAL LOW (ref 135–145)

## 2021-06-01 LAB — GLUCOSE, CAPILLARY
Glucose-Capillary: 163 mg/dL — ABNORMAL HIGH (ref 70–99)
Glucose-Capillary: 138 mg/dL — ABNORMAL HIGH (ref 70–99)
Glucose-Capillary: 130 mg/dL — ABNORMAL HIGH (ref 70–99)
Glucose-Capillary: 128 mg/dL — ABNORMAL HIGH (ref 70–99)

## 2021-06-01 IMAGING — CR DG CHEST 2V
2 series · 2 of 2 positions shown · non-contrast
Comparison: [DATE]

CLINICAL DATA: Right lower lobectomy

EXAM:
CHEST - 2 VIEW

[chest ap]
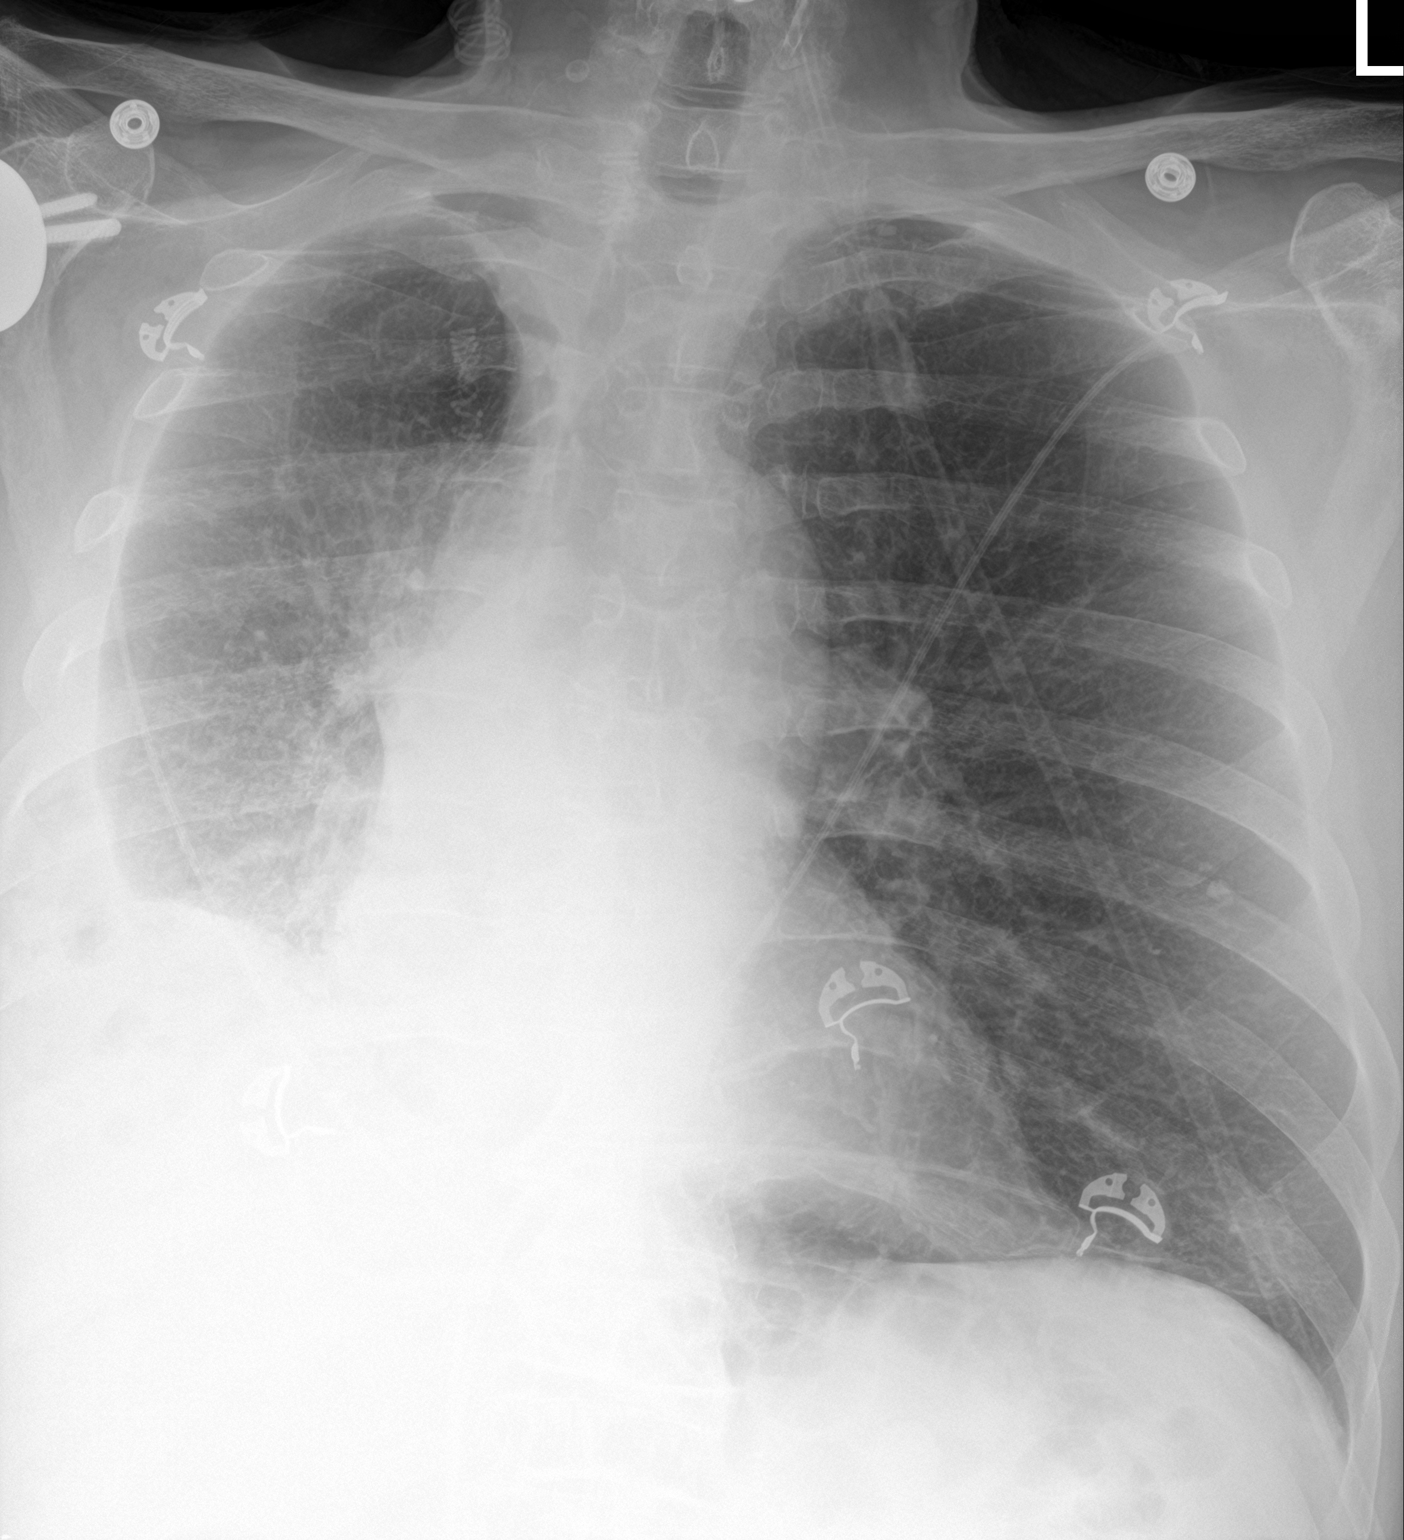

[chest lat]
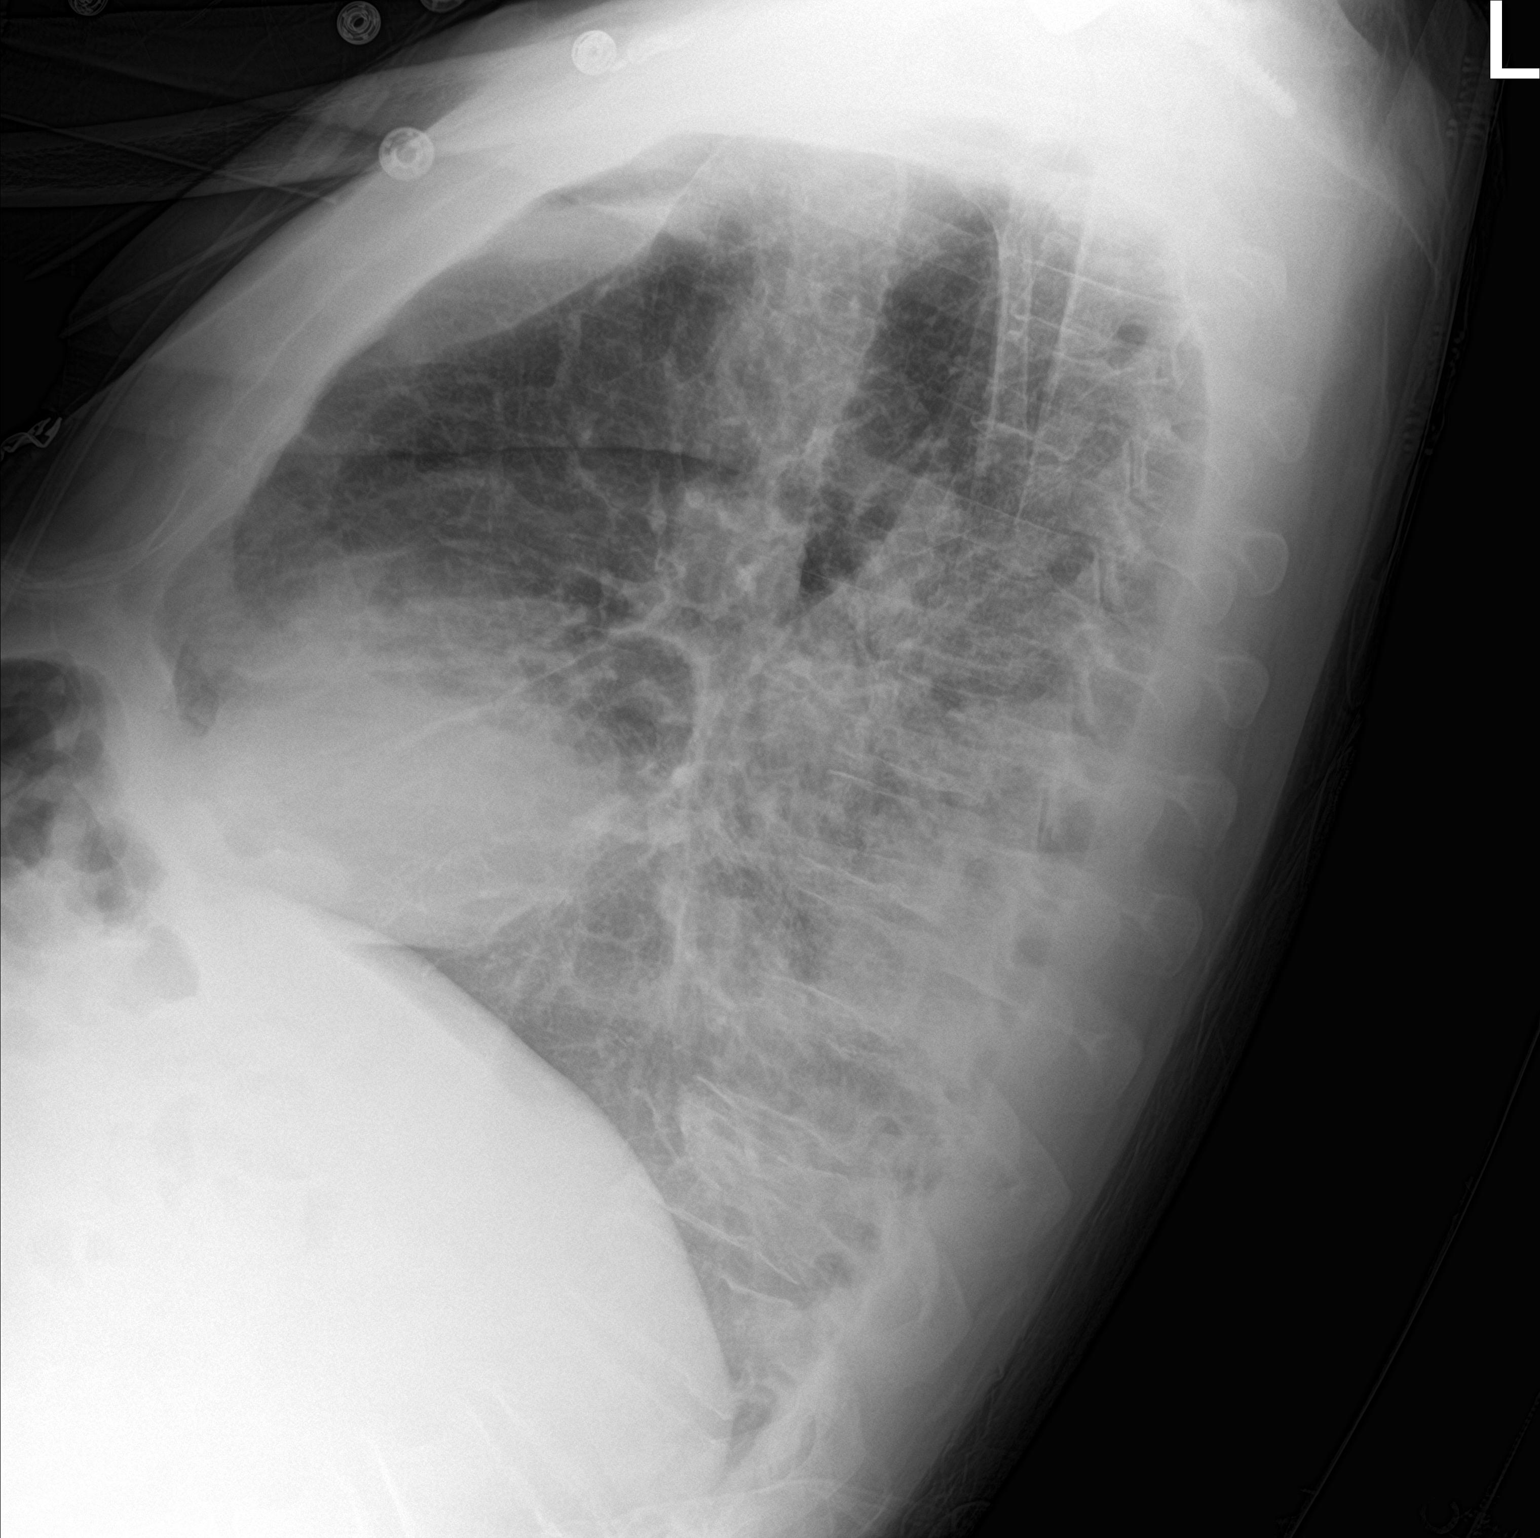

[2 of 2 positions shown; findings below may reference images not displayed]

FINDINGS: Airspace disease throughout the right lung, most confluent in the
right lower lung. Small right pleural effusion. Small right apical
pneumothorax, stable. No confluent opacity on the left. Heart is
normal size.
IMPRESSION: Postoperative changes volume loss on the right. Airspace disease
throughout the right lung with stable small right pleural effusion
and right apical pneumothorax.

## 2021-06-01 MED ORDER — ENSURE ENLIVE PO LIQD: 237.0000 mL | Freq: Two times a day (BID) | ORAL | Status: DC

## 2021-06-01 MED ORDER — GLUCERNA SHAKE PO LIQD
237.0000 mL | Freq: Three times a day (TID) | ORAL | Status: DC
  Administered 2021-06-01 – 2021-06-02 (×3): 237 mL via ORAL

## 2021-06-01 MED ORDER — METOPROLOL TARTRATE 5 MG/5ML IV SOLN: 2.5000 mg | Freq: Four times a day (QID) | INTRAVENOUS | Status: DC | PRN

## 2021-06-01 NOTE — Care Management Important Message (Signed)
Important Message  Patient Details  Name: Stephen Diaz MRN: [id number] Date of Birth: 03/08/1956   Medicare Important Message Given:  Yes     Dondi, Aime 06/01/2021, 9:20 AM

## 2021-06-01 NOTE — Progress Notes (Addendum)
° °   °  [address]       [employer] [address]             [phone]      12 Days Post-Op Procedure(s) (LRB): XI ROBOTIC ASSISTED THORASCOPY-RIGHT LOWER LOBECTOMY (Right) LYMPH NODE DISSECTION (Right) INTERCOSTAL NERVE BLOCK (Right)  Subjective:  Patient continues to not have much of an appetite.  He has had some elevated HR at times.  Objective: Vital signs in last 24 hours: Temp:  [97.8 F (36.6 C)-98.2 F (36.8 C)] 98.2 F (36.8 C) (12/21 0329) Pulse Rate:  [91-108] 96 (12/21 0329) Cardiac Rhythm: Normal sinus rhythm (12/20 2040) Resp:  [16-24] 16 (12/21 0329) BP: (99-113)/(64-74) 106/74 (12/21 0329) SpO2:  [94 %-100 %] 99 % (12/21 0329)  Intake/Output from previous day: 12/20 0701 - 12/21 0700 In: 1351.3 [P.O.:540; I.V.:711.3; IV Piggyback:100] Out: 5600 [Urine:5600]  General appearance: alert, cooperative, and no distress Heart: regular rate and rhythm Lungs: clear to auscultation bilaterally Abdomen: soft, non-tender; bowel sounds normal; no masses,  no organomegaly Extremities: extremities normal, atraumatic, no cyanosis or edema Wound: clean and dry  Lab Results: Recent Labs    05/30/21 1552 06/01/21 0252  WBC 24.2* 16.8*  HGB 12.8* 10.6*  HCT 38.9* 32.0*  PLT 477* 409*   BMET:  Recent Labs    05/31/21 0748 06/01/21 0252  NA 126* 130*  K 4.6 4.1  CL 95* 97*  CO2 22 25  GLUCOSE 184* 128*  BUN 15 8  CREATININE 0.80 0.60*  CALCIUM 8.1* 7.9*    PT/INR: No results for input(s): LABPROT, INR in the last 72 hours. ABG    Component Value Date/Time   PHART 7.405 05/18/2021 1450   HCO3 23.9 05/18/2021 1450   ACIDBASEDEF 0.2 05/18/2021 1450   O2SAT 96.8 05/18/2021 1450   CBG (last 3)  Recent Labs    05/31/21 1619 05/31/21 2130 06/01/21 0638  GLUCAP 126* 161* 163*    Assessment/Plan: S/P Procedure(s) (LRB): XI ROBOTIC ASSISTED THORASCOPY-RIGHT LOWER LOBECTOMY (Right) LYMPH NODE DISSECTION (Right) INTERCOSTAL NERVE  BLOCK (Right)  CV- Sinus Tachycardia, SVT at times- Hypotension is slowly improving, continue to hold Lisinopril for now, can use prn IV Lopressor for HR as BP allows Pulm- no pneumothorax post chest tube removal- continued atelectasis, consolidation and effusion right base, essentially unchanged ID- remains afebrile, leukocytosis improving down to 16K, on Ceftriaxone for Enterobacter species, final sensitivities remain pending DM- sugars mostly controlled, patient with decreased appetite, will add glucerna supplement Dispo- patient stable, continue current care, will adjust ABX per sensitivities, supplement oral intake, prn IV lopressor as BP allows for SVT   LOS: 12 days    Ellwood Handler, PA-C 06/01/2021  Patient seen and examined, agree with assessment and plan as noted above Consider changing to Po antibiotics tomorrow if continues to improve   Remo Lipps C. Roxan Hockey, MD [facility] [phone]

## 2021-06-02 LAB — GLUCOSE, CAPILLARY
Glucose-Capillary: 121 mg/dL — ABNORMAL HIGH (ref 70–99)
Glucose-Capillary: 143 mg/dL — ABNORMAL HIGH (ref 70–99)

## 2021-06-02 MED ORDER — TAMSULOSIN HCL 0.4 MG PO CAPS: 0.4000 mg | ORAL_CAPSULE | Freq: Every day | ORAL | 3 refills | Status: DC

## 2021-06-02 MED ORDER — GABAPENTIN 100 MG PO CAPS: 100.0000 mg | ORAL_CAPSULE | Freq: Two times a day (BID) | ORAL | 3 refills | Status: DC

## 2021-06-02 MED ORDER — IBUPROFEN 200 MG PO TABS
400.0000 mg | ORAL_TABLET | Freq: Four times a day (QID) | ORAL | Status: DC | PRN
Start: 2021-06-02 — End: 2021-07-19

## 2021-06-02 MED ORDER — CIPROFLOXACIN HCL 500 MG PO TABS: 500.0000 mg | ORAL_TABLET | Freq: Two times a day (BID) | ORAL | 0 refills | Status: AC

## 2021-06-02 MED ORDER — OXYCODONE HCL 5 MG PO TABS: 5.0000 mg | ORAL_TABLET | ORAL | 0 refills | Status: DC | PRN

## 2021-06-02 NOTE — Progress Notes (Addendum)
° °   °  [address]       [employer] [address]             [phone]      13 Days Post-Op Procedure(s) (LRB): XI ROBOTIC ASSISTED THORASCOPY-RIGHT LOWER LOBECTOMY (Right) LYMPH NODE DISSECTION (Right) INTERCOSTAL NERVE BLOCK (Right)  Subjective:  Patient feeling better, wants to go home.   Objective: Vital signs in last 24 hours: Temp:  [97.8 F (36.6 C)-99 F (37.2 C)] 98.2 F (36.8 C) (12/22 0346) Pulse Rate:  [97-100] 99 (12/22 0346) Cardiac Rhythm: Normal sinus rhythm (12/22 0337) Resp:  [15-20] 20 (12/22 0346) BP: (108-137)/(66-97) 121/80 (12/22 0346) SpO2:  [91 %-99 %] 95 % (12/22 0346)  Intake/Output from previous day: 12/21 0701 - 12/22 0700 In: 4332.7 [P.O.:1077; I.V.:3155.7; IV Piggyback:100] Out: 2900 [Urine:2900]  General appearance: alert, cooperative, and no distress Heart: regular rate and rhythm Lungs: diminished breath sounds right base Abdomen: soft, non-tender; bowel sounds normal; no masses,  no organomegaly Extremities: extremities normal, atraumatic, no cyanosis or edema Wound: clean and dry  Lab Results: Recent Labs    05/30/21 1552 06/01/21 0252  WBC 24.2* 16.8*  HGB 12.8* 10.6*  HCT 38.9* 32.0*  PLT 477* 409*   BMET:  Recent Labs    05/31/21 0748 06/01/21 0252  NA 126* 130*  K 4.6 4.1  CL 95* 97*  CO2 22 25  GLUCOSE 184* 128*  BUN 15 8  CREATININE 0.80 0.60*  CALCIUM 8.1* 7.9*    PT/INR: No results for input(s): LABPROT, INR in the last 72 hours. ABG    Component Value Date/Time   PHART 7.405 05/18/2021 1450   HCO3 23.9 05/18/2021 1450   ACIDBASEDEF 0.2 05/18/2021 1450   O2SAT 96.8 05/18/2021 1450   CBG (last 3)  Recent Labs    06/01/21 1641 06/01/21 2125 06/02/21 0615  GLUCAP 130* 128* 121*    Assessment/Plan: S/P Procedure(s) (LRB): XI ROBOTIC ASSISTED THORASCOPY-RIGHT LOWER LOBECTOMY (Right) LYMPH NODE DISSECTION (Right) INTERCOSTAL NERVE BLOCK (Right)  CV-NSR, BP improved, back into  the 130s- will hold Lisinopril for now, stop IV fluids Pulm- no acute issues, continue IS ID- remains afebrile, on Ceftriaxone for Enterbacter.. sensitivities are back will discuss transition to oral medication with Dr. Roxan Hockey...  DM- sugars stable Dispo- patient stable, will transition to oral ABX and plan to d/c home today   LOS: 13 days   Ellwood Handler, PA-C 06/02/2021  Patient seen and examined, agree with above Home today on PO antibiotics  Remo Lipps C. Roxan Hockey, MD [facility] [phone]

## 2021-06-02 NOTE — Progress Notes (Signed)
No changes from previous assessment.

## 2021-06-14 ENCOUNTER — Ambulatory Visit
Admission: RE | Admit: 2021-06-14 | Discharge: 2021-06-14 | Disposition: A | Discharge status: Home / Self Care | Payer: Medicare Other | Source: Ambulatory Visit | Attending: Thoracic Surgery (Cardiothoracic Vascular Surgery) | Admitting: Thoracic Surgery (Cardiothoracic Vascular Surgery)

## 2021-06-14 ENCOUNTER — Telehealth: Payer: Self-pay | Admitting: Internal Medicine

## 2021-06-14 ENCOUNTER — Other Ambulatory Visit: Payer: Self-pay

## 2021-06-14 ENCOUNTER — Encounter: Payer: Self-pay | Admitting: *Deleted

## 2021-06-14 ENCOUNTER — Ambulatory Visit (INDEPENDENT_AMBULATORY_CARE_PROVIDER_SITE_OTHER): Payer: Self-pay | Admitting: Thoracic Surgery (Cardiothoracic Vascular Surgery)

## 2021-06-14 VITALS — BP 107/65 | HR 100 | Resp 20 | Ht 73.0 in | Wt 185.0 lb

## 2021-06-14 DIAGNOSIS — R911 Solitary pulmonary nodule: Secondary | ICD-10-CM

## 2021-06-14 DIAGNOSIS — Z09 Encounter for follow-up examination after completed treatment for conditions other than malignant neoplasm: Secondary | ICD-10-CM

## 2021-06-14 DIAGNOSIS — Z902 Acquired absence of lung [part of]: Secondary | ICD-10-CM

## 2021-06-14 IMAGING — CR DG CHEST 2V
2 series · 2 of 2 positions shown · non-contrast
Comparison: Chest radiograph [DATE]

CLINICAL DATA: Shortness of breath, history of lymph node
dissection [DATE]

EXAM:
CHEST - 2 VIEW

[w chest pa]
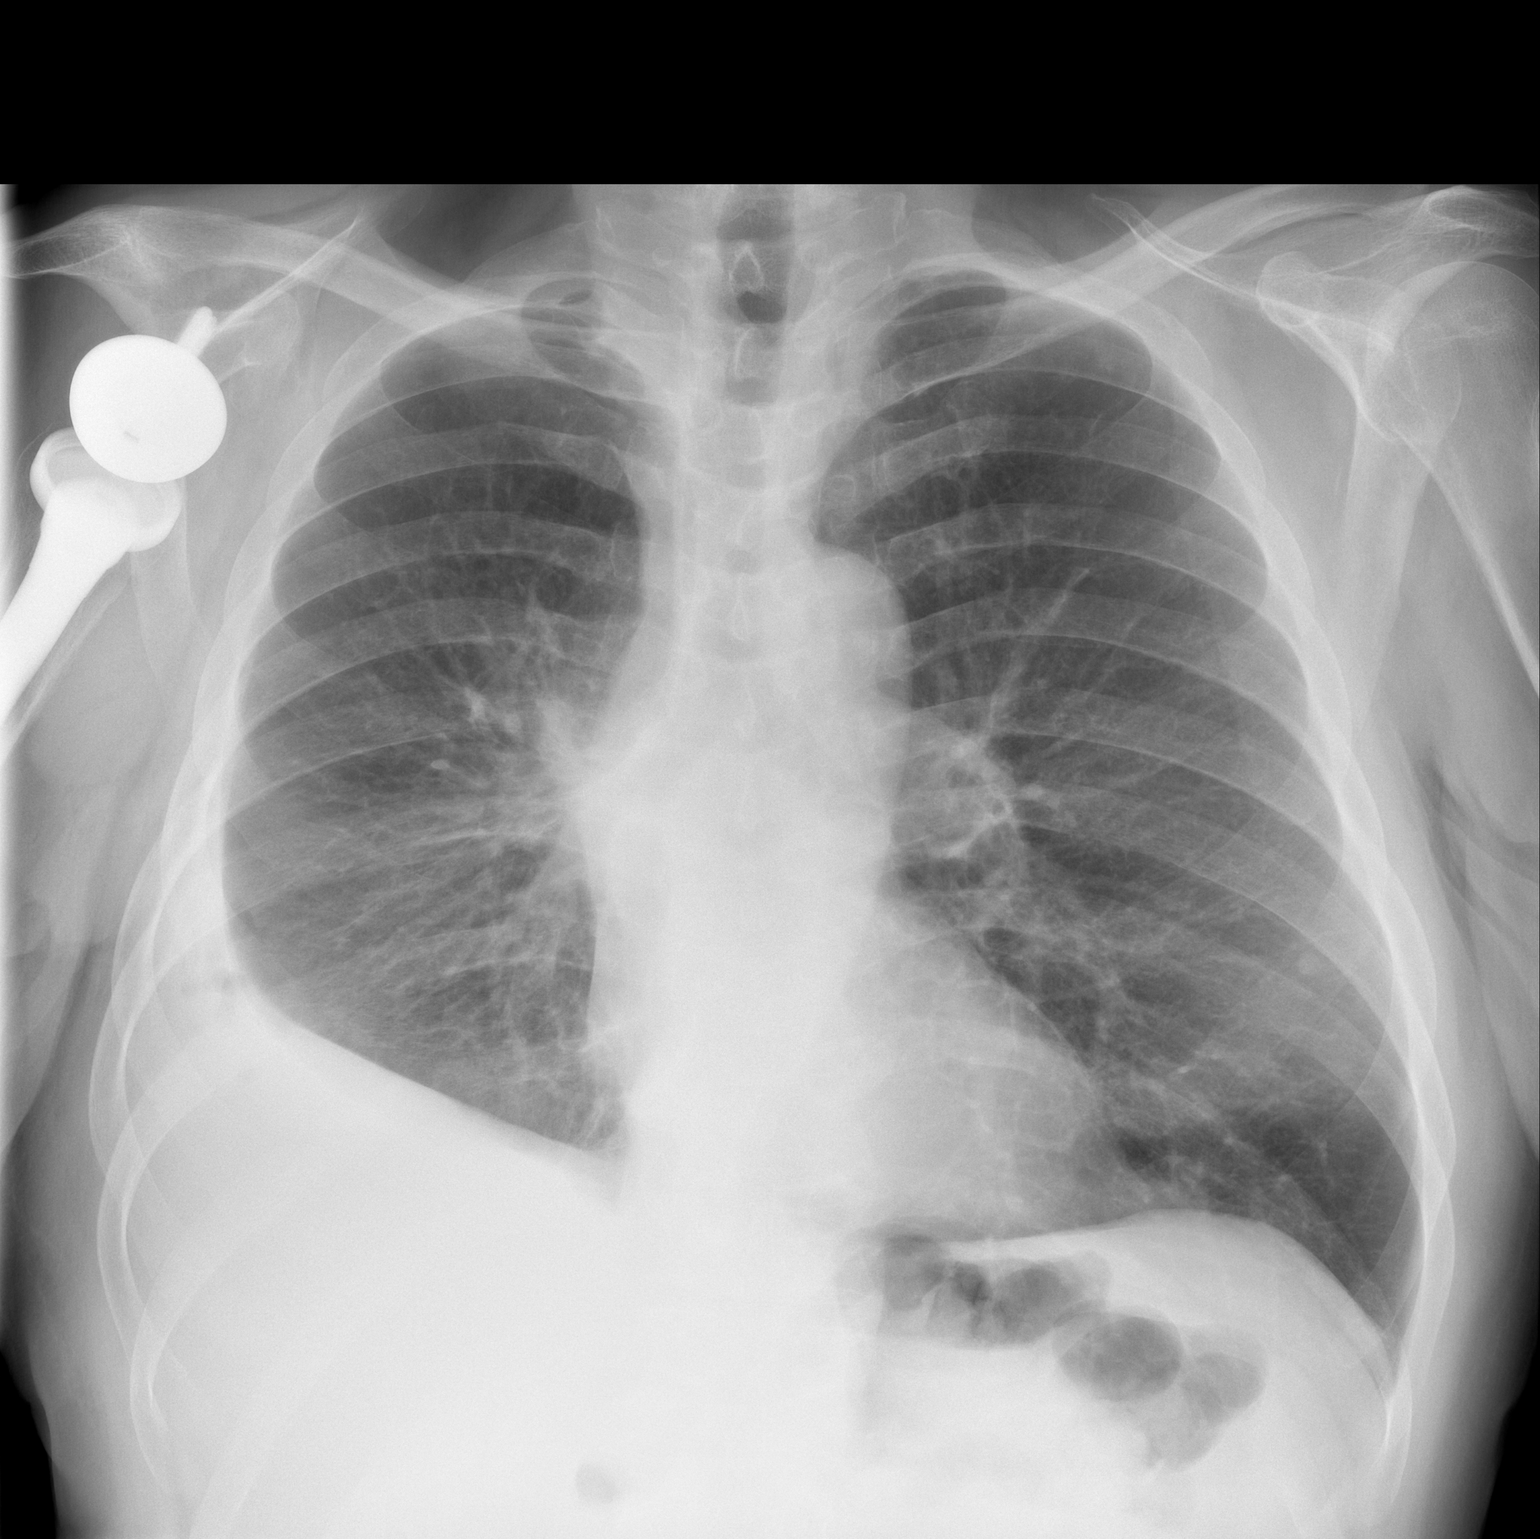

[w chest lat]
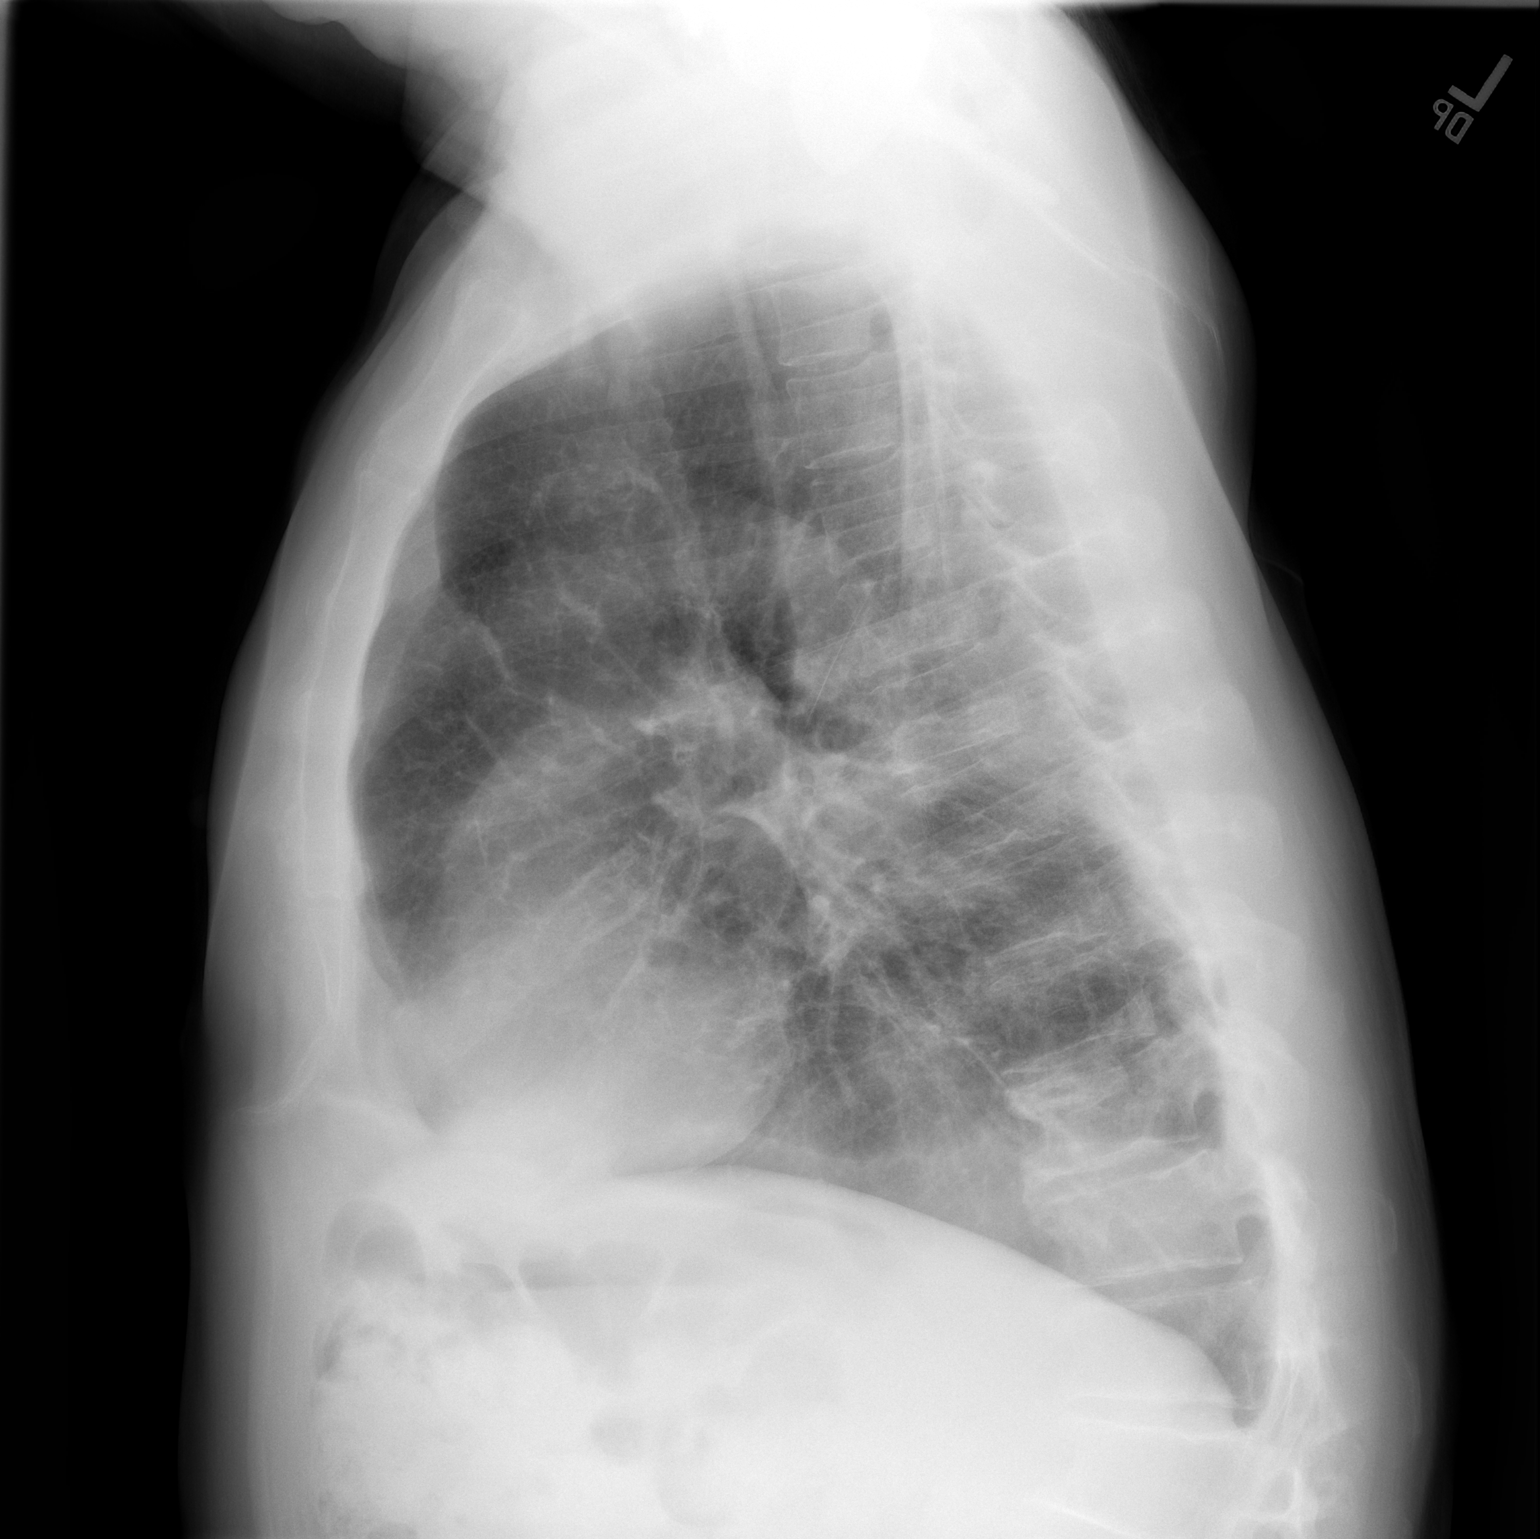

[2 of 2 positions shown; findings below may reference images not displayed]

FINDINGS: The cardiomediastinal silhouette is within normal limits.

There remains a small to medium size right pleural effusion,
decreased in size since [DATE]. Aeration in the right lung has
significantly improved compared to that study. The left lung is
clear. There is no significant left effusion. There is no
pneumothorax.

The bones are stable. Right shoulder arthroplasty hardware is again
noted.
IMPRESSION: Persistent small to medium size right pleural effusion, decreased in
size since [DATE], with significantly improved aeration in the
right lung.

## 2021-06-14 MED ORDER — PREDNISONE 10 MG (21) PO TBPK: ORAL_TABLET | ORAL | 0 refills | Status: AC

## 2021-06-14 NOTE — Telephone Encounter (Signed)
Scheduled appt per 1/3 referral. Pt is aware of appt date and time. Pt is aware to arrive 15 mins prior to appt time.

## 2021-06-14 NOTE — Progress Notes (Signed)
Stephen Diaz       [place] [address]             [phone]     HPI: Stephen Diaz returns for a scheduled postoperative follow-up visit.  Stephen Diaz is a 66 year old man with a history of tobacco abuse, COPD, type 2 diabetes, hypertension, arthritis, and right foot drop.  He was found to have a lung nodule on a low-dose CT for lung cancer screening.  That was found about a year ago and was cystic with a thin wall.  On a follow-up a year later it had increased in size and had variable wall thickness.  It was hypermetabolic on PET/CT.  I did a robotic right lower lobectomy on 05/20/2021.  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  Final stage was T2a, N0, 1B.  In the early postoperative period he developed respiratory distress and urinary retention.  He was eventually able to get his chest tube out.  He was treated with ceftriaxone for presumed pneumonia.  He finished out a course of ciprofloxacin at home.  He does have some incisional pain.  He is taking and oxycodone about once a day.  He has not been using anything else.  He says his breathing has been fair.  He has noted some wheezing.  Primary complaint today is some numbness and weakness in the left hand.  He said he first noticed that several days ago.   Current Outpatient Medications  Medication Sig Dispense Refill   albuterol (VENTOLIN HFA) 108 (90 Base) MCG/ACT inhaler Inhale 2 puffs into the lungs every 6 (six) hours as needed for wheezing or shortness of breath.     aspirin 81 MG EC tablet Take 81 mg by mouth daily.     gabapentin (NEURONTIN) 100 MG capsule Take 1 capsule (100 mg total) by mouth 2 (two) times daily. 60 capsule 3   ibuprofen (ADVIL) 200 MG tablet Take 2 tablets (400 mg total) by mouth every 6 (six) hours as needed for mild pain.     lisinopril (ZESTRIL) 10 MG tablet Take 10 mg by mouth daily.     metFORMIN (GLUCOPHAGE) 500 MG tablet Take 1,000 mg by mouth daily with  breakfast.     Multiple Vitamins-Minerals (CENTRUM SILVER 50+MEN) TABS Take 1 tablet by mouth daily.     oxyCODONE (OXY IR/ROXICODONE) 5 MG immediate release tablet Take 1 tablet (5 mg total) by mouth every 4 (four) hours as needed for moderate pain. 30 tablet 0   predniSONE (STERAPRED UNI-PAK 21 TAB) 10 MG (21) TBPK tablet Take 6 tablets (60 mg total) by mouth daily for 1 day, THEN 5 tablets (50 mg total) daily for 1 day, THEN 4 tablets (40 mg total) daily for 1 day, THEN 3 tablets (30 mg total) daily for 1 day, THEN 2 tablets (20 mg total) daily for 1 day, THEN 1 tablet (10 mg total) daily for 1 day. 21 tablet 0   tamsulosin (FLOMAX) 0.4 MG CAPS capsule Take 1 capsule (0.4 mg total) by mouth daily. 30 capsule 3   TRELEGY ELLIPTA 100-62.5-25 MCG/INH AEPB Inhale 1 puff into the lungs daily.     varenicline (CHANTIX CONTINUING MONTH PAK) 1 MG tablet Take 1 tablet (1 mg total) by mouth 2 (two) times daily. 60 tablet 1   varenicline (CHANTIX PAK) 0.5 MG X 11 & 1 MG X 42 tablet Take one 0.5 mg tablet by mouth once  daily for 3 days, then increase to one 0.5 mg tablet twice daily for 4 days, then increase to one 1 mg tablet twice daily. 53 tablet 0   No current facility-administered medications for this visit.    Physical Exam BP 107/65    Pulse 100    Resp 20    Ht 6\' 1"  (1.854 m)    Wt 185 lb (83.9 kg)    SpO2 97% Comment: RA   BMI 24.62 kg/m  66 year old man in no acute distress Alert and oriented x3.  Decreased sensation dorsum left hand.  Full range of motion but slow and some motor weakness with abduction and abduction of the fingers Cardiac regular rate and rhythm Lungs wheezes bilaterally, diminished at right base  Incisions clean dry and intact  Diagnostic Tests: I personally reviewed his chest x-ray.  There are postoperative changes on the right.  There is a pleural effusion primarily lateral but also posterior as well.  Impression:  Stephen Diaz is a 1 year old gentleman with a  history of tobacco abuse, COPD, type 2 diabetes, hypertension, arthritis, and right foot drop.  He had a cystic lung nodule found on a low-dose screening CT.  Over time that increased in size and complexity and was positive on PET/CT.  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  Stage Ib squamous cell carcinoma-will refer to Dr. Julien Nordmann in the multidisciplinary thoracic oncology clinic.  Status post right lower lobectomy-overall he is doing fairly well.  He did have postoperative respiratory distress which resolved and pneumonia which was treated with antibiotics.  He does have some incisional pain but is only having to take narcotics about once a day.  He is going to supplement that with Tylenol.  He does have a small pleural effusion.  He is also having some wheezing.  I am going to give him a prednisone taper to see if that has any effect on the pleural effusion.  Some of that may just be fluid filling space, but some of it may be recoverable.  Tobacco abuse-he quit smoking on December 8 the day prior to surgery.  He has not smoked since.  I congratulated him for that.  He complains of some weakness and numbness in the left hand which started a few days ago.  He has a full range of motion but does have some weakness.  He has a history of a foot drop.  Is possible he could have had some type of neurologic event.  Normally I would think it might be related to surgical positioning, but I think it would have shown up before now if so.  Possible this could be cervical disc disease.  We will see how he responds to steroids.  He knows to contact us immediately if it were to worsen in anyway.  Plan: Prednisone taper  Referral to Dr. 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

## 2021-06-14 NOTE — Progress Notes (Signed)
Oncology Nurse Navigator Documentation  Oncology Nurse Navigator Flowsheets 06/14/2021  Navigator Follow Up Date: 06/21/2021  Navigator Follow Up Reason: New Patient Appointment  Navigator Location [facility]  Referral Date to RadOnc/MedOnc 06/14/2021  Navigator Encounter Type Other:  Patient Visit Type Other  Treatment Phase Other  Barriers/Navigation Needs Coordination of Care/I received referral on Stephen Diaz today. I updated new patient coordinator to call and schedule him to be seen next week with labs   Interventions Coordination of Care  Acuity Level 2-Minimal Needs (1-2 Barriers Identified)  Coordination of Care Other  Time Spent with Patient 30

## 2021-06-21 ENCOUNTER — Inpatient Hospital Stay: Payer: Medicare Other

## 2021-06-21 ENCOUNTER — Encounter: Payer: Self-pay | Admitting: *Deleted

## 2021-06-21 ENCOUNTER — Encounter: Payer: Self-pay | Admitting: Internal Medicine

## 2021-06-21 ENCOUNTER — Inpatient Hospital Stay: Payer: Medicare Other | Attending: Internal Medicine | Admitting: Internal Medicine

## 2021-06-21 ENCOUNTER — Other Ambulatory Visit: Payer: Self-pay

## 2021-06-21 VITALS — BP 143/91 | HR 83 | Temp 97.3°F | Resp 18 | Wt 190.6 lb

## 2021-06-21 DIAGNOSIS — C3431 Malignant neoplasm of lower lobe, right bronchus or lung: Secondary | ICD-10-CM | POA: Diagnosis present

## 2021-06-21 DIAGNOSIS — F17210 Nicotine dependence, cigarettes, uncomplicated: Secondary | ICD-10-CM

## 2021-06-21 DIAGNOSIS — Z902 Acquired absence of lung [part of]: Secondary | ICD-10-CM

## 2021-06-21 DIAGNOSIS — C3490 Malignant neoplasm of unspecified part of unspecified bronchus or lung: Secondary | ICD-10-CM

## 2021-06-21 DIAGNOSIS — L989 Disorder of the skin and subcutaneous tissue, unspecified: Secondary | ICD-10-CM

## 2021-06-21 DIAGNOSIS — C3491 Malignant neoplasm of unspecified part of right bronchus or lung: Secondary | ICD-10-CM

## 2021-06-21 LAB — CBC WITH DIFFERENTIAL (CANCER CENTER ONLY)
Abs Immature Granulocytes: 0.11 10*3/uL — ABNORMAL HIGH (ref 0.00–0.07)
Basophils Absolute: 0.1 10*3/uL (ref 0.0–0.1)
Basophils Relative: 1 %
Eosinophils Absolute: 0.4 10*3/uL (ref 0.0–0.5)
Eosinophils Relative: 3 %
HCT: 39.2 % (ref 39.0–52.0)
Hemoglobin: 12.8 g/dL — ABNORMAL LOW (ref 13.0–17.0)
Immature Granulocytes: 1 %
Lymphocytes Relative: 27 %
Lymphs Abs: 3.3 10*3/uL (ref 0.7–4.0)
MCH: 27.7 pg (ref 26.0–34.0)
MCHC: 32.7 g/dL (ref 30.0–36.0)
MCV: 84.8 fL (ref 80.0–100.0)
Monocytes Absolute: 0.9 10*3/uL (ref 0.1–1.0)
Monocytes Relative: 8 %
Neutro Abs: 7.4 10*3/uL (ref 1.7–7.7)
Neutrophils Relative %: 60 %
Platelet Count: 423 10*3/uL — ABNORMAL HIGH (ref 150–400)
RBC: 4.62 MIL/uL (ref 4.22–5.81)
RDW: 13.7 % (ref 11.5–15.5)
WBC Count: 12.2 10*3/uL — ABNORMAL HIGH (ref 4.0–10.5)
nRBC: 0 % (ref 0.0–0.2)

## 2021-06-21 LAB — CMP (CANCER CENTER ONLY)
ALT: 24 U/L (ref 0–44)
AST: 16 U/L (ref 15–41)
Albumin: 3.7 g/dL (ref 3.5–5.0)
Alkaline Phosphatase: 70 U/L (ref 38–126)
Anion gap: 8 (ref 5–15)
BUN: 15 mg/dL (ref 8–23)
CO2: 30 mmol/L (ref 22–32)
Calcium: 8.8 mg/dL — ABNORMAL LOW (ref 8.9–10.3)
Chloride: 100 mmol/L (ref 98–111)
Creatinine: 0.65 mg/dL (ref 0.61–1.24)
GFR, Estimated: >60 mL/min (ref >60)
Glucose, Bld: 118 mg/dL — ABNORMAL HIGH (ref 70–99)
Potassium: 4.5 mmol/L (ref 3.5–5.1)
Sodium: 138 mmol/L (ref 135–145)
Total Bilirubin: 0.2 mg/dL — ABNORMAL LOW (ref 0.3–1.2)
Total Protein: 7.2 g/dL (ref 6.5–8.1)

## 2021-06-21 NOTE — Progress Notes (Signed)
Oncology Nurse Navigator Documentation  Oncology Nurse Navigator Flowsheets 06/21/2021 06/14/2021  Abnormal Finding Date 04/22/2021 -  Confirmed Diagnosis Date 05/20/2021 -  Diagnosis Status Confirmed Diagnosis Complete -  Planned Course of Treatment Surgery -  Phase of Treatment Surgery -  Surgery Actual Start Date: 05/20/2021 -  Navigator Follow Up Date: - 06/21/2021  Navigator Follow Up Reason: - New Patient Appointment  Navigator Location [facility]  Referral Date to RadOnc/MedOnc - 06/14/2021  Navigator Encounter Type Clinic/MDC Other:  Treatment Initiated Date 05/20/2021 -  Patient Visit Type Initial;MedOnc Other  Treatment Phase Follow-up Other  Barriers/Navigation Needs Education Coordination of Care  Education Newly Diagnosed Cancer Education;Other/I spoke to Stephen Diaz today at his first visit with Dr. Julien Nordmann. He had surgery for stage I lung cancer. His treatment plan for medical oncology is observation and follow up in 6  months. I helped to explain and gave information on dx and treatment plan as well as resources if needed.  -  Interventions Education;Psycho-Social Support Coordination of Care  Acuity Level 3-Moderate Needs (3-4 Barriers Identified) Level 2-Minimal Needs (1-2 Barriers Identified)  Coordination of Care - Other  Education Method Verbal;Written -  Time Spent with Patient 45 30

## 2021-06-21 NOTE — Progress Notes (Signed)
[place] [phone]   [phone]  CONSULT NOTE  REFERRING PHYSICIAN: Dr. Modesto Charon  REASON FOR CONSULTATION:  66 years old white male recently diagnosed with lung cancer.  HPI Stephen Diaz is a 66 y.o. male with past medical history significant for hypertension, diabetes mellitus, COPD, osteoarthritis and long history of smoking.  The patient was followed by CT screening program for his lung cancer.  He had a scan in 2021 that showed small right lower lobe lung nodule.  This was followed by CT screening scan of the chest on March 14, 2021 and that showed 2.8 cm partially solid nodule in the superior segment of the right lower lobe.  The patient was referred to Dr. Roxan Hockey and he had a PET scan on April 01, 2021 and that showed cystic area in the right lower lobe with irregular wall displaying markedly increased metabolic activity with maximum SUV of 6.48.  There is also skin thickening about the left scrotum with increased metabolic activity that is nonspecific.  The patient had nodularity and the left gluteal fold with increased metabolic activity raising question of cutaneous neoplasm.  On May 20, 2021 the patient underwent robotic assisted right lower lobectomy with lymph node dissection under the care of Dr. Roxan Hockey. 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  The resection margins were negative for malignancy. The patient was referred to me today for evaluation and recommendation regarding his condition. When seen today he is feeling fine with no concerning complaints except for shortness of breath with exertion and mild cough but no significant chest pain or hemoptysis.  He denied having any nausea, vomiting, diarrhea or constipation.  He has no headache but has occasional visual changes.  He  has no weight loss or night sweats. Family history is unremarkable except for a maternal aunt with lung cancer. 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  He is currently retired and used to [employer].  He has a history for smoking for close to 50 years and quit in December 2022.  He has no history of alcohol or drug abuse.  HPI  Past Medical History:  Diagnosis Date   Arthritis    right shoulder   COPD (chronic obstructive pulmonary disease) (HCC)    Diabetes mellitus without complication (HCC)    Type II   Hypertension    Right foot drop    from pinched nerve in back, wears brace    Past Surgical History:  Procedure Laterality Date   CATARACT EXTRACTION Left    CATARACT EXTRACTION W/PHACO Right 08/13/2020   Procedure: CATARACT EXTRACTION PHACO AND INTRAOCULAR LENS PLACEMENT ([place]);  Surgeon: Baruch Goldmann, MD;  Location: [facility];  Service: Ophthalmology;  Laterality: Right;  CDE: 8.24   COLONOSCOPY  12/2020   Dr. Adelina Mings (general surgeon): diverticulsos and left sided colitis with erythema in the sigmoid and descending colon. bx: focal mild acute colitis ?infection vs ischemia   COLONOSCOPY  10/2009   Dr. Carlyon Prows Fields: diverticulosis, two tubular adenomas removed. descending colon erythema and mild ulcerations noted with benign biopsies.   EYE SURGERY     HERNIA REPAIR     INTERCOSTAL NERVE BLOCK Right 05/20/2021   Procedure: INTERCOSTAL NERVE BLOCK;  Surgeon: Melrose Nakayama, MD;  Location: [place];  Service: Thoracic;  Laterality: Right;   LYMPH NODE DISSECTION  Right 05/20/2021   Procedure: LYMPH NODE DISSECTION;  Surgeon: Melrose Nakayama, MD;  Location: [facility];  Service: Thoracic;  Laterality: Right;   REVERSE SHOULDER ARTHROPLASTY Right 09/08/2020   Procedure: REVERSE TOTAL SHOULDER ARTHROPLASTY;  Surgeon: Hiram Gash, MD;  Location: [facility];  Service: Orthopedics;  Laterality: Right;   screws and steel plate in neck  (A4-5)      10-12 years ago  x2    Family History  Problem Relation Age of Onset   Crohn's disease Cousin    Colon cancer Neg Hx     Social History Social History   Tobacco Use   Smoking status: Every Day    Packs/day: 0.25    Years: 50.00    Pack years: 12.50    Types: Cigarettes    Start date: 1973   Smokeless tobacco: Never   Tobacco comments:    Currently smoking 3-4 cigs per day as of 03/23/21 ep  Vaping Use   Vaping Use: Never used  Substance Use Topics   Alcohol use: Not Currently   Drug use: Not Currently    No Known Allergies  Current Outpatient Medications  Medication Sig Dispense Refill   albuterol (VENTOLIN HFA) 108 (90 Base) MCG/ACT inhaler Inhale 2 puffs into the lungs every 6 (six) hours as needed for wheezing or shortness of breath.     aspirin 81 MG EC tablet Take 81 mg by mouth daily.     gabapentin (NEURONTIN) 100 MG capsule Take 1 capsule (100 mg total) by mouth 2 (two) times daily. 60 capsule 3   ibuprofen (ADVIL) 200 MG tablet Take 2 tablets (400 mg total) by mouth every 6 (six) hours as needed for mild pain.     lisinopril (ZESTRIL) 10 MG tablet Take 10 mg by mouth daily.     metFORMIN (GLUCOPHAGE) 500 MG tablet Take 1,000 mg by mouth daily with breakfast.     Multiple Vitamins-Minerals (CENTRUM SILVER 50+MEN) TABS Take 1 tablet by mouth daily.     oxyCODONE (OXY IR/ROXICODONE) 5 MG immediate release tablet Take 1 tablet (5 mg total) by mouth every 4 (four) hours as needed for moderate pain. 30 tablet 0   tamsulosin (FLOMAX) 0.4 MG CAPS capsule Take 1 capsule (0.4 mg total) by mouth daily. 30 capsule 3   TRELEGY ELLIPTA 100-62.5-25 MCG/INH AEPB Inhale 1 puff into the lungs daily.     varenicline (CHANTIX CONTINUING MONTH PAK) 1 MG tablet Take 1 tablet (1 mg total) by mouth 2 (two) times daily. 60 tablet 1   varenicline (CHANTIX PAK) 0.5 MG X 11 & 1 MG X 42 tablet Take one 0.5 mg tablet by mouth once daily for 3 days, then increase to one 0.5 mg tablet twice  daily for 4 days, then increase to one 1 mg tablet twice daily. 53 tablet 0   No current facility-administered medications for this visit.    Review of Systems  Constitutional: positive for fatigue Eyes: negative Ears, nose, mouth, throat, and face: negative Respiratory: positive for cough and dyspnea on exertion Cardiovascular: negative Gastrointestinal: negative Genitourinary:negative Integument/breast: negative Hematologic/lymphatic: negative Musculoskeletal:negative Neurological: negative Behavioral/Psych: negative Endocrine: negative Allergic/Immunologic: negative  Physical Exam  XMI:WOEHO, healthy, no distress, well nourished, well developed, and anxious SKIN: skin color, texture, turgor are normal, no rashes or significant lesions HEAD: Normocephalic, No masses, lesions, tenderness or abnormalities EYES: normal, PERRLA, Conjunctiva are pink and non-injected EARS: External ears normal, Canals clear OROPHARYNX:no exudate, no erythema, and lips, buccal  mucosa, and tongue normal  NECK: supple, no adenopathy, no JVD LYMPH:  no palpable lymphadenopathy, no hepatosplenomegaly LUNGS: clear to auscultation , and palpation HEART: regular rate & rhythm, no murmurs, and no gallops ABDOMEN:abdomen soft, non-tender, normal bowel sounds, and no masses or organomegaly BACK: Back symmetric, no curvature., No CVA tenderness EXTREMITIES:no joint deformities, effusion, or inflammation, no edema  NEURO: alert & oriented x 3 with fluent speech, no focal motor/sensory deficits  PERFORMANCE STATUS: ECOG 1  LABORATORY DATA: Lab Results  Component Value Date   WBC 12.2 (H) 06/21/2021   HGB 12.8 (L) 06/21/2021   HCT 39.2 06/21/2021   MCV 84.8 06/21/2021   PLT 423 (H) 06/21/2021      Chemistry      Component Value Date/Time   NA 130 (L) 06/01/2021 0252   K 4.1 06/01/2021 0252   CL 97 (L) 06/01/2021 0252   CO2 25 06/01/2021 0252   BUN 8 06/01/2021 0252   CREATININE 0.60 (L)  06/01/2021 0252      Component Value Date/Time   CALCIUM 7.9 (L) 06/01/2021 0252   ALKPHOS 58 05/22/2021 0105   AST 33 05/22/2021 0105   ALT 23 05/22/2021 0105   BILITOT 0.5 05/22/2021 0105       RADIOGRAPHIC STUDIES: DG Chest 1 View  Addendum Date: 05/25/2021   ADDENDUM REPORT: 05/25/2021 10:22 ADDENDUM: Impression #2 was called by telephone at the time of interpretation on 05/25/2021 at 10:22 am to provider PA Tacy Dura, who verbally acknowledged these results. Electronically Signed   By: Kellie Simmering D.O.   On: 05/25/2021 10:22   Result Date: 05/25/2021 CLINICAL DATA:  Provided history: Right chest tube. EXAM: CHEST  1 VIEW COMPARISON:  Prior chest radiographs 05/24/2021 and earlier. FINDINGS: Similar position of a right basilar chest tube. There has been an interval increase in size of a now small-to-moderate right pneumothorax. The pneumothorax is now predominantly basilar. Ill-defined opacity within the right lung base has not significantly changed. This may reflect atelectasis and/or consolidation. Minimal persistent atelectasis within the left lung base. No appreciable pleural effusion. As before, there is extensive subcutaneous gas within the right chest wall and lower right neck. Right shoulder arthroplasty. Attempts are being made to reach the ordering provider at this time. IMPRESSION: Similar position of a right basilar chest tube as compared to the chest radiograph of 05/24/2021. Interval increase in size of a now small-to-moderate right pneumothorax. The pneumothorax is now predominantly basilar. Persistent ill-defined opacity within the right lung base, which may reflect atelectasis and/or consolidation. Minimal persistent atelectasis within the left lung base. As before, there is extensive subcutaneous gas within the right chest wall and lower right neck. Electronically Signed: By: Kellie Simmering D.O. On: 05/25/2021 10:01   DG Chest 1 View  Result Date: 05/23/2021 CLINICAL  DATA:  Chest tube in place. EXAM: CHEST  1 VIEW COMPARISON:  05/22/2021 FINDINGS: Again noted is a right basilar chest tube. Negative for pneumothorax. Large amount of subcutaneous gas in the in the right chest and left lower neck are again noted. Persistent patchy densities in the right lower chest could represent atelectasis and minimally changed. Persistent volume loss in the right hemithorax. Heart and mediastinum are stable. Postsurgical changes in the neck and right shoulder. IMPRESSION: 1. Stable position of the right chest tube without a pneumothorax. 2. Persistent densities in the right lower chest that could represent atelectasis. 3. Persistent subcutaneous gas. Electronically Signed   By: Markus Daft M.D.   On: 05/23/2021  08:49   DG Chest 2 View  Result Date: 06/14/2021 CLINICAL DATA:  Shortness of breath, history of lymph node dissection 12/22 EXAM: CHEST - 2 VIEW COMPARISON:  Chest radiograph 06/01/2021 FINDINGS: The cardiomediastinal silhouette is within normal limits. There remains a small to medium size right pleural effusion, decreased in size since 06/01/2021. Aeration in the right lung has significantly improved compared to that study. The left lung is clear. There is no significant left effusion. There is no pneumothorax. The bones are stable. Right shoulder arthroplasty hardware is again noted. IMPRESSION: Persistent small to medium size right pleural effusion, decreased in size since 06/01/2021, with significantly improved aeration in the right lung. Electronically Signed   By: Valetta Mole M.D.   On: 06/14/2021 10:25   DG Chest 2 View  Result Date: 06/01/2021 CLINICAL DATA:  Right lower lobectomy EXAM: CHEST - 2 VIEW COMPARISON:  05/31/2021 FINDINGS: Airspace disease throughout the right lung, most confluent in the right lower lung. Small right pleural effusion. Small right apical pneumothorax, stable. No confluent opacity on the left. Heart is normal size. IMPRESSION: Postoperative  changes volume loss on the right. Airspace disease throughout the right lung with stable small right pleural effusion and right apical pneumothorax. Electronically Signed   By: Rolm Baptise M.D.   On: 06/01/2021 12:19   DG Chest 2 View  Result Date: 05/26/2021 CLINICAL DATA:  Pneumothorax, follow-up EXAM: CHEST - 2 VIEW COMPARISON:  05/25/2021 FINDINGS: Right basilar chest tube is not as well seen. No substantial change in right pneumothorax. Similar subcutaneous emphysema. Similar lung aeration with interstitial changes and bibasilar atelectasis. Stable cardiomediastinal contours. IMPRESSION: No substantial change in right pneumothorax. Similar subcutaneous emphysema. Lung aeration is also similar. Electronically Signed   By: Macy Mis M.D.   On: 05/26/2021 09:34   DG Chest 1V REPEAT Same Day  Result Date: 05/31/2021 CLINICAL DATA:  Chest tube removal EXAM: CHEST - 1 VIEW SAME DAY COMPARISON:  05/31/2021 FINDINGS: Stable appearance of right hydropneumothorax with small air component. Right basilar atelectasis. Stable cardiomediastinal contours. IMPRESSION: Stable appearance of right hydropneumothorax with small air component. Electronically Signed   By: Macy Mis M.D.   On: 05/31/2021 15:47   DG Chest Port 1 View  Result Date: 05/31/2021 CLINICAL DATA:  Status post chest tube removal with respiratory distress EXAM: PORTABLE CHEST 1 VIEW COMPARISON:  05/31/2021 FINDINGS: Cardiac shadow is within normal limits. Persistent consolidation and effusion is noted in the right lung base stable from the prior exam. Remainder of the right lung is clear. Left lung is clear as well. No bony abnormality is noted. No discrete pneumothorax is noted. Previously described pneumothorax may have been related to chest tube tract on the right IMPRESSION: Persistent consolidation and effusion in the right base stable from the previous exam. Linear density is noted along the tract of the previous chest tube in the  right apex. No definitive pneumothorax is seen. Electronically Signed   By: Inez Catalina M.D.   On: 05/31/2021 20:58   DG Chest Port 1 View  Result Date: 05/31/2021 CLINICAL DATA:  Status post lobectomy of lung. EXAM: PORTABLE CHEST 1 VIEW COMPARISON:  05/30/2021. FINDINGS: Similar appearance of a right hydropneumothorax with similar small air component. Similar subcutaneous emphysema along the right chest wall. Similar mild bibasilar atelectasis. Similar cardiomediastinal silhouette. Partially imaged right reverse shoulder arthroplasty. IMPRESSION: 1. Similar right hydropneumothorax with small air component. 2. Similar bibasilar atelectasis. Electronically Signed   By: Jamesetta So.D.  On: 05/31/2021 08:47   DG CHEST PORT 1 VIEW  Result Date: 05/30/2021 CLINICAL DATA:  Pneumothorax EXAM: PORTABLE CHEST 1 VIEW COMPARISON:  05/29/2021 FINDINGS: Right chest tube remains in place. Small right pleural effusion, increasing since prior study. Effusion now fills the previously seen pneumothorax at the right lung base. Small residual apical component of the pneumothorax noted. Bibasilar atelectasis. Heart is normal size. IMPRESSION: Right chest tube remains in place, unchanged. Small residual right apical pneumothorax. The previously seen basilar component is now filled with fluid/effusion. Bibasilar atelectasis. Electronically Signed   By: Rolm Baptise M.D.   On: 05/30/2021 08:24   DG CHEST PORT 1 VIEW  Result Date: 05/29/2021 CLINICAL DATA:  Status post lobectomy.  Follow-up study. EXAM: PORTABLE CHEST 1 VIEW COMPARISON:  05/28/2021 and older exams. FINDINGS: Small right-sided pneumothorax is without significant change, most evident the lateral lower hemithorax. Right chest tube is also stable. Lung base opacities have improved consistent with improved atelectasis. Remainder of the lungs is clear. No convincing pleural effusion. No left pneumothorax. IMPRESSION: 1. Small right pneumothorax without  significant change from the previous day's study. Stable right-sided chest tube. 2. Improved lung base atelectasis.  No new lung abnormalities. Electronically Signed   By: Lajean Manes M.D.   On: 05/29/2021 08:12   DG CHEST PORT 1 VIEW  Result Date: 05/28/2021 CLINICAL DATA:  Shortness of breath. Follow-up exam. Right chest tube no pneumothorax. EXAM: PORTABLE CHEST 1 VIEW COMPARISON:  05/27/2021 and older exams. FINDINGS: Small right-sided pneumothorax, most evident at the right lateral lung base, mildly decreased in size from the previous day's study. Right-sided chest tube is stable, tip at the right apex. Persistent linear opacities at the lung bases consistent with atelectasis. Remainder of the lungs is clear. Right anterolateral chest wall and neck base subcutaneous emphysema mildly decreased from the previous day's study. IMPRESSION: 1. Mild interval decrease in the size of the right pneumothorax as well as the right chest and neck base subcutaneous emphysema. 2. No other change. Mild persistent lung base atelectasis. Stable right-sided chest tube. Electronically Signed   By: Lajean Manes M.D.   On: 05/28/2021 09:21   DG CHEST PORT 1 VIEW  Result Date: 05/27/2021 CLINICAL DATA:  Status post right lobectomy. EXAM: PORTABLE CHEST 1 VIEW COMPARISON:  May 26, 2021. FINDINGS: The heart size and mediastinal contours are within normal limits. Left lung is clear. Stable position of right-sided chest tube. Stable right apical and basilar pneumothorax is noted. Subcutaneous emphysema is again noted over right lateral chest wall and supraclavicular region. Status post right shoulder arthroplasty. IMPRESSION: Stable position of right-sided chest tube. Stable right-sided pneumothorax. Stable subcutaneous Electronically Signed   By: Marijo Conception M.D.   On: 05/27/2021 08:18   DG Chest Port 1 View  Result Date: 05/24/2021 CLINICAL DATA:  Right chest tube in place difficulty breathing EXAM: PORTABLE  CHEST 1 VIEW COMPARISON:  Previous studies including the examination of 05/23/2021 FINDINGS: Tip of right chest tube is noted in the medial right lower lung fields. There is possible minimal right apical pneumothorax. Extensive subcutaneous emphysema is seen in the right chest wall and neck with possible worsening. There are increased markings in both lower lung fields, more so on the right side with interval worsening. There are no signs of pulmonary edema or focal pulmonary consolidation. Possible calcified granuloma is seen in the right upper lung fields. There is previous right shoulder arthroplasty. IMPRESSION: Right chest tube is noted in place. There is  possible small right apical pneumothorax. Increased markings are seen in both lower lung fields, more so on the right side with interval worsening. This finding may suggest subsegmental atelectasis/pneumonitis. Electronically Signed   By: Elmer Picker M.D.   On: 05/24/2021 08:19    ASSESSMENT: This is a very pleasant 66 years old white male recently diagnosed with a stage IB (T2 a, N0, M0) non-small cell lung cancer, squamous cell carcinoma with keratinization presented with superior segment right lower lobe lung nodule status post right lower lobectomy with lymph node dissection under the care of Dr. Roxan Hockey on May 20, 2021. His previous PET scan also showed suspicious skin lesion in the gluteal area and scrotum.  PLAN: I had a lengthy discussion with the patient today about his current disease stage, prognosis and treatment options. I explained to the patient that there is no survival benefit for adjuvant systemic chemotherapy for patient with stage Ib if the tumor size is less than 4.0 cm. I also explained to the patient that the standard of care is continuous observation and close monitoring. I will arrange for the patient to come back for follow-up visit in 6 months with repeat CT scan of the chest for restaging of his disease. For  the suspicious skin lesion and the gluteal area and scrotum, I will refer the patient to dermatology for evaluation and to rule out any skin cancer. For the smoking cessation, I strongly encouraged the patient to continue with his current decision of quitting smoking. The patient was advised to call immediately if he has any other concerning symptoms in the interval. The patient voices understanding of current disease status and treatment options and is in agreement with the current care plan.  All questions were answered. The patient knows to call the clinic with any problems, questions or concerns. We can certainly see the patient much sooner if necessary.  Thank you so much for allowing me to participate in the care of Stephen Diaz. I will continue to follow up the patient with you and assist in his care.  The total time spent in the appointment was 60 minutes.  Disclaimer: This note was dictated with voice recognition software. Similar sounding words can inadvertently be transcribed and may not be corrected upon review.   Eilleen Kempf June 21, 2021, 1:51 PM

## 2021-06-24 ENCOUNTER — Encounter (HOSPITAL_COMMUNITY): Payer: Self-pay | Admitting: *Deleted

## 2021-06-24 ENCOUNTER — Emergency Department (HOSPITAL_COMMUNITY)
Admission: EM | Admit: 2021-06-24 | Discharge: 2021-06-24 | Disposition: A | Discharge status: Home / Self Care | Payer: Medicare Other | Attending: Emergency Medicine | Admitting: Emergency Medicine

## 2021-06-24 ENCOUNTER — Other Ambulatory Visit: Payer: Self-pay

## 2021-06-24 DIAGNOSIS — E119 Type 2 diabetes mellitus without complications: Secondary | ICD-10-CM | POA: Insufficient documentation

## 2021-06-24 DIAGNOSIS — I10 Essential (primary) hypertension: Secondary | ICD-10-CM | POA: Diagnosis not present

## 2021-06-24 DIAGNOSIS — G588 Other specified mononeuropathies: Secondary | ICD-10-CM

## 2021-06-24 DIAGNOSIS — G589 Mononeuropathy, unspecified: Secondary | ICD-10-CM | POA: Insufficient documentation

## 2021-06-24 DIAGNOSIS — R202 Paresthesia of skin: Secondary | ICD-10-CM | POA: Diagnosis present

## 2021-06-24 DIAGNOSIS — Z7982 Long term (current) use of aspirin: Secondary | ICD-10-CM | POA: Insufficient documentation

## 2021-06-24 DIAGNOSIS — Z7984 Long term (current) use of oral hypoglycemic drugs: Secondary | ICD-10-CM | POA: Insufficient documentation

## 2021-06-24 DIAGNOSIS — Z79899 Other long term (current) drug therapy: Secondary | ICD-10-CM | POA: Diagnosis not present

## 2021-06-24 HISTORY — DX: Malignant (primary) neoplasm, unspecified: C80.1

## 2021-06-24 NOTE — Discharge Instructions (Signed)
Please see the attached follow-up information for Dr. Merlene Laughter, he is a local neurologist, you will need to follow-up with him in the outpatient clinic.  Your testing suggest today that you have a nerve that is being inflamed, this will not likely get better very fast, I would strongly recommend that you follow-up with a neurologist but please come back to the hospital immediately if you should develop any other symptoms including weakness or numbness of any other part of your body, difficulty walking, difficulty talking, changes in speech or facial droop or any other concerning symptoms

## 2021-06-24 NOTE — ED Triage Notes (Signed)
Pt c/o left hand numbness since December 9 after he had part of his lung removed.

## 2021-06-24 NOTE — ED Provider Notes (Signed)
[facility] Provider Note   CSN: [id number] Arrival date & time: 06/24/21  1318     History  Chief Complaint  Patient presents with   Numbness    Stephen Diaz is a 66 y.o. male.  HPI  This patient is a 66 year old male, he has a history of diabetes on metformin, he has chronic lung disease on Trelegy and hypertension on lisinopril.  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  He is not currently on chemotherapy or radiation.  He reports that when he got home from the hospital around 2 weeks ago he noticed that his left hand was not working properly.  He reports that he cannot open or close his hand very well, feels that it is weak and cannot grip things per usual.  He denies any numbness in the arm and has had no symptoms in his legs has no headache has no blurred vision has no slurred speech, no difficulty with gait, he is right-hand dominant and has no difficulty with coordination on his right side.  This is been constant for 2 weeks without any changes whatsoever.  He has not talked to his family doctor about this, I have reviewed the notes from the oncology visit and his surgical visit postoperatively and he did not mention this at those times.  Home Medications Prior to Admission medications   Medication Sig Start Date End Date Taking? Authorizing Provider  albuterol (VENTOLIN HFA) 108 (90 Base) MCG/ACT inhaler Inhale 2 puffs into the lungs every 6 (six) hours as needed for wheezing or shortness of breath.    [provider]  aspirin 81 MG EC tablet Take 81 mg by mouth daily.    [provider]  ibuprofen (ADVIL) 200 MG tablet Take 2 tablets (400 mg total) by mouth every 6 (six) hours as needed for mild pain. Patient not taking: Reported on 06/21/2021 06/02/21   Barrett, Junie Panning R, PA-C  lisinopril (ZESTRIL) 10 MG tablet Take 10 mg by mouth daily.     [provider]  metFORMIN (GLUCOPHAGE) 500 MG tablet Take 1,000 mg by mouth daily with breakfast. 05/10/20   [provider]  Multiple Vitamins-Minerals (CENTRUM SILVER 50+MEN) TABS Take 1 tablet by mouth daily.    [provider]  oxyCODONE (OXY IR/ROXICODONE) 5 MG immediate release tablet Take 1 tablet (5 mg total) by mouth every 4 (four) hours as needed for moderate pain. 06/02/21   Barrett, Erin R, PA-C  TRELEGY ELLIPTA 100-62.5-25 MCG/INH AEPB Inhale 1 puff into the lungs daily. 01/01/21   [provider]      Allergies    Patient has no known allergies.    Review of Systems   Review of Systems  All other systems reviewed and are negative.  Physical Exam Updated Vital Signs BP 122/82 (BP Location: Left Arm)    Pulse (!) 105    Temp 98.3 F (36.8 C) (Oral)    Resp 18    Ht 1.854 m (6\' 1" )    Wt 86.5 kg    SpO2 98%    BMI 25.15 kg/m  Physical Exam Vitals and nursing note reviewed.  Constitutional:      General: He is not in acute distress.    Appearance: He is well-developed.  HENT:     Head: Normocephalic and atraumatic.     Mouth/Throat:     Pharynx: No oropharyngeal exudate.  Eyes:  General: No scleral icterus.       Right eye: No discharge.        Left eye: No discharge.     Conjunctiva/sclera: Conjunctivae normal.     Pupils: Pupils are equal, round, and reactive to light.  Neck:     Thyroid: No thyromegaly.     Vascular: No JVD.  Cardiovascular:     Rate and Rhythm: Normal rate and regular rhythm.     Heart sounds: Normal heart sounds. No murmur heard.   No friction rub. No gallop.  Pulmonary:     Effort: Pulmonary effort is normal. No respiratory distress.     Breath sounds: Normal breath sounds. No wheezing or rales.  Abdominal:     General: Bowel sounds are normal. There is no distension.     Palpations: Abdomen is soft. There is no mass.     Tenderness: There is no abdominal tenderness.  Musculoskeletal:         General: No tenderness. Normal range of motion.     Cervical back: Normal range of motion and neck supple.  Lymphadenopathy:     Cervical: No cervical adenopathy.  Skin:    General: Skin is warm and dry.     Findings: No erythema or rash.  Neurological:     Mental Status: He is alert.     Coordination: Coordination normal.     Comments: The patient has totally normal right upper extremity and bilateral lower extremities with regards to strength and sensation.  The left upper extremity has normal strength of the shoulder and at the elbow to both flexion and extension, he has a slightly weakened grip on the left side and has no ability to abduct his fingers.  Normal sensation to the left upper extremity diffusely, cranial nerves III through XII are normal  Psychiatric:        Behavior: Behavior normal.    ED Results / Procedures / Treatments   Labs (all labs ordered are listed, but only abnormal results are displayed) Labs Reviewed - No data to display  EKG None  Radiology No results found.  Procedures Procedures    Medications Ordered in ED Medications - No data to display  ED Course/ Medical Decision Making/ A&P                           Medical Decision Making  This patient presents to the ED for concern of weakness of left hand, this involves an extensive number of treatment options, and is a complaint that carries with it a high risk of complications and morbidity.  The differential diagnosis includes stroke, peripheral neuropathy   Co morbidities that complicate the patient evaluation  Hypertension, diabetes, lung cancer   Additional history obtained:  Additional history obtained from medical record External records from outside source obtained and reviewed including admission to the hospital for surgical resection, office follow-up visits for oncology and cardiothoracic surgery    Test Considered:  CT scan of the brain and MRI, Dr. Cheral Marker consulted - he  agrees with out pt -follow-up and recommends against advanced neuroimaging at this time   Critical Interventions:  Consultation with neurology   Consultations Obtained:  I requested consultation with the neurologist Dr. Cheral Marker,  and discussed lab and imaging findings as well as pertinent plan - they recommend: Outpatient follow-up   Problem List / ED Course:  Peripheral neuropathy needs close follow-up   Reevaluation:  After the interventions  noted above, I reevaluated the patient and found that they have :stayed the same   Dispostion:  After consideration of the diagnostic results and the patients response to treatment, I feel that the patent would benefit from discharge.          Final Clinical Impression(s) / ED Diagnoses Final diagnoses:  Other mononeuropathy        Noemi Chapel, MD 06/24/21 1857

## 2021-06-27 ENCOUNTER — Telehealth: Payer: Self-pay | Admitting: Dermatology

## 2021-06-27 NOTE — Telephone Encounter (Signed)
Patient is calling for a referral appointment from Curt Bears, M.D.  Patient is scheduled for 01/11/2022 at 2:30 with Lavonna Monarch, M.D.

## 2021-06-28 NOTE — Telephone Encounter (Signed)
Referral attached to appointment

## 2021-07-02 ENCOUNTER — Inpatient Hospital Stay (HOSPITAL_COMMUNITY)
Admission: EM | Admit: 2021-07-02 | Discharge: 2021-07-05 | DRG: 080 | Disposition: A | Discharge status: Home / Self Care | Payer: Medicare Other | Attending: Internal Medicine | Admitting: Internal Medicine

## 2021-07-02 ENCOUNTER — Other Ambulatory Visit: Payer: Self-pay

## 2021-07-02 DIAGNOSIS — C3491 Malignant neoplasm of unspecified part of right bronchus or lung: Secondary | ICD-10-CM | POA: Diagnosis present

## 2021-07-02 DIAGNOSIS — Z7982 Long term (current) use of aspirin: Secondary | ICD-10-CM

## 2021-07-02 DIAGNOSIS — Z720 Tobacco use: Secondary | ICD-10-CM | POA: Diagnosis present

## 2021-07-02 DIAGNOSIS — E119 Type 2 diabetes mellitus without complications: Secondary | ICD-10-CM

## 2021-07-02 DIAGNOSIS — C7931 Secondary malignant neoplasm of brain: Secondary | ICD-10-CM | POA: Diagnosis present

## 2021-07-02 DIAGNOSIS — D496 Neoplasm of unspecified behavior of brain: Secondary | ICD-10-CM | POA: Diagnosis not present

## 2021-07-02 DIAGNOSIS — G936 Cerebral edema: Secondary | ICD-10-CM | POA: Diagnosis not present

## 2021-07-02 DIAGNOSIS — J432 Centrilobular emphysema: Secondary | ICD-10-CM | POA: Diagnosis present

## 2021-07-02 DIAGNOSIS — G8190 Hemiplegia, unspecified affecting unspecified side: Secondary | ICD-10-CM

## 2021-07-02 DIAGNOSIS — J90 Pleural effusion, not elsewhere classified: Secondary | ICD-10-CM | POA: Diagnosis present

## 2021-07-02 DIAGNOSIS — C3431 Malignant neoplasm of lower lobe, right bronchus or lung: Secondary | ICD-10-CM | POA: Diagnosis present

## 2021-07-02 DIAGNOSIS — Z96611 Presence of right artificial shoulder joint: Secondary | ICD-10-CM | POA: Diagnosis present

## 2021-07-02 DIAGNOSIS — J449 Chronic obstructive pulmonary disease, unspecified: Secondary | ICD-10-CM | POA: Diagnosis present

## 2021-07-02 DIAGNOSIS — Z20822 Contact with and (suspected) exposure to covid-19: Secondary | ICD-10-CM | POA: Diagnosis present

## 2021-07-02 DIAGNOSIS — D330 Benign neoplasm of brain, supratentorial: Secondary | ICD-10-CM | POA: Diagnosis not present

## 2021-07-02 DIAGNOSIS — M21371 Foot drop, right foot: Secondary | ICD-10-CM | POA: Diagnosis present

## 2021-07-02 DIAGNOSIS — E43 Unspecified severe protein-calorie malnutrition: Secondary | ICD-10-CM | POA: Diagnosis present

## 2021-07-02 DIAGNOSIS — R531 Weakness: Secondary | ICD-10-CM

## 2021-07-02 DIAGNOSIS — R52 Pain, unspecified: Secondary | ICD-10-CM

## 2021-07-02 DIAGNOSIS — Z7951 Long term (current) use of inhaled steroids: Secondary | ICD-10-CM

## 2021-07-02 DIAGNOSIS — Z87891 Personal history of nicotine dependence: Secondary | ICD-10-CM

## 2021-07-02 DIAGNOSIS — N400 Enlarged prostate without lower urinary tract symptoms: Secondary | ICD-10-CM | POA: Diagnosis present

## 2021-07-02 DIAGNOSIS — Z902 Acquired absence of lung [part of]: Secondary | ICD-10-CM

## 2021-07-02 DIAGNOSIS — N401 Enlarged prostate with lower urinary tract symptoms: Secondary | ICD-10-CM | POA: Diagnosis present

## 2021-07-02 DIAGNOSIS — I10 Essential (primary) hypertension: Secondary | ICD-10-CM | POA: Diagnosis present

## 2021-07-02 DIAGNOSIS — R350 Frequency of micturition: Secondary | ICD-10-CM | POA: Diagnosis present

## 2021-07-02 DIAGNOSIS — R296 Repeated falls: Secondary | ICD-10-CM | POA: Diagnosis present

## 2021-07-02 DIAGNOSIS — Z7984 Long term (current) use of oral hypoglycemic drugs: Secondary | ICD-10-CM

## 2021-07-02 DIAGNOSIS — G8194 Hemiplegia, unspecified affecting left nondominant side: Secondary | ICD-10-CM | POA: Diagnosis present

## 2021-07-02 DIAGNOSIS — D499 Neoplasm of unspecified behavior of unspecified site: Secondary | ICD-10-CM | POA: Diagnosis present

## 2021-07-02 DIAGNOSIS — D72829 Elevated white blood cell count, unspecified: Secondary | ICD-10-CM | POA: Diagnosis present

## 2021-07-02 DIAGNOSIS — Z6824 Body mass index (BMI) 24.0-24.9, adult: Secondary | ICD-10-CM

## 2021-07-02 LAB — DIFFERENTIAL
Abs Immature Granulocytes: 0.07 10*3/uL (ref 0.00–0.07)
Basophils Absolute: 0.1 10*3/uL (ref 0.0–0.1)
Basophils Relative: 1 %
Eosinophils Absolute: 0.3 10*3/uL (ref 0.0–0.5)
Eosinophils Relative: 2 %
Immature Granulocytes: 1 %
Lymphocytes Relative: 27 %
Lymphs Abs: 3.7 10*3/uL (ref 0.7–4.0)
Monocytes Absolute: 1 10*3/uL (ref 0.1–1.0)
Monocytes Relative: 7 %
Neutro Abs: 8.5 10*3/uL — ABNORMAL HIGH (ref 1.7–7.7)
Neutrophils Relative %: 62 %

## 2021-07-02 LAB — CBC
HCT: 40.2 % (ref 39.0–52.0)
Hemoglobin: 13.2 g/dL (ref 13.0–17.0)
MCH: 27.8 pg (ref 26.0–34.0)
MCHC: 32.8 g/dL (ref 30.0–36.0)
MCV: 84.6 fL (ref 80.0–100.0)
Platelets: 353 10*3/uL (ref 150–400)
RBC: 4.75 MIL/uL (ref 4.22–5.81)
RDW: 13.7 % (ref 11.5–15.5)
WBC: 13.5 10*3/uL — ABNORMAL HIGH (ref 4.0–10.5)
nRBC: 0 % (ref 0.0–0.2)

## 2021-07-02 LAB — COMPREHENSIVE METABOLIC PANEL
ALT: 20 U/L (ref 0–44)
AST: 20 U/L (ref 15–41)
Albumin: 3.7 g/dL (ref 3.5–5.0)
Alkaline Phosphatase: 82 U/L (ref 38–126)
Anion gap: 10 (ref 5–15)
BUN: 12 mg/dL (ref 8–23)
CO2: 23 mmol/L (ref 22–32)
Calcium: 9.3 mg/dL (ref 8.9–10.3)
Chloride: 103 mmol/L (ref 98–111)
Creatinine, Ser: 0.7 mg/dL (ref 0.61–1.24)
GFR, Estimated: >60 mL/min (ref >60)
Glucose, Bld: 133 mg/dL — ABNORMAL HIGH (ref 70–99)
Potassium: 4.3 mmol/L (ref 3.5–5.1)
Sodium: 136 mmol/L (ref 135–145)
Total Bilirubin: 0.5 mg/dL (ref 0.3–1.2)
Total Protein: 7.7 g/dL (ref 6.5–8.1)

## 2021-07-02 LAB — PROTIME-INR
INR: 1.1 (ref 0.8–1.2)
Prothrombin Time: 14.7 seconds (ref 11.4–15.2)

## 2021-07-02 LAB — I-STAT CHEM 8, ED
BUN: 12 mg/dL (ref 8–23)
Calcium, Ion: 1.17 mmol/L (ref 1.15–1.40)
Chloride: 102 mmol/L (ref 98–111)
Creatinine, Ser: 0.7 mg/dL (ref 0.61–1.24)
Glucose, Bld: 124 mg/dL — ABNORMAL HIGH (ref 70–99)
HCT: 41 % (ref 39.0–52.0)
Hemoglobin: 13.9 g/dL (ref 13.0–17.0)
Potassium: 4.2 mmol/L (ref 3.5–5.1)
Sodium: 137 mmol/L (ref 135–145)
TCO2: 27 mmol/L (ref 22–32)

## 2021-07-02 LAB — RESP PANEL BY RT-PCR (FLU A&B, COVID) ARPGX2
Influenza A by PCR: NEGATIVE
Influenza B by PCR: NEGATIVE
SARS Coronavirus 2 by RT PCR: NEGATIVE

## 2021-07-02 LAB — APTT: aPTT: 29 seconds (ref 24–36)

## 2021-07-02 LAB — ETHANOL: Alcohol, Ethyl (B): <10 mg/dL (ref <10)

## 2021-07-02 NOTE — ED Triage Notes (Signed)
Pt c/o left arm and leg numbness for the past 2 days. Pt also reports multiple falls the past 2 days.

## 2021-07-03 ENCOUNTER — Inpatient Hospital Stay (HOSPITAL_COMMUNITY): Payer: Medicare Other

## 2021-07-03 ENCOUNTER — Encounter (HOSPITAL_COMMUNITY): Payer: Self-pay | Admitting: Internal Medicine

## 2021-07-03 ENCOUNTER — Emergency Department (HOSPITAL_COMMUNITY): Payer: Medicare Other

## 2021-07-03 DIAGNOSIS — J90 Pleural effusion, not elsewhere classified: Secondary | ICD-10-CM | POA: Diagnosis present

## 2021-07-03 DIAGNOSIS — N400 Enlarged prostate without lower urinary tract symptoms: Secondary | ICD-10-CM | POA: Diagnosis present

## 2021-07-03 DIAGNOSIS — C3431 Malignant neoplasm of lower lobe, right bronchus or lung: Secondary | ICD-10-CM | POA: Diagnosis present

## 2021-07-03 DIAGNOSIS — G936 Cerebral edema: Secondary | ICD-10-CM | POA: Diagnosis present

## 2021-07-03 DIAGNOSIS — Z6824 Body mass index (BMI) 24.0-24.9, adult: Secondary | ICD-10-CM | POA: Diagnosis not present

## 2021-07-03 DIAGNOSIS — Z7984 Long term (current) use of oral hypoglycemic drugs: Secondary | ICD-10-CM | POA: Diagnosis not present

## 2021-07-03 DIAGNOSIS — E43 Unspecified severe protein-calorie malnutrition: Secondary | ICD-10-CM | POA: Diagnosis present

## 2021-07-03 DIAGNOSIS — N401 Enlarged prostate with lower urinary tract symptoms: Secondary | ICD-10-CM

## 2021-07-03 DIAGNOSIS — I10 Essential (primary) hypertension: Secondary | ICD-10-CM | POA: Diagnosis present

## 2021-07-03 DIAGNOSIS — C7931 Secondary malignant neoplasm of brain: Secondary | ICD-10-CM | POA: Diagnosis present

## 2021-07-03 DIAGNOSIS — D330 Benign neoplasm of brain, supratentorial: Secondary | ICD-10-CM

## 2021-07-03 DIAGNOSIS — R296 Repeated falls: Secondary | ICD-10-CM | POA: Diagnosis present

## 2021-07-03 DIAGNOSIS — R350 Frequency of micturition: Secondary | ICD-10-CM

## 2021-07-03 DIAGNOSIS — D72829 Elevated white blood cell count, unspecified: Secondary | ICD-10-CM

## 2021-07-03 DIAGNOSIS — J432 Centrilobular emphysema: Secondary | ICD-10-CM | POA: Diagnosis present

## 2021-07-03 DIAGNOSIS — D496 Neoplasm of unspecified behavior of brain: Secondary | ICD-10-CM | POA: Diagnosis present

## 2021-07-03 DIAGNOSIS — D499 Neoplasm of unspecified behavior of unspecified site: Secondary | ICD-10-CM

## 2021-07-03 DIAGNOSIS — R531 Weakness: Secondary | ICD-10-CM

## 2021-07-03 DIAGNOSIS — E119 Type 2 diabetes mellitus without complications: Secondary | ICD-10-CM | POA: Diagnosis present

## 2021-07-03 DIAGNOSIS — Z20822 Contact with and (suspected) exposure to covid-19: Secondary | ICD-10-CM | POA: Diagnosis present

## 2021-07-03 DIAGNOSIS — E1169 Type 2 diabetes mellitus with other specified complication: Secondary | ICD-10-CM

## 2021-07-03 DIAGNOSIS — M21371 Foot drop, right foot: Secondary | ICD-10-CM | POA: Diagnosis present

## 2021-07-03 DIAGNOSIS — Z96611 Presence of right artificial shoulder joint: Secondary | ICD-10-CM | POA: Diagnosis present

## 2021-07-03 DIAGNOSIS — Z7982 Long term (current) use of aspirin: Secondary | ICD-10-CM | POA: Diagnosis not present

## 2021-07-03 DIAGNOSIS — Z794 Long term (current) use of insulin: Secondary | ICD-10-CM

## 2021-07-03 DIAGNOSIS — Z720 Tobacco use: Secondary | ICD-10-CM | POA: Diagnosis present

## 2021-07-03 DIAGNOSIS — C3491 Malignant neoplasm of unspecified part of right bronchus or lung: Secondary | ICD-10-CM

## 2021-07-03 DIAGNOSIS — Z902 Acquired absence of lung [part of]: Secondary | ICD-10-CM | POA: Diagnosis not present

## 2021-07-03 DIAGNOSIS — G8194 Hemiplegia, unspecified affecting left nondominant side: Secondary | ICD-10-CM | POA: Diagnosis present

## 2021-07-03 DIAGNOSIS — Z87891 Personal history of nicotine dependence: Secondary | ICD-10-CM | POA: Diagnosis not present

## 2021-07-03 DIAGNOSIS — Z7951 Long term (current) use of inhaled steroids: Secondary | ICD-10-CM | POA: Diagnosis not present

## 2021-07-03 LAB — URINALYSIS, ROUTINE W REFLEX MICROSCOPIC
Bacteria, UA: NONE SEEN
Bilirubin Urine: NEGATIVE
Glucose, UA: NEGATIVE mg/dL
Hgb urine dipstick: NEGATIVE
Ketones, ur: NEGATIVE mg/dL
Nitrite: NEGATIVE
Protein, ur: NEGATIVE mg/dL
Specific Gravity, Urine: 1.023 (ref 1.005–1.030)
pH: 5 (ref 5.0–8.0)

## 2021-07-03 LAB — CBG MONITORING, ED
Glucose-Capillary: 152 mg/dL — ABNORMAL HIGH (ref 70–99)
Glucose-Capillary: 155 mg/dL — ABNORMAL HIGH (ref 70–99)

## 2021-07-03 LAB — RAPID URINE DRUG SCREEN, HOSP PERFORMED
Amphetamines: NOT DETECTED
Barbiturates: NOT DETECTED
Benzodiazepines: NOT DETECTED
Cocaine: NOT DETECTED
Opiates: NOT DETECTED
Tetrahydrocannabinol: POSITIVE — AB

## 2021-07-03 LAB — GLUCOSE, CAPILLARY: Glucose-Capillary: 211 mg/dL — ABNORMAL HIGH (ref 70–99)

## 2021-07-03 IMAGING — CT CT CHEST-ABD-PELV W/ CM
2 of 5 series · 13 of 36 positions shown, 15 images · IV contrast (APPLIED)
Comparison: None.

CLINICAL DATA: Brain metastatic disease, status post right lower
lobectomy

EXAM:
CT CHEST, ABDOMEN, AND PELVIS WITH CONTRAST
TECHNIQUE: Multidetector CT imaging of the chest, abdomen and pelvis was
performed following the standard protocol during bolus
administration of intravenous contrast.

[Series 3: cap 5.0 i31f 2 · axial · 0.87mm/px · z∈[+1242,+1827]mm · 10 of 143 slices shown, 12 images]
[im 13/143  mediastinal]
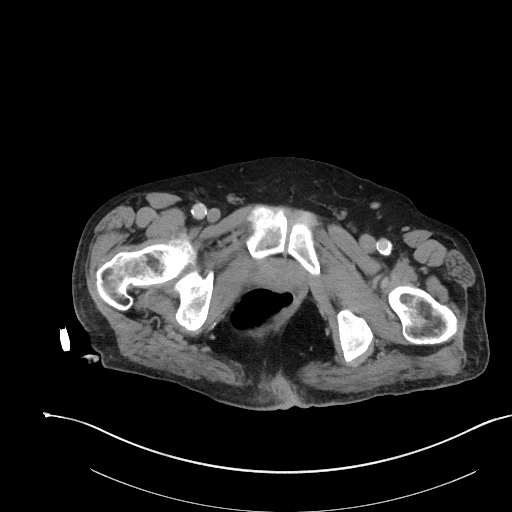
[im 13/143  bone]
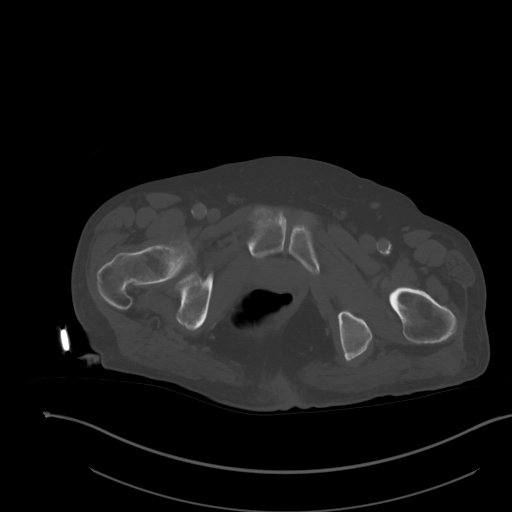
[im 26/143  mediastinal]
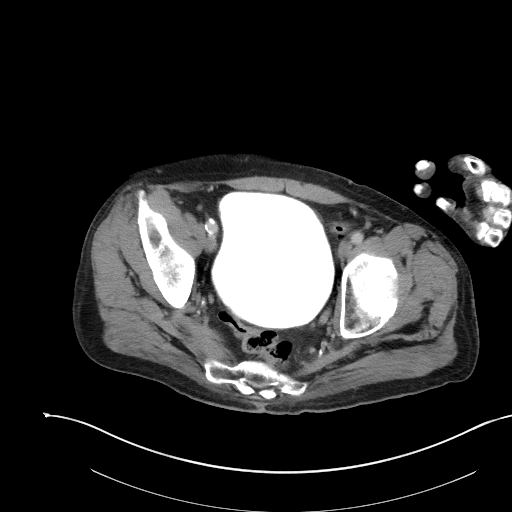
[im 39/143  mediastinal]
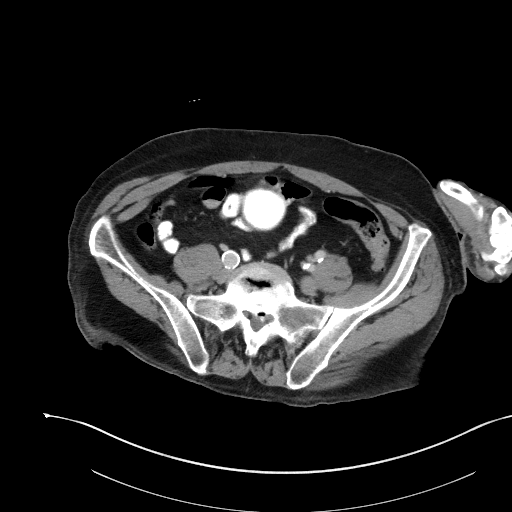
[im 52/143  mediastinal]
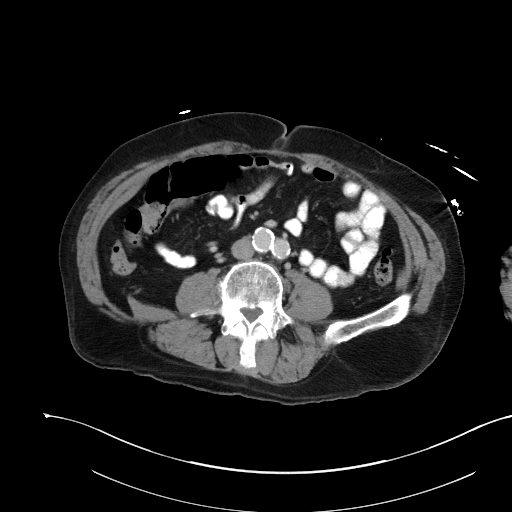
[im 65/143  mediastinal]
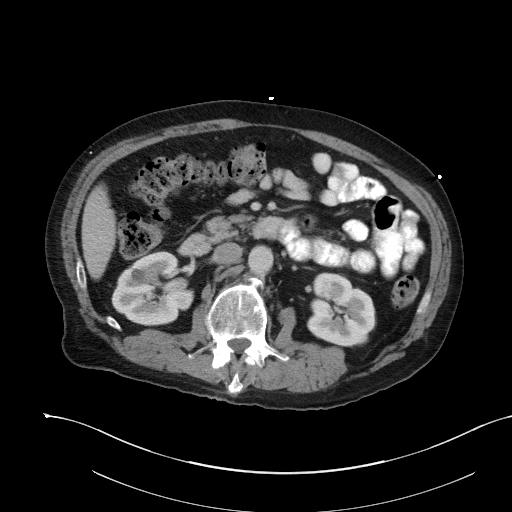
[im 78/143  mediastinal]
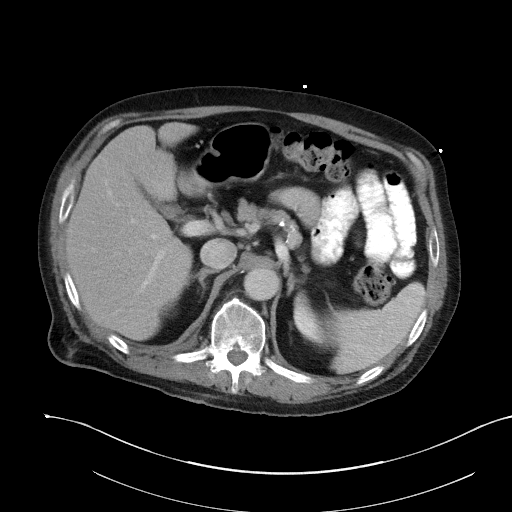
[im 91/143  mediastinal]
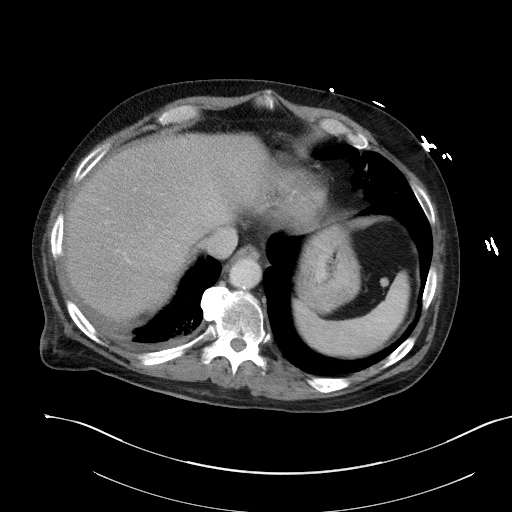
[im 104/143  mediastinal]
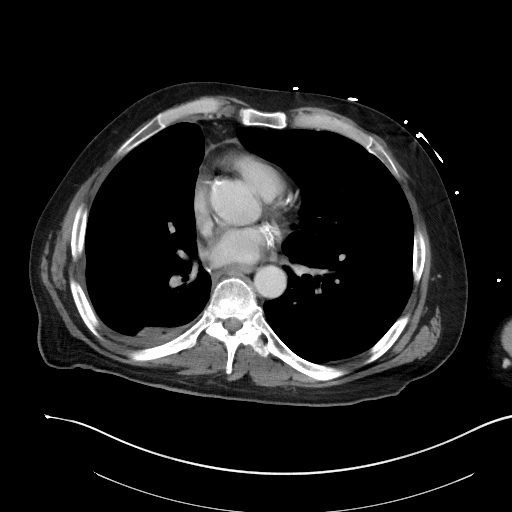
[im 117/143  mediastinal]
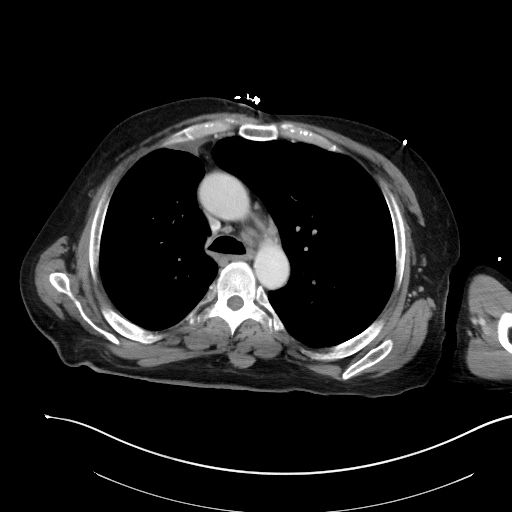
[im 117/143  bone]
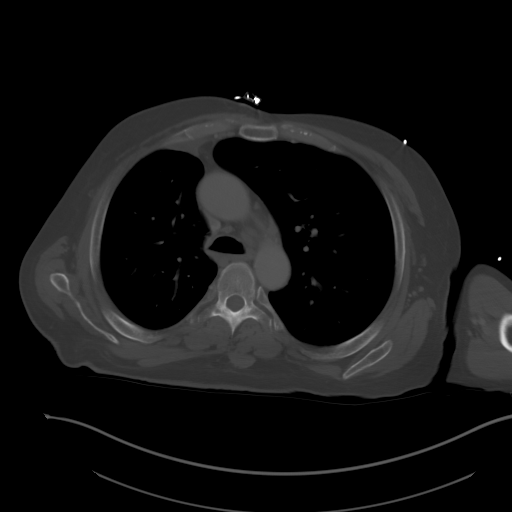
[im 130/143  mediastinal]
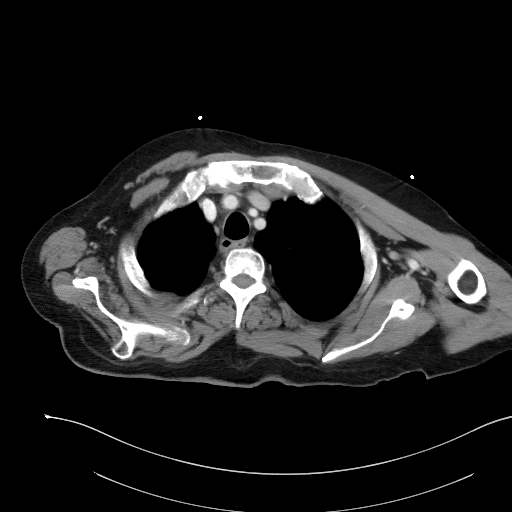

[Series 6: coronal · coronal · 0.88mm/px · 3 of 141 slices shown]
[im 29/141  mediastinal]
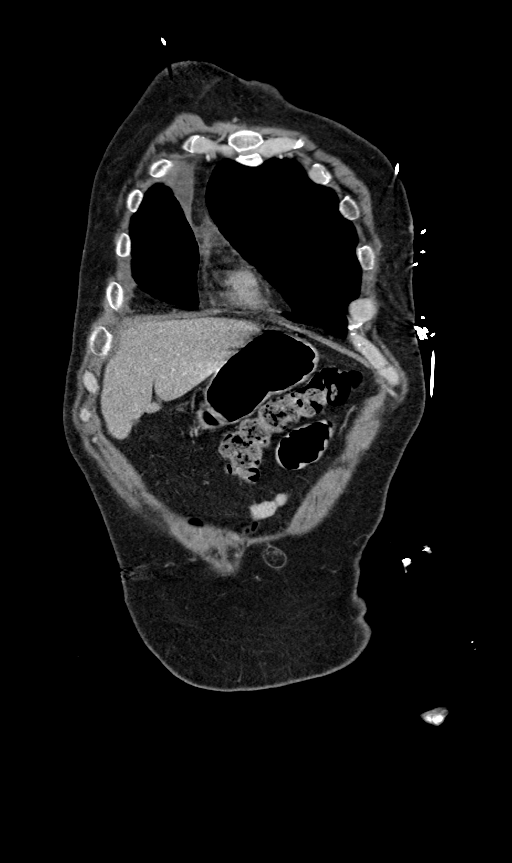
[im 57/141  mediastinal]
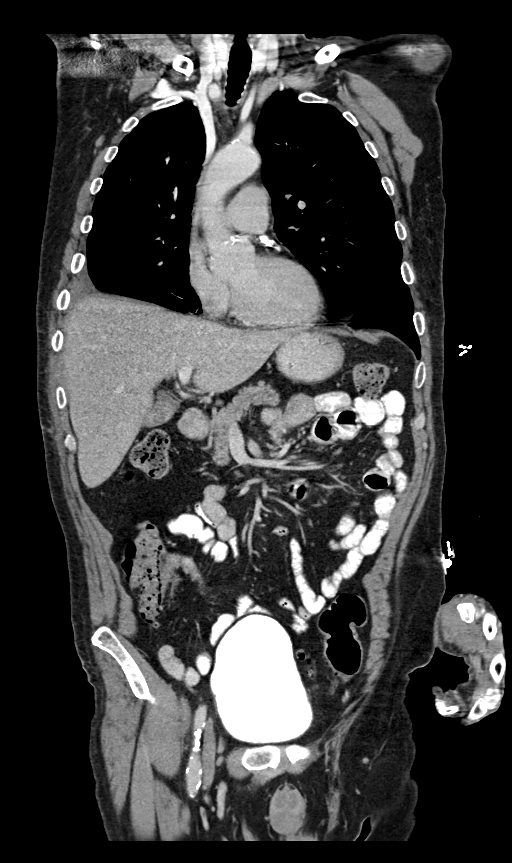
[im 85/141  mediastinal]
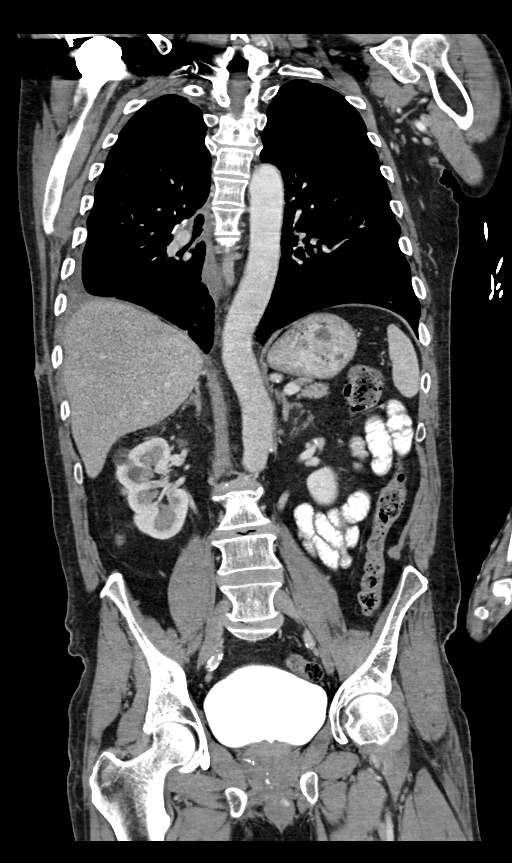

[13 of 36 positions shown; findings below may reference images not displayed]

RADIATION DOSE REDUCTION: This exam was performed according to the
departmental dose-optimization program which includes automated
exposure control, adjustment of the mA and/or kV according to
patient size and/or use of iterative reconstruction technique.

CONTRAST:  100mL OMNIPAQUE IOHEXOL 300 MG/ML SOLN, additional oral
enteric contrast
FINDINGS: CT CHEST FINDINGS

Cardiovascular: Aortic atherosclerosis. Normal heart size.
Three-vessel coronary artery calcifications and stents. No
pericardial effusion.

Mediastinum/Nodes: No enlarged mediastinal, hilar, or axillary lymph
nodes. Thyroid gland, trachea, and esophagus demonstrate no
significant findings.

Lungs/Pleura: Status post right lower lobectomy. Trace, loculated
right pleural effusion, with a small, loculated air component
superiorly and dependently (series 5, image 86). Moderate
centrilobular emphysema. Clustered centrilobular and tree-in-bud
ground-glass opacities in the bilateral lung bases (series 5, image
126). No pleural effusion or pneumothorax.

Musculoskeletal: No chest wall mass or suspicious osseous lesions
identified.

CT ABDOMEN PELVIS FINDINGS

Hepatobiliary: No solid liver abnormality is seen. Faintly rim
calcified gallstone in the gallbladder. No gallbladder wall
thickening, or biliary dilatation.

Pancreas: Unremarkable. No pancreatic ductal dilatation or
surrounding inflammatory changes.

Spleen: Normal in size without significant abnormality.

Adrenals/Urinary Tract: Adrenal glands are unremarkable. Kidneys are
normal, without renal calculi, solid lesion, or hydronephrosis.
Bladder is unremarkable.

Stomach/Bowel: Stomach is within normal limits. Appendix appears
normal. No evidence of bowel wall thickening, distention, or
inflammatory changes. Descending and sigmoid diverticulosis.

Vascular/Lymphatic: Aortic atherosclerosis. No enlarged abdominal or
pelvic lymph nodes.

Reproductive: No mass or other abnormality.

Other: No abdominal wall hernia or abnormality. No ascites.

Musculoskeletal: No acute osseous findings.
IMPRESSION: 1. Status post right lower lobectomy.
2. No evidence of lymphadenopathy or metastatic disease in the
chest, abdomen, or pelvis. Please note, that in order to ensure
subspecialty interpretation, restaging examinations for known
malignancies should generally be deferred to the nonemergent,
outpatient setting.
3. Clustered centrilobular and tree-in-bud ground-glass opacities in
the bilateral lung bases. Findings suggest atypical infection or
aspiration.
4. Cholelithiasis.
5. Coronary artery disease.

Aortic Atherosclerosis ([TW]-[TW]) and Emphysema ([TW]-[TW]).

## 2021-07-03 IMAGING — DX DG CHEST 1V PORT
1 series · 1 of 1 positions shown · non-contrast
Comparison: [DATE].

CLINICAL DATA: Left arm and leg numbness.  Multiple falls.

EXAM:
PORTABLE CHEST 1 VIEW

[chest]
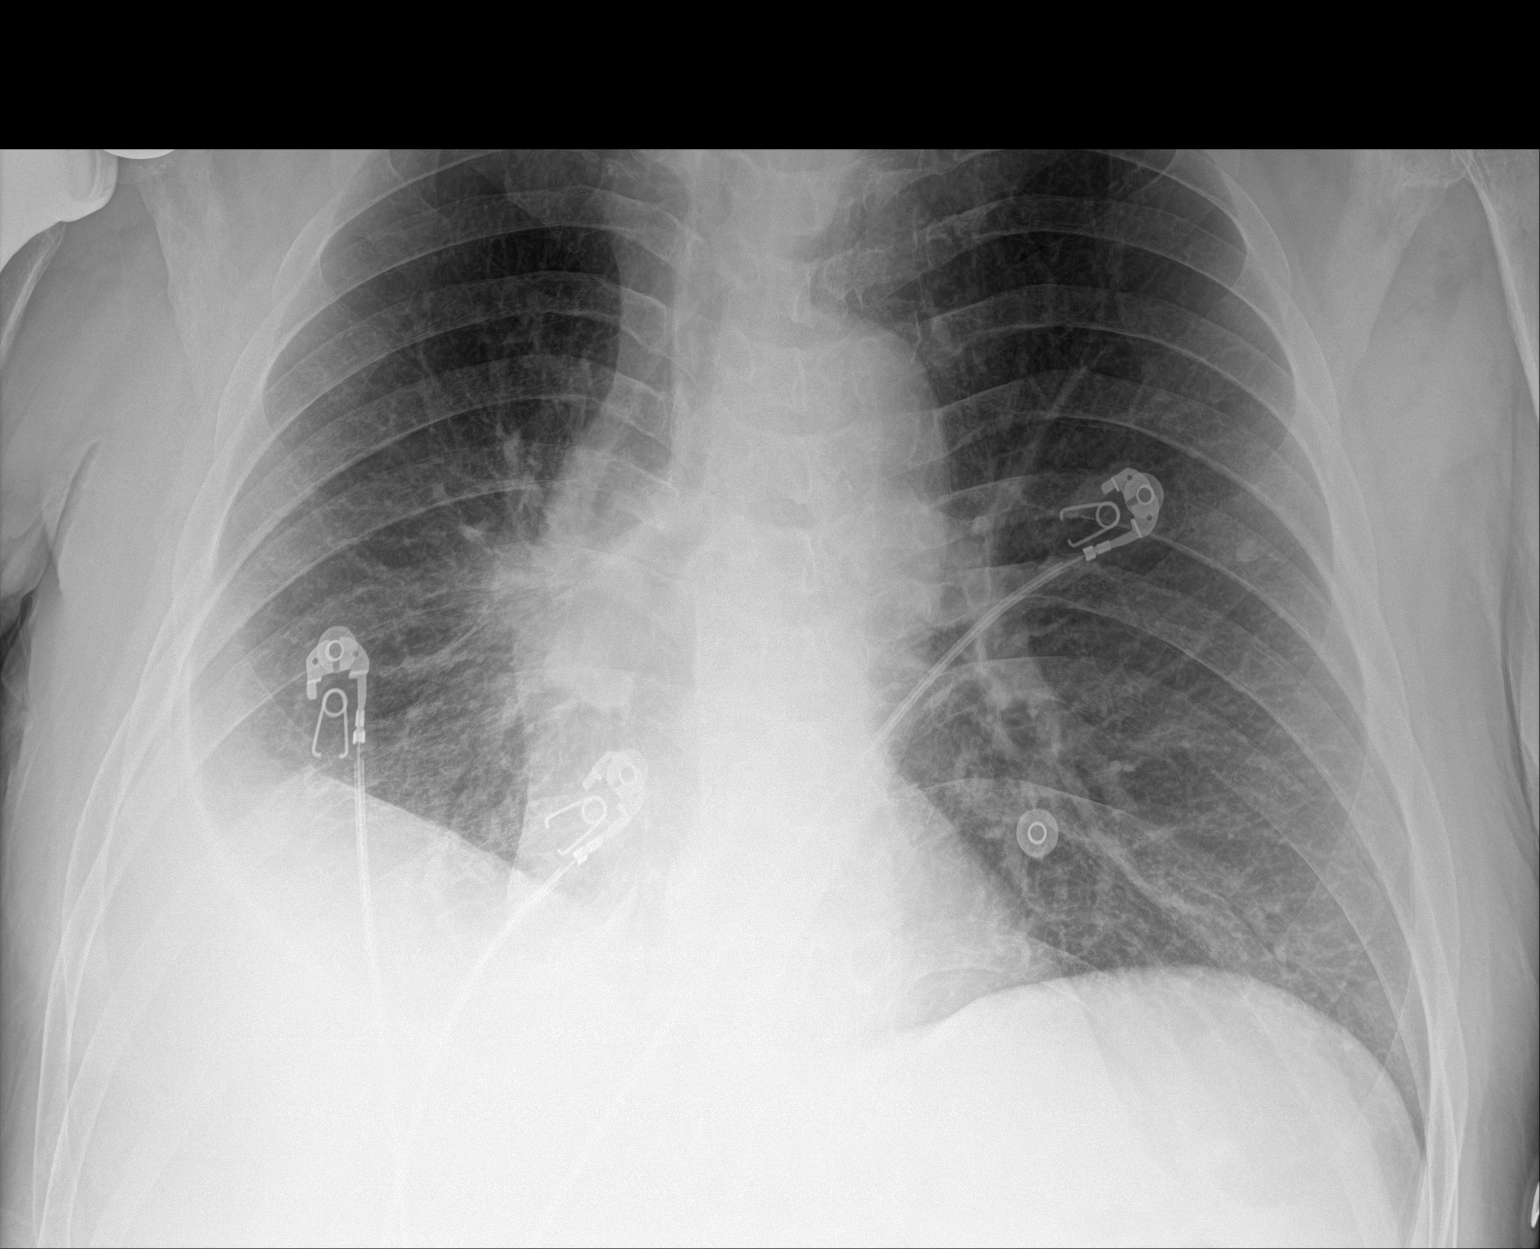

[1 of 1 positions shown; findings below may reference images not displayed]

FINDINGS: The heart size and mediastinal contours are within normal limits.
Atherosclerotic calcification of the aorta is noted. There is a
persistent small to moderate right pleural effusion with mild
atelectasis at the right lung base. The left lung is clear. No
pneumothorax. Shoulder arthroplasty changes are noted on the right.
IMPRESSION: Persistent small to moderate right pleural effusion, not
significantly changed from the prior exam.

## 2021-07-03 IMAGING — CT CT HEAD WO/W CM
5 of 8 series · 16 of 47 positions shown, 17 images · IV contrast (agent unspecified)
Comparison: PET-CT [DATE].

CLINICAL DATA: 65-year-old male with multiple falls. Neurologic
deficit. Left extremity numbness for 2 days. Cavitary right lung
lesion suspicious for lung cancer in [REDACTED].

EXAM:
CT HEAD WITHOUT AND WITH CONTRAST
TECHNIQUE: Contiguous axial images were obtained from the base of the skull
through the vertex without and with intravenous contrast.

[Series 3: head wo · axial · 0.43mm/px · z∈[-78,-22]mm · 2 of 34 slices shown, 3 images]
[im 12/34  brain]
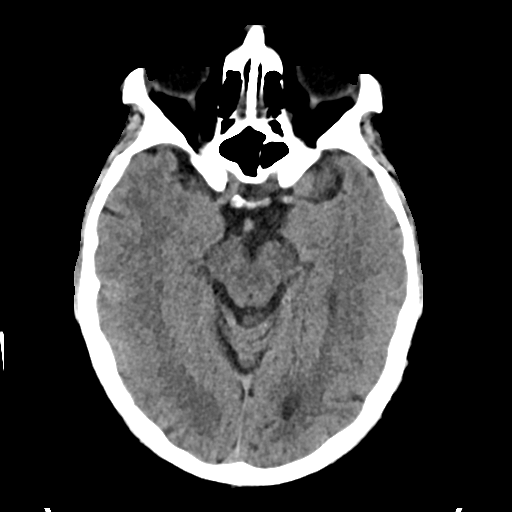
[im 12/34  bone]
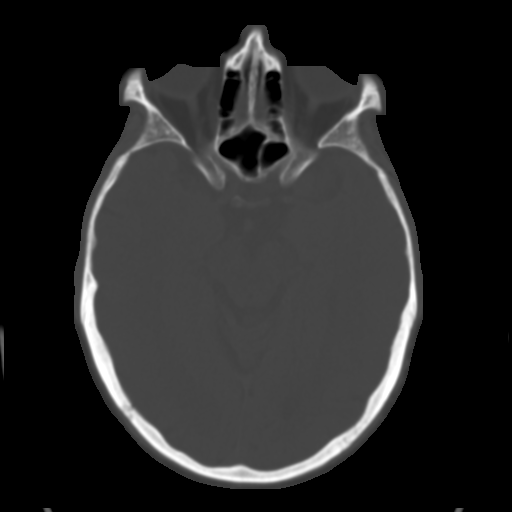
[im 23/34  brain]
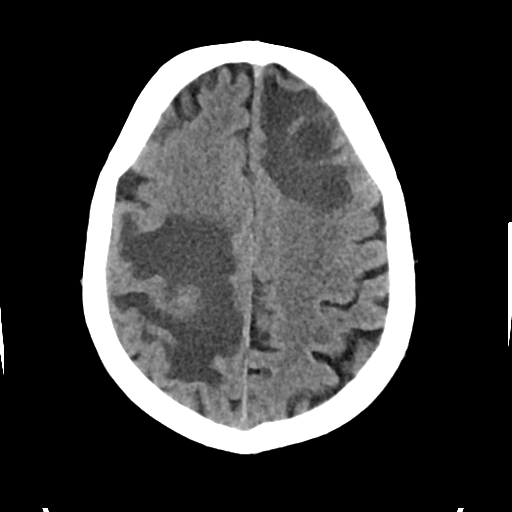

[Series 4: head bone · axial · 0.43mm/px · z∈[-116,-0]mm · 7 of 84 slices shown]
[im 9/84  bone]
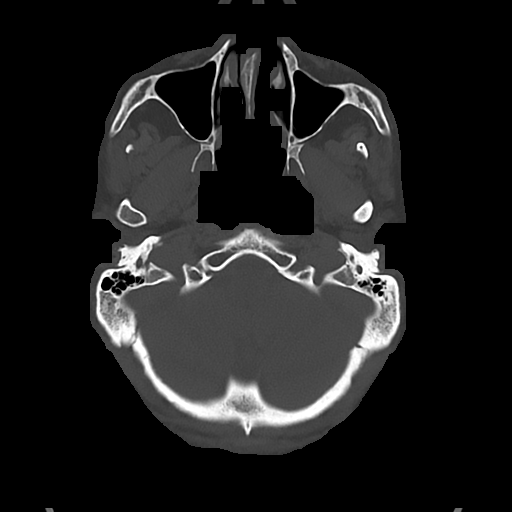
[im 17/84  bone]
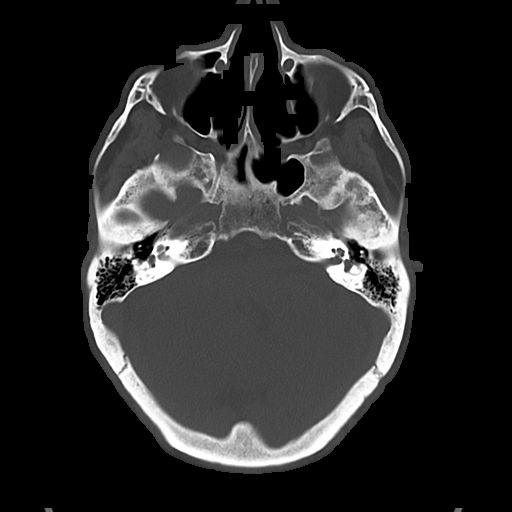
[im 25/84  bone]
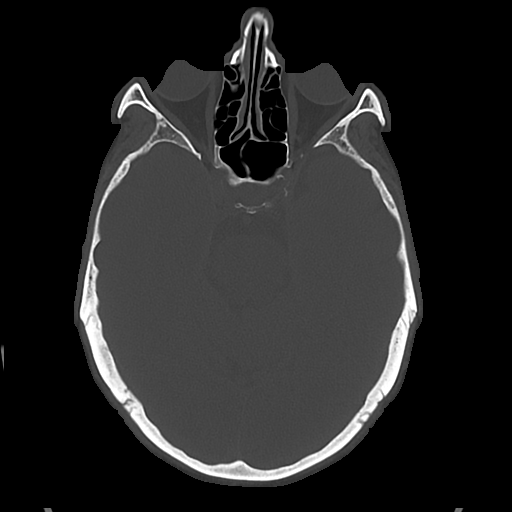
[im 34/84  bone]
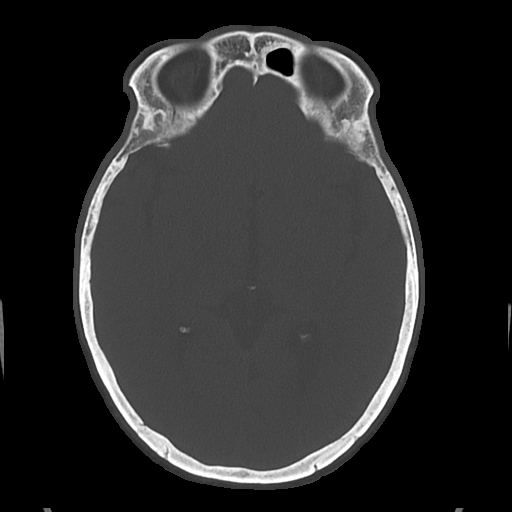
[im 50/84  bone]
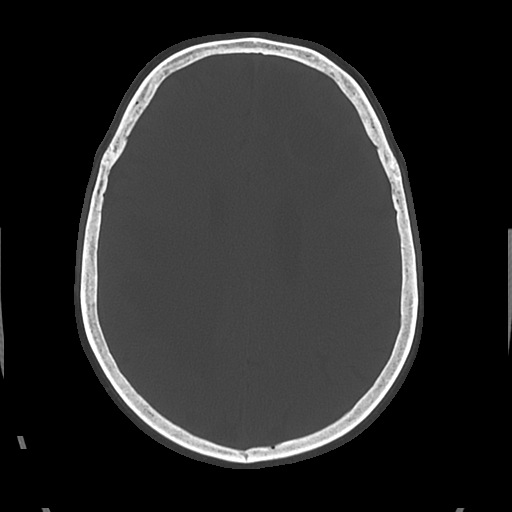
[im 59/84  bone]
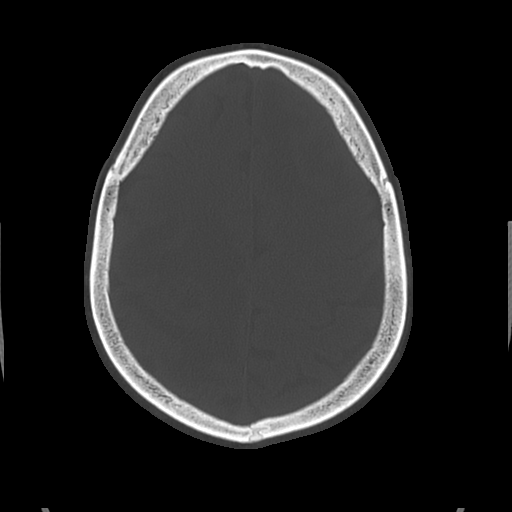
[im 67/84  bone]
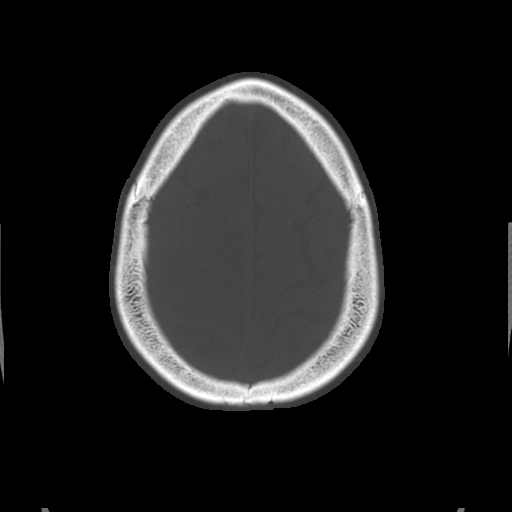

[Series 5: cor soft · coronal · 0.32mm/px · 3 of 76 slices shown]
[im 19/76  brain]
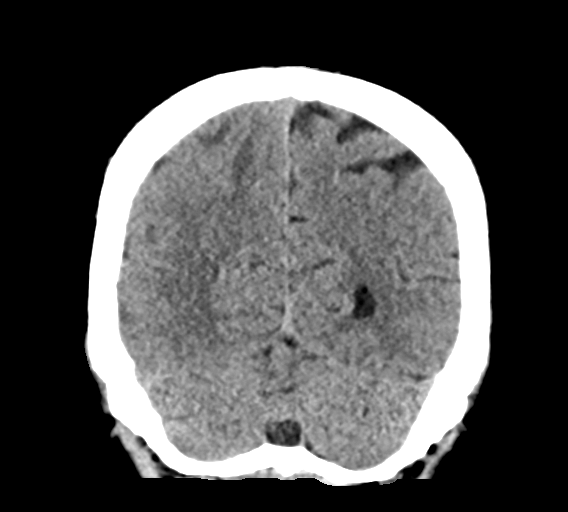
[im 38/76  brain]
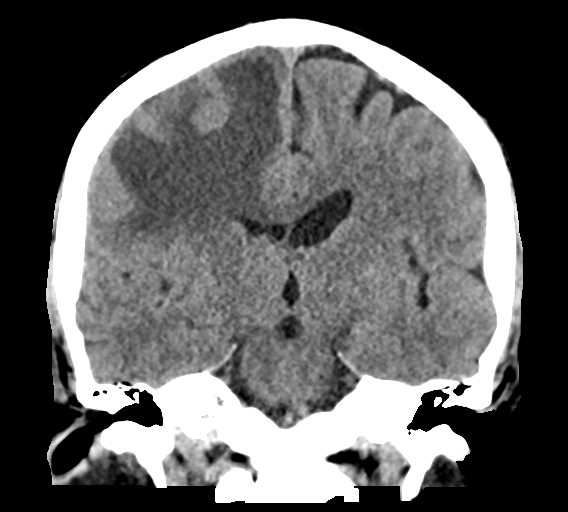
[im 57/76  brain]
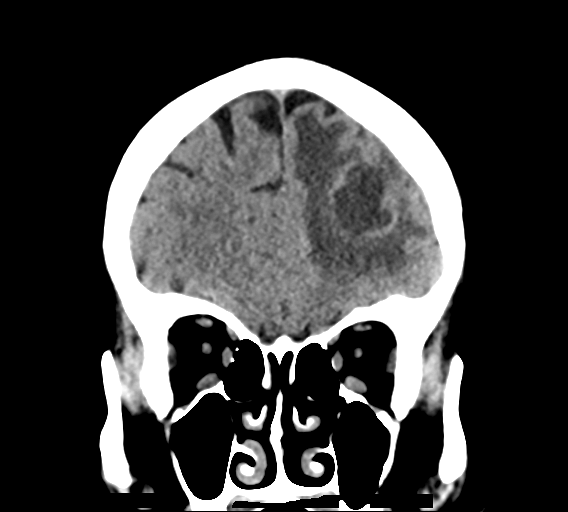

[Series 7: head with · axial · 0.44mm/px · z∈[-78,-22]mm · 2 of 34 slices shown]
[im 12/34  brain]
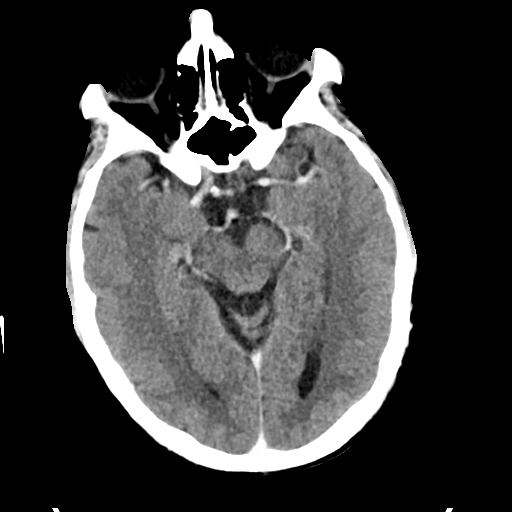
[im 23/34  brain]
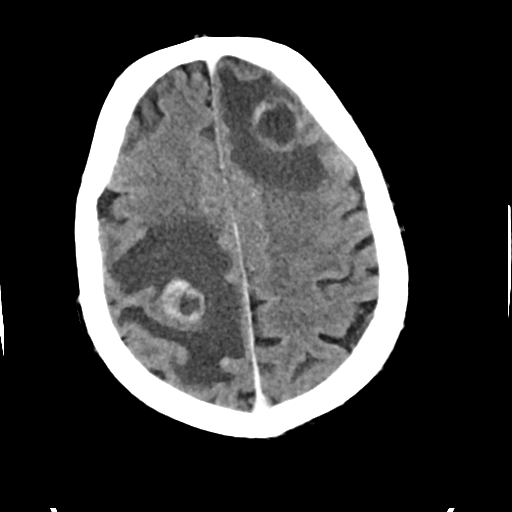

[Series 10: sag soft · sagittal · 0.32mm/px · 2 of 62 slices shown]
[im 21/62  brain]
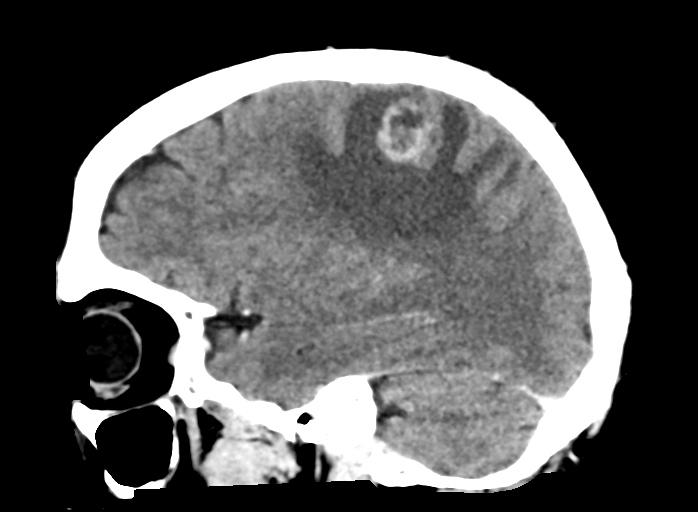
[im 41/62  brain]
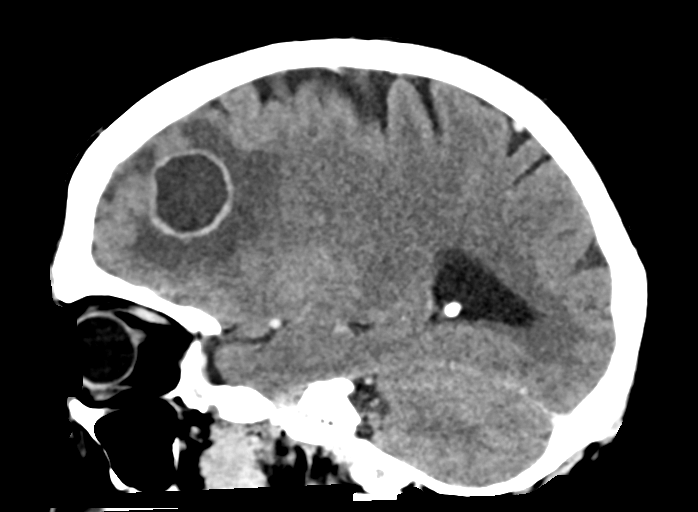

[16 of 47 positions shown; findings below may reference images not displayed]

RADIATION DOSE REDUCTION: This exam was performed according to the
departmental dose-optimization program which includes automated
exposure control, adjustment of the mA and/or kV according to
patient size and/or use of iterative reconstruction technique.

CONTRAST:  75mL OMNIPAQUE IOHEXOL 300 MG/ML  SOLN
FINDINGS: Brain: Multifocal cerebral vasogenic edema. Anterior left frontal
lobe 3 cm rim enhancing mass (series 7, image 21) with regional
vasogenic edema.

Posterior right superior frontal gyrus 2.6 cm rim enhancing mass
with regional vasogenic edema. This lesion is perirolandic, with
enhancement and edema subsequently affecting the left extremity
motor and sensory areas.

Mass effect on the lateral ventricles, but no midline shift. Basilar
cisterns remain patent. No ventriculomegaly.

No other No abnormal enhancement identified. No acute intracranial
hemorrhage identified. No cortically based acute infarct identified.

Vascular: Mild Calcified atherosclerosis at the skull base. Major
intracranial vascular structures are enhancing as expected and
appear to be patent.

Skull: No acute or suspicious osseous lesion identified.

Sinuses/Orbits: Visualized paranasal sinuses and mastoids are clear.

Other: Postoperative changes to both globes. No acute orbit or scalp
soft tissue finding.
IMPRESSION: 1. Positive for Metastatic Disease to the brain.
Two rim enhancing masses, up to 3 cm diameter, with vasogenic edema.
Superior Right Perirolandic Cortex and anterior left frontal lobe
affected.
Mass effect on the lateral ventricles but no midline shift and
basilar cisterns remain normal.

2. Brain MRI without and with contrast might detect additional
lesions.

## 2021-07-03 IMAGING — MR MR HEAD WO/W CM
8 of 13 series · 29 of 48 positions shown · IV contrast (gadavist)
Comparison: CT head [DATE]

CLINICAL DATA: Rule out metastatic disease. Squamous cell carcinoma
lung. Abnormal head CT.

EXAM:
MRI HEAD WITHOUT AND WITH CONTRAST
TECHNIQUE: Multiplanar, multiecho pulse sequences of the brain and surrounding
structures were obtained without and with intravenous contrast.
CONTRAST:  8.5mL GADAVIST GADOBUTROL 1 MMOL/ML IV SOLN

[Series 2: DWI · axial · 3.0mm · 0.94mm/px · z∈[-70,+89]mm · 9 of 108 slices shown (1 of 2)]
[im 1/108]
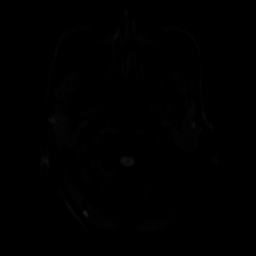
[im 14/108]
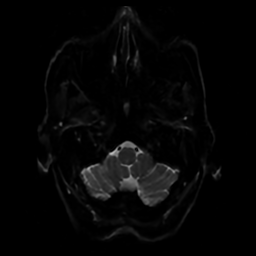
[im 27/108]
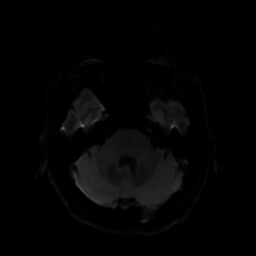
[im 41/108]
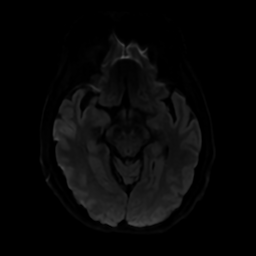
[im 54/108]
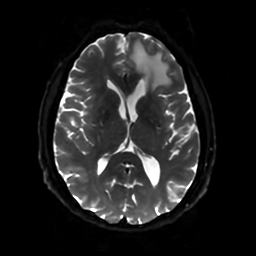
[im 67/108]
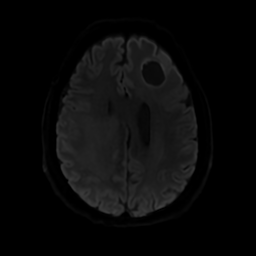
[im 81/108]
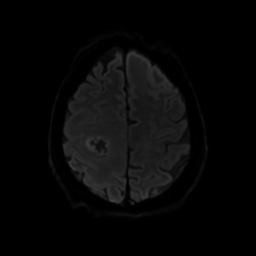
[im 94/108]
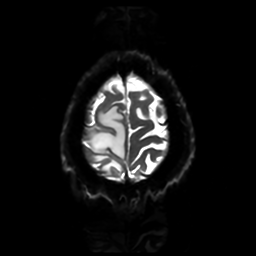
[im 108/108]
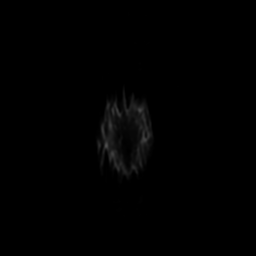

[Series 3: DWI · coronal · 4.0mm · 0.94mm/px · 6 of 80 slices shown (2 of 2)]
[im 1/80]
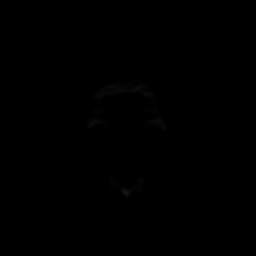
[im 16/80]
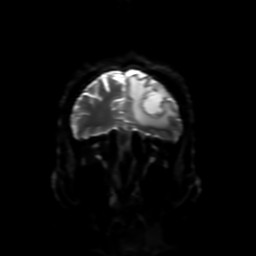
[im 32/80]
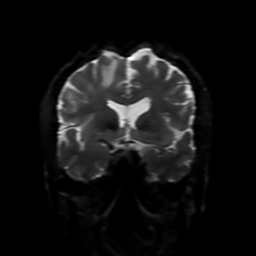
[im 48/80]
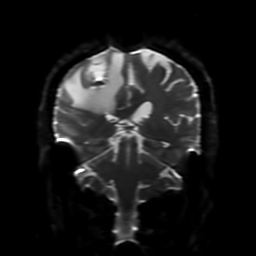
[im 64/80]
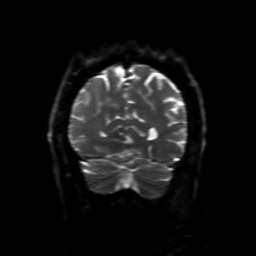
[im 80/80]
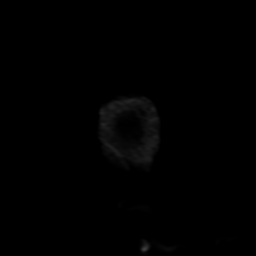

[Series 4: FLAIR · sagittal · 5.0mm · 0.23mm/px · 2 of 25 slices shown (1 of 2)]
[im 1/25]
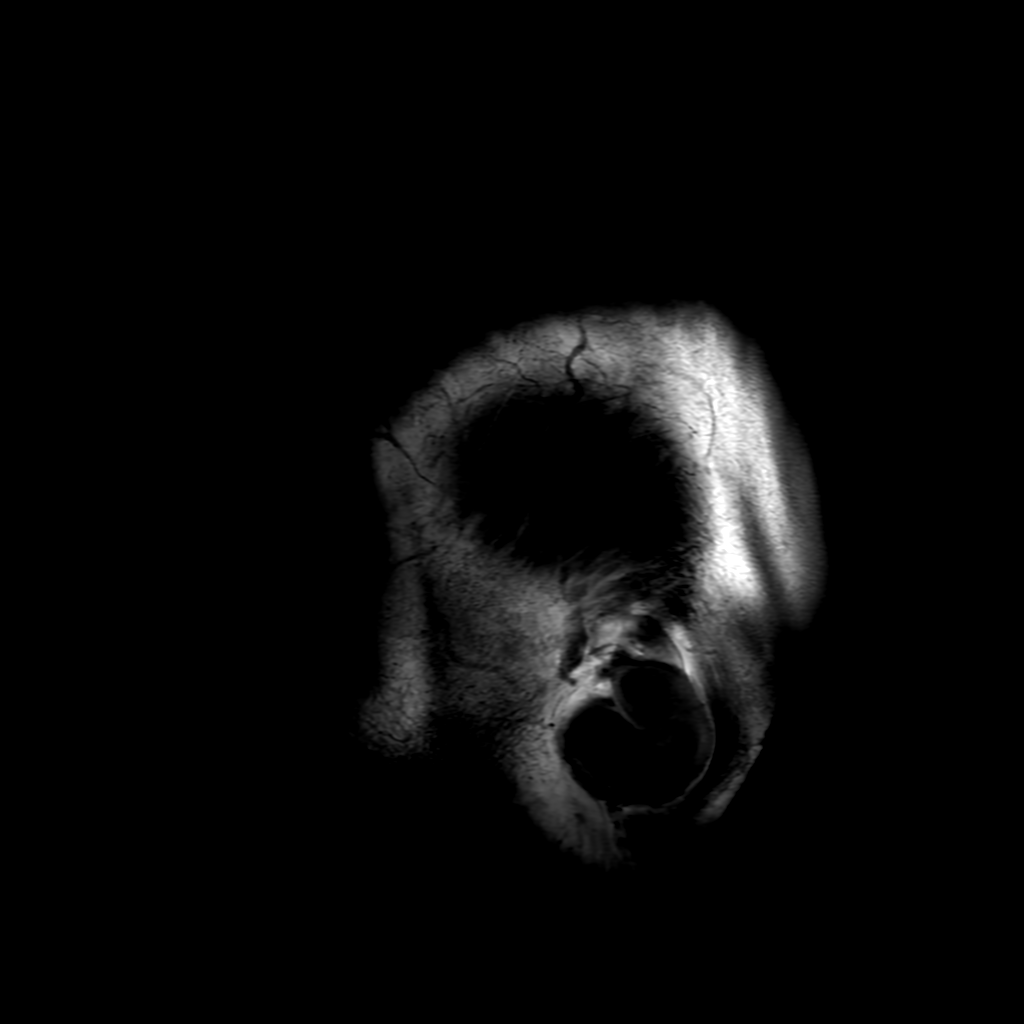
[im 25/25]
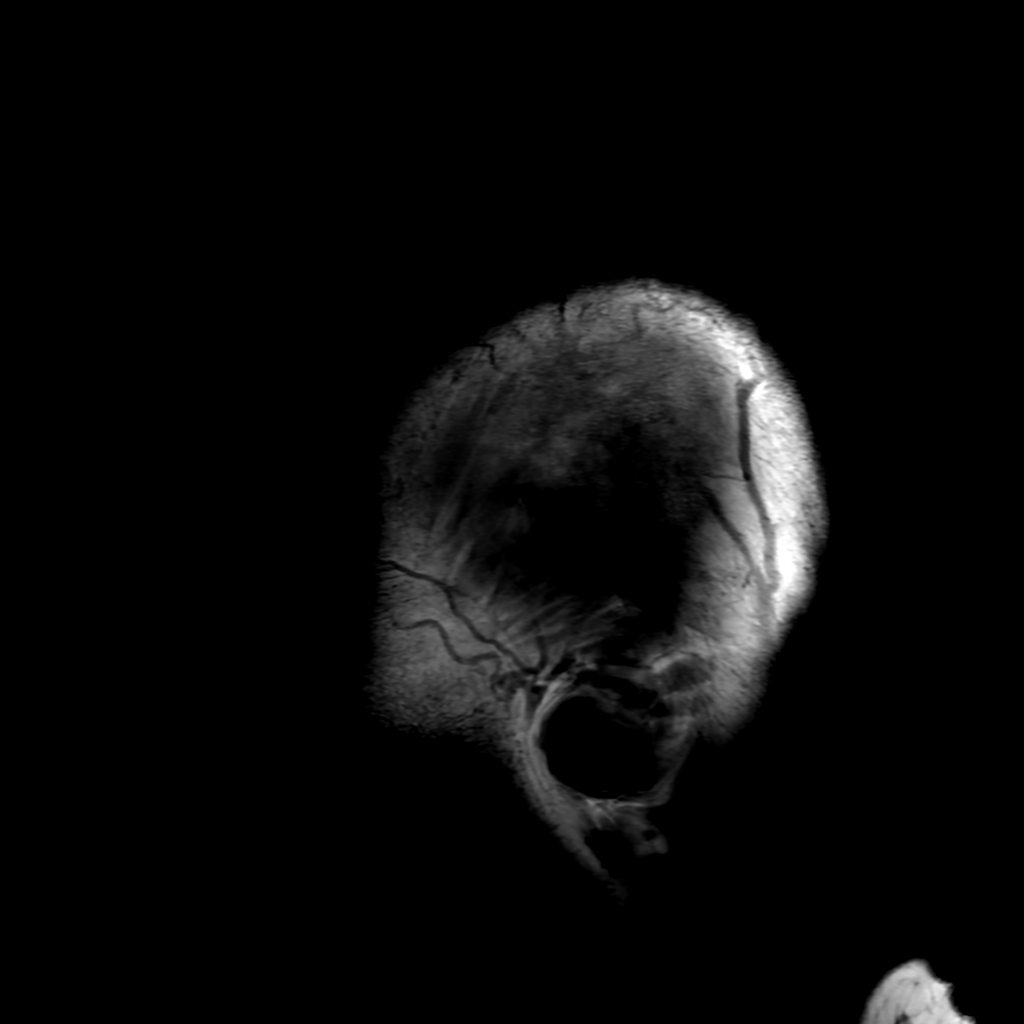

[Series 6: FLAIR · axial · 5.0mm · 0.47mm/px · z∈[-69,+87]mm · 2 of 27 slices shown (2 of 2)]
[im 1/27]
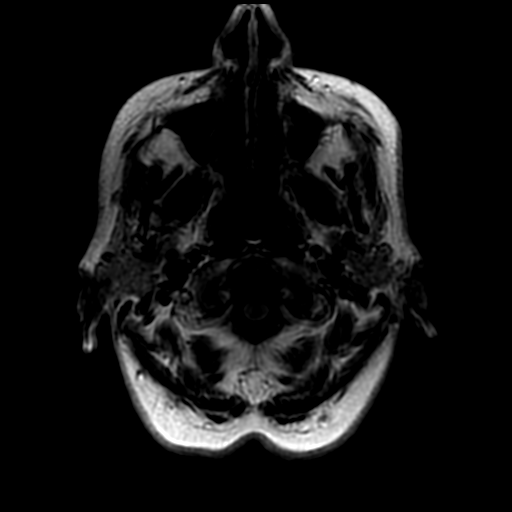
[im 27/27]
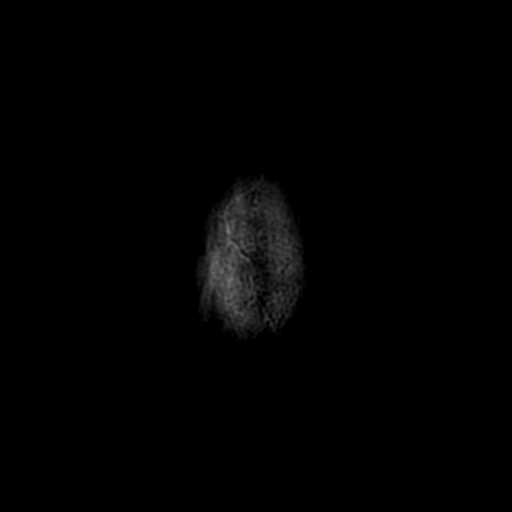

[Series 11: T1 post-contrast · axial · 5.0mm · 0.47mm/px · 1 of 27 slices shown]
[im 1/27]
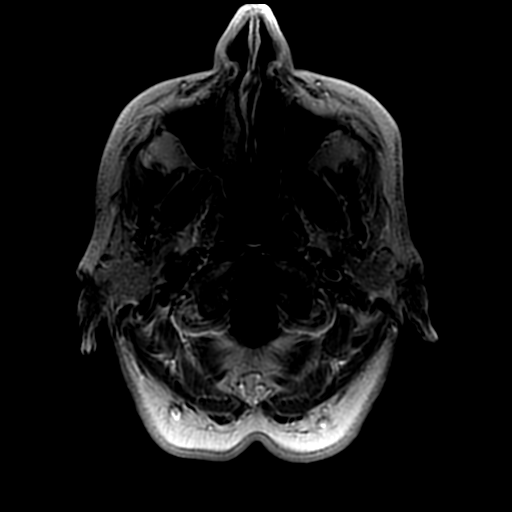

[Series 13: FLAIR post-contrast · sagittal · 5.0mm · 0.47mm/px · 2 of 25 slices shown]
[im 1/25]
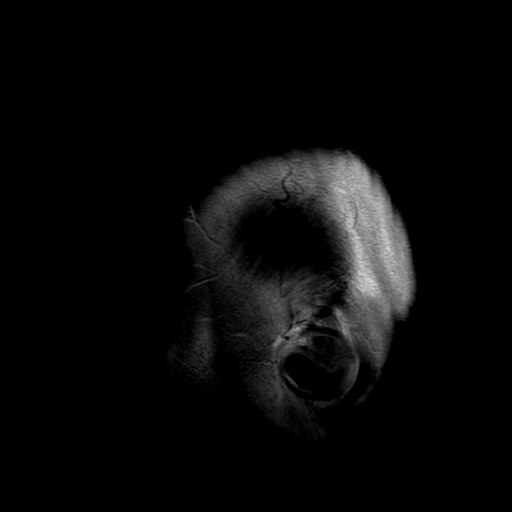
[im 25/25]
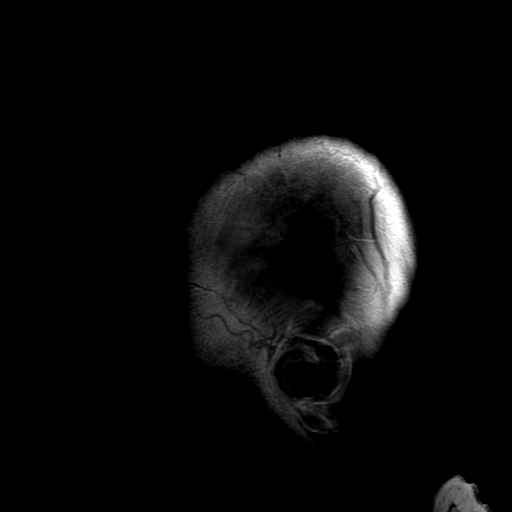

[Series 250: ADC · axial · 3.0mm · 0.94mm/px · z∈[-70,+89]mm · 4 of 53 slices shown (1 of 2)]
[im 1/53]
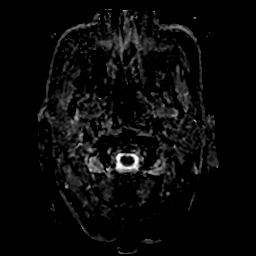
[im 18/53]
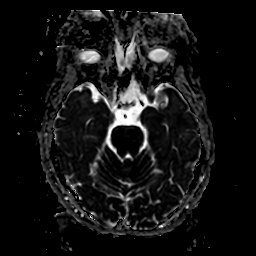
[im 35/53]
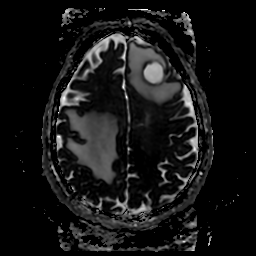
[im 53/53]
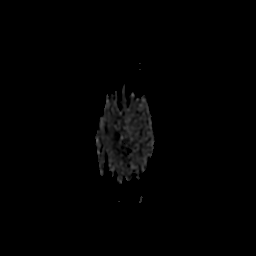

[Series 350: ADC · coronal · 4.0mm · 0.94mm/px · 3 of 40 slices shown (2 of 2)]
[im 1/40]
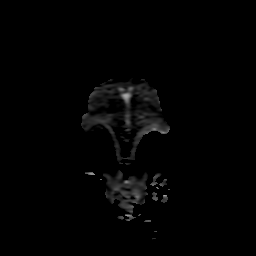
[im 20/40]
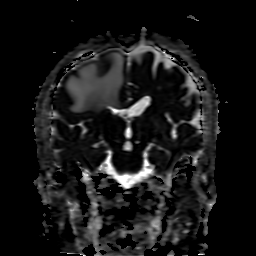
[im 40/40]
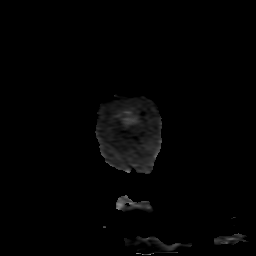

[29 of 48 positions shown; findings below may reference images not displayed]

FINDINGS: Brain: 3 enhancing lesions in the brain are present compatible with
metastatic disease.

2.8 cm left frontal mass with peripheral enhancement and central
necrosis. Minimal associated hemorrhage. Moderate surrounding edema
with mild midline shift to the right.

2.3 cm lesion in the precentral gyrus on the right. There is central
necrosis with internal hemorrhage. Moderate white matter edema and
local mass effect.

3 mm enhancing lesion left precentral gyrus without significant
edema compatible with metastatic disease.

Ventricle size normal.  Negative for acute infarct.

Vascular: Normal arterial flow voids

Skull and upper cervical spine: Negative

Sinuses/Orbits: Paranasal sinuses clear. Bilateral cataract
extraction

Other: None
IMPRESSION: Findings compatible with metastatic disease to brain. Three lesions
are identified. Left frontal lesion and right precentral gyrus
lesion show significant edema and local mass effect. 3 mm lesion
left precentral gyrus.

## 2021-07-03 MED ORDER — IOHEXOL 300 MG/ML  SOLN
100.0000 mL | Freq: Once | INTRAMUSCULAR | Status: AC | PRN
  Administered 2021-07-03: 100 mL via INTRAVENOUS

## 2021-07-03 MED ORDER — ALBUTEROL SULFATE (2.5 MG/3ML) 0.083% IN NEBU
2.5000 mg | INHALATION_SOLUTION | Freq: Four times a day (QID) | RESPIRATORY_TRACT | Status: DC | PRN
  Administered 2021-07-03 – 2021-07-04 (×4): 2.5 mg via RESPIRATORY_TRACT
  Filled 2021-07-03 (×4): qty 3

## 2021-07-03 MED ORDER — INSULIN ASPART 100 UNIT/ML IJ SOLN
0.0000 [IU] | Freq: Three times a day (TID) | INTRAMUSCULAR | Status: DC
  Administered 2021-07-03: 2 [IU] via SUBCUTANEOUS
  Administered 2021-07-03: 3 [IU] via SUBCUTANEOUS
  Administered 2021-07-03 – 2021-07-04 (×2): 2 [IU] via SUBCUTANEOUS
  Administered 2021-07-04: 3 [IU] via SUBCUTANEOUS
  Administered 2021-07-04 – 2021-07-05 (×3): 2 [IU] via SUBCUTANEOUS

## 2021-07-03 MED ORDER — DEXAMETHASONE 4 MG PO TABS: 4.0000 mg | ORAL_TABLET | Freq: Three times a day (TID) | ORAL | Status: DC

## 2021-07-03 MED ORDER — IOHEXOL 300 MG/ML  SOLN
75.0000 mL | Freq: Once | INTRAMUSCULAR | Status: AC | PRN
  Administered 2021-07-03: 75 mL via INTRAVENOUS

## 2021-07-03 MED ORDER — UMECLIDINIUM BROMIDE 62.5 MCG/ACT IN AEPB
1.0000 | INHALATION_SPRAY | Freq: Every day | RESPIRATORY_TRACT | Status: DC
  Administered 2021-07-05: 09:00:00 1 via RESPIRATORY_TRACT
  Filled 2021-07-03 (×2): qty 7

## 2021-07-03 MED ORDER — GADOBUTROL 1 MMOL/ML IV SOLN
8.5000 mL | Freq: Once | INTRAVENOUS | Status: AC | PRN
  Administered 2021-07-03: 8.5 mL via INTRAVENOUS

## 2021-07-03 MED ORDER — ONDANSETRON HCL 4 MG PO TABS: 4.0000 mg | ORAL_TABLET | Freq: Four times a day (QID) | ORAL | Status: DC | PRN

## 2021-07-03 MED ORDER — DEXAMETHASONE SODIUM PHOSPHATE 10 MG/ML IJ SOLN
10.0000 mg | Freq: Once | INTRAMUSCULAR | Status: AC
  Administered 2021-07-03: 10 mg via INTRAVENOUS
  Filled 2021-07-03: qty 1

## 2021-07-03 MED ORDER — ONDANSETRON HCL 4 MG/2ML IJ SOLN: 4.0000 mg | Freq: Four times a day (QID) | INTRAMUSCULAR | Status: DC | PRN

## 2021-07-03 MED ORDER — ACETAMINOPHEN 325 MG PO TABS: 650.0000 mg | ORAL_TABLET | Freq: Four times a day (QID) | ORAL | Status: DC | PRN

## 2021-07-03 MED ORDER — SODIUM CHLORIDE 0.9 % IV SOLN: INTRAVENOUS | Status: DC

## 2021-07-03 MED ORDER — ACETAMINOPHEN 650 MG RE SUPP: 650.0000 mg | Freq: Four times a day (QID) | RECTAL | Status: DC | PRN

## 2021-07-03 MED ORDER — SODIUM CHLORIDE 0.9 % IV SOLN: INTRAVENOUS | Status: AC

## 2021-07-03 MED ORDER — LORAZEPAM 2 MG/ML IJ SOLN
0.5000 mg | Freq: Once | INTRAMUSCULAR | Status: AC | PRN
  Administered 2021-07-03: 0.5 mg via INTRAVENOUS
  Filled 2021-07-03: qty 1

## 2021-07-03 MED ORDER — ENSURE ENLIVE PO LIQD: 237.0000 mL | Freq: Two times a day (BID) | ORAL | Status: DC

## 2021-07-03 MED ORDER — TAMSULOSIN HCL 0.4 MG PO CAPS
0.4000 mg | ORAL_CAPSULE | Freq: Every day | ORAL | Status: DC
  Administered 2021-07-03 – 2021-07-05 (×3): 0.4 mg via ORAL
  Filled 2021-07-03 (×3): qty 1

## 2021-07-03 MED ORDER — ENOXAPARIN SODIUM 40 MG/0.4ML IJ SOSY
40.0000 mg | PREFILLED_SYRINGE | INTRAMUSCULAR | Status: DC
  Administered 2021-07-03 – 2021-07-04 (×2): 40 mg via SUBCUTANEOUS
  Filled 2021-07-03 (×2): qty 0.4

## 2021-07-03 MED ORDER — METOPROLOL TARTRATE 5 MG/5ML IV SOLN: 5.0000 mg | Freq: Four times a day (QID) | INTRAVENOUS | Status: DC | PRN

## 2021-07-03 MED ORDER — SODIUM CHLORIDE 0.9% FLUSH
3.0000 mL | Freq: Two times a day (BID) | INTRAVENOUS | Status: DC
  Administered 2021-07-03 – 2021-07-04 (×3): 3 mL via INTRAVENOUS

## 2021-07-03 MED ORDER — DEXAMETHASONE 4 MG PO TABS
4.0000 mg | ORAL_TABLET | Freq: Four times a day (QID) | ORAL | Status: DC
  Administered 2021-07-03 – 2021-07-05 (×9): 4 mg via ORAL
  Filled 2021-07-03 (×9): qty 1

## 2021-07-03 MED ORDER — FLUTICASONE FUROATE-VILANTEROL 100-25 MCG/ACT IN AEPB
1.0000 | INHALATION_SPRAY | Freq: Every day | RESPIRATORY_TRACT | Status: DC
  Administered 2021-07-05: 09:00:00 1 via RESPIRATORY_TRACT
  Filled 2021-07-03 (×2): qty 28

## 2021-07-03 MED ORDER — LISINOPRIL 10 MG PO TABS
10.0000 mg | ORAL_TABLET | Freq: Every day | ORAL | Status: DC
  Administered 2021-07-03 – 2021-07-05 (×3): 10 mg via ORAL
  Filled 2021-07-03 (×3): qty 1

## 2021-07-03 MED ORDER — FLUTICASONE-UMECLIDIN-VILANT 100-62.5-25 MCG/ACT IN AEPB: 1.0000 | INHALATION_SPRAY | Freq: Every day | RESPIRATORY_TRACT | Status: DC

## 2021-07-03 MED ORDER — OXYCODONE HCL 5 MG PO TABS
5.0000 mg | ORAL_TABLET | ORAL | Status: DC | PRN
  Administered 2021-07-04: 5 mg via ORAL
  Filled 2021-07-03 (×2): qty 1

## 2021-07-03 MED ORDER — GLUCERNA SHAKE PO LIQD
237.0000 mL | Freq: Two times a day (BID) | ORAL | Status: DC
  Administered 2021-07-04 (×2): 237 mL via ORAL
  Filled 2021-07-03 (×2): qty 237

## 2021-07-03 NOTE — Consult Note (Signed)
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 male.  HPI: whom was newly diagnosed with invasive squamous cell lung CA, and underwent a partial lung resection in December 2022. On his first post op visit to Dr. Roxan Hockey 1/3/23he mentioned weakness in the left upper extremity. He also went to the [facility] ED on 06/24/21 complaining of weakness in the left hand and upper extremity. He started falling two days ago and presented to the [facility] ED with similar complaints. Head CT without contrast showed two lesions which are the etiology of his deficits.   Past Medical History:  Diagnosis Date   Arthritis    right shoulder   Cancer (HCC)    COPD (chronic obstructive pulmonary disease) (HCC)    Diabetes mellitus without complication (HCC)    Type II   Hypertension    Right foot drop    from pinched nerve in back, wears brace    Past Surgical History:  Procedure Laterality Date   CATARACT EXTRACTION Left    CATARACT EXTRACTION W/PHACO Right 08/13/2020   Procedure: CATARACT EXTRACTION PHACO AND INTRAOCULAR LENS PLACEMENT ([place]);  Surgeon: Baruch Goldmann, MD;  Location: [facility];  Service: Ophthalmology;  Laterality: Right;  CDE: 8.24   COLONOSCOPY  12/2020   Dr. Adelina Mings (general surgeon): diverticulsos and left sided colitis with erythema in the sigmoid and descending colon. bx: focal mild acute colitis ?infection vs ischemia   COLONOSCOPY  10/2009   Dr. Carlyon Prows [place]: diverticulosis, two tubular adenomas removed. descending colon erythema and mild ulcerations noted with benign biopsies.   EYE SURGERY     HERNIA REPAIR     INTERCOSTAL NERVE BLOCK Right 05/20/2021   Procedure: INTERCOSTAL NERVE BLOCK;  Surgeon: Melrose Nakayama, MD;  Location: [facility];  Service: Thoracic;  Laterality: Right;   LUNG REMOVAL, PARTIAL Right    LYMPH NODE DISSECTION Right 05/20/2021   Procedure: LYMPH  NODE DISSECTION;  Surgeon: Melrose Nakayama, MD;  Location: [facility];  Service: Thoracic;  Laterality: Right;   REVERSE SHOULDER ARTHROPLASTY Right 09/08/2020   Procedure: REVERSE TOTAL SHOULDER ARTHROPLASTY;  Surgeon: Hiram Gash, MD;  Location: [facility];  Service: Orthopedics;  Laterality: Right;   screws and steel plate in neck  (Z3-6)     10-12 years ago  x2    Family History  Problem Relation Age of Onset   Crohn's disease Cousin    Colon cancer Neg Hx     Social History:  reports that he quit smoking about 7 weeks ago. His smoking use included cigarettes. He started smoking about 50 years ago. He has a 49.00 pack-year smoking history. He has never used smokeless tobacco. He reports that he does not currently use alcohol. He reports that he does not currently use drugs.  Allergies: No Known Allergies  Medications: I have reviewed the patient's current medications.  Results for orders placed or performed during the hospital encounter of 07/02/21 (from the past 48 hour(s))  Urine rapid drug screen (hosp performed)     Status: Abnormal   Collection Time: 07/02/21 12:44 AM  Result Value Ref Range   Opiates NONE DETECTED NONE DETECTED   Cocaine NONE DETECTED NONE DETECTED   Benzodiazepines NONE DETECTED NONE DETECTED   Amphetamines NONE DETECTED NONE DETECTED   Tetrahydrocannabinol POSITIVE (A) NONE DETECTED   Barbiturates NONE DETECTED NONE DETECTED    Comment: (NOTE) DRUG SCREEN  FOR MEDICAL PURPOSES ONLY.  IF CONFIRMATION IS NEEDED FOR ANY PURPOSE, NOTIFY LAB WITHIN 5 DAYS.  LOWEST DETECTABLE LIMITS FOR URINE DRUG SCREEN Drug Class                     Cutoff (ng/mL) Amphetamine and metabolites    1000 Barbiturate and metabolites    200 Benzodiazepine                 364 Tricyclics and metabolites     300 Opiates and metabolites        300 Cocaine and metabolites        300 THC                            50 Performed at [facility], [place] [address], [place], [place] [address]   Urinalysis, Routine w reflex microscopic Urine, Clean Catch     Status: Abnormal   Collection Time: 07/02/21 12:45 AM  Result Value Ref Range   Color, Urine YELLOW YELLOW   APPearance HAZY (A) CLEAR   Specific Gravity, Urine 1.023 1.005 - 1.030   pH 5.0 5.0 - 8.0   Glucose, UA NEGATIVE NEGATIVE mg/dL   Hgb urine dipstick NEGATIVE NEGATIVE   Bilirubin Urine NEGATIVE NEGATIVE   Ketones, ur NEGATIVE NEGATIVE mg/dL   Protein, ur NEGATIVE NEGATIVE mg/dL   Nitrite NEGATIVE NEGATIVE   Leukocytes,Ua TRACE (A) NEGATIVE   RBC / HPF 6-10 0 - 5 RBC/hpf   WBC, UA 0-5 0 - 5 WBC/hpf   Bacteria, UA NONE SEEN NONE SEEN   Squamous Epithelial / LPF 0-5 0 - 5   Mucus PRESENT     Comment: Performed at [facility], [place] [address], [place], [place] [address]  Ethanol     Status: None   Collection Time: 07/02/21  8:03 PM  Result Value Ref Range   Alcohol, Ethyl (B) <10 <10 mg/dL    Comment: (NOTE) Lowest detectable limit for serum alcohol is 10 mg/dL.  For medical purposes only. Performed at [facility], [place] [address] [place], [place] [address]   Protime-INR     Status: None   Collection Time: 07/02/21  8:03 PM  Result Value Ref Range   Prothrombin Time 14.7 11.4 - 15.2 seconds   INR 1.1 0.8 - 1.2    Comment: (NOTE) INR goal varies based on device and disease states. Performed at [facility], [place] [address], [place], [place] [address]   APTT     Status: None   Collection Time: 07/02/21  8:03 PM  Result Value Ref Range   aPTT 29 24 - 36 seconds    Comment: Performed at [facility] [address] [place], [place] [address]  CBC     Status: Abnormal   Collection Time: 07/02/21  8:03 PM  Result Value Ref Range   WBC 13.5 (H) 4.0 - 10.5 K/uL   RBC 4.75 4.22 - 5.81 MIL/uL   Hemoglobin 13.2 13.0 - 17.0 g/dL   HCT 40.2 39.0 - 52.0 %   MCV 84.6 80.0 - 100.0 fL   MCH 27.8 26.0 - 34.0 pg   MCHC 32.8 30.0 - 36.0 g/dL   RDW 13.7  11.5 - 15.5 %   Platelets 353 150 - 400 K/uL   nRBC 0.0 0.0 - 0.2 %    Comment: Performed at [facility],  [address], [place], [place] [address]  Differential     Status: Abnormal   Collection Time: 07/02/21  8:03 PM  Result Value Ref Range   Neutrophils Relative % 62 %   Neutro Abs 8.5 (H) 1.7 - 7.7 K/uL   Lymphocytes Relative 27 %   Lymphs Abs 3.7 0.7 - 4.0 K/uL   Monocytes Relative 7 %   Monocytes Absolute 1.0 0.1 - 1.0 K/uL   Eosinophils Relative 2 %   Eosinophils Absolute 0.3 0.0 - 0.5 K/uL   Basophils Relative 1 %   Basophils Absolute 0.1 0.0 - 0.1 K/uL   Immature Granulocytes 1 %   Abs Immature Granulocytes 0.07 0.00 - 0.07 K/uL    Comment: Performed at [facility] [address] [place], [place] [address]  Comprehensive metabolic panel     Status: Abnormal   Collection Time: 07/02/21  8:03 PM  Result Value Ref Range   Sodium 136 135 - 145 mmol/L   Potassium 4.3 3.5 - 5.1 mmol/L   Chloride 103 98 - 111 mmol/L   CO2 23 22 - 32 mmol/L   Glucose, Bld 133 (H) 70 - 99 mg/dL    Comment: Glucose reference range applies only to samples taken after fasting for at least 8 hours.   BUN 12 8 - 23 mg/dL   Creatinine, Ser 0.70 0.61 - 1.24 mg/dL   Calcium 9.3 8.9 - 10.3 mg/dL   Total Protein 7.7 6.5 - 8.1 g/dL   Albumin 3.7 3.5 - 5.0 g/dL   AST 20 15 - 41 U/L   ALT 20 0 - 44 U/L   Alkaline Phosphatase 82 38 - 126 U/L   Total Bilirubin 0.5 0.3 - 1.2 mg/dL   GFR, Estimated >60 >60 mL/min    Comment: (NOTE) Calculated using the CKD-EPI Creatinine Equation (2021)    Anion gap 10 5 - 15    Comment: Performed at [facility] [address], [place], [place] [address]  I-stat chem 8, ED     Status: Abnormal   Collection Time: 07/02/21  8:24 PM  Result Value Ref Range   Sodium 137 135 - 145 mmol/L   Potassium 4.2 3.5 - 5.1 mmol/L   Chloride 102 98 - 111 mmol/L   BUN 12 8 - 23 mg/dL   Creatinine, Ser 0.70 0.61 - 1.24 mg/dL   Glucose, Bld 124 (H) 70 - 99  mg/dL    Comment: Glucose reference range applies only to samples taken after fasting for at least 8 hours.   Calcium, Ion 1.17 1.15 - 1.40 mmol/L   TCO2 27 22 - 32 mmol/L   Hemoglobin 13.9 13.0 - 17.0 g/dL   HCT 41.0 39.0 - 52.0 %  Resp Panel by RT-PCR (Flu A&B, Covid) Nasopharyngeal Swab     Status: None   Collection Time: 07/02/21  9:17 PM   Specimen: Nasopharyngeal Swab; Nasopharyngeal(NP) swabs in vial transport medium  Result Value Ref Range   SARS Coronavirus 2 by RT PCR NEGATIVE NEGATIVE    Comment: (NOTE) SARS-CoV-2 target nucleic acids are NOT DETECTED.  The SARS-CoV-2 RNA is generally detectable in upper respiratory specimens during the acute phase of infection. The lowest concentration of SARS-CoV-2 viral copies this assay can detect is 138 copies/mL. A negative result does not preclude SARS-Cov-2 infection and should not be used as the sole basis for treatment or other patient management decisions. A negative result may occur with  improper specimen collection/handling, submission of  specimen other than nasopharyngeal swab, presence of viral mutation(s) within the areas targeted by this assay, and inadequate number of viral copies(<138 copies/mL). A negative result must be combined with clinical observations, patient history, and epidemiological information. The expected result is Negative.  Fact Sheet for Patients:  EntrepreneurPulse.com.au  Fact Sheet for Healthcare Providers:  IncredibleEmployment.be  This test is no t yet approved or cleared by the [place] FDA and  has been authorized for detection and/or diagnosis of SARS-CoV-2 by FDA under an Emergency Use Authorization (EUA). This EUA will remain  in effect (meaning this test can be used) for the duration of the COVID-19 declaration under Section 564(b)(1) of the Act, 21 U.S.C.section 360bbb-3(b)(1), unless the authorization is terminated  or revoked sooner.        Influenza A by PCR NEGATIVE NEGATIVE   Influenza B by PCR NEGATIVE NEGATIVE    Comment: (NOTE) The Xpert Xpress SARS-CoV-2/FLU/RSV plus assay is intended as an aid in the diagnosis of influenza from Nasopharyngeal swab specimens and should not be used as a sole basis for treatment. Nasal washings and aspirates are unacceptable for Xpert Xpress SARS-CoV-2/FLU/RSV testing.  Fact Sheet for Patients: EntrepreneurPulse.com.au  Fact Sheet for Healthcare Providers: IncredibleEmployment.be  This test is not yet approved or cleared by the [place] FDA and has been authorized for detection and/or diagnosis of SARS-CoV-2 by FDA under an Emergency Use Authorization (EUA). This EUA will remain in effect (meaning this test can be used) for the duration of the COVID-19 declaration under Section 564(b)(1) of the Act, 21 U.S.C. section 360bbb-3(b)(1), unless the authorization is terminated or revoked.  Performed at [facility], [place] [address] [place], [place] [address]   CBG monitoring, ED     Status: Abnormal   Collection Time: 07/03/21  8:53 AM  Result Value Ref Range   Glucose-Capillary 152 (H) 70 - 99 mg/dL    Comment: Glucose reference range applies only to samples taken after fasting for at least 8 hours.   Comment 1 Notify RN    Comment 2 Document in Chart   CBG monitoring, ED     Status: Abnormal   Collection Time: 07/03/21 11:52 AM  Result Value Ref Range   Glucose-Capillary 155 (H) 70 - 99 mg/dL    Comment: Glucose reference range applies only to samples taken after fasting for at least 8 hours.   Comment 1 Notify RN    Comment 2 Document in Chart     CT HEAD W & WO CONTRAST (5MM)  Result Date: 07/03/2021 CLINICAL DATA:  66 year old male with multiple falls. Neurologic deficit. Left extremity numbness for 2 days. Cavitary right lung lesion suspicious for lung cancer in October. EXAM: CT HEAD WITHOUT AND WITH CONTRAST TECHNIQUE:  Contiguous axial images were obtained from the base of the skull through the vertex without and with intravenous contrast. RADIATION DOSE REDUCTION: This exam was performed according to the departmental dose-optimization program which includes automated exposure control, adjustment of the mA and/or kV according to patient size and/or use of iterative reconstruction technique. CONTRAST:  27mL OMNIPAQUE IOHEXOL 300 MG/ML  SOLN COMPARISON:  PET-CT 04/04/2021. FINDINGS: Brain: Multifocal cerebral vasogenic edema. Anterior left frontal lobe 3 cm rim enhancing mass (series 7, image 21) with regional vasogenic edema. Posterior right superior frontal gyrus 2.6 cm rim enhancing mass with regional vasogenic edema. This lesion is perirolandic, with enhancement and edema subsequently affecting the left extremity motor and sensory areas. Mass effect on the lateral ventricles, but  no midline shift. Basilar cisterns remain patent. No ventriculomegaly. No other No abnormal enhancement identified. No acute intracranial hemorrhage identified. No cortically based acute infarct identified. Vascular: Mild Calcified atherosclerosis at the skull base. Major intracranial vascular structures are enhancing as expected and appear to be patent. Skull: No acute or suspicious osseous lesion identified. Sinuses/Orbits: Visualized paranasal sinuses and mastoids are clear. Other: Postoperative changes to both globes. No acute orbit or scalp soft tissue finding. IMPRESSION: 1. Positive for Metastatic Disease to the brain. Two rim enhancing masses, up to 3 cm diameter, with vasogenic edema. Superior Right Perirolandic Cortex and anterior left frontal lobe affected. Mass effect on the lateral ventricles but no midline shift and basilar cisterns remain normal. 2. Brain MRI without and with contrast might detect additional lesions. Electronically Signed   By: Genevie Ann M.D.   On: 07/03/2021 05:59   MR BRAIN W WO CONTRAST  Result Date:  07/03/2021 CLINICAL DATA:  Rule out metastatic disease. Squamous cell carcinoma lung. Abnormal head CT. EXAM: MRI HEAD WITHOUT AND WITH CONTRAST TECHNIQUE: Multiplanar, multiecho pulse sequences of the brain and surrounding structures were obtained without and with intravenous contrast. CONTRAST:  8.96mL GADAVIST GADOBUTROL 1 MMOL/ML IV SOLN COMPARISON:  CT head 07/03/2021 FINDINGS: Brain: 3 enhancing lesions in the brain are present compatible with metastatic disease. 2.8 cm left frontal mass with peripheral enhancement and central necrosis. Minimal associated hemorrhage. Moderate surrounding edema with mild midline shift to the right. 2.3 cm lesion in the precentral gyrus on the right. There is central necrosis with internal hemorrhage. Moderate white matter edema and local mass effect. 3 mm enhancing lesion left precentral gyrus without significant edema compatible with metastatic disease. Ventricle size normal.  Negative for acute infarct. Vascular: Normal arterial flow voids Skull and upper cervical spine: Negative Sinuses/Orbits: Paranasal sinuses clear. Bilateral cataract extraction Other: None IMPRESSION: Findings compatible with metastatic disease to brain. Three lesions are identified. Left frontal lesion and right precentral gyrus lesion show significant edema and local mass effect. 3 mm lesion left precentral gyrus. Electronically Signed   By: Franchot Gallo M.D.   On: 07/03/2021 11:36   CT CHEST ABDOMEN PELVIS W CONTRAST  Result Date: 07/03/2021 CLINICAL DATA:  Brain metastatic disease, status post right lower lobectomy EXAM: CT CHEST, ABDOMEN, AND PELVIS WITH CONTRAST TECHNIQUE: Multidetector CT imaging of the chest, abdomen and pelvis was performed following the standard protocol during bolus administration of intravenous contrast. RADIATION DOSE REDUCTION: This exam was performed according to the departmental dose-optimization program which includes automated exposure control, adjustment of the mA  and/or kV according to patient size and/or use of iterative reconstruction technique. CONTRAST:  17mL OMNIPAQUE IOHEXOL 300 MG/ML SOLN, additional oral enteric contrast COMPARISON:  None. FINDINGS: CT CHEST FINDINGS Cardiovascular: Aortic atherosclerosis. Normal heart size. Three-vessel coronary artery calcifications and stents. No pericardial effusion. Mediastinum/Nodes: No enlarged mediastinal, hilar, or axillary lymph nodes. Thyroid gland, trachea, and esophagus demonstrate no significant findings. Lungs/Pleura: Status post right lower lobectomy. Trace, loculated right pleural effusion, with a small, loculated air component superiorly and dependently (series 5, image 86). Moderate centrilobular emphysema. Clustered centrilobular and tree-in-bud ground-glass opacities in the bilateral lung bases (series 5, image 126). No pleural effusion or pneumothorax. Musculoskeletal: No chest wall mass or suspicious osseous lesions identified. CT ABDOMEN PELVIS FINDINGS Hepatobiliary: No solid liver abnormality is seen. Faintly rim calcified gallstone in the gallbladder. No gallbladder wall thickening, or biliary dilatation. Pancreas: Unremarkable. No pancreatic ductal dilatation or surrounding inflammatory changes. Spleen: Normal in  size without significant abnormality. Adrenals/Urinary Tract: Adrenal glands are unremarkable. Kidneys are normal, without renal calculi, solid lesion, or hydronephrosis. Bladder is unremarkable. Stomach/Bowel: Stomach is within normal limits. Appendix appears normal. No evidence of bowel wall thickening, distention, or inflammatory changes. Descending and sigmoid diverticulosis. Vascular/Lymphatic: Aortic atherosclerosis. No enlarged abdominal or pelvic lymph nodes. Reproductive: No mass or other abnormality. Other: No abdominal wall hernia or abnormality. No ascites. Musculoskeletal: No acute osseous findings. IMPRESSION: 1. Status post right lower lobectomy. 2. No evidence of lymphadenopathy  or metastatic disease in the chest, abdomen, or pelvis. Please note, that in order to ensure subspecialty interpretation, restaging examinations for known malignancies should generally be deferred to the nonemergent, outpatient setting. 3. Clustered centrilobular and tree-in-bud ground-glass opacities in the bilateral lung bases. Findings suggest atypical infection or aspiration. 4. Cholelithiasis. 5. Coronary artery disease. Aortic Atherosclerosis (ICD10-I70.0) and Emphysema (ICD10-J43.9). Electronically Signed   By: Delanna Ahmadi M.D.   On: 07/03/2021 13:29   DG Chest Port 1 View  Result Date: 07/03/2021 CLINICAL DATA:  Left arm and leg numbness.  Multiple falls. EXAM: PORTABLE CHEST 1 VIEW COMPARISON:  06/14/2021. FINDINGS: The heart size and mediastinal contours are within normal limits. Atherosclerotic calcification of the aorta is noted. There is a persistent small to moderate right pleural effusion with mild atelectasis at the right lung base. The left lung is clear. No pneumothorax. Shoulder arthroplasty changes are noted on the right. IMPRESSION: Persistent small to moderate right pleural effusion, not significantly changed from the prior exam. Electronically Signed   By: Brett Fairy M.D.   On: 07/03/2021 04:56    Review of Systems  Constitutional:  Positive for activity change.  HENT: Negative.    Eyes: Negative.   Respiratory: Negative.    Cardiovascular: Negative.   Gastrointestinal: Negative.   Endocrine: Negative.   Genitourinary: Negative.   Musculoskeletal:  Positive for gait problem.  Skin: Negative.   Allergic/Immunologic: Negative.   Neurological:  Positive for weakness and numbness.  Blood pressure (!) 144/69, pulse 92, temperature 98 F (36.7 C), resp. rate 16, height 6\' 1"  (1.854 m), weight 85.3 kg, SpO2 100 %. Physical Exam Constitutional:      Comments: Disheveled appearance  HENT:     Head: Normocephalic and atraumatic.     Right Ear: External ear normal.      Left Ear: External ear normal.     Nose: Nose normal.     Mouth/Throat:     Mouth: Mucous membranes are moist.     Pharynx: Oropharynx is clear.  Eyes:     Extraocular Movements: Extraocular movements intact.     Conjunctiva/sclera: Conjunctivae normal.     Pupils: Pupils are equal, round, and reactive to light.  Cardiovascular:     Rate and Rhythm: Normal rate and regular rhythm.  Pulmonary:     Effort: Pulmonary effort is normal.  Abdominal:     General: Abdomen is flat.  Musculoskeletal:     Cervical back: Normal range of motion.  Skin:    General: Skin is warm and dry.  Neurological:     Mental Status: He is alert.     Motor: Weakness present. No pronator drift.     Comments: 2/5 deltoids, biceps, triceps 0/5 intrinsics, adductors, abductors of the digits 4/5 left hip flexors, extensors, quadriceps, hamstrings, dorsiflexors, plantar flexors 5/5 right upper and lower extremities Did not assess gait Poor coordination, food is spread over his bed, belly, and thighs    Assessment/Plan: Needs a 3T  brain MRI to look for other cerebral lesions. Will need Radiation oncology to assess after MRI.  Will need Oncology to provide an expected prognosis also Being transferred to [facility] center Will be discussed at brain tumor conference I presume.  Continue decadron Ashok Pall 07/03/2021, 1:39 PM

## 2021-07-03 NOTE — Assessment & Plan Note (Signed)
Patient reports having urinary frequency which sounds like incomplete voiding.  Previously advised to starton what sounds like Flomax, but did not understand of the medication was to work. After discussion with patient he reports wanting to try this medicine. -Flomax 0.4 mg daily and will likely need prescription at discharge

## 2021-07-03 NOTE — Assessment & Plan Note (Signed)
Patient status post robotic resection of the right lower lobe on 05/20/2021 with Dr. Roxan Hockey.  Biopsies revealed invasive squamous cell carcinoma.  Thought to be stage Ib at that time as lymph nodes that were resected were negative.  He had followed up with Dr. Julien Nordmann and there was no plans for chemo or radiation at that time.  Chest x-ray from today noted small to moderate right-sided pleural effusion unchanged from previous on 1/3.   -Dr. Earlie Server added to the care team

## 2021-07-03 NOTE — Assessment & Plan Note (Addendum)
Glucose 124 on admission.  GlucoseLast hemoglobin A1c 6.2 on 05/18/2021.   home medication regimen includes metformin 1000 mg daily. -Hypoglycemic protocols -Hold metformin -CBGs before every meal with sensitive SSI while on high-dose steroid -Adjust regimen as needed

## 2021-07-03 NOTE — Assessment & Plan Note (Signed)
Patient has at least a 49 smoking pack year history.  He states that he quit smoking 05/19/2021. -Continue to encourage cessation of tobacco use

## 2021-07-03 NOTE — Consult Note (Signed)
Neurology Consultation Reason for Consult: Left-sided weakness Referring Physician: Christy Gentles, D Committee  CC: Left-sided weakness  History is obtained from: Patient  HPI: Stephen Diaz is a 66 y.o. male with a history of 10 days of worsening left-sided weakness.  He initially went in to [facility] to be evaluated and at the time his weakness for the distribution that was most consistent with a peripheral nerve.  He was discharged, but he states that since that time he has been steadily worsening and therefore sought further reevaluation here today.  He states that he has been managing to get up and move around at home, but has significant difficulty.  He has fallen several times.  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  Past Medical History:  Diagnosis Date   Arthritis    right shoulder   Cancer (HCC)    COPD (chronic obstructive pulmonary disease) (HCC)    Diabetes mellitus without complication (HCC)    Type II   Hypertension    Right foot drop    from pinched nerve in back, wears brace     Family History  Problem Relation Age of Onset   Crohn's disease Cousin    Colon cancer Neg Hx      Social History:  reports that he quit smoking about 3 weeks ago. His smoking use included cigarettes. He started smoking about 50 years ago. He has a 12.50 pack-year smoking history. He has never used smokeless tobacco. He reports that he does not currently use alcohol. He reports that he does not currently use drugs.   Exam: Current vital signs: BP (!) 123/93    Pulse 92    Temp 97.8 F (36.6 C) (Oral)    Resp 18    Ht 6\' 1"  (1.854 m)    Wt 85.3 kg    SpO2 98%    BMI 24.80 kg/m  Vital signs in last 24 hours: Temp:  [97.8 F (36.6 C)] 97.8 F (36.6 C) (01/21 1956) Pulse Rate:  [91-107] 92 (01/22 0400) Resp:  [15-19] 18 (01/22 0400) BP: (114-139)/(83-100) 123/93 (01/22 0345) SpO2:  [95 %-98 %] 98 % (01/22 0400) Weight:  [85.3 kg] 85.3 kg (01/21  1953)   Physical Exam  Constitutional: Appears well-developed and well-nourished.  Psych: Affect appropriate to situation Eyes: No scleral injection HENT: No OP obstruction MSK: no joint deformities.  Cardiovascular: Normal rate and regular rhythm.  Respiratory: Effort normal, non-labored breathing GI: Soft.  No distension. There is no tenderness.  Skin: WDI  Neuro: Mental Status: Patient is awake, alert, oriented to person, place, month, year, and situation. Patient is able to give a clear and coherent history. No signs of aphasia or neglect Cranial Nerves: II: Visual Fields are full. R pupil very silghtly larger than left, both are reactive.  III,IV, VI: EOMI without ptosis or diploplia.  V: Facial sensation is diminished to temperature on the left VII: Facial movement is symmetric.  VIII: hearing is intact to voice X: Uvula elevates symmetrically XI: Shoulder shrug is symmetric. XII: tongue is midline without atrophy or fasciculations.  Motor: Tone is normal. Bulk is normal. 5/5 strength was present on the right, on the left he has 2-3/5 LUE, 4/5 LLE Sensory: Sensation is diminished to temp in the arm, symmetric in the legs.  Deep Tendon Reflexes: 2+ and symmetric in the biceps and patellae.  Plantars: Striatal toes bilaterally.  Cerebellar: FNF and HKS are intact bilaterally  I have reviewed labs in epic and the results pertinent to this consultation are: Cmp - unremarkable CBC with leukocytosis, 13.5. oteherwise normal  I have reviewed the images obtained: CT head-multifocal metastatic disease of the brain  Impression: 66 year old male with multifocal metastatic disease of the brain.  I will start him on steroids to help with the edema, but he will need neurosurgical consultation and physical therapy.  Consider MRI brain with and without to see if it is truly two isolated lesions or if there are more.  Recommendations: 1) Decadron 10 mg x 1, then 4 mg 3 times  daily 2) would consider discussion with neurosurgery and oncology to plan next steps 3) physical therapy 4) MRI brain w/wo  5) neurology will be available on an as-needed basis.   Roland Rack, MD [facility] [phone]  If 7pm- 7am, please page neurology on call as listed in [place].

## 2021-07-03 NOTE — Progress Notes (Signed)
°   07/03/21 1451  Assess: MEWS Score  Temp 99.7 F (37.6 C)  BP 128/86  Pulse Rate (!) 117  Resp 18  SpO2 96 %  Assess: MEWS Score  MEWS Temp 0  MEWS Systolic 0  MEWS Pulse 2  MEWS RR 0  MEWS LOC 0  MEWS Score 2  MEWS Score Color Yellow  Assess: if the MEWS score is Yellow or Red  Were vital signs taken at a resting state? Yes  Focused Assessment No change from prior assessment  Does the patient meet 2 or more of the SIRS criteria? No  MEWS guidelines implemented *See Row Information* Yes  Treat  MEWS Interventions Administered scheduled meds/treatments  Pain Scale 0-10  Pain Score 0  Take Vital Signs  Increase Vital Sign Frequency  Yellow: Q 2hr X 2 then Q 4hr X 2, if remains yellow, continue Q 4hrs  Escalate  MEWS: Escalate Yellow: discuss with charge nurse/RN and consider discussing with provider and RRT  Notify: Charge Nurse/RN  Name of Charge Nurse/RN Notified Hinton Dyer  Date Charge Nurse/RN Notified 07/03/21  Time Charge Nurse/RN Notified 1500  Document  Patient Outcome Stabilized after interventions  Progress note created (see row info) Yes  Assess: SIRS CRITERIA  SIRS Temperature  0  SIRS Pulse 1  SIRS Respirations  0  SIRS WBC 0  SIRS Score Sum  1

## 2021-07-03 NOTE — Assessment & Plan Note (Signed)
Patient denies having any significant cough or wheezing.  On physical exam lung sounds clear.  Home medication regimen includes trilogy Ellipta inhaler as needed. -Continue trilogy Ellipta and albuterol nebs as needed for shortness of breath/wheezing

## 2021-07-03 NOTE — Assessment & Plan Note (Signed)
Acute. WBC elevated at 13.5.  Suspect secondary to above.  Patient does report urinary frequency, but suspect symptoms likely secondary to BPH. -Check urinalysis

## 2021-07-03 NOTE — ED Provider Notes (Signed)
[facility] EMERGENCY DEPARTMENT Provider Note   CSN: [id number] Arrival date & time: 07/02/21  1946     History  Chief Complaint  Patient presents with   Fall   Numbness    Stephen Diaz is a 66 y.o. male.  The history is provided by the patient.  Weakness Severity:  Moderate Onset quality:  Gradual Duration:  1 week Timing:  Constant Progression:  Worsening Chronicity:  New Relieved by:  Nothing Worsened by:  Activity (Walking) Associated symptoms: chest pain, cough, difficulty walking and stroke symptoms   Associated symptoms: no abdominal pain, no dizziness, no numbness in extremities, no fever, no headaches, no loss of consciousness, no nausea, no vision change and no vomiting   Associated symptoms comment:  He reports mild chest wall pain from recent surgery and cough Patient presents for ongoing left-sided weakness.  He reports at least a week ago he began having weakness in his left arm and then progressed to his left leg.  He reports now he has difficulty walking and has had multiple falls.  He denies any traumatic injury from the falls.  No head injury.  No LOC No facial weakness.  No visual changes.  No slurred speech is reported   Patient has history of COPD, squamous cell lung carcinoma with recent lobectomy Home Medications Prior to Admission medications   Medication Sig Start Date End Date Taking? Authorizing Provider  albuterol (VENTOLIN HFA) 108 (90 Base) MCG/ACT inhaler Inhale 2 puffs into the lungs every 6 (six) hours as needed for wheezing or shortness of breath.   Yes [provider]  aspirin 81 MG EC tablet Take 81 mg by mouth daily.   Yes [provider]  ibuprofen (ADVIL) 200 MG tablet Take 2 tablets (400 mg total) by mouth every 6 (six) hours as needed for mild pain. 06/02/21  Yes Barrett, Erin R, PA-C  lisinopril (ZESTRIL) 10 MG tablet Take 10 mg by mouth daily.   Yes [provider]  metFORMIN  (GLUCOPHAGE) 500 MG tablet Take 1,000 mg by mouth daily with breakfast. 05/10/20  Yes [provider]  Multiple Vitamins-Minerals (CENTRUM SILVER 50+MEN) TABS Take 1 tablet by mouth daily.   Yes [provider]  oxyCODONE (OXY IR/ROXICODONE) 5 MG immediate release tablet Take 1 tablet (5 mg total) by mouth every 4 (four) hours as needed for moderate pain. 06/02/21  Yes Barrett, Erin R, PA-C  TRELEGY ELLIPTA 100-62.5-25 MCG/INH AEPB Inhale 1 puff into the lungs daily. 01/01/21  Yes [provider]      Allergies    Patient has no known allergies.    Review of Systems   Review of Systems  Constitutional:  Negative for fever.  Eyes:  Negative for visual disturbance.  Respiratory:  Positive for cough.   Cardiovascular:  Positive for chest pain.  Gastrointestinal:  Negative for abdominal pain, nausea and vomiting.  Musculoskeletal:  Negative for back pain and neck pain.  Neurological:  Positive for weakness. Negative for dizziness, loss of consciousness, speech difficulty, numbness and headaches.  All other systems reviewed and are negative.  Physical Exam Updated Vital Signs BP (!) 144/69    Pulse 92    Temp 98 F (36.7 C)    Resp 16    Ht 1.854 m (6\' 1" )    Wt 85.3 kg    SpO2 100%    BMI 24.80 kg/m  Physical Exam CONSTITUTIONAL: Mildly disheveled, elderly HEAD: Normocephalic/atraumatic EYES: EOMI/PERRL, no nystagmus ENMT: Mucous  membranes moist NECK: supple no meningeal signs Spine-no CTL tenderness noted, no bruising/crepitance/stepoffs noted to spine CV: S1/S2 noted, no murmurs/rubs/gallops noted LUNGS: Lungs are clear to auscultation bilaterally, no apparent distress Chest-no bruising or crepitus ABDOMEN: soft, nontender, no rebound or guarding GU:no cva tenderness NEURO:Awake/alert, face symmetric, there is no motor or sensory deficit noted in the right upper/right lower extremity Patient has no grip with his left hand.  He has obvious weakness with  left wrist flexion extension as well as elbow flexion extension.  He has a drift in the left upper extremity Patient has a drift in left lower extremity. Cranial nerves 3/4/5/6/12/18/08/11/12 tested and intact Sensation to light touch intact in all extremities EXTREMITIES: pulses normal, full ROM All other extremities/joints palpated/ranged and nontender SKIN: warm, color normal PSYCH: no abnormalities of mood noted  ED Results / Procedures / Treatments   Labs (all labs ordered are listed, but only abnormal results are displayed) Labs Reviewed  CBC - Abnormal; Notable for the following components:      Result Value   WBC 13.5 (*)    All other components within normal limits  DIFFERENTIAL - Abnormal; Notable for the following components:   Neutro Abs 8.5 (*)    All other components within normal limits  COMPREHENSIVE METABOLIC PANEL - Abnormal; Notable for the following components:   Glucose, Bld 133 (*)    All other components within normal limits  RAPID URINE DRUG SCREEN, HOSP PERFORMED - Abnormal; Notable for the following components:   Tetrahydrocannabinol POSITIVE (*)    All other components within normal limits  URINALYSIS, ROUTINE W REFLEX MICROSCOPIC - Abnormal; Notable for the following components:   APPearance HAZY (*)    Leukocytes,Ua TRACE (*)    All other components within normal limits  I-STAT CHEM 8, ED - Abnormal; Notable for the following components:   Glucose, Bld 124 (*)    All other components within normal limits  RESP PANEL BY RT-PCR (FLU A&B, COVID) ARPGX2  ETHANOL  PROTIME-INR  APTT    EKG EKG Interpretation  Date/Time:  Saturday July 02 2021 19:54:00 EST Ventricular Rate:  104 PR Interval:  152 QRS Duration: 76 QT Interval:  320 QTC Calculation: 420 R Axis:   63 Text Interpretation: Sinus tachycardia Cannot rule out Anterior infarct , age undetermined Abnormal ECG Interpretation limited secondary to artifact Confirmed by Ripley Fraise  [phone]) on 07/03/2021 3:49:29 AM  Radiology CT HEAD W & WO CONTRAST (5MM)  Result Date: 07/03/2021 CLINICAL DATA:  66 year old male with multiple falls. Neurologic deficit. Left extremity numbness for 2 days. Cavitary right lung lesion suspicious for lung cancer in October. EXAM: CT HEAD WITHOUT AND WITH CONTRAST TECHNIQUE: Contiguous axial images were obtained from the base of the skull through the vertex without and with intravenous contrast. RADIATION DOSE REDUCTION: This exam was performed according to the departmental dose-optimization program which includes automated exposure control, adjustment of the mA and/or kV according to patient size and/or use of iterative reconstruction technique. CONTRAST:  35mL OMNIPAQUE IOHEXOL 300 MG/ML  SOLN COMPARISON:  PET-CT 04/04/2021. FINDINGS: Brain: Multifocal cerebral vasogenic edema. Anterior left frontal lobe 3 cm rim enhancing mass (series 7, image 21) with regional vasogenic edema. Posterior right superior frontal gyrus 2.6 cm rim enhancing mass with regional vasogenic edema. This lesion is perirolandic, with enhancement and edema subsequently affecting the left extremity motor and sensory areas. Mass effect on the lateral ventricles, but no midline shift. Basilar cisterns remain patent. No ventriculomegaly. No other No  abnormal enhancement identified. No acute intracranial hemorrhage identified. No cortically based acute infarct identified. Vascular: Mild Calcified atherosclerosis at the skull base. Major intracranial vascular structures are enhancing as expected and appear to be patent. Skull: No acute or suspicious osseous lesion identified. Sinuses/Orbits: Visualized paranasal sinuses and mastoids are clear. Other: Postoperative changes to both globes. No acute orbit or scalp soft tissue finding. IMPRESSION: 1. Positive for Metastatic Disease to the brain. Two rim enhancing masses, up to 3 cm diameter, with vasogenic edema. Superior Right Perirolandic Cortex  and anterior left frontal lobe affected. Mass effect on the lateral ventricles but no midline shift and basilar cisterns remain normal. 2. Brain MRI without and with contrast might detect additional lesions. Electronically Signed   By: Genevie Ann M.D.   On: 07/03/2021 05:59   DG Chest Port 1 View  Result Date: 07/03/2021 CLINICAL DATA:  Left arm and leg numbness.  Multiple falls. EXAM: PORTABLE CHEST 1 VIEW COMPARISON:  06/14/2021. FINDINGS: The heart size and mediastinal contours are within normal limits. Atherosclerotic calcification of the aorta is noted. There is a persistent small to moderate right pleural effusion with mild atelectasis at the right lung base. The left lung is clear. No pneumothorax. Shoulder arthroplasty changes are noted on the right. IMPRESSION: Persistent small to moderate right pleural effusion, not significantly changed from the prior exam. Electronically Signed   By: Brett Fairy M.D.   On: 07/03/2021 04:56    Procedures .Critical Care Performed by: Ripley Fraise, MD Authorized by: Ripley Fraise, MD   Critical care provider statement:    Critical care time (minutes):  45   Critical care start time:  07/03/2021 5:00 AM   Critical care end time:  07/03/2021 5:45 AM   Critical care time was exclusive of:  Separately billable procedures and treating other patients   Critical care was necessary to treat or prevent imminent or life-threatening deterioration of the following conditions:  CNS failure or compromise   Critical care was time spent personally by me on the following activities:  Discussions with consultants, development of treatment plan with patient or surrogate, evaluation of patient's response to treatment, examination of patient, re-evaluation of patient's condition, pulse oximetry, ordering and review of radiographic studies, review of old charts and ordering and review of laboratory studies   I assumed direction of critical care for this patient from another  provider in my specialty: no     Care discussed with: admitting provider      Medications Ordered in ED Medications  dexamethasone (DECADRON) tablet 4 mg (has no administration in time range)  iohexol (OMNIPAQUE) 300 MG/ML solution 75 mL (75 mLs Intravenous Contrast Given 07/03/21 0535)  dexamethasone (DECADRON) injection 10 mg (10 mg Intravenous Given 07/03/21 0640)    ED Course/ Medical Decision Making/ A&P Clinical Course as of 07/03/21 0716  Nancy Fetter Jul 03, 2021  0429 Patient has been seen by neurology Dr. Leonel Ramsay he suspects this represents a late presenting  Stroke.  Plan for CT head and admit to medicine [DW]  [phone] CT imaging was personally reviewed which reveals metastatic brain tumors.  Discussed with Dr. Christella Noa with neurosurgery.  He will see the patient. [DW]  [phone] Discussed with Dr. Alcario Drought for admission to the hospitalist.  Patient would likely benefit from transfer to [facility] to be evaluated by oncology [DW]  [phone] Patient was updated on plan and findings.  No family was available to discuss [DW]    Clinical Course User Index [DW]  Ripley Fraise, MD                           Medical Decision Making Amount and/or Complexity of Data Reviewed Independent Historian: parent External Data Reviewed: labs, radiology, ECG and notes. Labs: ordered. Decision-making details documented in ED Course. Radiology: ordered and independent interpretation performed. Decision-making details documented in ED Course. ECG/medicine tests: ordered and independent interpretation performed. Decision-making details documented in ED Course.  Risk Prescription drug management. Decision regarding hospitalization.   This patient presents to the ED for concern of left-sided paresis, this involves an extensive number of treatment options, and is a complaint that carries with it a high risk of complications and morbidity.  The differential diagnosis includes stroke, intracranial  hemorrhage, spinal myelopathy, brain tumor  Comorbidities that complicate the patient evaluation: Patients presentation is complicated by their history of lung cancer  Social Determinants of Health: Patients smoking increases the complexity of managing their presentation  Additional history obtained: Records reviewed oncology and CT surgery notes reviewed  Lab Tests: I Ordered, and personally interpreted labs.  The pertinent results include: Positive for marijuana, mild leukocytosis  Imaging Studies ordered: I ordered imaging studies including CT scan head I independently visualized and interpreted imaging which showed metastatic brain tumor I agree with the radiologist interpretation  Cardiac Monitoring: The patient was maintained on a cardiac monitor.  I personally viewed and interpreted the cardiac monitor which showed an underlying rhythm of:  sinus rhythm  Medicines ordered and prescription drug management: I ordered medication including Decadron for brain tumor Reevaluation of the patient after these medicines showed that the patient    stayed the same  Critical Interventions:       Decadron IV, neurology consultations  Consultations Obtained: I requested consultation with the admitting physician Dr. Alcario Drought and consultant Dr. Leonel Ramsay and Dr. Christella Noa, and discussed  findings as well as pertinent plan - they recommend: Admission  Reevaluation: After the interventions noted above, I reevaluated the patient and found that they have :stayed the same  Complexity of problems addressed: Patients presentation is most consistent with  acute complicated illness/injury requiring diagnostic workup      Disposition: After consideration of the diagnostic results and the patients response to treatment,  I feel that the patent would benefit from admission.            Final Clinical Impression(s) / ED Diagnoses Final diagnoses:  Brain tumor ([place])  Hemiparesis,  unspecified hemiparesis etiology, unspecified laterality [facility])    Rx / DC Orders ED Discharge Orders     None         Ripley Fraise, MD 07/03/21 [phone]

## 2021-07-03 NOTE — ED Notes (Signed)
Patient transported to MRI 

## 2021-07-03 NOTE — H&P (Addendum)
History and Physical    Stephen Diaz IWL:[id number] DOB: 1956/01/22 DOA: 07/02/2021  Referring MD/NP/PA: Jennette Kettle, DO PCP: [name], PA-C  Patient coming from: Home  Chief Complaint: Left-sided weakness  I have personally briefly reviewed patient's old medical records in [place]   HPI: Stephen Diaz is a 66 y.o. male with medical history significant of hypertension, COPD, diabetes mellitus type 2, tobacco abuse and squamous cell carcinoma of the lung who presents with complaints of progressively worsening left-sided weakness.  He had just recently been hospitalized and underwent robotic right lower lobectomy 05/20/2021 with Dr. Koleen Nimrod where biopsies revealed squamous cell carcinoma of the lung.  About a week after being discharged from that hospitalization patient noticed that he was unable to open or close his left hand and that it felt weak.  Due to his symptoms he had been seen in the ED at [facility] on 06/24/2021, case was discussed with neurology who felt patient could have outpatient follow-up.  Imaging of the brain was not performed at that time.  He states that he quit smoking 05/19/2021.  He had smoked for over 49 years anywhere from 1 -1.5 packs per day on average.  Since that time patient reports that he has been unable to use his left hand at all.  Additionally he is noted having some weakness in his left leg as well.  Mr. Kolenda states that he has been falling.  He fell out of the truck the other day and notes several other falls prior to coming into the hospital.  Whenever he starts leaning to left side he states that he is got a fall.  He lives alone and states that he has had a difficult time getting around.  Patient has an aunt that is local, but is unable to drive and his daughter lives out of state.  Otherwise patient denies having any significant fever, headache, chest pain, shortness of breath, cough, nausea, vomiting, or diarrhea.  He does notes  urinary frequency, but relates it to the metformin.  Patient had been advised to start what sounds like Flomax, but thought it was going to make him pee more frequently and therefore never started it.  ED Course: Upon admission into the emergency department patient was seen to be afebrile, pulse 84-1 07, respirations 13-22, and all other vital signs maintained.  Labs significant for WBC 13.5. Chest x-ray noted persistent small to moderate right-sided pleural effusion not significantly changed from prior x-ray from 1/3. CT scan of the brain was significant for 2 g 3 masses up to 3 cm in diameter with vasogenic edema present mass-effect on the lateral ventricles, but no midline shift appreciated.  Neurology had been consulted.  It was recommended he be started on steroids, discuss case with neurosurgery along with oncology to plan neck steps, MRI of the brain with and without contrast, and physical therapy consult.  Patient had received 10 mg of Decadron IV.  Review of Systems  Constitutional:  Negative for fever.  HENT:  Negative for nosebleeds.   Eyes:  Negative for photophobia and pain.  Respiratory:  Negative for cough.   Cardiovascular:  Negative for chest pain and leg swelling.  Gastrointestinal:  Negative for abdominal pain, diarrhea, nausea and vomiting.  Genitourinary:  Positive for frequency. Negative for dysuria.  Musculoskeletal:  Positive for falls.  Neurological:  Positive for focal weakness. Negative for headaches.  Psychiatric/Behavioral:  Negative for substance abuse.    Past Medical History:  Diagnosis Date   Arthritis    right shoulder   Cancer ([place])    COPD (chronic obstructive pulmonary disease) (HCC)    Diabetes mellitus without complication (HCC)    Type II   Hypertension    Right foot drop    from pinched nerve in back, wears brace    Past Surgical History:  Procedure Laterality Date   CATARACT EXTRACTION Left    CATARACT EXTRACTION W/PHACO Right 08/13/2020    Procedure: CATARACT EXTRACTION PHACO AND INTRAOCULAR LENS PLACEMENT ([place]);  Surgeon: Baruch Goldmann, MD;  Location: [facility];  Service: Ophthalmology;  Laterality: Right;  CDE: 8.24   COLONOSCOPY  12/2020   Dr. Adelina Mings (general surgeon): diverticulsos and left sided colitis with erythema in the sigmoid and descending colon. bx: focal mild acute colitis ?infection vs ischemia   COLONOSCOPY  10/2009   Dr. Carlyon Prows [place]: diverticulosis, two tubular adenomas removed. descending colon erythema and mild ulcerations noted with benign biopsies.   EYE SURGERY     HERNIA REPAIR     INTERCOSTAL NERVE BLOCK Right 05/20/2021   Procedure: INTERCOSTAL NERVE BLOCK;  Surgeon: Melrose Nakayama, MD;  Location: [place];  Service: Thoracic;  Laterality: Right;   LUNG REMOVAL, PARTIAL Right    LYMPH NODE DISSECTION Right 05/20/2021   Procedure: LYMPH NODE DISSECTION;  Surgeon: Melrose Nakayama, MD;  Location: [place];  Service: Thoracic;  Laterality: Right;   REVERSE SHOULDER ARTHROPLASTY Right 09/08/2020   Procedure: REVERSE TOTAL SHOULDER ARTHROPLASTY;  Surgeon: Hiram Gash, MD;  Location: [place];  Service: Orthopedics;  Laterality: Right;   screws and steel plate in neck  (I2-9)     10-12 years ago  x2     reports that he quit smoking about 3 weeks ago. His smoking use included cigarettes. He started smoking about 50 years ago. He has a 12.50 pack-year smoking history. He has never used smokeless tobacco. He reports that he does not currently use alcohol. He reports that he does not currently use drugs.  No Known Allergies  Family History  Problem Relation Age of Onset   Crohn's disease Cousin    Colon cancer Neg Hx     Prior to Admission medications   Medication Sig Start Date End Date Taking? Authorizing Provider  albuterol (VENTOLIN HFA) 108 (90 Base) MCG/ACT inhaler Inhale 2 puffs into the lungs every 6 (six) hours as needed for wheezing or shortness of breath.   Yes  [provider]  aspirin 81 MG EC tablet Take 81 mg by mouth daily.   Yes [provider]  ibuprofen (ADVIL) 200 MG tablet Take 2 tablets (400 mg total) by mouth every 6 (six) hours as needed for mild pain. 06/02/21  Yes Barrett, Erin R, PA-C  lisinopril (ZESTRIL) 10 MG tablet Take 10 mg by mouth daily.   Yes [provider]  metFORMIN (GLUCOPHAGE) 500 MG tablet Take 1,000 mg by mouth daily with breakfast. 05/10/20  Yes [provider]  Multiple Vitamins-Minerals (CENTRUM SILVER 50+MEN) TABS Take 1 tablet by mouth daily.   Yes [provider]  oxyCODONE (OXY IR/ROXICODONE) 5 MG immediate release tablet Take 1 tablet (5 mg total) by mouth every 4 (four) hours as needed for moderate pain. 06/02/21  Yes Barrett, Erin R, PA-C  TRELEGY ELLIPTA 100-62.5-25 MCG/INH AEPB Inhale 1 puff into the lungs daily. 01/01/21  Yes [provider]    Physical Exam:  Constitutional: Elderly male who appears to be in no  acute distress at this Vitals:   07/03/21 0640 07/03/21 0641 07/03/21 0649 07/03/21 0650  BP: (!) 144/89   (!) 144/69  Pulse:  93  92  Resp:  13  16  Temp:   98 F (36.7 C) 98 F (36.7 C)  TempSrc:   Oral   SpO2:  100%  100%  Weight:      Height:       Eyes: Lids and conjunctive normal ENMT: Mucous membranes are moist. Posterior pharynx clear of any exudate or lesions.  Neck: normal, supple  Respiratory: Normal respiratory effort with decreased aeration noted on the right lower lung field.  No significant wheezes or crackles appreciated.  O2 saturations maintained on room air. Cardiovascular: Regular rate and rhythm, no murmurs / rubs / gallops. No extremity edema.  Abdomen: no tenderness, no masses palpated. Bowel sounds positive.  Musculoskeletal: no clubbing / cyanosis. No joint deformity upper and lower extremities.  Decreased range of motion of the left hand and arm due to weakness. Skin: Some bruising noted of the left upper  extremity Neurologic: CN 2-12 grossly intact. Sensation intact.  Muscle strength 5/5 on the right upper and lower extremities.  Muscle strength 4/5 in the left lower extremity and 2-3/5 in the left upper extremity. psychiatric: Normal judgment and insight. Alert and oriented x   Anxious mood.     Labs on Admission: I have personally reviewed following labs and imaging studies  CBC: Recent Labs  Lab 07/02/21 2003 07/02/21 2024  WBC 13.5*  --   NEUTROABS 8.5*  --   HGB 13.2 13.9  HCT 40.2 41.0  MCV 84.6  --   PLT 353  --    Basic Metabolic Panel: Recent Labs  Lab 07/02/21 2003 07/02/21 2024  NA 136 137  K 4.3 4.2  CL 103 102  CO2 23  --   GLUCOSE 133* 124*  BUN 12 12  CREATININE 0.70 0.70  CALCIUM 9.3  --    GFR: Estimated Creatinine Clearance: 102.6 mL/min (by C-G formula based on SCr of 0.7 mg/dL). Liver Function Tests: Recent Labs  Lab 07/02/21 2003  AST 20  ALT 20  ALKPHOS 82  BILITOT 0.5  PROT 7.7  ALBUMIN 3.7   No results for input(s): LIPASE, AMYLASE in the last 168 hours. No results for input(s): AMMONIA in the last 168 hours. Coagulation Profile: Recent Labs  Lab 07/02/21 2003  INR 1.1   Cardiac Enzymes: No results for input(s): CKTOTAL, CKMB, CKMBINDEX, TROPONINI in the last 168 hours. BNP (last 3 results) No results for input(s): PROBNP in the last 8760 hours. HbA1C: No results for input(s): HGBA1C in the last 72 hours. CBG: No results for input(s): GLUCAP in the last 168 hours. Lipid Profile: No results for input(s): CHOL, HDL, LDLCALC, TRIG, CHOLHDL, LDLDIRECT in the last 72 hours. Thyroid Function Tests: No results for input(s): TSH, T4TOTAL, FREET4, T3FREE, THYROIDAB in the last 72 hours. Anemia Panel: No results for input(s): VITAMINB12, FOLATE, FERRITIN, TIBC, IRON, RETICCTPCT in the last 72 hours. Urine analysis:    Component Value Date/Time   COLORURINE YELLOW 07/02/2021 0045   APPEARANCEUR HAZY (A) 07/02/2021 0045   LABSPEC  1.023 07/02/2021 0045   PHURINE 5.0 07/02/2021 0045   GLUCOSEU NEGATIVE 07/02/2021 0045   HGBUR NEGATIVE 07/02/2021 0045   BILIRUBINUR NEGATIVE 07/02/2021 0045   KETONESUR NEGATIVE 07/02/2021 0045   PROTEINUR NEGATIVE 07/02/2021 0045   NITRITE NEGATIVE 07/02/2021 0045   LEUKOCYTESUR TRACE (A) 07/02/2021 0045  Sepsis Labs: Recent Results (from the past 240 hour(s))  Resp Panel by RT-PCR (Flu A&B, Covid) Nasopharyngeal Swab     Status: None   Collection Time: 07/02/21  9:17 PM   Specimen: Nasopharyngeal Swab; Nasopharyngeal(NP) swabs in vial transport medium  Result Value Ref Range Status   SARS Coronavirus 2 by RT PCR NEGATIVE NEGATIVE Final    Comment: (NOTE) SARS-CoV-2 target nucleic acids are NOT DETECTED.  The SARS-CoV-2 RNA is generally detectable in upper respiratory specimens during the acute phase of infection. The lowest concentration of SARS-CoV-2 viral copies this assay can detect is 138 copies/mL. A negative result does not preclude SARS-Cov-2 infection and should not be used as the sole basis for treatment or other patient management decisions. A negative result may occur with  improper specimen collection/handling, submission of specimen other than nasopharyngeal swab, presence of viral mutation(s) within the areas targeted by this assay, and inadequate number of viral copies(<138 copies/mL). A negative result must be combined with clinical observations, patient history, and epidemiological information. The expected result is Negative.  Fact Sheet for Patients:  EntrepreneurPulse.com.au  Fact Sheet for Healthcare Providers:  IncredibleEmployment.be  This test is no t yet approved or cleared by the [place] FDA and  has been authorized for detection and/or diagnosis of SARS-CoV-2 by FDA under an Emergency Use Authorization (EUA). This EUA will remain  in effect (meaning this test can be used) for the duration of  the COVID-19 declaration under Section 564(b)(1) of the Act, 21 U.S.C.section 360bbb-3(b)(1), unless the authorization is terminated  or revoked sooner.       Influenza A by PCR NEGATIVE NEGATIVE Final   Influenza B by PCR NEGATIVE NEGATIVE Final    Comment: (NOTE) The Xpert Xpress SARS-CoV-2/FLU/RSV plus assay is intended as an aid in the diagnosis of influenza from Nasopharyngeal swab specimens and should not be used as a sole basis for treatment. Nasal washings and aspirates are unacceptable for Xpert Xpress SARS-CoV-2/FLU/RSV testing.  Fact Sheet for Patients: EntrepreneurPulse.com.au  Fact Sheet for Healthcare Providers: IncredibleEmployment.be  This test is not yet approved or cleared by the [place] FDA and has been authorized for detection and/or diagnosis of SARS-CoV-2 by FDA under an Emergency Use Authorization (EUA). This EUA will remain in effect (meaning this test can be used) for the duration of the COVID-19 declaration under Section 564(b)(1) of the Act, 21 U.S.C. section 360bbb-3(b)(1), unless the authorization is terminated or revoked.  Performed at [facility], [place] [address] [place], [place] [address]      Radiological Exams on Admission: CT HEAD W & WO CONTRAST (5MM)  Result Date: 07/03/2021 CLINICAL DATA:  66 year old male with multiple falls. Neurologic deficit. Left extremity numbness for 2 days. Cavitary right lung lesion suspicious for lung cancer in October. EXAM: CT HEAD WITHOUT AND WITH CONTRAST TECHNIQUE: Contiguous axial images were obtained from the base of the skull through the vertex without and with intravenous contrast. RADIATION DOSE REDUCTION: This exam was performed according to the departmental dose-optimization program which includes automated exposure control, adjustment of the mA and/or kV according to patient size and/or use of iterative reconstruction technique. CONTRAST:  83mL OMNIPAQUE  IOHEXOL 300 MG/ML  SOLN COMPARISON:  PET-CT 04/04/2021. FINDINGS: Brain: Multifocal cerebral vasogenic edema. Anterior left frontal lobe 3 cm rim enhancing mass (series 7, image 21) with regional vasogenic edema. Posterior right superior frontal gyrus 2.6 cm rim enhancing mass with regional vasogenic edema. This lesion is perirolandic, with enhancement and edema  subsequently affecting the left extremity motor and sensory areas. Mass effect on the lateral ventricles, but no midline shift. Basilar cisterns remain patent. No ventriculomegaly. No other No abnormal enhancement identified. No acute intracranial hemorrhage identified. No cortically based acute infarct identified. Vascular: Mild Calcified atherosclerosis at the skull base. Major intracranial vascular structures are enhancing as expected and appear to be patent. Skull: No acute or suspicious osseous lesion identified. Sinuses/Orbits: Visualized paranasal sinuses and mastoids are clear. Other: Postoperative changes to both globes. No acute orbit or scalp soft tissue finding. IMPRESSION: 1. Positive for Metastatic Disease to the brain. Two rim enhancing masses, up to 3 cm diameter, with vasogenic edema. Superior Right Perirolandic Cortex and anterior left frontal lobe affected. Mass effect on the lateral ventricles but no midline shift and basilar cisterns remain normal. 2. Brain MRI without and with contrast might detect additional lesions. Electronically Signed   By: Genevie Ann M.D.   On: 07/03/2021 05:59   DG Chest Port 1 View  Result Date: 07/03/2021 CLINICAL DATA:  Left arm and leg numbness.  Multiple falls. EXAM: PORTABLE CHEST 1 VIEW COMPARISON:  06/14/2021. FINDINGS: The heart size and mediastinal contours are within normal limits. Atherosclerotic calcification of the aorta is noted. There is a persistent small to moderate right pleural effusion with mild atelectasis at the right lung base. The left lung is clear. No pneumothorax. Shoulder  arthroplasty changes are noted on the right. IMPRESSION: Persistent small to moderate right pleural effusion, not significantly changed from the prior exam. Electronically Signed   By: Brett Fairy M.D.   On: 07/03/2021 04:56    EKG: Independently reviewed.  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  CT scan of the brain noted concern for metastatic disease with 2 rim-enhancing masses up to 3 cm in diameter with vasogenic edema and mass-effect on lateral ventricles without midline shift.  Neurology recommended patient be started on Decadron 10 mg IV x1 dose, and then continue on 4 mg p.o. q. 8 hours.  MRI of the brain with and without contrast was ordered.  Suspect primary likely invasive squamous cell carcinoma of the lung. -Admit to a progressive bed -Seizure precautions -Neurochecks -Follow-up MRI of the brain with and without contrast.  Dr. Christella Noa recommended admission for MRI was done with the 3 Tesla magnet without without radiation oncology -Neurosurgery -Physical therapy to evaluate -Transitions of care consulted -[place] neurology consultative services -Dr. Curt Bears added to the care team.  Discussed case with Dr.Iruku recommended checking CT scans of the chest abdomen and pelvis for staging. -Dr. Christella Noa of neurosurgery consulted, and recommended increasing Decadron to every 6 hours for which orders were adjusted.  Leukocytosis: Acute.  WBC elevated at 13.5.  Suspect secondary to above.  Patient does report urinary frequency, but suspect symptoms likely secondary to BPH. -Check urinalysis  Squamous cell carcinoma of the lung: Patient status post robotic resection of the right lower lobe on 05/20/2021 with Dr. Roxan Hockey.  Biopsies revealed invasive squamous cell carcinoma.   Thought to be stage Ib at that time as lymph nodes that were resected were negative.  He had followed up with Dr. Julien Nordmann and there was no plans for chemo or radiation at that time.  Chest x-ray from today noted small to moderate right-sided pleural effusion unchanged from previous on 1/3.   -Dr. Earlie Server added to  the care team  Essential hypertension: Blood pressures currently 114/83 144/89.  Home blood pressure medications include lisinopril 10 mg daily. -Continue lisinopril -Metoprolol IV as needed for elevated blood pressures  Diabetes mellitus type 2: Last hemoglobin A1c 6.2 on 05/18/2021.  Home medication regimen includes metformin 1000 mg daily. -Hypoglycemic protocols -Hold metformin -CBGs before every meal with sensitive SSI while on high-dose steroid -Adjust regimen as needed  COPD, without exacerbation: Patient denies having any significant cough or wheezing.  On physical exam lung sounds clear.  Home medication regimen includes trilogy Ellipta inhaler as needed. -Continue trilogy Ellipta and albuterol nebs as needed for shortness of breath/wheezing  Tobacco abuse: Patient has an approximate 49 smoking pack year history.  He states that he quit smoking 05/19/2021. -Continue to encourage cessation of tobacco use  BPH: Patient reports having urinary frequency.  Previously advised to start on what sounds like Flomax, but did not understand of the medication was to work. After discussion with patient he reports wanting to try this medicine. -Flomax 0.4 mg daily   DVT prophylaxis: lovenox Code Status: Full Family Communication: Patient declined at this time Disposition Plan: To be determined Consults called: Neurology, neurosurgery, Admission status: Inpatient, require more than 2 midnight stay in the setting of metastatic brain lesion with weakness  Norval Morton MD [facility]   If 7PM-7AM, please contact night-coverage   07/03/2021, 7:20 AM

## 2021-07-03 NOTE — Assessment & Plan Note (Signed)
Patient presented with complaints of progressively worsening left-sided weakness over the last 3-4 weeks.  CT scan of the brain noted concern for metastatic disease with 2 rim-enhancing masses up to 3 cm in diameter with vasogenic edema and mass-effect on lateral ventricles without midline shift.  Neurology recommended patient be started on Decadron 10 mg IV x1 dose, and then continue on 4 mg p.o. q. 8 hours.  MRI of the brain with and without contrast was ordered.  Suspect primary likely invasive squamous cell carcinoma of the lung. -Admit to a progressive bed -Seizure precautions -Neurochecks -Follow-up MRI of the brain with and without contrast -Continue Decadron 4 mg p.o. 3 times daily -Physical therapy to evaluate -Transitions of care consulted -[place] neurology consultative services -Dr. Curt Bears added to the care team -Neurosurgery consulted, will follow-up for any additional recommendations.

## 2021-07-04 ENCOUNTER — Other Ambulatory Visit: Payer: Self-pay

## 2021-07-04 ENCOUNTER — Ambulatory Visit
Admission: RE | Admit: 2021-07-04 | Discharge: 2021-07-04 | Disposition: A | Discharge status: Home / Self Care | Payer: Medicare Other | Source: Ambulatory Visit | Attending: Radiation Oncology | Admitting: Radiation Oncology

## 2021-07-04 ENCOUNTER — Other Ambulatory Visit: Payer: Self-pay | Admitting: Radiation Oncology

## 2021-07-04 ENCOUNTER — Encounter: Payer: Self-pay | Admitting: *Deleted

## 2021-07-04 DIAGNOSIS — C7931 Secondary malignant neoplasm of brain: Secondary | ICD-10-CM

## 2021-07-04 DIAGNOSIS — E43 Unspecified severe protein-calorie malnutrition: Secondary | ICD-10-CM | POA: Diagnosis present

## 2021-07-04 LAB — CBC
HCT: 35.1 % — ABNORMAL LOW (ref 39.0–52.0)
Hemoglobin: 11.5 g/dL — ABNORMAL LOW (ref 13.0–17.0)
MCH: 27.1 pg (ref 26.0–34.0)
MCHC: 32.8 g/dL (ref 30.0–36.0)
MCV: 82.8 fL (ref 80.0–100.0)
Platelets: 272 10*3/uL (ref 150–400)
RBC: 4.24 MIL/uL (ref 4.22–5.81)
RDW: 13.7 % (ref 11.5–15.5)
WBC: 10.2 10*3/uL (ref 4.0–10.5)
nRBC: 0 % (ref 0.0–0.2)

## 2021-07-04 LAB — BASIC METABOLIC PANEL
Anion gap: 7 (ref 5–15)
BUN: 17 mg/dL (ref 8–23)
CO2: 24 mmol/L (ref 22–32)
Calcium: 8.8 mg/dL — ABNORMAL LOW (ref 8.9–10.3)
Chloride: 102 mmol/L (ref 98–111)
Creatinine, Ser: 0.53 mg/dL — ABNORMAL LOW (ref 0.61–1.24)
GFR, Estimated: >60 mL/min (ref >60)
Glucose, Bld: 156 mg/dL — ABNORMAL HIGH (ref 70–99)
Potassium: 3.9 mmol/L (ref 3.5–5.1)
Sodium: 133 mmol/L — ABNORMAL LOW (ref 135–145)

## 2021-07-04 LAB — GLUCOSE, CAPILLARY
Glucose-Capillary: 187 mg/dL — ABNORMAL HIGH (ref 70–99)
Glucose-Capillary: 187 mg/dL — ABNORMAL HIGH (ref 70–99)
Glucose-Capillary: 222 mg/dL — ABNORMAL HIGH (ref 70–99)

## 2021-07-04 LAB — HIV ANTIBODY (ROUTINE TESTING W REFLEX): HIV Screen 4th Generation wRfx: NONREACTIVE

## 2021-07-04 MED ORDER — POLYETHYLENE GLYCOL 3350 17 G PO PACK
17.0000 g | PACK | Freq: Every day | ORAL | Status: DC | PRN
  Administered 2021-07-04: 17 g via ORAL
  Filled 2021-07-04: qty 1

## 2021-07-04 MED ORDER — GLUCERNA SHAKE PO LIQD
237.0000 mL | Freq: Two times a day (BID) | ORAL | Status: DC
  Administered 2021-07-04 – 2021-07-05 (×2): 237 mL via ORAL
  Filled 2021-07-04 (×3): qty 237

## 2021-07-04 MED ORDER — ENSURE ENLIVE PO LIQD
237.0000 mL | ORAL | Status: DC
  Administered 2021-07-05: 13:00:00 237 mL via ORAL

## 2021-07-04 NOTE — Progress Notes (Signed)
Oncology Nurse Navigator Documentation  Oncology Nurse Navigator Flowsheets 07/04/2021 06/21/2021 06/14/2021  Abnormal Finding Date - 04/22/2021 -  Confirmed Diagnosis Date - 05/20/2021 -  Diagnosis Status - Confirmed Diagnosis Complete -  Planned Course of Treatment - Surgery -  Phase of Treatment - Surgery -  Surgery Actual Start Date: - 05/20/2021 -  Navigator Follow Up Date: 07/05/2021 - 06/21/2021  Navigator Follow Up Reason: Pathology - New Patient Appointment  Navigator Location [facility]  Referral Date to RadOnc/MedOnc - - 06/14/2021  Navigator Encounter Type Inpatient Clinic/MDC Other:  Treatment Initiated Date - 05/20/2021 -  Patient Visit Type - Initial;MedOnc Other  Treatment Phase - Follow-up Other  Barriers/Navigation Needs Coordination of Care/I received a message from Mikey Bussing NP that patient is in the hospital.  I updated Dr. Julien Nordmann to see if he would like PDL 1 testing on recent path and when he would like to see patient back.   Education Coordination of Care  Education - Newly Diagnosed Cancer Education;Other -  Interventions Coordination of Care Education;Psycho-Social Support Coordination of Care  Acuity Level 2-Minimal Needs (1-2 Barriers Identified) Level 3-Moderate Needs (3-4 Barriers Identified) Level 2-Minimal Needs (1-2 Barriers Identified)  Coordination of Care Other - Other  Education Method - Verbal;Written -  Time Spent with Patient [phone]

## 2021-07-04 NOTE — ED Notes (Signed)
I found Mr. Blacksher medicine here in the ED and have given it to Pharmacy.  They will get it to him.   Thought I'd let you know in case he was concerned.   Thank you, Haig Prophet Admin asst in [facility]

## 2021-07-04 NOTE — Progress Notes (Signed)
Initial Nutrition Assessment  DOCUMENTATION CODES:   Severe malnutrition in context of chronic illness  INTERVENTION:  - continue Glucerna Shake BID, each supplement provides 220 kcal and 10 grams of protein. - will order Ensure Plus High Protein once/day, each supplement provides 350 kcal and 20 grams of protein.    NUTRITION DIAGNOSIS:   Severe Malnutrition related to chronic illness, cancer and cancer related treatments as evidenced by severe fat depletion, severe muscle depletion.  GOAL:   Patient will meet greater than or equal to 90% of their needs  MONITOR:   PO intake, Supplement acceptance, Labs, Weight trends  REASON FOR ASSESSMENT:   Malnutrition Screening Tool  ASSESSMENT:   66 y.o. 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 He presented to the ED with worsening L-sided weakness and falls. He was seen in the ED on 06/24/21 for the same and plan was for outpatient Neurology follow-up.  Patient laying in bed at the time of RD visit. He denies any changes in appetite PTA. He shares that his UBW is 195 lb and that he last weighed this at the beginning of December.  He confirms that he lives alone. He has a neighbor he has known for many years who he is able to call/depend on if he needs anything.   He is R handed and reports that progressive loss of L arm function and increasing L leg weakness has been ongoing since the time of his lobectomy in December. He has been experiencing overall weakness and several falls.   Weight yesterday was 185 lb and weight on 05/24/21 was documented as UBW of 195 lb. This indicates 10 lb weight loss (5% body weight) in the past 5 weeks.   Patient has been drinking Glucerna Shakes and shares that he enjoys them.    Labs reviewed; CBG: 187 mg/dl, Na: 133 mmol/l, creatinine: 0.53 mg/dl.  Medications reviewed; sliding scale novolog.     NUTRITION - FOCUSED PHYSICAL EXAM:  Flowsheet Row Most Recent Value  Orbital Region Moderate depletion  Upper Arm Region Severe depletion  Thoracic and Lumbar Region Unable to assess  Buccal Region Severe depletion  Temple Region Moderate depletion  Clavicle Bone Region Severe depletion  Clavicle and Acromion Bone Region Severe depletion  Scapular Bone Region Moderate depletion  Dorsal Hand Mild depletion  Patellar Region Severe depletion  Anterior Thigh Region Moderate depletion  Posterior Calf Region Moderate depletion  Edema (RD Assessment) None  Hair Reviewed  Eyes Reviewed  Mouth Reviewed  Skin Reviewed  Nails Reviewed       Diet Order:   Diet Order             Diet Carb Modified Fluid consistency: Thin; Room service appropriate? Yes  Diet effective now                   EDUCATION NEEDS:   Education needs have been addressed  Skin:  Skin Assessment: Reviewed RN Assessment  Last BM:  PTA/unknown  Height:   Ht Readings from Last 1 Encounters:  07/03/21 6\' 1"  (1.854 m)    Weight:   Wt Readings from Last 1 Encounters:  07/03/21 83.8 kg     Estimated Nutritional Needs:  Kcal:  2200-2400 kcal Protein:  110-125g rams Fluid:  >/= 2.3 L/day     Jarome Matin, MS, RD, LDN Inpatient Clinical Dietitian RD pager # available in [facility]  After hours/weekend pager # available in [facility]

## 2021-07-04 NOTE — Progress Notes (Addendum)
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  Patient also reports some weakness of his left leg as well with recurrent falls.  Patient currently lives alone has been having difficulty getting around and performing his ADLs.  Patient has an aunt who lives locally but is unable to drive and his daughter lives out of state.  Patient denies fever, no headache, no chest pain, no shortness of breath, no cough, no nausea cefonicid diarrhea, no abdominal pain.   Patient was recently seen at [facility], ED on 06/24/2021, case was discussed with neurology who felt patient could have outpatient follow-up; and imaging of the brain was not performed at that time.  Patient reports he quit smoking 05/19/2021, previously smoked for over 49 years anywhere from 1 to 1.5 packs/day on average.  In the ED, temperature 98.0 F, HR 101, RR 16, BP 114/83, SPO2 97% on room air.  Sodium 136, potassium 4.3, chloride 103, CO2 23, glucose 133, BUN 12, creatinine 0.70.  AST 20, ALT 20, total bilirubin 0.5.  WBC 13.5, hemoglobin 13.2, platelets 353.  Urinalysis unrevealing.  EtOH level less than 10.  UDS positive for THC.  CT chest/abdomen/pelvis with status post right lower lobectomy, no evidence of lymphadenopathy or metastatic disease process in the chest/abdomen/pelvis, clustered central lobar and tree-in-bud groundglass opacities bilateral lung bases.  MR brain with and without contrast with findings compatible with metastatic disease with 3 lesions, 2.8 cm left frontal lesion,  2.3 cm right precentral gyrus lesion with significant edema and local mass-effect, 3 mm lesion left precentral gyrus.  Neurology, neurosurgery were consulted with recommendations of initiation of steroids and transfer to [facility] [place] for radiation oncology evaluation.  Hospitalist service consulted for further evaluation management.     Assessment & Plan:   Principal Problem:   Metastasis to brain [facility]) Active Problems:   Chronic obstructive pulmonary disease (HCC)   Type 2 diabetes mellitus (HCC)   Squamous cell carcinoma of lung, stage I, right (HCC)   Essential hypertension   Tobacco abuse   Vasogenic edema (HCC)   Neoplasm causing mass effect on adjacent structures   BPH (benign prostatic hyperplasia)   Leukocytosis   Left-sided weakness   Right lung squamous cell carcinoma s/p right lower lobe robotic lobectomy Brain metastases Patient presenting to ED with progressive left upper/lower extremity weakness over the past several weeks.  Weakness has progressed in his left upper extremity with inability to utilize for ADLs.  MRI brain with and without contrast notable for 3 metastatic lesions within the left frontal area, right and left precentral gyrus.  Was evaluated by neurosurgery, Dr. Christella Noa and neurology, Dr. Leonel Ramsay with recommendations of initiation of steroids and transfer to [facility] for radiation oncology evaluation. --Decadron 4 mg p.o. every 6 hours --Awaiting radiation oncology evaluation; discussed with Dr. Lisbeth Renshaw this a.m.  Left upper/lower extremity weakness Etiology likely secondary to brain metastases. --Continue steroids as above, likely will need radiation -- T recommends left arm sling, 3 and 1 bedside commode, tub bench --PTevaluation: Pending  Leukocytosis: Resolved CBC elevated  at 13.5.  No infectious etiology elucidated.  Likely secondary to reactive versus dehydration.  Now resolved.  Essential hypertension --Lisinopril 10 mg  p.o. daily  Type 2 diabetes mellitus Hemoglobin A1c 6.2 on 05/18/2021.  Home regimen includes metformin 1000 mg p.o. daily. --Hold oral hypoglycemics while inpatient --SSI for coverage --CBGs before every meal/at bedtime  COPD, without exacerbation: Home regimen includes Trelegy Ellipta.  Not oxygen dependent at baseline.  On room air. --Continue Trelegy Ellipta --Albuterol nebs as needed shortness of breath/wheezing  Tobacco use disorder: Patient with approximate 49 smoking pack-year history, quit smoking 05/19/2021.  Congratulated on tobacco cessation. --Continue to encourage avoidance of tobacco use.  BPH: --Tamsulosin 0.4 mg p.o. daily   DVT prophylaxis: enoxaparin (LOVENOX) injection 40 mg Start: 07/03/21 1800   Code Status: Full Code Family Communication: No family present at bedside this morning  Disposition Plan:  Level of care: Progressive Status is: Inpatient  Remains inpatient appropriate because: Awaiting radiation oncology evaluation, PT/OT evaluation    Consultants:  Neurology, Dr. Leonel Ramsay Neurosurgery, Dr. Christella Noa Radiation oncology, Dr. Lisbeth Renshaw  Procedures:  None  Antimicrobials:  None   Subjective: Patient seen examined bedside, resting comfortably.  He is with left upper greater than left lower extremity weakness, relative inability to utilize left upper extremity for any ADLs.  Discussed with patient findings of brain lesions and awaiting radiation oncology evaluation.  No other questions or concerns at this time.  Denies headache, no fever/chills/night sweats, no visual changes, no chest pain, no palpitations, no shortness of breath, no abdominal pain, no weakness, no fatigue, no paresthesias.  No acute events overnight per nursing staff.  Objective: Vitals:   07/03/21 2330 07/04/21 0258 07/04/21 1028 07/04/21 1120  BP: 106/76 111/78  101/73  Pulse: 92 83  (!) 106  Resp: 18 20  16   Temp: 98 F (36.7 C) 97.8 F (36.6 C)  (!) 97.5 F (36.4 C)   TempSrc: Oral Oral  Oral  SpO2: 97% 95% 95% 97%  Weight:      Height:        Intake/Output Summary (Last 24 hours) at 07/04/2021 1142 Last data filed at 07/04/2021 0500 Gross per 24 hour  Intake 1157.67 ml  Output 2600 ml  Net -1442.33 ml   Filed Weights   07/02/21 1953 07/03/21 1519  Weight: 85.3 kg 83.8 kg    Examination:  General exam: Appears calm and comfortable  Respiratory system: Decreased breath sounds right lung base, otherwise clear to auscultation. Respiratory effort normal.  On room air Cardiovascular system: S1 & S2 heard, RRR. No JVD, murmurs, rubs, gallops or clicks. No pedal edema. Gastrointestinal system: Abdomen is nondistended, soft and nontender. No organomegaly or masses felt. Normal bowel sounds heard. Central nervous system: Alert and oriented.  Extremities: Symmetric 5 x 5 power. Skin: No rashes, lesions or ulcers Psychiatry: Judgement and insight appear normal. Mood & affect appropriate.   Neuro Exam Mental Status: A&O x4, no dysarthria, no aphasia Cranial Nerves: visual fields full, PERRL, EOMi, intact smooth pursuit, no nystagmus, no ptosis, facial sensation intact bilaterally, 5/5 jaw strength, nasolabial fold & smile symetric,  eyebrow  raise & 5/5 eye closure symetric, hearing symmetric and normal to rubbing fingers, palate elevates symmetrically, head turning and shoulder shrug intact and symetric bilaterally, tongue protrusion is midline Motor: no pronator drift, LUE 1/5,   LLE 4/5,   RUE 5/5,   RLE 5/5   normal muscle bulk no significant atrophy, normal tone, no spasticity or rigidity apperciated  Sensory: Sensation is intact to light touch, temp, pain, vibration, joint position sense in all 4 ext Coordination/Movement: no tremor noted, no dysmetria  Data Reviewed: I have personally reviewed following labs and imaging studies  CBC: Recent Labs  Lab 07/02/21 2003 07/02/21 2024 07/04/21 0352  WBC 13.5*  --  10.2  NEUTROABS 8.5*  --   --    HGB 13.2 13.9 11.5*  HCT 40.2 41.0 35.1*  MCV 84.6  --  82.8  PLT 353  --  182   Basic Metabolic Panel: Recent Labs  Lab 07/02/21 2003 07/02/21 2024 07/04/21 0352  NA 136 137 133*  K 4.3 4.2 3.9  CL 103 102 102  CO2 23  --  24  GLUCOSE 133* 124* 156*  BUN 12 12 17   CREATININE 0.70 0.70 0.53*  CALCIUM 9.3  --  8.8*   GFR: Estimated Creatinine Clearance: 102.6 mL/min (A) (by C-G formula based on SCr of 0.53 mg/dL (L)). Liver Function Tests: Recent Labs  Lab 07/02/21 2003  AST 20  ALT 20  ALKPHOS 82  BILITOT 0.5  PROT 7.7  ALBUMIN 3.7   No results for input(s): LIPASE, AMYLASE in the last 168 hours. No results for input(s): AMMONIA in the last 168 hours. Coagulation Profile: Recent Labs  Lab 07/02/21 2003  INR 1.1   Cardiac Enzymes: No results for input(s): CKTOTAL, CKMB, CKMBINDEX, TROPONINI in the last 168 hours. BNP (last 3 results) No results for input(s): PROBNP in the last 8760 hours. HbA1C: No results for input(s): HGBA1C in the last 72 hours. CBG: Recent Labs  Lab 07/03/21 0853 07/03/21 1152 07/03/21 1608 07/04/21 1118  GLUCAP 152* 155* 211* 187*   Lipid Profile: No results for input(s): CHOL, HDL, LDLCALC, TRIG, CHOLHDL, LDLDIRECT in the last 72 hours. Thyroid Function Tests: No results for input(s): TSH, T4TOTAL, FREET4, T3FREE, THYROIDAB in the last 72 hours. Anemia Panel: No results for input(s): VITAMINB12, FOLATE, FERRITIN, TIBC, IRON, RETICCTPCT in the last 72 hours. Sepsis Labs: No results for input(s): PROCALCITON, LATICACIDVEN in the last 168 hours.  Recent Results (from the past 240 hour(s))  Resp Panel by RT-PCR (Flu A&B, Covid) Nasopharyngeal Swab     Status: None   Collection Time: 07/02/21  9:17 PM   Specimen: Nasopharyngeal Swab; Nasopharyngeal(NP) swabs in vial transport medium  Result Value Ref Range Status   SARS Coronavirus 2 by RT PCR NEGATIVE NEGATIVE Final    Comment: (NOTE) SARS-CoV-2 target nucleic acids are NOT  DETECTED.  The SARS-CoV-2 RNA is generally detectable in upper respiratory specimens during the acute phase of infection. The lowest concentration of SARS-CoV-2 viral copies this assay can detect is 138 copies/mL. A negative result does not preclude SARS-Cov-2 infection and should not be used as the sole basis for treatment or other patient management decisions. A negative result may occur with  improper specimen collection/handling, submission of specimen other than nasopharyngeal swab, presence of viral mutation(s) within the areas targeted by this assay, and inadequate number of viral copies(<138 copies/mL). A negative result must be combined with clinical observations, patient history, and epidemiological information. The expected result is Negative.  Fact Sheet for Patients:  EntrepreneurPulse.com.au  Fact Sheet for Healthcare Providers:  IncredibleEmployment.be  This test is no t yet approved or cleared by the [place] FDA and  has been authorized for detection and/or diagnosis of SARS-CoV-2 by FDA under an Emergency Use Authorization (EUA). This EUA will remain  in effect (meaning this test can be used) for the  duration of the COVID-19 declaration under Section 564(b)(1) of the Act, 21 U.S.C.section 360bbb-3(b)(1), unless the authorization is terminated  or revoked sooner.       Influenza A by PCR NEGATIVE NEGATIVE Final   Influenza B by PCR NEGATIVE NEGATIVE Final    Comment: (NOTE) The Xpert Xpress SARS-CoV-2/FLU/RSV plus assay is intended as an aid in the diagnosis of influenza from Nasopharyngeal swab specimens and should not be used as a sole basis for treatment. Nasal washings and aspirates are unacceptable for Xpert Xpress SARS-CoV-2/FLU/RSV testing.  Fact Sheet for Patients: EntrepreneurPulse.com.au  Fact Sheet for Healthcare Providers: IncredibleEmployment.be  This test is not yet  approved or cleared by the [place] FDA and has been authorized for detection and/or diagnosis of SARS-CoV-2 by FDA under an Emergency Use Authorization (EUA). This EUA will remain in effect (meaning this test can be used) for the duration of the COVID-19 declaration under Section 564(b)(1) of the Act, 21 U.S.C. section 360bbb-3(b)(1), unless the authorization is terminated or revoked.  Performed at [facility], [place] [address], [place], [place] [address]          Radiology Studies: CT HEAD W & WO CONTRAST (5MM)  Result Date: 07/03/2021 CLINICAL DATA:  66 year old male with multiple falls. Neurologic deficit. Left extremity numbness for 2 days. Cavitary right lung lesion suspicious for lung cancer in October. EXAM: CT HEAD WITHOUT AND WITH CONTRAST TECHNIQUE: Contiguous axial images were obtained from the base of the skull through the vertex without and with intravenous contrast. RADIATION DOSE REDUCTION: This exam was performed according to the departmental dose-optimization program which includes automated exposure control, adjustment of the mA and/or kV according to patient size and/or use of iterative reconstruction technique. CONTRAST:  36mL OMNIPAQUE IOHEXOL 300 MG/ML  SOLN COMPARISON:  PET-CT 04/04/2021. FINDINGS: Brain: Multifocal cerebral vasogenic edema. Anterior left frontal lobe 3 cm rim enhancing mass (series 7, image 21) with regional vasogenic edema. Posterior right superior frontal gyrus 2.6 cm rim enhancing mass with regional vasogenic edema. This lesion is perirolandic, with enhancement and edema subsequently affecting the left extremity motor and sensory areas. Mass effect on the lateral ventricles, but no midline shift. Basilar cisterns remain patent. No ventriculomegaly. No other No abnormal enhancement identified. No acute intracranial hemorrhage identified. No cortically based acute infarct identified. Vascular: Mild Calcified atherosclerosis at the skull base.  Major intracranial vascular structures are enhancing as expected and appear to be patent. Skull: No acute or suspicious osseous lesion identified. Sinuses/Orbits: Visualized paranasal sinuses and mastoids are clear. Other: Postoperative changes to both globes. No acute orbit or scalp soft tissue finding. IMPRESSION: 1. Positive for Metastatic Disease to the brain. Two rim enhancing masses, up to 3 cm diameter, with vasogenic edema. Superior Right Perirolandic Cortex and anterior left frontal lobe affected. Mass effect on the lateral ventricles but no midline shift and basilar cisterns remain normal. 2. Brain MRI without and with contrast might detect additional lesions. Electronically Signed   By: Genevie Ann M.D.   On: 07/03/2021 05:59   MR BRAIN W WO CONTRAST  Result Date: 07/03/2021 CLINICAL DATA:  Rule out metastatic disease. Squamous cell carcinoma lung. Abnormal head CT. EXAM: MRI HEAD WITHOUT AND WITH CONTRAST TECHNIQUE: Multiplanar, multiecho pulse sequences of the brain and surrounding structures were obtained without and with intravenous contrast. CONTRAST:  8.65mL GADAVIST GADOBUTROL 1 MMOL/ML IV SOLN COMPARISON:  CT head 07/03/2021 FINDINGS: Brain: 3 enhancing lesions in the brain are present compatible with metastatic disease. 2.8 cm  left frontal mass with peripheral enhancement and central necrosis. Minimal associated hemorrhage. Moderate surrounding edema with mild midline shift to the right. 2.3 cm lesion in the precentral gyrus on the right. There is central necrosis with internal hemorrhage. Moderate white matter edema and local mass effect. 3 mm enhancing lesion left precentral gyrus without significant edema compatible with metastatic disease. Ventricle size normal.  Negative for acute infarct. Vascular: Normal arterial flow voids Skull and upper cervical spine: Negative Sinuses/Orbits: Paranasal sinuses clear. Bilateral cataract extraction Other: None IMPRESSION: Findings compatible with  metastatic disease to brain. Three lesions are identified. Left frontal lesion and right precentral gyrus lesion show significant edema and local mass effect. 3 mm lesion left precentral gyrus. Electronically Signed   By: Franchot Gallo M.D.   On: 07/03/2021 11:36   CT CHEST ABDOMEN PELVIS W CONTRAST  Result Date: 07/03/2021 CLINICAL DATA:  Brain metastatic disease, status post right lower lobectomy EXAM: CT CHEST, ABDOMEN, AND PELVIS WITH CONTRAST TECHNIQUE: Multidetector CT imaging of the chest, abdomen and pelvis was performed following the standard protocol during bolus administration of intravenous contrast. RADIATION DOSE REDUCTION: This exam was performed according to the departmental dose-optimization program which includes automated exposure control, adjustment of the mA and/or kV according to patient size and/or use of iterative reconstruction technique. CONTRAST:  151mL OMNIPAQUE IOHEXOL 300 MG/ML SOLN, additional oral enteric contrast COMPARISON:  None. FINDINGS: CT CHEST FINDINGS Cardiovascular: Aortic atherosclerosis. Normal heart size. Three-vessel coronary artery calcifications and stents. No pericardial effusion. Mediastinum/Nodes: No enlarged mediastinal, hilar, or axillary lymph nodes. Thyroid gland, trachea, and esophagus demonstrate no significant findings. Lungs/Pleura: Status post right lower lobectomy. Trace, loculated right pleural effusion, with a small, loculated air component superiorly and dependently (series 5, image 86). Moderate centrilobular emphysema. Clustered centrilobular and tree-in-bud ground-glass opacities in the bilateral lung bases (series 5, image 126). No pleural effusion or pneumothorax. Musculoskeletal: No chest wall mass or suspicious osseous lesions identified. CT ABDOMEN PELVIS FINDINGS Hepatobiliary: No solid liver abnormality is seen. Faintly rim calcified gallstone in the gallbladder. No gallbladder wall thickening, or biliary dilatation. Pancreas:  Unremarkable. No pancreatic ductal dilatation or surrounding inflammatory changes. Spleen: Normal in size without significant abnormality. Adrenals/Urinary Tract: Adrenal glands are unremarkable. Kidneys are normal, without renal calculi, solid lesion, or hydronephrosis. Bladder is unremarkable. Stomach/Bowel: Stomach is within normal limits. Appendix appears normal. No evidence of bowel wall thickening, distention, or inflammatory changes. Descending and sigmoid diverticulosis. Vascular/Lymphatic: Aortic atherosclerosis. No enlarged abdominal or pelvic lymph nodes. Reproductive: No mass or other abnormality. Other: No abdominal wall hernia or abnormality. No ascites. Musculoskeletal: No acute osseous findings. IMPRESSION: 1. Status post right lower lobectomy. 2. No evidence of lymphadenopathy or metastatic disease in the chest, abdomen, or pelvis. Please note, that in order to ensure subspecialty interpretation, restaging examinations for known malignancies should generally be deferred to the nonemergent, outpatient setting. 3. Clustered centrilobular and tree-in-bud ground-glass opacities in the bilateral lung bases. Findings suggest atypical infection or aspiration. 4. Cholelithiasis. 5. Coronary artery disease. Aortic Atherosclerosis (ICD10-I70.0) and Emphysema (ICD10-J43.9). Electronically Signed   By: Delanna Ahmadi M.D.   On: 07/03/2021 13:29   DG Chest Port 1 View  Result Date: 07/03/2021 CLINICAL DATA:  Left arm and leg numbness.  Multiple falls. EXAM: PORTABLE CHEST 1 VIEW COMPARISON:  06/14/2021. FINDINGS: The heart size and mediastinal contours are within normal limits. Atherosclerotic calcification of the aorta is noted. There is a persistent small to moderate right pleural effusion with mild atelectasis at the right  lung base. The left lung is clear. No pneumothorax. Shoulder arthroplasty changes are noted on the right. IMPRESSION: Persistent small to moderate right pleural effusion, not  significantly changed from the prior exam. Electronically Signed   By: Brett Fairy M.D.   On: 07/03/2021 04:56        Scheduled Meds:  dexamethasone  4 mg Oral Q6H   enoxaparin (LOVENOX) injection  40 mg Subcutaneous Q24H   feeding supplement (GLUCERNA SHAKE)  237 mL Oral BID BM   umeclidinium bromide  1 puff Inhalation Daily   And   fluticasone furoate-vilanterol  1 puff Inhalation Daily   insulin aspart  0-9 Units Subcutaneous TID WC   lisinopril  10 mg Oral Daily   sodium chloride flush  3 mL Intravenous Q12H   tamsulosin  0.4 mg Oral QPC breakfast   Continuous Infusions:   LOS: 1 day    Time spent: 45 minutes spent on chart review, discussion with nursing staff, consultants, updating family and interview/physical exam; more than 50% of that time was spent in counseling and/or coordination of care.    Jadaya Sommerfield J [place] DO [facility] Available via Epic secure chat 7am-7pm After these hours, please refer to coverage provider listed on [email or web address] 07/04/2021, 11:42 AM

## 2021-07-04 NOTE — Evaluation (Signed)
Physical Therapy Evaluation Patient Details Name: Stephen Diaz MRN: [id number] DOB: [birth year] Today's Date: 07/04/2021  History of Present Illness   Pt is a 66 year old male presented to ED with progressively worsening L sided weakness. CT scan of brain significant for 2g 3 masses up to 3 cm in diameter with vasogenic edema present mass effect on the lateral ventricles, but no midline shift appreciated. 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 male admitted with above HPI and diagnosis. Patient is currently limited by functional impairments below (see PT problem list). Patient lives alone and is independent at baseline. Patient having difficulty due to new Lt sided weakness with significant weakness in Lt Ue compared to Lt LE. Pt with repeated falls in last month due to poor balance. Overall he requires min guard/assist for gait with single UE support. He has intermittent assist from friends/neighbors and wishes to return home when medically ready. Patient will benefit from continued skilled PT interventions to address impairments and progress independence with mobility, recommending HHPT. Acute PT will follow and progress as able.        Recommendations for follow up therapy are one component of a multi-disciplinary discharge planning process, led by the attending physician.  Recommendations may be updated based on patient status, additional functional criteria and insurance authorization.    07/04/21 0900  PT Visit Information  Last PT Received On 07/04/21  Assistance Needed +1  History of Present Illness Pt is a 66 year old male presented to ED with progressively worsening L sided weakness. CT scan of brain significant for 2g 3 masses up to 3 cm in diameter with vasogenic edema present mass effect on the  lateral ventricles, but no midline shift appreciated. 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/20/21; hypertension, COPD, diabetes mellitus type 2, tobacco abuse  Precautions  Precautions Fall  Precaution Comments 5 falls in last month  Restrictions  Weight Bearing Restrictions No  Home Living  Family/patient expects to be discharged to: Private residence  Living Arrangements Alone  Available Help at Discharge Friend(s)  Type of [place] to enter  Entrance Stairs-Number of Steps 1  Entrance Stairs-Rails None  Home Layout One level  Bathroom Biomedical scientist Yes  Home Equipment None  Prior Function  Prior Level of Function  Independent/Modified Independent  Mobility Comments pt ambulating with no device  ADLs Comments MOD I- I n all ADL, pt reports falls  Communication  Communication No difficulties  Pain Assessment  Pain Assessment No/denies pain  Cognition  Arousal/Alertness Awake/alert  Behavior During Therapy WFL for tasks assessed/performed  Overall Cognitive Status Within Functional Limits for tasks assessed  Upper Extremity Assessment  Upper Extremity Assessment Defer to OT evaluation  Lower Extremity Assessment  Lower Extremity Assessment Overall WFL for tasks assessed;LLE deficits/detail  LLE Deficits / Details weakness in dorsiflexion and hip flexors noted, overall 4 to 4-/5 throughout but strength remains functional.  Cervical / Trunk Assessment  Cervical / Trunk Assessment Normal  Bed Mobility  Overal bed mobility Modified Independent  General bed mobility comments extra time, HOB elevated  Transfers  Overall transfer level Needs assistance  Equipment used None  Transfers Sit to/from Stand  Sit to Stand Supervision  General transfer comment sueprvision for safety. no Lob on rise  Ambulation/Gait  Ambulation/Gait  assistance Min assist  Gait Distance (Feet) 100 Feet  Assistive device 1 person hand held assist  Gait Pattern/deviations Step-through pattern;Decreased stance time - left;Decreased dorsiflexion - left  General Gait Details decreased hip/knee flexion on Lt LE but no LOB or buckling noted  Gait velocity fair  Balance  Overall balance assessment Needs assistance  Sitting-balance support Feet supported  Sitting balance-Leahy Scale Good  Standing balance support Reliant on assistive device for balance;During functional activity;Bilateral upper extremity supported  Standing balance-Leahy Scale Fair  PT - End of Session  Equipment Utilized During Treatment Gait belt  Activity Tolerance Patient tolerated treatment well  Patient left in chair;with call bell/phone within reach  Nurse Communication Mobility status  PT Assessment  PT Recommendation/Assessment Patient needs continued PT services  PT Visit Diagnosis Muscle weakness (generalized) (M62.81);Difficulty in walking, not elsewhere classified (R26.2);Unsteadiness on feet (R26.81)  PT Problem List Decreased strength;Decreased activity tolerance;Decreased balance;Decreased mobility;Decreased knowledge of use of DME;Decreased safety awareness;Decreased knowledge of precautions  PT Plan  PT Frequency (ACUTE ONLY) Min 3X/week  PT Treatment/Interventions (ACUTE ONLY) DME instruction;Stair training;Gait training;Functional mobility training;Therapeutic activities;Therapeutic exercise;Balance training;Neuromuscular re-education;Patient/family education  AM-PAC PT "6 Clicks" Mobility Outcome Measure (Version 2)  Help needed turning from your back to your side while in a flat bed without using bedrails? 3  Help needed moving from lying on your back to sitting on the side of a flat bed without using bedrails? 3  Help needed moving to and from a bed to a chair (including a wheelchair)? 3  Help needed standing up from a chair using your arms (e.g.,  wheelchair or bedside chair)? 3  Help needed to walk in hospital room? 3  Help needed climbing 3-5 steps with a railing?  3  6 Click Score 18  Consider Recommendation of Discharge To: Home with [facility]  Progressive Mobility  What is the highest level of mobility based on the progressive mobility assessment? Level 5 (Walks with assist in room/hall) - Balance while stepping forward/back and can walk in room with assist - Complete  Activity Ambulated with assistance in hallway  PT Recommendation  Follow Up Recommendations Home health PT  Assistance recommended at discharge PRN  Functional Status Assessment Patient has had a recent decline in their functional status and demonstrates the ability to make significant improvements in function in a reasonable and predictable amount of time.  PT equipment  (TBA)  Individuals Consulted  Consulted and Agree with Results and Recommendations Patient  Acute Rehab PT Goals  Patient Stated Goal get home  PT Goal Formulation With patient  Time For Goal Achievement 07/11/21  Potential to Achieve Goals Good  PT Time Calculation  PT Start Time (ACUTE ONLY) 0900  PT Stop Time (ACUTE ONLY) 0927  PT Time Calculation (min) (ACUTE ONLY) 27 min  PT General Charges  $$ ACUTE PT VISIT 1 Visit  PT Evaluation  $PT Eval Low Complexity 1 Low  PT Treatments  $Gait Training 8-22 mins  Written Expression  Dominant Hand Right    Verner Mould, DPT Acute Rehabilitation Services Office [phone] Pager [phone]   Jacques Navy 07/04/2021, 5:12 PM

## 2021-07-04 NOTE — Progress Notes (Signed)
Spoke with the patients daughter Sharyn Lull to discuss MRI findings, review the rationale for stereotactic radiosurgery on the side effects risks short and long-term effects of therapy as well as long-term expectations of surveillance. Shes in agreement with this plan and she has our contact information if she has questions or concerns regarding our plan for treatment.

## 2021-07-04 NOTE — TOC Initial Note (Signed)
Transition of Care [facility]) - Initial/Assessment Note    Patient Details  Name: Stephen Diaz MRN: [id number] Date of Birth: 09/23/1955  Transition of Care [facility]) CM/SW Contact:    Leeroy Cha, RN Phone Number: 07/04/2021, 8:34 AM  Clinical Narrative:                 Left sided weakness and recent falls/hx of lung ca and brain mets.   Transition of Care [facility]) Screening Note   Patient Details  Name: Stephen Diaz Date of Birth: Jan 23, 1956   Transition of Care [facility]) CM/SW Contact:    Leeroy Cha, RN Phone Number: 07/04/2021, 8:35 AM    Transition of Care Department [facility]) has reviewed patient and no TOC needs have been identified at this time. We will continue to monitor patient advancement through interdisciplinary progression rounds. If new patient transition needs arise, please place a TOC consult.    Expected Discharge Plan: [place] Barriers to Discharge: Continued Medical Work up   Patient Goals and CMS Choice Patient states their goals for this hospitalization and ongoing recovery are:: to go home CMS Medicare.gov Compare Post Acute Care list provided to:: Patient    Expected Discharge Plan and Services Expected Discharge Plan: [place]   Discharge Planning Services: CM Consult   Living arrangements for the past 2 months: Single Family Home                                      Prior Living Arrangements/Services Living arrangements for the past 2 months: Single Family Home Lives with:: Self (widow) Patient language and need for interpreter reviewed:: Yes Do you feel safe going back to the place where you live?: Yes            Criminal Activity/Legal Involvement Pertinent to Current Situation/Hospitalization: No - Comment as needed  Activities of Daily Living Home Assistive Devices/Equipment: None ADL Screening (condition at time of admission) Patient's cognitive ability adequate to safely  complete daily activities?: Yes Is the patient deaf or have difficulty hearing?: No Does the patient have difficulty seeing, even when wearing glasses/contacts?: No Does the patient have difficulty concentrating, remembering, or making decisions?: No Patient able to express need for assistance with ADLs?: Yes Does the patient have difficulty dressing or bathing?: Yes Independently performs ADLs?: Yes (appropriate for developmental age) Does the patient have difficulty walking or climbing stairs?: Yes Weakness of Legs: Left Weakness of Arms/Hands: Left  Permission Sought/Granted                  Emotional Assessment Appearance:: Appears stated age Attitude/Demeanor/Rapport: Engaged Affect (typically observed): Calm Orientation: : Oriented to Self, Oriented to Place, Oriented to  Time, Oriented to Situation Alcohol / Substance Use: Tobacco Use Psych Involvement: No (comment)  Admission diagnosis:  Brain tumor ([place]) [D49.6] Metastasis to brain ([place]) [C79.31] Pain [R52] Mass of brain [G93.89] Hemiparesis, unspecified hemiparesis etiology, unspecified laterality ([place]) [G81.90] Patient Active Problem List   Diagnosis Date Noted   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   Tobacco abuse 07/03/2021   Vasogenic edema ([place]) 07/03/2021   Neoplasm causing mass effect on adjacent structures 07/03/2021   BPH (benign prostatic hyperplasia) 07/03/2021   Leukocytosis 07/03/2021   Left-sided weakness 07/03/2021   Squamous cell carcinoma of lung, stage I, right ([place]) 06/21/2021   S/P Xi Robotic Assisted Video Thoracoscopy with  Right Lower Lobectomy 05/20/2021   Nodule of lower lobe of right lung 05/12/2021   Type 2 diabetes mellitus ([place]) 05/12/2021   Colitis 12/30/2020   Encounter for colonoscopy due to history of adenomatous colonic polyps 10/26/2020   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 [place] [address] Phone: [phone] Fax: [phone]     Social Determinants of Health (SDOH) Interventions    Readmission Risk Interventions No flowsheet data found.

## 2021-07-04 NOTE — Progress Notes (Signed)
Orthopedic Tech Progress Note Patient Details:  Stephen Diaz 10/06/1955 [id number]  Ortho Devices Type of Ortho Device: Arm sling Ortho Device/Splint Interventions: Application   Post Interventions Patient Tolerated: Well Instructions Provided: Care of device  [name] 07/04/2021, 11:52 AM

## 2021-07-04 NOTE — Consult Note (Signed)
Radiation Oncology         [phone]) [id number] ________________________________  Name: Stephen Diaz        MRN: [id number]  Date of Service: 07/04/21              DOB: 11-10-1955  CC:[name], PA-C     REFERRING PHYSICIAN:  Dr. [place]   DIAGNOSIS: The primary encounter diagnosis was Brain tumor [facility]). Diagnoses of Pain and Hemiparesis, unspecified hemiparesis etiology, unspecified laterality ([place]) were also pertinent to this visit.   HISTORY OF PRESENT ILLNESS: Stephen Diaz is a 66 y.o. male seen at the request of Dr. [place] with the hospitalist service with concerns for metastatic disease to the brain from lung cancer. 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 All 7 of the nodes including the following stations were negative; 4R, 7, 8, 9, 10, 11, 12. He started having weakness in his left upper extremity and was seen in the ED about a week and a half ago and it was felt his symptoms may be peripheral. He presented  yesterday with symptoms of numbness in the left arm and leg for 2 days and multiple falls.  He underwent CT of the head which showed an enhancing mass in the right superior frontal gyrus with vasogenic edema.  There is multifocal vasogenic edema also in the left frontal lobe.  An MRI of the brain with and without contrast yesterday revealed 3 lesions with a 2.8 cm left frontal mass with surrounding edema and mild midline shift to the right, a 2.3 cm lesion in the precentral gyrus on the right with central current necrosis and internal hemorrhage, and a 3 mm enhancing lesion in the left precentral gyrus without edema compatible with metastatic disease.  Repeat staging CT of the chest abdomen and pelvis showed postoperative changes in the right lower lobe region consistent with his prior lobectomy no evidence of adenopathy in the chest or metastatic findings were  otherwise seen.  He did have clustered tree-in-bud groundglass opacities in bilateral lung bases cholelithiasis, stigmata of atherosclerotic disease in the coronary and aortic vessels and emphysematous changes.  Given these findings we have been asked to see the patient to consider radiotherapy.  He was started on dexamethasone 4 mg every 6 hours by the inpatient team.    PREVIOUS RADIATION THERAPY: No   PAST MEDICAL HISTORY:  Past Medical History:  Diagnosis Date   Arthritis    right shoulder   Cancer ([place])    COPD (chronic obstructive pulmonary disease) ([place])    Diabetes mellitus without complication (HCC)    Type II   Hypertension    Right foot drop    from pinched nerve in back, wears brace       PAST SURGICAL HISTORY: Past Surgical History:  Procedure Laterality Date   CATARACT EXTRACTION Left    CATARACT EXTRACTION W/PHACO Right 08/13/2020   Procedure: CATARACT EXTRACTION PHACO AND INTRAOCULAR LENS PLACEMENT ([place]);  Surgeon: Baruch Goldmann, MD;  Location: [facility];  Service: Ophthalmology;  Laterality: Right;  CDE: 8.24   COLONOSCOPY  12/2020   Dr. Adelina Mings (general surgeon): diverticulsos and left sided colitis with erythema in the sigmoid and descending colon. bx: focal mild acute colitis ?infection vs ischemia   COLONOSCOPY  10/2009   Dr. Carlyon Prows [place]: diverticulosis, two tubular adenomas removed. descending colon erythema and mild ulcerations noted with benign  biopsies.   EYE SURGERY     HERNIA REPAIR     INTERCOSTAL NERVE BLOCK Right 05/20/2021   Procedure: INTERCOSTAL NERVE BLOCK;  Surgeon: Melrose Nakayama, MD;  Location: [place];  Service: Thoracic;  Laterality: Right;   LUNG REMOVAL, PARTIAL Right    LYMPH NODE DISSECTION Right 05/20/2021   Procedure: LYMPH NODE DISSECTION;  Surgeon: Melrose Nakayama, MD;  Location: [place];  Service: Thoracic;  Laterality: Right;   REVERSE SHOULDER ARTHROPLASTY Right 09/08/2020   Procedure: REVERSE TOTAL SHOULDER  ARTHROPLASTY;  Surgeon: Hiram Gash, MD;  Location: [place];  Service: Orthopedics;  Laterality: Right;   screws and steel plate in neck  (D5-3)     10-12 years ago  x2     FAMILY HISTORY:  Family History  Problem Relation Age of Onset   Crohn's disease Cousin    Colon cancer Neg Hx      SOCIAL HISTORY:  reports that he quit smoking about 7 weeks ago. His smoking use included cigarettes. He started smoking about 50 years ago. He has a 49.00 pack-year smoking history. He has never used smokeless tobacco. He reports that he does not currently use alcohol. He reports that he does not currently use drugs.  The patient is widowed and lives in [place].  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  He has an adult daughter, Eloisa Northern who lives in [place] and is a Designer, jewellery in an ER setting.  She helps him with his medical decision making.   ALLERGIES: Patient has no known allergies.   MEDICATIONS:  Current Facility-Administered Medications  Medication Dose Route Frequency Provider Last Rate Last Admin   acetaminophen (TYLENOL) tablet 650 mg  650 mg Oral Q6H PRN Norval Morton, MD       Or   acetaminophen (TYLENOL) suppository 650 mg  650 mg Rectal Q6H PRN Fuller Plan A, MD       albuterol (PROVENTIL) (2.5 MG/3ML) 0.083% nebulizer solution 2.5 mg  2.5 mg Nebulization Q6H PRN Tamala Julian, Rondell A, MD   2.5 mg at 07/04/21 1027   dexamethasone (DECADRON) tablet 4 mg  4 mg Oral Q6H Smith, Rondell A, MD   4 mg at 07/04/21 1256   enoxaparin (LOVENOX) injection 40 mg  40 mg Subcutaneous Q24H Smith, Rondell A, MD   40 mg at 07/03/21 1655   feeding supplement (GLUCERNA SHAKE) (GLUCERNA SHAKE) liquid 237 mL  237 mL Oral BID BM Smith, Rondell A, MD   237 mL at 07/04/21 1038   umeclidinium bromide (INCRUSE ELLIPTA) 62.5 MCG/ACT 1 puff  1 puff Inhalation Daily Smith, Rondell A, MD       And   fluticasone furoate-vilanterol (BREO ELLIPTA)  100-25 MCG/ACT 1 puff  1 puff Inhalation Daily Smith, Rondell A, MD       insulin aspart (novoLOG) injection 0-9 Units  0-9 Units Subcutaneous TID WC Smith, Rondell A, MD   2 Units at 07/04/21 1257   lisinopril (ZESTRIL) tablet 10 mg  10 mg Oral Daily Smith, Rondell A, MD   10 mg at 07/04/21 0853   metoprolol tartrate (LOPRESSOR) injection 5 mg  5 mg Intravenous Q6H PRN Fuller Plan A, MD       ondansetron (ZOFRAN) tablet 4 mg  4 mg Oral Q6H PRN Fuller Plan A, MD       Or   ondansetron (ZOFRAN) injection 4 mg  4 mg Intravenous Q6H PRN Tamala Julian, Rondell  A, MD       oxyCODONE (Oxy IR/ROXICODONE) immediate release tablet 5 mg  5 mg Oral Q4H PRN Smith, Rondell A, MD       polyethylene glycol (MIRALAX / GLYCOLAX) packet 17 g  17 g Oral Daily PRN Fuller Plan A, MD   17 g at 07/04/21 0554   sodium chloride flush (NS) 0.9 % injection 3 mL  3 mL Intravenous Q12H Smith, Rondell A, MD   3 mL at 07/04/21 0856   tamsulosin (FLOMAX) capsule 0.4 mg  0.4 mg Oral QPC breakfast Fuller Plan A, MD   0.4 mg at 07/04/21 3536     REVIEW OF SYSTEMS: On review of systems, the patient reports that he is doing okay.  His main concern however is just the persistence of loss of motor function in his left upper extremity.  He is only able to passively move his left arm with the help with his right hand.  He reports sensitivity of the skin being very minimal he cannot tell from light touch in that left upper extremity.  He does have strength in his lower extremities however and feels like the left foot and leg are a little bit stronger than when he presented.  He denies any visual changes, seizure activity.  He does admit to fatigue but reports that he thought this was likely from his recent surgery.     PHYSICAL EXAM:  Wt Readings from Last 3 Encounters:  07/03/21 184 lb 11.2 oz (83.8 kg)  06/24/21 190 lb 9.6 oz (86.5 kg)  06/21/21 190 lb 9.6 oz (86.5 kg)   Temp Readings from Last 3 Encounters:  07/04/21 (!) 97.5  F (36.4 C) (Oral)  06/24/21 98.2 F (36.8 C) (Oral)  06/21/21 (!) 97.3 F (36.3 C) (Tympanic)   BP Readings from Last 3 Encounters:  07/04/21 101/73  06/24/21 115/82  06/21/21 (!) 143/91   Pulse Readings from Last 3 Encounters:  07/04/21 (!) 106  06/24/21 100  06/21/21 83   Pain Assessment Pain Score: 0-No pain/10  In general this is a well appearing Caucasian male in no acute distress.  He is alert and oriented x4 and appropriate throughout the examination.  Cardiopulmonary assessment is negative for acute distress and he has normal appearing effort.  The left upper extremity has to be passively moved. Light touch is not preserved.  Lower extremities have equal sensitivity to light touch, left lower extremity has 4 out of 5 strength whereas the right has 5 out of 5 strength.  Grip strength is 5 out of 5 for the right upper extremity but 1-2 out of 5 for the left.    ECOG = 4  0 - Asymptomatic (Fully active, able to carry on all predisease activities without restriction)  1 - Symptomatic but completely ambulatory (Restricted in physically strenuous activity but ambulatory and able to carry out work of a light or sedentary nature. For example, light housework, office work)  2 - Symptomatic, <50% in bed during the day (Ambulatory and capable of all self care but unable to carry out any work activities. Up and about more than 50% of waking hours)  3 - Symptomatic, >50% in bed, but not bedbound (Capable of only limited self-care, confined to bed or chair 50% or more of waking hours)  4 - Bedbound (Completely disabled. Cannot carry on any self-care. Totally confined to bed or chair)  5 - Death   Eustace Pen MM, Creech RH, Tormey DC, et al. [phone]). "Toxicity  and response criteria of the [facility]". [place] Oncol. 5 (6): 649-55    LABORATORY DATA:  Lab Results  Component Value Date   WBC 10.2 07/04/2021   HGB 11.5 (L) 07/04/2021   HCT 35.1 (L)  07/04/2021   MCV 82.8 07/04/2021   PLT 272 07/04/2021   Lab Results  Component Value Date   NA 133 (L) 07/04/2021   K 3.9 07/04/2021   CL 102 07/04/2021   CO2 24 07/04/2021   Lab Results  Component Value Date   ALT 20 07/02/2021   AST 20 07/02/2021   ALKPHOS 82 07/02/2021   BILITOT 0.5 07/02/2021      RADIOGRAPHY: DG Chest 2 View  Result Date: 06/14/2021 CLINICAL DATA:  Shortness of breath, history of lymph node dissection 12/22 EXAM: CHEST - 2 VIEW COMPARISON:  Chest radiograph 06/01/2021 FINDINGS: The cardiomediastinal silhouette is within normal limits. There remains a small to medium size right pleural effusion, decreased in size since 06/01/2021. Aeration in the right lung has significantly improved compared to that study. The left lung is clear. There is no significant left effusion. There is no pneumothorax. The bones are stable. Right shoulder arthroplasty hardware is again noted. IMPRESSION: Persistent small to medium size right pleural effusion, decreased in size since 06/01/2021, with significantly improved aeration in the right lung. Electronically Signed   By: Valetta Mole M.D.   On: 06/14/2021 10:25   CT HEAD W & WO CONTRAST (5MM)  Result Date: 07/03/2021 CLINICAL DATA:  66 year old male with multiple falls. Neurologic deficit. Left extremity numbness for 2 days. Cavitary right lung lesion suspicious for lung cancer in October. EXAM: CT HEAD WITHOUT AND WITH CONTRAST TECHNIQUE: Contiguous axial images were obtained from the base of the skull through the vertex without and with intravenous contrast. RADIATION DOSE REDUCTION: This exam was performed according to the departmental dose-optimization program which includes automated exposure control, adjustment of the mA and/or kV according to patient size and/or use of iterative reconstruction technique. CONTRAST:  63mL OMNIPAQUE IOHEXOL 300 MG/ML  SOLN COMPARISON:  PET-CT 04/04/2021. FINDINGS: Brain: Multifocal cerebral vasogenic  edema. Anterior left frontal lobe 3 cm rim enhancing mass (series 7, image 21) with regional vasogenic edema. Posterior right superior frontal gyrus 2.6 cm rim enhancing mass with regional vasogenic edema. This lesion is perirolandic, with enhancement and edema subsequently affecting the left extremity motor and sensory areas. Mass effect on the lateral ventricles, but no midline shift. Basilar cisterns remain patent. No ventriculomegaly. No other No abnormal enhancement identified. No acute intracranial hemorrhage identified. No cortically based acute infarct identified. Vascular: Mild Calcified atherosclerosis at the skull base. Major intracranial vascular structures are enhancing as expected and appear to be patent. Skull: No acute or suspicious osseous lesion identified. Sinuses/Orbits: Visualized paranasal sinuses and mastoids are clear. Other: Postoperative changes to both globes. No acute orbit or scalp soft tissue finding. IMPRESSION: 1. Positive for Metastatic Disease to the brain. Two rim enhancing masses, up to 3 cm diameter, with vasogenic edema. Superior Right Perirolandic Cortex and anterior left frontal lobe affected. Mass effect on the lateral ventricles but no midline shift and basilar cisterns remain normal. 2. Brain MRI without and with contrast might detect additional lesions. Electronically Signed   By: Genevie Ann M.D.   On: 07/03/2021 05:59   MR BRAIN W WO CONTRAST  Result Date: 07/03/2021 CLINICAL DATA:  Rule out metastatic disease. Squamous cell carcinoma lung. Abnormal head CT. EXAM: MRI HEAD WITHOUT AND WITH  CONTRAST TECHNIQUE: Multiplanar, multiecho pulse sequences of the brain and surrounding structures were obtained without and with intravenous contrast. CONTRAST:  8.42mL GADAVIST GADOBUTROL 1 MMOL/ML IV SOLN COMPARISON:  CT head 07/03/2021 FINDINGS: Brain: 3 enhancing lesions in the brain are present compatible with metastatic disease. 2.8 cm left frontal mass with peripheral  enhancement and central necrosis. Minimal associated hemorrhage. Moderate surrounding edema with mild midline shift to the right. 2.3 cm lesion in the precentral gyrus on the right. There is central necrosis with internal hemorrhage. Moderate white matter edema and local mass effect. 3 mm enhancing lesion left precentral gyrus without significant edema compatible with metastatic disease. Ventricle size normal.  Negative for acute infarct. Vascular: Normal arterial flow voids Skull and upper cervical spine: Negative Sinuses/Orbits: Paranasal sinuses clear. Bilateral cataract extraction Other: None IMPRESSION: Findings compatible with metastatic disease to brain. Three lesions are identified. Left frontal lesion and right precentral gyrus lesion show significant edema and local mass effect. 3 mm lesion left precentral gyrus. Electronically Signed   By: Franchot Gallo M.D.   On: 07/03/2021 11:36   CT CHEST ABDOMEN PELVIS W CONTRAST  Result Date: 07/03/2021 CLINICAL DATA:  Brain metastatic disease, status post right lower lobectomy EXAM: CT CHEST, ABDOMEN, AND PELVIS WITH CONTRAST TECHNIQUE: Multidetector CT imaging of the chest, abdomen and pelvis was performed following the standard protocol during bolus administration of intravenous contrast. RADIATION DOSE REDUCTION: This exam was performed according to the departmental dose-optimization program which includes automated exposure control, adjustment of the mA and/or kV according to patient size and/or use of iterative reconstruction technique. CONTRAST:  114mL OMNIPAQUE IOHEXOL 300 MG/ML SOLN, additional oral enteric contrast COMPARISON:  None. FINDINGS: CT CHEST FINDINGS Cardiovascular: Aortic atherosclerosis. Normal heart size. Three-vessel coronary artery calcifications and stents. No pericardial effusion. Mediastinum/Nodes: No enlarged mediastinal, hilar, or axillary lymph nodes. Thyroid gland, trachea, and esophagus demonstrate no significant findings.  Lungs/Pleura: Status post right lower lobectomy. Trace, loculated right pleural effusion, with a small, loculated air component superiorly and dependently (series 5, image 86). Moderate centrilobular emphysema. Clustered centrilobular and tree-in-bud ground-glass opacities in the bilateral lung bases (series 5, image 126). No pleural effusion or pneumothorax. Musculoskeletal: No chest wall mass or suspicious osseous lesions identified. CT ABDOMEN PELVIS FINDINGS Hepatobiliary: No solid liver abnormality is seen. Faintly rim calcified gallstone in the gallbladder. No gallbladder wall thickening, or biliary dilatation. Pancreas: Unremarkable. No pancreatic ductal dilatation or surrounding inflammatory changes. Spleen: Normal in size without significant abnormality. Adrenals/Urinary Tract: Adrenal glands are unremarkable. Kidneys are normal, without renal calculi, solid lesion, or hydronephrosis. Bladder is unremarkable. Stomach/Bowel: Stomach is within normal limits. Appendix appears normal. No evidence of bowel wall thickening, distention, or inflammatory changes. Descending and sigmoid diverticulosis. Vascular/Lymphatic: Aortic atherosclerosis. No enlarged abdominal or pelvic lymph nodes. Reproductive: No mass or other abnormality. Other: No abdominal wall hernia or abnormality. No ascites. Musculoskeletal: No acute osseous findings. IMPRESSION: 1. Status post right lower lobectomy. 2. No evidence of lymphadenopathy or metastatic disease in the chest, abdomen, or pelvis. Please note, that in order to ensure subspecialty interpretation, restaging examinations for known malignancies should generally be deferred to the nonemergent, outpatient setting. 3. Clustered centrilobular and tree-in-bud ground-glass opacities in the bilateral lung bases. Findings suggest atypical infection or aspiration. 4. Cholelithiasis. 5. Coronary artery disease. Aortic Atherosclerosis (ICD10-I70.0) and Emphysema (ICD10-J43.9).  Electronically Signed   By: Delanna Ahmadi M.D.   On: 07/03/2021 13:29   DG Chest Port 1 View  Result Date: 07/03/2021 CLINICAL DATA:  Left arm and leg numbness.  Multiple falls. EXAM: PORTABLE CHEST 1 VIEW COMPARISON:  06/14/2021. FINDINGS: The heart size and mediastinal contours are within normal limits. Atherosclerotic calcification of the aorta is noted. There is a persistent small to moderate right pleural effusion with mild atelectasis at the right lung base. The left lung is clear. No pneumothorax. Shoulder arthroplasty changes are noted on the right. IMPRESSION: Persistent small to moderate right pleural effusion, not significantly changed from the prior exam. Electronically Signed   By: Brett Fairy M.D.   On: 07/03/2021 04:56       IMPRESSION/PLAN: 1. 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 Dr. Lisbeth Renshaw has reviewed the patient's imaging.  I spent time with him today reviewing the rationale for his imaging and clinically he seems to be doing better since the administration of steroids.  We discussed the findings are concerning for metastatic disease, and would recommend stereotactic radiosurgery.  In order to proceed with focal therapy, we would need to proceed with a 3 Tesla MRI scan for more detailed imaging where cuts are taken at 1 mm intervals in order to plan her treatment at the same interval.  We discussed the likelihood of either 1-3 fractions of stereotactic radiosurgery [facility]), and we discussed the role of Dr. Christella Noa and his treatment team to coordinate the planning of radiotherapy as well.  We discussed the risks, benefits, short, and long term effects of radiotherapy, as well as the curative intent, and the patient is interested in proceeding.  We will follow-up with him in the outpatient setting with the results of an outpatient 3 Tesla MRI scan.  He will be receiving contact information and coordination of his care  through our brain oncology navigator.  I will also reach out to his daughter who lives out of state so that she is not part of the discussion.   In a visit lasting 75 minutes, greater than 50% of the time was spent face to face discussing the patient's condition, in preparation for the discussion, and coordinating the patient's care.     Carola Rhine, [facility]   **Disclaimer: This note was dictated with voice recognition software. Similar sounding words can inadvertently be transcribed and this note may contain transcription errors which may not have been corrected upon publication of note.**

## 2021-07-04 NOTE — Evaluation (Addendum)
Occupational Therapy Evaluation Patient Details Name: Stephen Diaz MRN: [id number] DOB: 04/01/1956 Today's Date: 07/04/2021   History of Present Illness Pt is a 66 year old male presented to ED with progressively worsening L sided weakness. CT scan of brain significant for 2g 3 masses up to 3 cm in diameter with vasogenic edema present mass effect on the lateral ventricles, but no midline shift appreciated. 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 Pt is alert and oriented x4, demonstrates good awareness into deficits. PTA pt reports MOD I- I in all ADL/IADL. Pt with a history of falls since lobectomy. At this time pt presents with deficits in LUE function and balance affecting safe and effective ADL completion. Cognition and vision appear WFL, will continue to assess. Pt educated on role of OT, role of rehab, LUE AAROM HEP, LUE positioning to preserve joint integreity and to decrease risk of sublux, ADL completion using hemi technique; will continue to benefit from education. Ortho tech called to deliver sling for LUE to promote optimal positioning. Recommend wearing with mobility so LUE does not hang down while ambulating. Pt would benefit from skilled OT to address functional deficits and to facilitate optimal ADL completion with LUE and LLE deficits. Pt is left as received, NAD, all needs met. Respiratory present for a treatment. Recommend discharge home with HHOT to address functional deficits, will continue to follow acutely.    Recommendations for follow up therapy are one component of a multi-disciplinary discharge planning process, led by the attending physician.  Recommendations may be updated based on patient status, additional functional criteria and insurance authorization.    Follow Up Recommendations  Home health OT    Assistance Recommended at Discharge Intermittent Supervision/Assistance  Patient can return home with the following A little help with walking and/or transfers;Assistance with cooking/housework;A little help with bathing/dressing/bathroom    Functional Status Assessment  Patient has had a recent decline in their functional status and demonstrates the ability to make significant improvements in function in a reasonable and predictable amount of time.  Equipment Recommendations  BSC/3in1;Tub/shower bench (sling for LUE)    Recommendations for Other Services       Precautions / Restrictions Precautions Precautions: Fall Precaution Comments: 5 in last month, 2 in yard and 3 in house ( Restrictions Weight Bearing Restrictions: No      Mobility Bed Mobility Overal bed mobility: Modified Independent             General bed mobility comments: HOB raised    Transfers Overall transfer level: Needs assistance   Transfers: Sit to/from Stand, Bed to chair/wheelchair/BSC Sit to Stand: Min guard                  Balance Overall balance assessment: Needs assistance Sitting-balance support: Feet unsupported Sitting balance-Leahy Scale: Good     Standing balance support: During functional activity Standing balance-Leahy Scale: Fair                             ADL either performed or assessed with clinical judgement   ADL Overall ADL's : Needs assistance/impaired Eating/Feeding: Set up   Grooming: Wash/dry hands;Wash/dry face;Oral care;Applying deodorant;Standing;Min guard;Supervision/safety Grooming Details (indicate cue type and reason): grooming tasks standing at the sink  with CGA- close sup         Upper Body Dressing : Minimal assistance       Toilet Transfer: Min guard;Ambulation Toilet Transfer Details (indicate cue type and reason): simulated, HHA         Functional mobility during ADLs:  Min guard General ADL Comments: MAX A for package/container managment of denture tabs; Pt able to open denture container, toothpaste with compensatory techniques; educated on Sabin through flaccid LUE on counter top while completing standing grooming tasks     Vision   Vision Assessment?: No apparent visual deficits     Perception     Praxis      Pertinent Vitals/Pain Pain Assessment Pain Assessment: No/denies pain     Hand Dominance Right   Extremity/Trunk Assessment Upper Extremity Assessment Upper Extremity Assessment: LUE deficits/detail;RUE deficits/detail RUE Deficits / Details: pt has a history of R shoulder surgery (reports replacement) LUE Deficits / Details: flaccid LUE- trace shoulder shrug and triceps, no biceps, wrist flexion/extension muscle activation noted. No sublux noted at this time. PROM is [facility]. LUE Sensation: WNL LUE Coordination: decreased fine motor;decreased gross motor   Lower Extremity Assessment Lower Extremity Assessment: LLE deficits/detail   Cervical / Trunk Assessment Cervical / Trunk Assessment: Normal   Communication Communication Communication: No difficulties   Cognition Arousal/Alertness: Awake/alert Behavior During Therapy: WFL for tasks assessed/performed Overall Cognitive Status: Within Functional Limits for tasks assessed                                 General Comments: alert and orietned x4, good insight into deficits     General Comments  vss throughout;    Exercises General Exercises - Upper Extremity Shoulder Flexion: AAROM, Left, 15 reps Shoulder Extension: AAROM, 5 reps, Left Shoulder ABduction: AAROM, Left Elbow Flexion: AAROM, Left, 5 reps Elbow Extension: AAROM, Left, 5 reps Wrist Flexion: AAROM, 5 reps, Left Wrist Extension: Left, AAROM, 5 reps Other Exercises Other Exercises: pt educated on role of OT, role of rehab, LUE AAROM HEP, LUE positioning to preserve joint integreity and to decrease risk  of sublux, ADL completion using hemi technique; will continue to benefit from education   Shoulder Instructions      Home Living Family/patient expects to be discharged to:: Private residence Living Arrangements: Alone Available Help at Discharge: Friend(s) (neighbor, other friends) Type of Home: House Home Access: Stairs to enter Technical brewer of Steps: 1 Entrance Stairs-Rails: None Home Layout: One level     Bathroom Shower/Tub: Teacher, early years/pre: Standard Bathroom Accessibility: Yes   Home Equipment: None          Prior Functioning/Environment Prior Level of Function : Independent/Modified Independent               ADLs Comments: MOD I- I n all ADL, pt reports falls        OT Problem List: Decreased strength;Impaired balance (sitting and/or standing);Decreased range of motion;Decreased activity tolerance;Decreased coordination;Decreased knowledge of use of DME or AE;Impaired UE functional use      OT Treatment/Interventions: Self-care/ADL training;DME and/or AE instruction;Therapeutic activities;Balance training;Therapeutic exercise;Neuromuscular education;Splinting;Patient/family education    OT Goals(Current goals can be found in the care plan section) Acute Rehab OT Goals Patient Stated Goal: to work on his L arm OT Goal Formulation: With patient Time For Goal Achievement: 07/18/21 Potential to Achieve Goals: Good ADL Goals Pt Will Perform Grooming: with modified independence Pt  Will Perform Upper Body Dressing: with modified independence Pt Will Perform Lower Body Dressing: with modified independence Pt Will Transfer to Toilet: with modified independence Pt Will Perform Toileting - Clothing Manipulation and hygiene: with modified independence Pt/caregiver will Perform Home Exercise Program: Left upper extremity;Increased strength;Increased ROM  OT Frequency: Min 3X/week    Co-evaluation              AM-PAC OT "6 Clicks"  Daily Activity     Outcome Measure Help from another person eating meals?: None Help from another person taking care of personal grooming?: A Little Help from another person toileting, which includes using toliet, bedpan, or urinal?: A Little Help from another person bathing (including washing, rinsing, drying)?: A Little Help from another person to put on and taking off regular upper body clothing?: A Little Help from another person to put on and taking off regular lower body clothing?: A Lot 6 Click Score: 18   End of Session Equipment Utilized During Treatment: Gait belt  Activity Tolerance: Patient tolerated treatment well Patient left: in bed;with call bell/phone within reach;with bed alarm set (respiratory present)  OT Visit Diagnosis: History of falling (Z91.81);Unsteadiness on feet (R26.81);Hemiplegia and hemiparesis Hemiplegia - Right/Left: Left Hemiplegia - dominant/non-dominant: Non-Dominant                Time: 0272-5366 OT Time Calculation (min): 14 min Charges:  OT General Charges $OT Visit: 1 Visit OT Evaluation $OT Eval Moderate Complexity: 1 Mod  Shanon Payor, OTD OTR/L  07/04/21, 11:46 AM

## 2021-07-05 ENCOUNTER — Encounter: Payer: Self-pay | Admitting: *Deleted

## 2021-07-05 ENCOUNTER — Ambulatory Visit: Payer: Medicare Other | Admitting: Thoracic Surgery (Cardiothoracic Vascular Surgery)

## 2021-07-05 ENCOUNTER — Other Ambulatory Visit: Payer: Self-pay | Admitting: Radiation Oncology

## 2021-07-05 LAB — GLUCOSE, CAPILLARY
Glucose-Capillary: 154 mg/dL — ABNORMAL HIGH (ref 70–99)
Glucose-Capillary: 195 mg/dL — ABNORMAL HIGH (ref 70–99)

## 2021-07-05 MED ORDER — TAMSULOSIN HCL 0.4 MG PO CAPS: 0.4000 mg | ORAL_CAPSULE | Freq: Every day | ORAL | 2 refills | Status: AC

## 2021-07-05 MED ORDER — INSULIN LISPRO 200 UNIT/ML ~~LOC~~ SOPN: 2.0000 [IU] | PEN_INJECTOR | Freq: Three times a day (TID) | SUBCUTANEOUS | 2 refills | Status: AC

## 2021-07-05 MED ORDER — "PEN NEEDLES 3/16"" 31G X 5 MM MISC": 2 refills | Status: AC

## 2021-07-05 MED ORDER — LORAZEPAM 0.5 MG PO TABS: ORAL_TABLET | ORAL | 0 refills | Status: DC

## 2021-07-05 MED ORDER — DEXAMETHASONE 4 MG PO TABS: 4.0000 mg | ORAL_TABLET | Freq: Three times a day (TID) | ORAL | 2 refills | Status: DC

## 2021-07-05 NOTE — Progress Notes (Signed)
Oncology Nurse Navigator Documentation  Oncology Nurse Navigator Flowsheets 07/05/2021 07/04/2021 06/21/2021 06/14/2021  Abnormal Finding Date - - 04/22/2021 -  Confirmed Diagnosis Date - - 05/20/2021 -  Diagnosis Status - - Confirmed Diagnosis Complete -  Planned Course of Treatment - - Surgery -  Phase of Treatment - - Surgery -  Surgery Actual Start Date: - - 05/20/2021 -  Navigator Follow Up Date: 07/19/2021 07/05/2021 - 06/21/2021  Navigator Follow Up Reason: Follow-up Appointment Pathology - New Patient Appointment  Navigator Location [place]  Referral Date to RadOnc/MedOnc - - - 06/14/2021  Navigator Encounter Type Inpatient Inpatient Clinic/MDC Other:  Treatment Initiated Date - - 05/20/2021 -  Patient Visit Type - - Initial;MedOnc Other  Treatment Phase - - Follow-up Other  Barriers/Navigation Needs Coordination of Care/per Dr. Julien Nordmann, I notified pathology to send bx on 12/9 for PDL 1 testing. I also completed a scheduling message to get Mr. Luan Pulling a follow up appt with medical oncology.  Coordination of Care Education Coordination of Care  Education - - Newly Diagnosed Cancer Education;Other -  Interventions Coordination of Care Coordination of Care Education;Psycho-Social Support Coordination of Care  Acuity Level 2-Minimal Needs (1-2 Barriers Identified) Level 2-Minimal Needs (1-2 Barriers Identified) Level 3-Moderate Needs (3-4 Barriers Identified) Level 2-Minimal Needs (1-2 Barriers Identified)  Coordination of Care Pathology Other - Other  Education Method - - Verbal;Written -  Time Spent with Patient [phone]

## 2021-07-05 NOTE — TOC Progression Note (Signed)
Transition of Care [facility]) - Progression Note    Patient Details  Name: Stephen Diaz MRN: [id number] Date of Birth: 02-27-1956  Transition of [facility]) CM/SW Contact  Leeroy Cha, RN Phone Number: 07/05/2021, 8:43 AM  Clinical Narrative:    3in1 and tub bench ordered through [employer] at 0843   Expected Discharge Plan: [place] Barriers to Discharge: Continued Medical Work up  Expected Discharge Plan and Services Expected Discharge Plan: [place]   Discharge Planning Services: CM Consult Post Acute Care Choice: Durable Medical Equipment Living arrangements for the past 2 months: Single Family Home                 DME Arranged: 3-N-1, Tub bench DME Agency: [employer] Date DME Agency Contacted: 07/05/21 Time DME Agency Contacted: [phone] Representative spoke with at DME Agency: [email or web address]             Social Determinants of Health ([place]) Interventions    Readmission Risk Interventions No flowsheet data found.

## 2021-07-05 NOTE — Plan of Care (Signed)

## 2021-07-05 NOTE — Progress Notes (Signed)
I spoke with the patient about his claustrophobia for MRI and we discussed using Ativan prior to MRIs, simulaton and radiosurgery treatments. We discussed the side effect profile and that he would need a driver on days he uses this for appointments related to [facility]. A new Rx was sent to his outpatient pharmacy.

## 2021-07-05 NOTE — Progress Notes (Signed)
D/C to home instructions reviewed with pt. Pt acknowledged understanding and demonstrated appropriate administration of insulin. SRP< RN

## 2021-07-05 NOTE — Progress Notes (Signed)
Instructed pt on insulin administration. Instructed pt on cleaning and prepping the site, how to rotate the different subcutaneous sites and in administration based on CBG result and the ordered parameters the doctor has provided for pt. Taught pt about high and low CBGs and preventions of hypoglycemic reaction, and eating a bedtime snack  if blood sugar results low at bedtime.Pt able to demonstrate and verbalize understanding.

## 2021-07-05 NOTE — Discharge Summary (Signed)
Physician Discharge Summary  Stephen Diaz UUV:[id number] DOB: 09-20-1955 DOA: 07/02/2021  PCP: [name], PA-C  Admit date: 07/02/2021 Discharge date: 07/05/2021  Admitted From: Home Disposition: Home  Recommendations for Outpatient Follow-up:  Follow up with PCP in 1-2 weeks Follow-up with medical oncology, radiation oncology, neurosurgery as scheduled Discharging on Decadron 4 mg p.o. 3 times daily for vasogenic edema and brain metastases Discharging on Humalog insulin for coverage while on Decadron Consider outpatient palliative care to follow given his newly metastatic disease  Home Health: Outpatient PT/OT Equipment/Devices: 3 and 1 bedside commode, tub bench, three-point cane  Discharge Condition: Stable CODE STATUS: Full code Diet recommendation: Heart healthy/consistent carbohydrate diet  History of present illness:  Stephen Diaz is a 66 year old male with past medical history significant for essential hypertension, COPD, type 2 diabetes mellitus, tobacco use disorder and recent diagnosis of 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  Patient also reports some weakness of his left leg as well with recurrent falls.  Patient currently lives alone has been having difficulty getting around and performing his ADLs.  Patient has an aunt who lives locally but is unable to drive and his daughter lives out of state.  Patient denies fever, no headache, no chest pain, no shortness of breath, no cough, no nausea cefonicid diarrhea, no abdominal pain.    Patient was recently seen at [facility], ED on 06/24/2021, case was discussed with neurology who felt patient could have outpatient follow-up; and imaging of the brain was not performed at that time.  Patient reports he quit smoking 05/19/2021, previously smoked for over 49 years anywhere  from 1 to 1.5 packs/day on average.   In the ED, temperature 98.0 F, HR 101, RR 16, BP 114/83, SPO2 97% on room air.  Sodium 136, potassium 4.3, chloride 103, CO2 23, glucose 133, BUN 12, creatinine 0.70.  AST 20, ALT 20, total bilirubin 0.5.  WBC 13.5, hemoglobin 13.2, platelets 353.  Urinalysis unrevealing.  EtOH level less than 10.  UDS positive for THC.  CT chest/abdomen/pelvis with status post right lower lobectomy, no evidence of lymphadenopathy or metastatic disease process in the chest/abdomen/pelvis, clustered central lobar and tree-in-bud groundglass opacities bilateral lung bases.  MR brain with and without contrast with findings compatible with metastatic disease with 3 lesions, 2.8 cm left frontal lesion, 2.3 cm right precentral gyrus lesion with significant edema and local mass-effect, 3 mm lesion left precentral gyrus.  Neurology, neurosurgery were consulted with recommendations of initiation of steroids and transfer to [facility] for radiation oncology evaluation.  Hospitalist service consulted for further evaluation management.    Hospital course:  Right lung squamous cell carcinoma s/p right lower lobe robotic lobectomy Brain metastases Patient presenting to ED with progressive left upper/lower extremity weakness over the past several weeks.  Weakness has progressed in his left upper extremity with inability to utilize for ADLs.  MRI brain with and without contrast notable for 3 metastatic lesions within the left frontal area, right and left precentral gyrus.  Was evaluated by neurosurgery, Dr. Christella Noa and neurology, Dr. Leonel Ramsay with recommendations of initiation of steroids and transfer to [facility] for radiation oncology evaluation.  Patient was seen by radiation oncology and recommended outpatient follow-up with 3T brain MRI and will likely need stereotactic radiosurgery.  Continue Decadron 4 mg p.o. 3 times daily.  Outpatient follow-up with medical/radiation  oncology, neurosurgery.  Left upper/lower extremity weakness Etiology likely secondary to brain metastases. Continue steroids as above.  Seen by PT/OT recommending arm sling, 3 and 1 bedside commode, tub bench.  Patient prefers outpatient PT/OT.   Leukocytosis: Resolved CBC elevated at 13.5.  No infectious etiology elucidated.  Likely secondary to reactive versus dehydration.  Now resolved.   Essential hypertension Continue home lisinopril 10 mg p.o. daily   Type 2 diabetes mellitus Hemoglobin A1c 6.2 on 05/18/2021.  Home regimen includes metformin 1000 mg p.o. daily.  Patient also given Humalog insulin pen for further coverage given his need of Decadron as above.   COPD, without exacerbation: Home regimen includes Trelegy Ellipta.  Not oxygen dependent at baseline.  On room air. Continue Trelegy Ellipta   Tobacco use disorder: Patient with approximate 49 smoking pack-year history, quit smoking 05/19/2021.  Congratulated on tobacco cessation. Continue to encourage avoidance of tobacco use.   BPH: Tamsulosin 0.4 mg p.o. daily  Discharge Diagnoses:  Principal Problem:   Metastasis to brain [facility]) Active Problems:   Chronic obstructive pulmonary disease (HCC)   Type 2 diabetes mellitus (HCC)   Squamous cell carcinoma of lung, stage I, right (HCC)   Essential hypertension   Tobacco abuse   Vasogenic edema (HCC)   Neoplasm causing mass effect on adjacent structures   BPH (benign prostatic hyperplasia)   Leukocytosis   Left-sided weakness   Protein-calorie malnutrition, severe    Discharge Instructions  Discharge Instructions     Call MD for:  difficulty breathing, headache or visual disturbances   Complete by: As directed    Call MD for:  extreme fatigue   Complete by: As directed    Call MD for:  persistant dizziness or light-headedness   Complete by: As directed    Call MD for:  persistant nausea and vomiting   Complete by: As directed    Call MD for:  severe  uncontrolled pain   Complete by: As directed    Call MD for:  temperature >100.4   Complete by: As directed    Diet - low sodium heart healthy   Complete by: As directed    Increase activity slowly   Complete by: As directed       Allergies as of 07/05/2021   No Known Allergies      Medication List     TAKE these medications    albuterol 108 (90 Base) MCG/ACT inhaler Commonly known as: VENTOLIN HFA Inhale 2 puffs into the lungs every 6 (six) hours as needed for wheezing or shortness of breath.   aspirin 81 MG EC tablet Take 81 mg by mouth daily.   Centrum Silver 50+Men Tabs Take 1 tablet by mouth daily.   dexamethasone 4 MG tablet Commonly known as: DECADRON Take 1 tablet (4 mg total) by mouth 3 (three) times daily.   ibuprofen 200 MG tablet Commonly known as: ADVIL Take 2 tablets (400 mg total) by mouth every 6 (six) hours as needed for mild pain.   insulin lispro 200 UNIT/ML KwikPen Commonly known as: HUMALOG Inject 2-10 Units into the skin 3 (three) times daily before meals. Take 2 units for glucose 150-200, 4 units for glucose 201-250, 6 units for glucose 251-300, 8 units for glucose 201-350 and 10 units for glucose 351-400.   lisinopril 10 MG tablet Commonly known as: ZESTRIL Take 10 mg by mouth daily.   LORazepam 0.5 MG tablet Commonly known as: Ativan 1 tab po 30  minutes prior to radiation or MRI scans   metFORMIN 500 MG tablet Commonly known as: GLUCOPHAGE Take 1,000 mg by mouth daily with breakfast.   oxyCODONE 5 MG immediate release tablet Commonly known as: Oxy IR/ROXICODONE Take 1 tablet (5 mg total) by mouth every 4 (four) hours as needed for moderate pain.   Pen Needles 3/16" 31G X 5 MM Misc Use as directed with insulin pen   tamsulosin 0.4 MG Caps capsule Commonly known as: FLOMAX Take 1 capsule (0.4 mg total) by mouth daily after breakfast. Start taking on: July 06, 2021   Trelegy Ellipta 100-62.5-25 MCG/ACT Aepb Generic drug:  Fluticasone-Umeclidin-Vilant Inhale 1 puff into the lungs daily.               Durable Medical Equipment  (From admission, onward)           Start     Ordered   07/05/21 1106  For home use only DME Cane  Once       Comments: 3 or 4 prong cane   07/05/21 1105   07/04/21 1209  For home use only DME 3 n 1  Once        07/04/21 1208   07/04/21 1209  For home use only DME Tub bench  Once        07/04/21 1208            Follow-up Information     [name], PA-C. Schedule an appointment as soon as possible for a visit in 1 week(s).   Specialty: Physician Assistant Contact information: [place] [address] [phone]         Kyung Rudd, MD Follow up.   Specialty: Radiation Oncology Contact information: [address] [place] [address] [phone]         Ashok Pall, MD Follow up.   Specialty: Neurosurgery Contact information: [address] [place] [address] [phone]         Curt Bears, MD Follow up.   Specialty: Oncology Contact information: [place] [address] [phone]                No Known Allergies  Consultations: Neurology, Dr. Leonel Ramsay Neurosurgery, Dr. Christella Noa Radiation oncology, Dr. Lisbeth Renshaw   Procedures/Studies: DG Chest 2 View  Result Date: 06/14/2021 CLINICAL DATA:  Shortness of breath, history of lymph node dissection 12/22 EXAM: CHEST - 2 VIEW COMPARISON:  Chest radiograph 06/01/2021 FINDINGS: The cardiomediastinal silhouette is within normal limits. There remains a small to medium size right pleural effusion, decreased in size since 06/01/2021. Aeration in the right lung has significantly improved compared to that study. The left lung is clear. There is no significant left effusion. There is no pneumothorax. The bones are stable. Right shoulder arthroplasty hardware is again noted. IMPRESSION: Persistent small to medium size right  pleural effusion, decreased in size since 06/01/2021, with significantly improved aeration in the right lung. Electronically Signed   By: Valetta Mole M.D.   On: 06/14/2021 10:25   CT HEAD W & WO CONTRAST (5MM)  Result Date: 07/03/2021 CLINICAL DATA:  66 year old male with multiple falls. Neurologic deficit. Left extremity numbness for 2 days. Cavitary right lung lesion suspicious for lung cancer in October. EXAM: CT HEAD WITHOUT AND WITH CONTRAST TECHNIQUE: Contiguous axial images were obtained from the base of the skull through the vertex without and with intravenous contrast. RADIATION DOSE REDUCTION: This exam was performed according to the  departmental dose-optimization program which includes automated exposure control, adjustment of the mA and/or kV according to patient size and/or use of iterative reconstruction technique. CONTRAST:  99mL OMNIPAQUE IOHEXOL 300 MG/ML  SOLN COMPARISON:  PET-CT 04/04/2021. FINDINGS: Brain: Multifocal cerebral vasogenic edema. Anterior left frontal lobe 3 cm rim enhancing mass (series 7, image 21) with regional vasogenic edema. Posterior right superior frontal gyrus 2.6 cm rim enhancing mass with regional vasogenic edema. This lesion is perirolandic, with enhancement and edema subsequently affecting the left extremity motor and sensory areas. Mass effect on the lateral ventricles, but no midline shift. Basilar cisterns remain patent. No ventriculomegaly. No other No abnormal enhancement identified. No acute intracranial hemorrhage identified. No cortically based acute infarct identified. Vascular: Mild Calcified atherosclerosis at the skull base. Major intracranial vascular structures are enhancing as expected and appear to be patent. Skull: No acute or suspicious osseous lesion identified. Sinuses/Orbits: Visualized paranasal sinuses and mastoids are clear. Other: Postoperative changes to both globes. No acute orbit or scalp soft tissue finding. IMPRESSION: 1. Positive for  Metastatic Disease to the brain. Two rim enhancing masses, up to 3 cm diameter, with vasogenic edema. Superior Right Perirolandic Cortex and anterior left frontal lobe affected. Mass effect on the lateral ventricles but no midline shift and basilar cisterns remain normal. 2. Brain MRI without and with contrast might detect additional lesions. Electronically Signed   By: Genevie Ann M.D.   On: 07/03/2021 05:59   MR BRAIN W WO CONTRAST  Result Date: 07/03/2021 CLINICAL DATA:  Rule out metastatic disease. Squamous cell carcinoma lung. Abnormal head CT. EXAM: MRI HEAD WITHOUT AND WITH CONTRAST TECHNIQUE: Multiplanar, multiecho pulse sequences of the brain and surrounding structures were obtained without and with intravenous contrast. CONTRAST:  8.65mL GADAVIST GADOBUTROL 1 MMOL/ML IV SOLN COMPARISON:  CT head 07/03/2021 FINDINGS: Brain: 3 enhancing lesions in the brain are present compatible with metastatic disease. 2.8 cm left frontal mass with peripheral enhancement and central necrosis. Minimal associated hemorrhage. Moderate surrounding edema with mild midline shift to the right. 2.3 cm lesion in the precentral gyrus on the right. There is central necrosis with internal hemorrhage. Moderate white matter edema and local mass effect. 3 mm enhancing lesion left precentral gyrus without significant edema compatible with metastatic disease. Ventricle size normal.  Negative for acute infarct. Vascular: Normal arterial flow voids Skull and upper cervical spine: Negative Sinuses/Orbits: Paranasal sinuses clear. Bilateral cataract extraction Other: None IMPRESSION: Findings compatible with metastatic disease to brain. Three lesions are identified. Left frontal lesion and right precentral gyrus lesion show significant edema and local mass effect. 3 mm lesion left precentral gyrus. Electronically Signed   By: Franchot Gallo M.D.   On: 07/03/2021 11:36   CT CHEST ABDOMEN PELVIS W CONTRAST  Result Date: 07/03/2021 CLINICAL  DATA:  Brain metastatic disease, status post right lower lobectomy EXAM: CT CHEST, ABDOMEN, AND PELVIS WITH CONTRAST TECHNIQUE: Multidetector CT imaging of the chest, abdomen and pelvis was performed following the standard protocol during bolus administration of intravenous contrast. RADIATION DOSE REDUCTION: This exam was performed according to the departmental dose-optimization program which includes automated exposure control, adjustment of the mA and/or kV according to patient size and/or use of iterative reconstruction technique. CONTRAST:  162mL OMNIPAQUE IOHEXOL 300 MG/ML SOLN, additional oral enteric contrast COMPARISON:  None. FINDINGS: CT CHEST FINDINGS Cardiovascular: Aortic atherosclerosis. Normal heart size. Three-vessel coronary artery calcifications and stents. No pericardial effusion. Mediastinum/Nodes: No enlarged mediastinal, hilar, or axillary lymph nodes. Thyroid gland, trachea, and esophagus demonstrate  no significant findings. Lungs/Pleura: Status post right lower lobectomy. Trace, loculated right pleural effusion, with a small, loculated air component superiorly and dependently (series 5, image 86). Moderate centrilobular emphysema. Clustered centrilobular and tree-in-bud ground-glass opacities in the bilateral lung bases (series 5, image 126). No pleural effusion or pneumothorax. Musculoskeletal: No chest wall mass or suspicious osseous lesions identified. CT ABDOMEN PELVIS FINDINGS Hepatobiliary: No solid liver abnormality is seen. Faintly rim calcified gallstone in the gallbladder. No gallbladder wall thickening, or biliary dilatation. Pancreas: Unremarkable. No pancreatic ductal dilatation or surrounding inflammatory changes. Spleen: Normal in size without significant abnormality. Adrenals/Urinary Tract: Adrenal glands are unremarkable. Kidneys are normal, without renal calculi, solid lesion, or hydronephrosis. Bladder is unremarkable. Stomach/Bowel: Stomach is within normal limits.  Appendix appears normal. No evidence of bowel wall thickening, distention, or inflammatory changes. Descending and sigmoid diverticulosis. Vascular/Lymphatic: Aortic atherosclerosis. No enlarged abdominal or pelvic lymph nodes. Reproductive: No mass or other abnormality. Other: No abdominal wall hernia or abnormality. No ascites. Musculoskeletal: No acute osseous findings. IMPRESSION: 1. Status post right lower lobectomy. 2. No evidence of lymphadenopathy or metastatic disease in the chest, abdomen, or pelvis. Please note, that in order to ensure subspecialty interpretation, restaging examinations for known malignancies should generally be deferred to the nonemergent, outpatient setting. 3. Clustered centrilobular and tree-in-bud ground-glass opacities in the bilateral lung bases. Findings suggest atypical infection or aspiration. 4. Cholelithiasis. 5. Coronary artery disease. Aortic Atherosclerosis (ICD10-I70.0) and Emphysema (ICD10-J43.9). Electronically Signed   By: Delanna Ahmadi M.D.   On: 07/03/2021 13:29   DG Chest Port 1 View  Result Date: 07/03/2021 CLINICAL DATA:  Left arm and leg numbness.  Multiple falls. EXAM: PORTABLE CHEST 1 VIEW COMPARISON:  06/14/2021. FINDINGS: The heart size and mediastinal contours are within normal limits. Atherosclerotic calcification of the aorta is noted. There is a persistent small to moderate right pleural effusion with mild atelectasis at the right lung base. The left lung is clear. No pneumothorax. Shoulder arthroplasty changes are noted on the right. IMPRESSION: Persistent small to moderate right pleural effusion, not significantly changed from the prior exam. Electronically Signed   By: Brett Fairy M.D.   On: 07/03/2021 04:56     Subjective: Patient seen examined at bedside, resting comfortably.  Left lower extremity weakness improved, continues with left upper extremity weakness.  Seen by and radiation oncology with recommendations of outpatient follow-up with  3T MRI brain and likely planning stereotactic radiosurgery outpatient.  Request outpatient PT/OT and three-point cane.  No other questions or concerns at this time.  Also discussed with patient will discharge on insulin pen for coverage given start of Decadron for his underlying brain mets/vasogenic edema.  Denies headache, no visual changes, no chest pain, no shortness of breath, no fever/chills/night sweats, no nausea/vomiting/diarrhea, no abdominal pain, no paresthesias, no fatigue.  No acute events overnight per nurse staff.  Discharge Exam: Vitals:   07/05/21 0500 07/05/21 1000  BP: 115/69 127/74  Pulse: 98 68  Resp: 18 (!) 22  Temp: 98 F (36.7 C) 98.2 F (36.8 C)  SpO2: 98% 92%   Vitals:   07/04/21 1120 07/04/21 2136 07/05/21 0500 07/05/21 1000  BP: 101/73 104/71 115/69 127/74  Pulse: (!) 106 (!) 102 98 68  Resp: 16 17 18  (!) 22  Temp: (!) 97.5 F (36.4 C) 97.9 F (36.6 C) 98 F (36.7 C) 98.2 F (36.8 C)  TempSrc: Oral Oral Oral Oral  SpO2: 97% 96% 98% 92%  Weight:    83.7 kg  Height:    6\' 1"  (1.854 m)    General: Pt is alert, awake, not in acute distress Cardiovascular: RRR, S1/S2 +, no rubs, no gallops Respiratory: CTA bilaterally, no wheezing, no rhonchi, on room air Abdominal: Soft, NT, ND, bowel sounds + Extremities: no edema, no cyanosis  Neuro Exam Mental Status: A&O x4, no dysarthria, no aphasia Cranial Nerves: visual fields full, PERRL, EOMi, intact smooth pursuit, no nystagmus, no ptosis, facial sensation intact bilaterally, 5/5 jaw strength, nasolabial fold & smile symetric,  eyebrow  raise & 5/5 eye closure symetric, hearing symmetric and normal to rubbing fingers, palate elevates symmetrically, head turning and shoulder shrug intact and symetric bilaterally, tongue protrusion is midline Motor: no pronator drift, LUE 1/5,   LLE 4/5,   RUE 5/5,   RLE 5/5   normal muscle bulk no significant atrophy, normal tone, no spasticity or rigidity apperciated  Sensory:  Sensation is intact to light touch, temp, pain, vibration, joint position sense in all 4 ext Coordination/Movement: no tremor noted, no dysmetria  The results of significant diagnostics from this hospitalization (including imaging, microbiology, ancillary and laboratory) are listed below for reference.     Microbiology: Recent Results (from the past 240 hour(s))  Resp Panel by RT-PCR (Flu A&B, Covid) Nasopharyngeal Swab     Status: None   Collection Time: 07/02/21  9:17 PM   Specimen: Nasopharyngeal Swab; Nasopharyngeal(NP) swabs in vial transport medium  Result Value Ref Range Status   SARS Coronavirus 2 by RT PCR NEGATIVE NEGATIVE Final    Comment: (NOTE) SARS-CoV-2 target nucleic acids are NOT DETECTED.  The SARS-CoV-2 RNA is generally detectable in upper respiratory specimens during the acute phase of infection. The lowest concentration of SARS-CoV-2 viral copies this assay can detect is 138 copies/mL. A negative result does not preclude SARS-Cov-2 infection and should not be used as the sole basis for treatment or other patient management decisions. A negative result may occur with  improper specimen collection/handling, submission of specimen other than nasopharyngeal swab, presence of viral mutation(s) within the areas targeted by this assay, and inadequate number of viral copies(<138 copies/mL). A negative result must be combined with clinical observations, patient history, and epidemiological information. The expected result is Negative.  Fact Sheet for Patients:  EntrepreneurPulse.com.au  Fact Sheet for Healthcare Providers:  IncredibleEmployment.be  This test is no t yet approved or cleared by the [place] FDA and  has been authorized for detection and/or diagnosis of SARS-CoV-2 by FDA under an Emergency Use Authorization (EUA). This EUA will remain  in effect (meaning this test can be used) for the duration of the COVID-19  declaration under Section 564(b)(1) of the Act, 21 U.S.C.section 360bbb-3(b)(1), unless the authorization is terminated  or revoked sooner.       Influenza A by PCR NEGATIVE NEGATIVE Final   Influenza B by PCR NEGATIVE NEGATIVE Final    Comment: (NOTE) The Xpert Xpress SARS-CoV-2/FLU/RSV plus assay is intended as an aid in the diagnosis of influenza from Nasopharyngeal swab specimens and should not be used as a sole basis for treatment. Nasal washings and aspirates are unacceptable for Xpert Xpress SARS-CoV-2/FLU/RSV testing.  Fact Sheet for Patients: EntrepreneurPulse.com.au  Fact Sheet for Healthcare Providers: IncredibleEmployment.be  This test is not yet approved or cleared by the [place] FDA and has been authorized for detection and/or diagnosis of SARS-CoV-2 by FDA under an Emergency Use Authorization (EUA). This EUA will remain in effect (meaning this test can be used) for the duration of  the COVID-19 declaration under Section 564(b)(1) of the Act, 21 U.S.C. section 360bbb-3(b)(1), unless the authorization is terminated or revoked.  Performed at [facility], [place] [address] [place], [place] [address]      Labs: BNP (last 3 results) No results for input(s): BNP in the last 8760 hours. Basic Metabolic Panel: Recent Labs  Lab 07/02/21 2003 07/02/21 2024 07/04/21 0352  NA 136 137 133*  K 4.3 4.2 3.9  CL 103 102 102  CO2 23  --  24  GLUCOSE 133* 124* 156*  BUN 12 12 17   CREATININE 0.70 0.70 0.53*  CALCIUM 9.3  --  8.8*   Liver Function Tests: Recent Labs  Lab 07/02/21 2003  AST 20  ALT 20  ALKPHOS 82  BILITOT 0.5  PROT 7.7  ALBUMIN 3.7   No results for input(s): LIPASE, AMYLASE in the last 168 hours. No results for input(s): AMMONIA in the last 168 hours. CBC: Recent Labs  Lab 07/02/21 2003 07/02/21 2024 07/04/21 0352  WBC 13.5*  --  10.2  NEUTROABS 8.5*  --   --   HGB 13.2 13.9 11.5*  HCT 40.2  41.0 35.1*  MCV 84.6  --  82.8  PLT 353  --  272   Cardiac Enzymes: No results for input(s): CKTOTAL, CKMB, CKMBINDEX, TROPONINI in the last 168 hours. BNP: Invalid input(s): POCBNP CBG: Recent Labs  Lab 07/03/21 1608 07/04/21 1118 07/04/21 1622 07/04/21 2138 07/05/21 0741  GLUCAP 211* 187* 187* 222* 154*   D-Dimer No results for input(s): DDIMER in the last 72 hours. Hgb A1c No results for input(s): HGBA1C in the last 72 hours. Lipid Profile No results for input(s): CHOL, HDL, LDLCALC, TRIG, CHOLHDL, LDLDIRECT in the last 72 hours. Thyroid function studies No results for input(s): TSH, T4TOTAL, T3FREE, THYROIDAB in the last 72 hours.  Invalid input(s): FREET3 Anemia work up No results for input(s): VITAMINB12, FOLATE, FERRITIN, TIBC, IRON, RETICCTPCT in the last 72 hours. Urinalysis    Component Value Date/Time   COLORURINE YELLOW 07/02/2021 0045   APPEARANCEUR HAZY (A) 07/02/2021 0045   LABSPEC 1.023 07/02/2021 0045   PHURINE 5.0 07/02/2021 0045   GLUCOSEU NEGATIVE 07/02/2021 0045   HGBUR NEGATIVE 07/02/2021 0045   BILIRUBINUR NEGATIVE 07/02/2021 0045   KETONESUR NEGATIVE 07/02/2021 0045   PROTEINUR NEGATIVE 07/02/2021 0045   NITRITE NEGATIVE 07/02/2021 0045   LEUKOCYTESUR TRACE (A) 07/02/2021 0045   Sepsis Labs Invalid input(s): PROCALCITONIN,  WBC,  LACTICIDVEN Microbiology Recent Results (from the past 240 hour(s))  Resp Panel by RT-PCR (Flu A&B, Covid) Nasopharyngeal Swab     Status: None   Collection Time: 07/02/21  9:17 PM   Specimen: Nasopharyngeal Swab; Nasopharyngeal(NP) swabs in vial transport medium  Result Value Ref Range Status   SARS Coronavirus 2 by RT PCR NEGATIVE NEGATIVE Final    Comment: (NOTE) SARS-CoV-2 target nucleic acids are NOT DETECTED.  The SARS-CoV-2 RNA is generally detectable in upper respiratory specimens during the acute phase of infection. The lowest concentration of SARS-CoV-2 viral copies this assay can detect is 138  copies/mL. A negative result does not preclude SARS-Cov-2 infection and should not be used as the sole basis for treatment or other patient management decisions. A negative result may occur with  improper specimen collection/handling, submission of specimen other than nasopharyngeal swab, presence of viral mutation(s) within the areas targeted by this assay, and inadequate number of viral copies(<138 copies/mL). A negative result must be combined with clinical observations, patient history, and epidemiological  information. The expected result is Negative.  Fact Sheet for Patients:  EntrepreneurPulse.com.au  Fact Sheet for Healthcare Providers:  IncredibleEmployment.be  This test is no t yet approved or cleared by the [place] FDA and  has been authorized for detection and/or diagnosis of SARS-CoV-2 by FDA under an Emergency Use Authorization (EUA). This EUA will remain  in effect (meaning this test can be used) for the duration of the COVID-19 declaration under Section 564(b)(1) of the Act, 21 U.S.C.section 360bbb-3(b)(1), unless the authorization is terminated  or revoked sooner.       Influenza A by PCR NEGATIVE NEGATIVE Final   Influenza B by PCR NEGATIVE NEGATIVE Final    Comment: (NOTE) The Xpert Xpress SARS-CoV-2/FLU/RSV plus assay is intended as an aid in the diagnosis of influenza from Nasopharyngeal swab specimens and should not be used as a sole basis for treatment. Nasal washings and aspirates are unacceptable for Xpert Xpress SARS-CoV-2/FLU/RSV testing.  Fact Sheet for Patients: EntrepreneurPulse.com.au  Fact Sheet for Healthcare Providers: IncredibleEmployment.be  This test is not yet approved or cleared by the [place] FDA and has been authorized for detection and/or diagnosis of SARS-CoV-2 by FDA under an Emergency Use Authorization (EUA). This EUA will remain in effect (meaning  this test can be used) for the duration of the COVID-19 declaration under Section 564(b)(1) of the Act, 21 U.S.C. section 360bbb-3(b)(1), unless the authorization is terminated or revoked.  Performed at [facility], [place] [address], [place], [place] [address]      Time coordinating discharge: Over 30 minutes  SIGNED:   Donnamarie Poag [place] DO  [facility] 07/05/2021, 11:16 AM

## 2021-07-05 NOTE — TOC Transition Note (Signed)
Transition of Care [facility]) - CM/SW Discharge Note   Patient Details  Name: Stephen Diaz MRN: [id number] Date of Birth: [birth year]  [facility]) CM/SW Contact:  Leeroy Cha, RN Phone Number: 07/05/2021, 12:27 PM   Clinical Narrative:    Pt dcd to home with dme 3 in1, tub bench and three prong cane.    Final next level of care: Home/Self Care Barriers to Discharge: Barriers Resolved   Patient Goals and CMS Choice Patient states their goals for this hospitalization and ongoing recovery are:: to go home CMS Medicare.gov Compare Post Acute Care list provided to:: Patient    Discharge Placement                       Discharge Plan and Services   Discharge Planning Services: CM Consult Post Acute Care Choice: Durable Medical Equipment          DME Arranged: Kasandra Knudsen DME Agency: [employer] Date DME Agency Contacted: 07/05/21 Time DME Agency Contacted: [phone] Representative spoke with at DME Agency: danielle            Social Determinants of Health ([place]) Interventions     Readmission Risk Interventions No flowsheet data found.

## 2021-07-06 ENCOUNTER — Other Ambulatory Visit: Payer: Self-pay | Admitting: Physician Assistant

## 2021-07-06 ENCOUNTER — Emergency Department (HOSPITAL_COMMUNITY): Payer: Medicare Other

## 2021-07-06 ENCOUNTER — Other Ambulatory Visit: Payer: Self-pay

## 2021-07-06 ENCOUNTER — Encounter (HOSPITAL_COMMUNITY): Payer: Self-pay

## 2021-07-06 ENCOUNTER — Inpatient Hospital Stay (HOSPITAL_COMMUNITY)
Admission: EM | Admit: 2021-07-06 | Discharge: 2021-07-19 | DRG: 040 | Disposition: A | Discharge status: Skilled Nursing Facility | Payer: Medicare Other | Attending: Family Medicine | Admitting: Family Medicine

## 2021-07-06 DIAGNOSIS — L89891 Pressure ulcer of other site, stage 1: Secondary | ICD-10-CM | POA: Diagnosis present

## 2021-07-06 DIAGNOSIS — R29810 Facial weakness: Secondary | ICD-10-CM | POA: Diagnosis present

## 2021-07-06 DIAGNOSIS — R4781 Slurred speech: Secondary | ICD-10-CM | POA: Diagnosis present

## 2021-07-06 DIAGNOSIS — N400 Enlarged prostate without lower urinary tract symptoms: Secondary | ICD-10-CM | POA: Diagnosis present

## 2021-07-06 DIAGNOSIS — E1141 Type 2 diabetes mellitus with diabetic mononeuropathy: Secondary | ICD-10-CM | POA: Diagnosis present

## 2021-07-06 DIAGNOSIS — R531 Weakness: Secondary | ICD-10-CM | POA: Diagnosis not present

## 2021-07-06 DIAGNOSIS — G8194 Hemiplegia, unspecified affecting left nondominant side: Secondary | ICD-10-CM | POA: Diagnosis present

## 2021-07-06 DIAGNOSIS — C7931 Secondary malignant neoplasm of brain: Principal | ICD-10-CM | POA: Diagnosis present

## 2021-07-06 DIAGNOSIS — M21371 Foot drop, right foot: Secondary | ICD-10-CM | POA: Diagnosis present

## 2021-07-06 DIAGNOSIS — D496 Neoplasm of unspecified behavior of brain: Secondary | ICD-10-CM | POA: Diagnosis not present

## 2021-07-06 DIAGNOSIS — E785 Hyperlipidemia, unspecified: Secondary | ICD-10-CM | POA: Diagnosis present

## 2021-07-06 DIAGNOSIS — R29818 Other symptoms and signs involving the nervous system: Secondary | ICD-10-CM

## 2021-07-06 DIAGNOSIS — E119 Type 2 diabetes mellitus without complications: Secondary | ICD-10-CM

## 2021-07-06 DIAGNOSIS — L89301 Pressure ulcer of unspecified buttock, stage 1: Secondary | ICD-10-CM | POA: Diagnosis present

## 2021-07-06 DIAGNOSIS — R471 Dysarthria and anarthria: Secondary | ICD-10-CM | POA: Diagnosis present

## 2021-07-06 DIAGNOSIS — I10 Essential (primary) hypertension: Secondary | ICD-10-CM | POA: Diagnosis present

## 2021-07-06 DIAGNOSIS — Z96611 Presence of right artificial shoulder joint: Secondary | ICD-10-CM | POA: Diagnosis present

## 2021-07-06 DIAGNOSIS — E871 Hypo-osmolality and hyponatremia: Secondary | ICD-10-CM | POA: Diagnosis not present

## 2021-07-06 DIAGNOSIS — Z7984 Long term (current) use of oral hypoglycemic drugs: Secondary | ICD-10-CM

## 2021-07-06 DIAGNOSIS — M1990 Unspecified osteoarthritis, unspecified site: Secondary | ICD-10-CM | POA: Diagnosis present

## 2021-07-06 DIAGNOSIS — Z794 Long term (current) use of insulin: Secondary | ICD-10-CM

## 2021-07-06 DIAGNOSIS — Z7982 Long term (current) use of aspirin: Secondary | ICD-10-CM

## 2021-07-06 DIAGNOSIS — Z87891 Personal history of nicotine dependence: Secondary | ICD-10-CM

## 2021-07-06 DIAGNOSIS — Z20822 Contact with and (suspected) exposure to covid-19: Secondary | ICD-10-CM | POA: Diagnosis present

## 2021-07-06 DIAGNOSIS — Z7951 Long term (current) use of inhaled steroids: Secondary | ICD-10-CM

## 2021-07-06 DIAGNOSIS — G936 Cerebral edema: Secondary | ICD-10-CM | POA: Diagnosis present

## 2021-07-06 DIAGNOSIS — Z66 Do not resuscitate: Secondary | ICD-10-CM | POA: Diagnosis not present

## 2021-07-06 DIAGNOSIS — C3491 Malignant neoplasm of unspecified part of right bronchus or lung: Secondary | ICD-10-CM | POA: Diagnosis present

## 2021-07-06 DIAGNOSIS — J449 Chronic obstructive pulmonary disease, unspecified: Secondary | ICD-10-CM | POA: Diagnosis present

## 2021-07-06 DIAGNOSIS — Z79899 Other long term (current) drug therapy: Secondary | ICD-10-CM

## 2021-07-06 LAB — CBC
HCT: 42.3 % (ref 39.0–52.0)
Hemoglobin: 13.5 g/dL (ref 13.0–17.0)
MCH: 27.6 pg (ref 26.0–34.0)
MCHC: 31.9 g/dL (ref 30.0–36.0)
MCV: 86.5 fL (ref 80.0–100.0)
Platelets: 277 10*3/uL (ref 150–400)
RBC: 4.89 MIL/uL (ref 4.22–5.81)
RDW: 14.4 % (ref 11.5–15.5)
WBC: 16.3 10*3/uL — ABNORMAL HIGH (ref 4.0–10.5)
nRBC: 0 % (ref 0.0–0.2)

## 2021-07-06 LAB — COMPREHENSIVE METABOLIC PANEL
ALT: 36 U/L (ref 0–44)
AST: 28 U/L (ref 15–41)
Albumin: 3.8 g/dL (ref 3.5–5.0)
Alkaline Phosphatase: 65 U/L (ref 38–126)
Anion gap: 11 (ref 5–15)
BUN: 25 mg/dL — ABNORMAL HIGH (ref 8–23)
CO2: 24 mmol/L (ref 22–32)
Calcium: 9.2 mg/dL (ref 8.9–10.3)
Chloride: 100 mmol/L (ref 98–111)
Creatinine, Ser: 0.65 mg/dL (ref 0.61–1.24)
GFR, Estimated: >60 mL/min (ref >60)
Glucose, Bld: 126 mg/dL — ABNORMAL HIGH (ref 70–99)
Potassium: 3.9 mmol/L (ref 3.5–5.1)
Sodium: 135 mmol/L (ref 135–145)
Total Bilirubin: 0.8 mg/dL (ref 0.3–1.2)
Total Protein: 7.4 g/dL (ref 6.5–8.1)

## 2021-07-06 LAB — RESP PANEL BY RT-PCR (FLU A&B, COVID) ARPGX2
Influenza A by PCR: NEGATIVE
Influenza B by PCR: NEGATIVE
SARS Coronavirus 2 by RT PCR: NEGATIVE

## 2021-07-06 LAB — GLUCOSE, CAPILLARY: Glucose-Capillary: 176 mg/dL — ABNORMAL HIGH (ref 70–99)

## 2021-07-06 LAB — CBG MONITORING, ED: Glucose-Capillary: 117 mg/dL — ABNORMAL HIGH (ref 70–99)

## 2021-07-06 IMAGING — MR MR HEAD W/O CM
10 series · 47 of 48 positions shown · non-contrast
Comparison: Same-day noncontrast head CT, MR head [DATE]

CLINICAL DATA: Progressive lower extremity weakness, known brain
Mets from squamous cell lung cancer

EXAM:
MRI HEAD WITHOUT CONTRAST
TECHNIQUE: Multiplanar, multiecho pulse sequences of the brain and surrounding
structures were obtained without intravenous contrast.

[Series 5: DWI · axial · 3.0mm · 1.36mm/px · z∈[-55,+107]mm · 9 of 112 slices shown (1 of 2)]
[im 1/112]
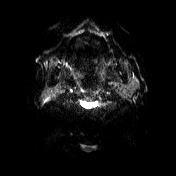
[im 14/112]
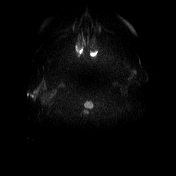
[im 28/112]
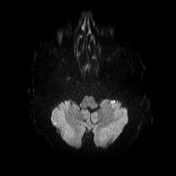
[im 42/112]
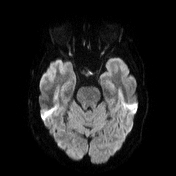
[im 56/112]
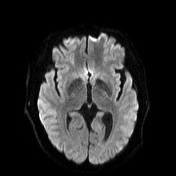
[im 70/112]
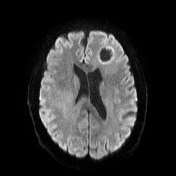
[im 84/112]
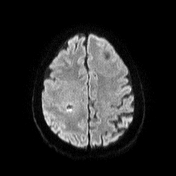
[im 98/112]
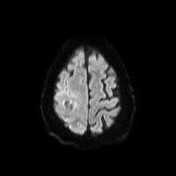
[im 112/112]
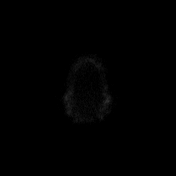

[Series 6: DWI · axial · 3.0mm · 1.36mm/px · z∈[-55,+107]mm · 4 of 54 slices shown (2 of 2)]
[im 1/54]
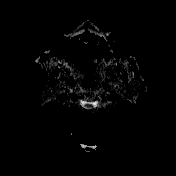
[im 18/54]
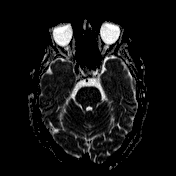
[im 36/54]
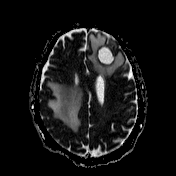
[im 54/54]
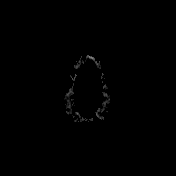

[Series 7: T1 · sagittal · 5.0mm · 0.75mm/px · 2 of 24 slices shown (1 of 2)]
[im 1/24]
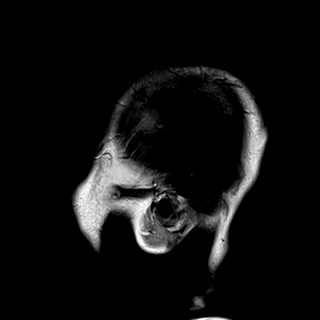
[im 24/24]
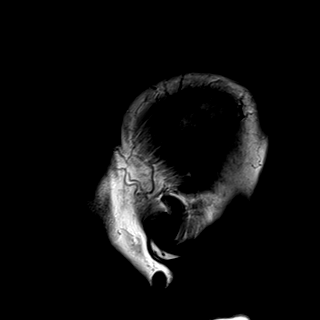

[Series 8: T2 · axial · 5.0mm · 0.62mm/px · z∈[-50,+110]mm · 2 of 26 slices shown (1 of 2)]
[im 1/26]
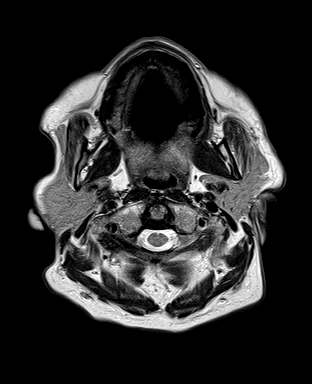
[im 26/26]
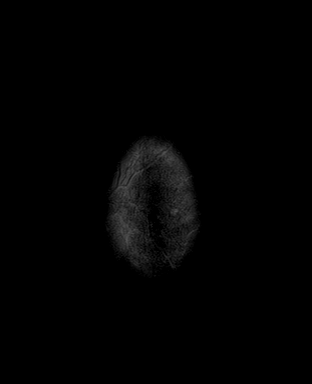

[Series 9: swi_images · axial · 3.0mm · 0.87mm/px · z∈[-50,+112]mm · 4 of 56 slices shown]
[im 1/56]
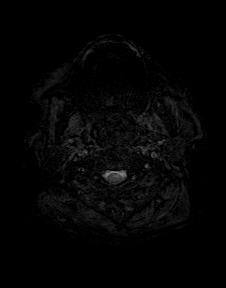
[im 19/56]
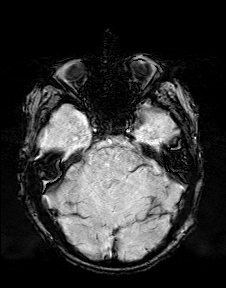
[im 37/56]
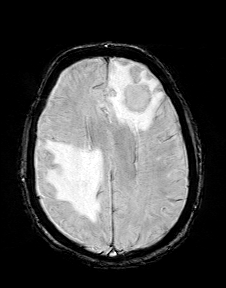
[im 56/56]
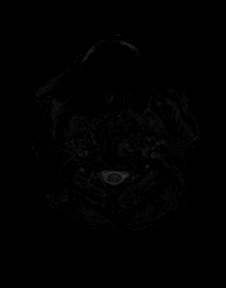

[Series 11: FLAIR · axial · 3.0mm · 0.77mm/px · z∈[-45,+106]mm · 4 of 52 slices shown]
[im 1/52]
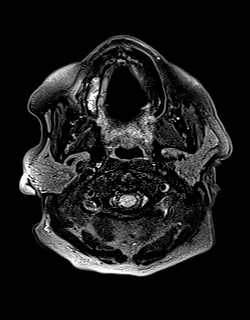
[im 18/52]
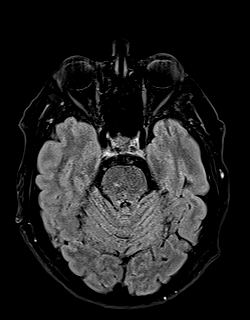
[im 35/52]
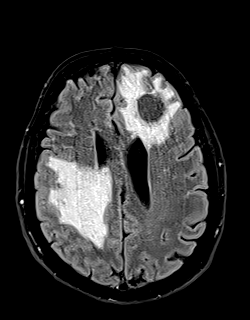
[im 52/52]
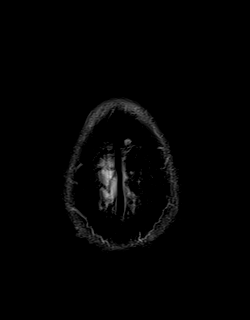

[Series 12: T1 · axial · 1.0mm · 0.47mm/px · z∈[-49,+107]mm · 12 of 160 slices shown (2 of 2)]
[im 1/160]
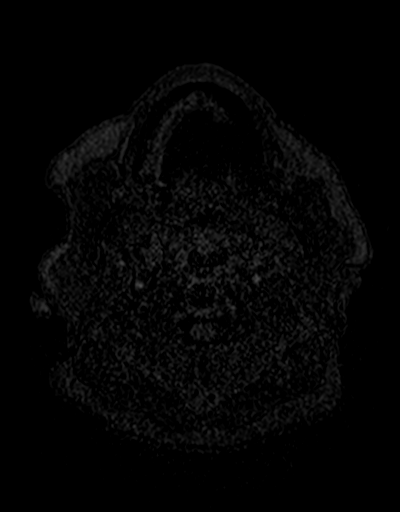
[im 14/160]
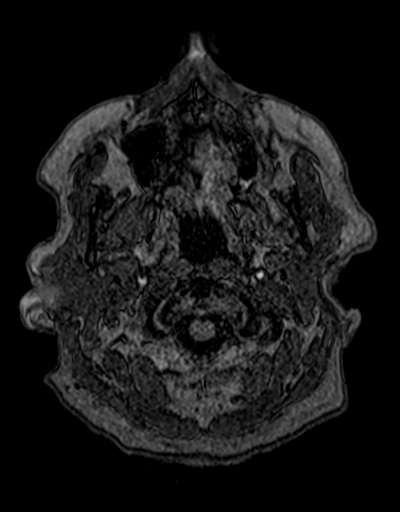
[im 27/160]
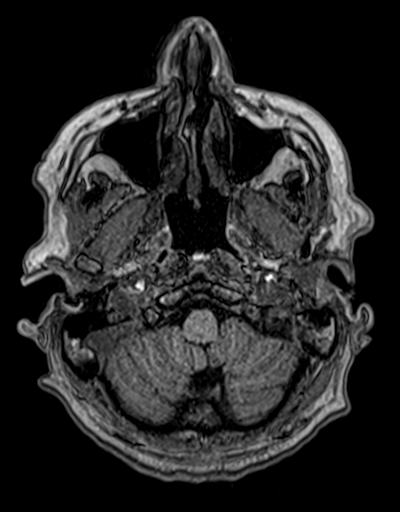
[im 40/160]
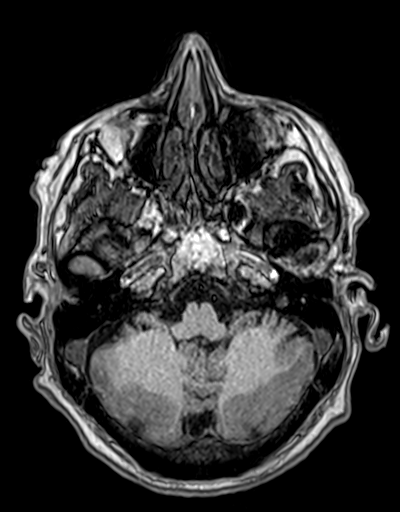
[im 54/160]
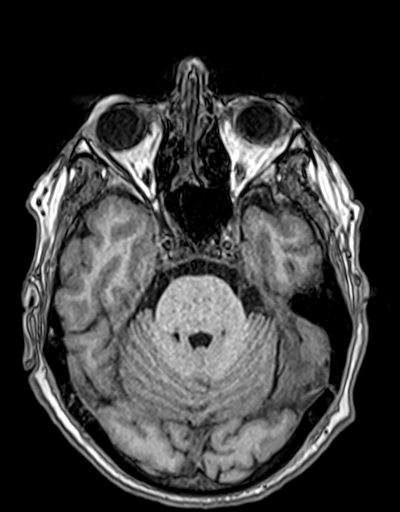
[im 67/160]
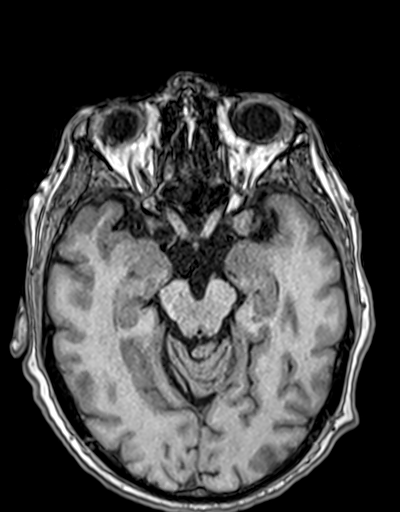
[im 80/160]
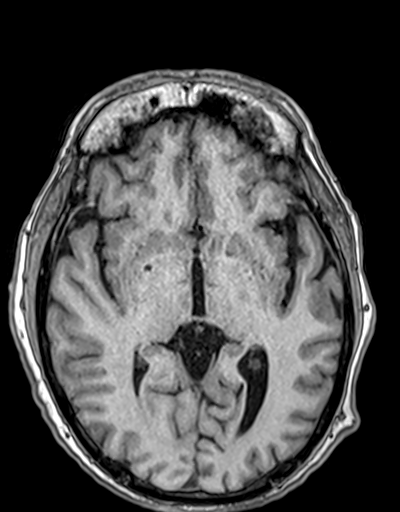
[im 93/160]
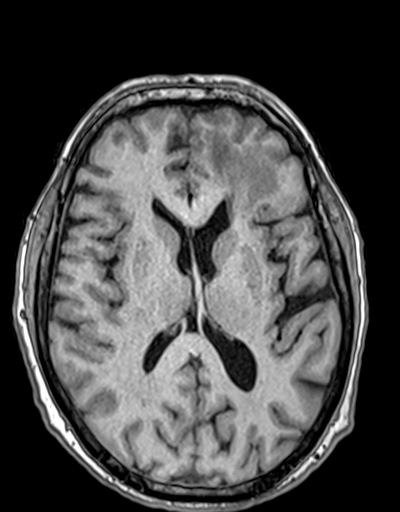
[im 107/160]
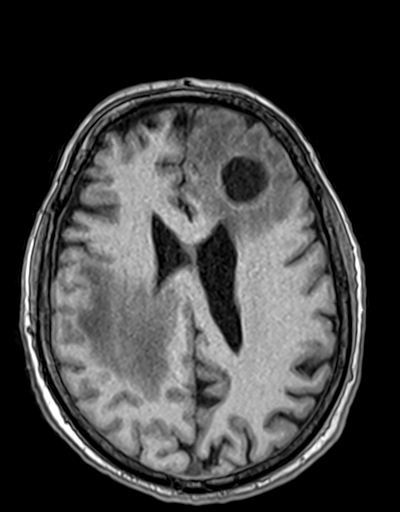
[im 120/160]
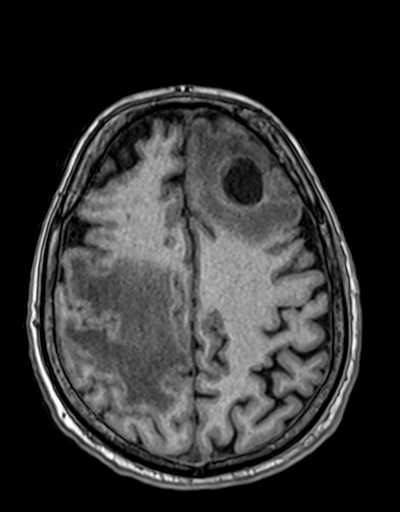
[im 133/160]
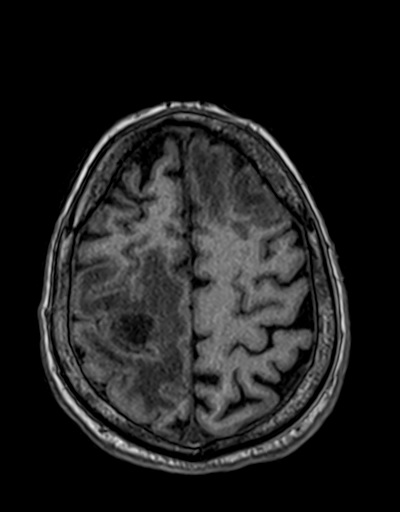
[im 160/160]
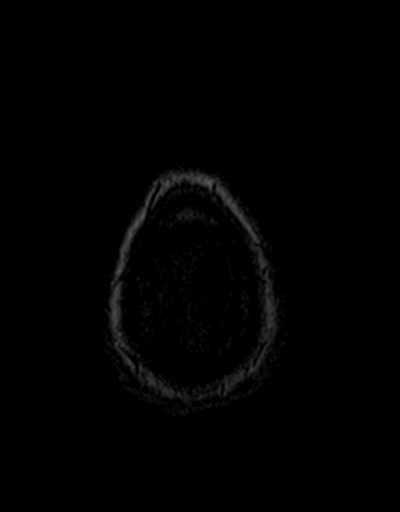

[Series 13: cor dwi_tracew · coronal · 5.0mm · 1.53mm/px · 5 of 60 slices shown]
[im 1/60]
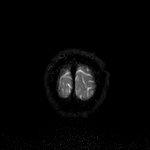
[im 15/60]
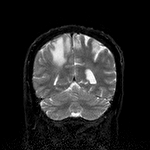
[im 30/60]
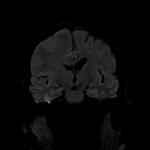
[im 45/60]
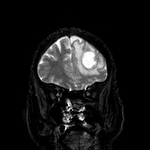
[im 60/60]
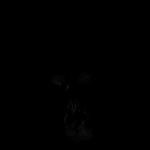

[Series 14: cor dwi_adc · coronal · 5.0mm · 1.53mm/px · 2 of 30 slices shown]
[im 1/30]
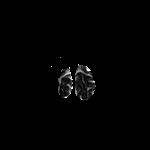
[im 30/30]
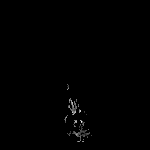

[Series 15: T2 · coronal · 5.0mm · 0.69mm/px · 3 of 36 slices shown (2 of 2)]
[im 1/36]
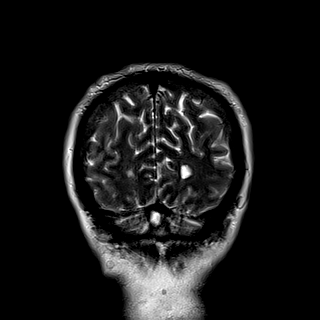
[im 18/36]
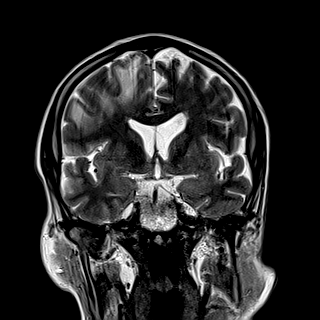
[im 36/36]
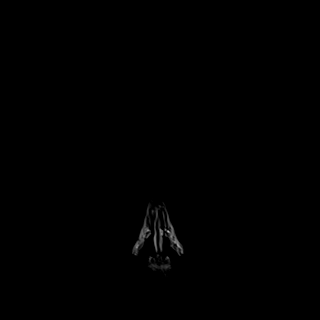

[47 of 48 positions shown; findings below may reference images not displayed]

FINDINGS: Brain: There is no evidence of acute intracranial hemorrhage,
extra-axial fluid collection, or acute infarct.

Again seen are metastatic lesions in the left frontal lobe centered
at the middle frontal gyrus and right precentral gyrus, unchanged in
size compared to the recent study from [DATE] measured on the
noncontrast T1 sequence. SWI signal dropout within the right frontal
lesion is unchanged, consistent with blood products. Perilesional
edema in the left frontal lobe and right frontal and parietal lobes
extending inferiorly to the right corona radiata is unchanged. There
is partial effacement of the right lateral ventricle but no midline
shift, unchanged.

The additional 3 mm enhancing lesion seen in the left precentral
gyrus is not seen on the current study in the absence of intravenous
contrast.

Vascular: Normal flow voids.

Skull and upper cervical spine: Normal marrow signal. Upper cervical
spine fusion hardware is partially imaged.

Sinuses/Orbits: The paranasal sinuses are clear. Bilateral lens
implants are in place. The globes and orbits are otherwise
unremarkable.

Other: There is a partially imaged T2 hyperintense lesion in the
left neck just inferolateral to the parotid gland. There is
associated diffusion restriction (13-47). This lesion was present on
the PET-CT from [DATE] and was not hypermetabolic.
IMPRESSION: 1. Unchanged size and noncontrast appearance of the metastatic
lesions in the bilateral frontal lobes as above with unchanged
extensive perilesional edema but no midline shift.
2. No evidence of acute intracranial hemorrhage or infarct.
3. Partially imaged cystic lesion in the left neck inferolateral to
the parotid gland was present on the prior PET-CT from [DATE]
and was not hypermetabolic on that study. This may reflect a
sebaceous cyst; however, a necrotic lymph node can not be entirely
excluded. Recommend attention on follow-up PET-CTs.

## 2021-07-06 IMAGING — CT CT HEAD W/O CM
4 series · 16 of 47 positions shown, 18 images · non-contrast
Comparison: Brain MRI from 3 days ago

CLINICAL DATA: Fall from chair. Weakness of left extremity for 2
weeks



[Series 2: head wo · axial · 0.47mm/px · z∈[-119,+1]mm · 7 of 33 slices shown, 9 images]
[im 5/33  brain]
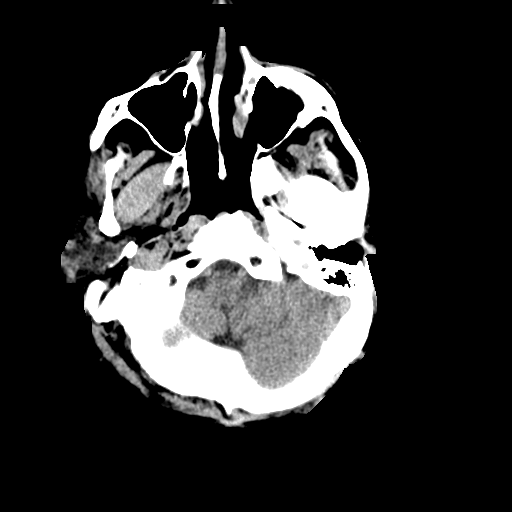
[im 5/33  bone]
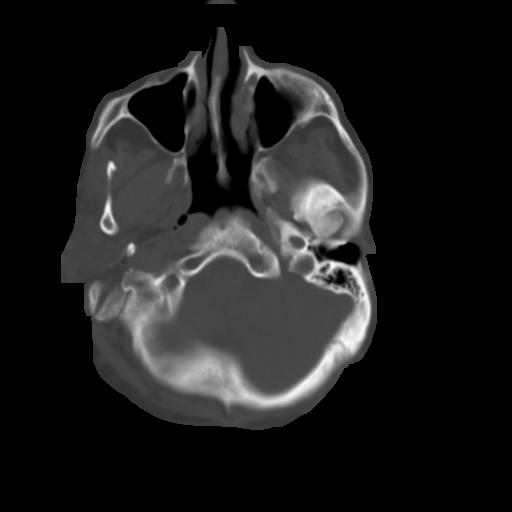
[im 9/33  brain]
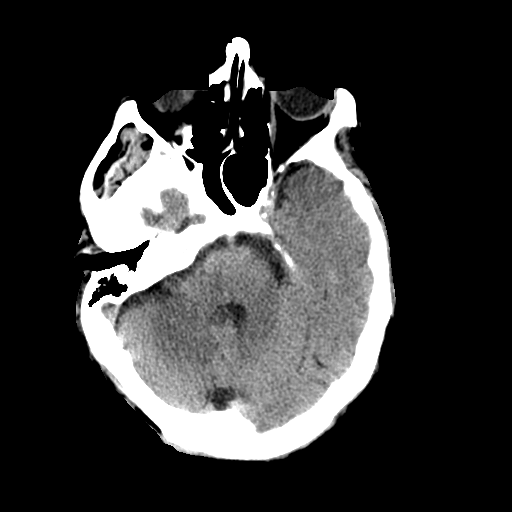
[im 13/33  brain]
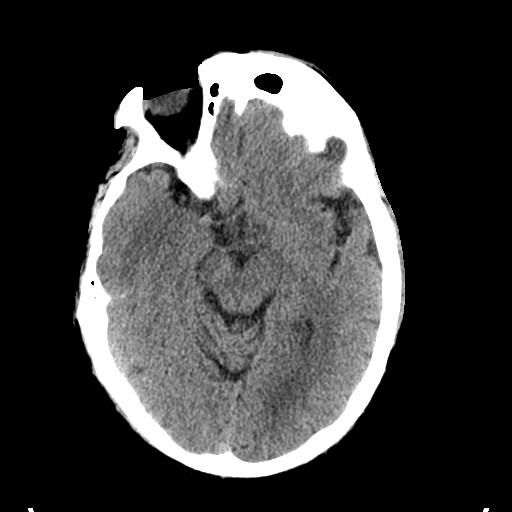
[im 17/33  brain]
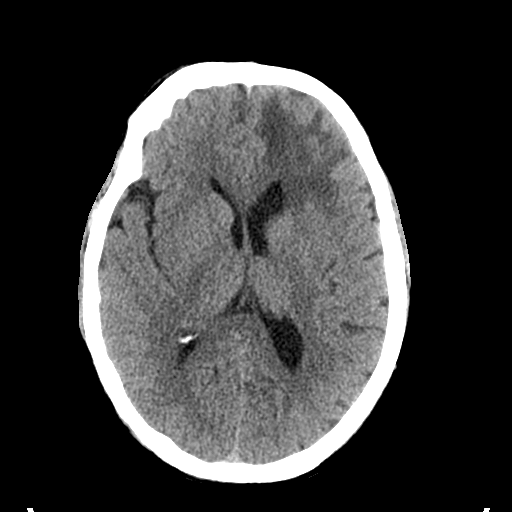
[im 21/33  brain]
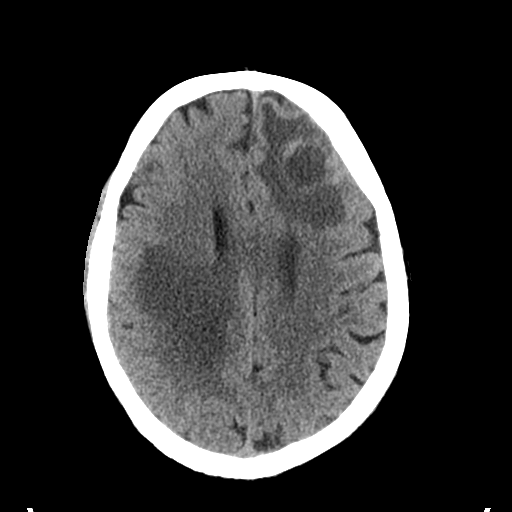
[im 21/33  bone]
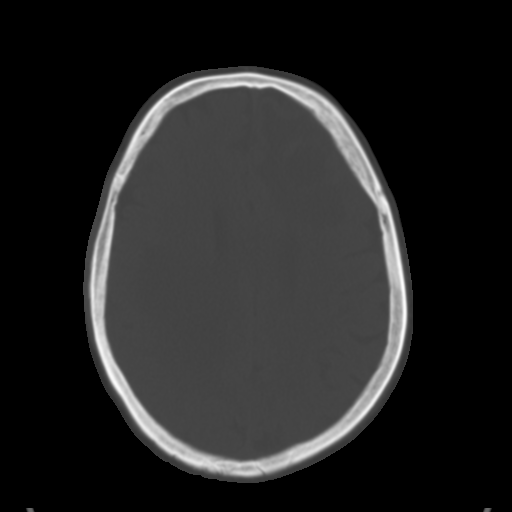
[im 25/33  brain]
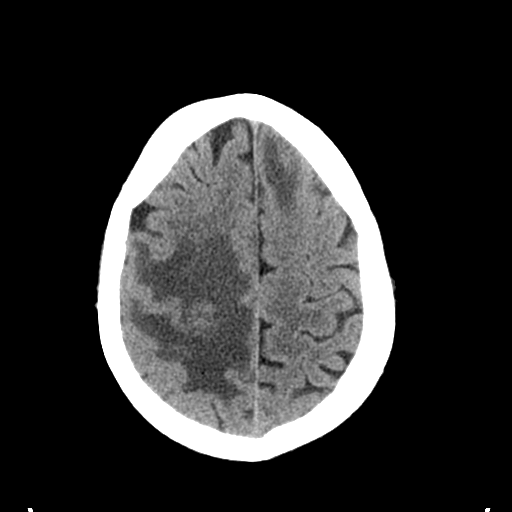
[im 29/33  brain]
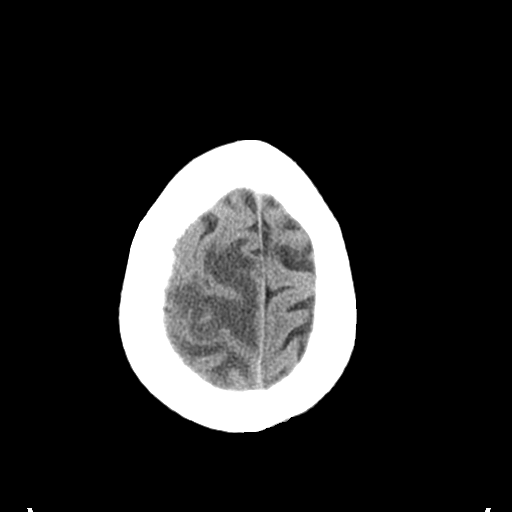

[Series 3: head bone · axial · 0.47mm/px · z∈[-123,-91]mm · 3 of 83 slices shown]
[im 9/83  bone]
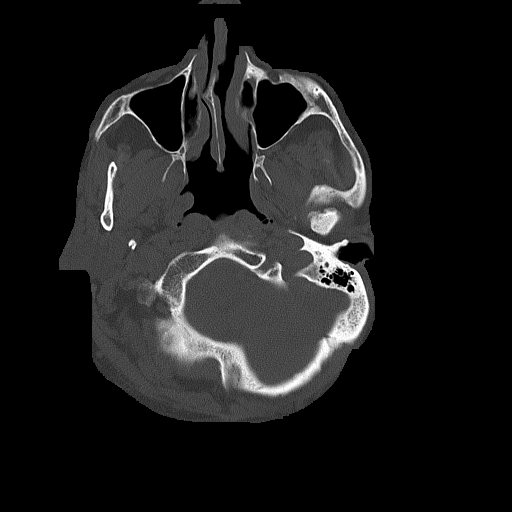
[im 17/83  bone]
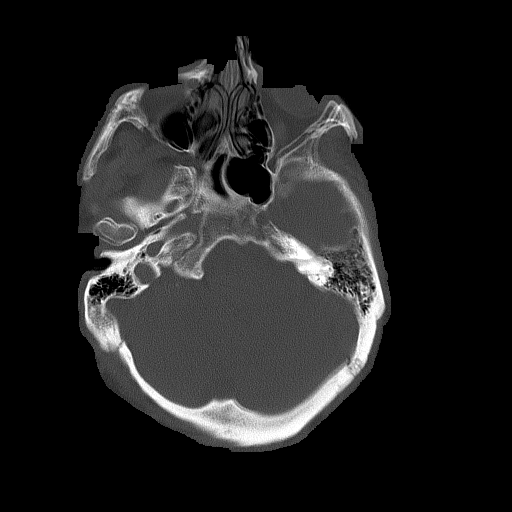
[im 25/83  bone]
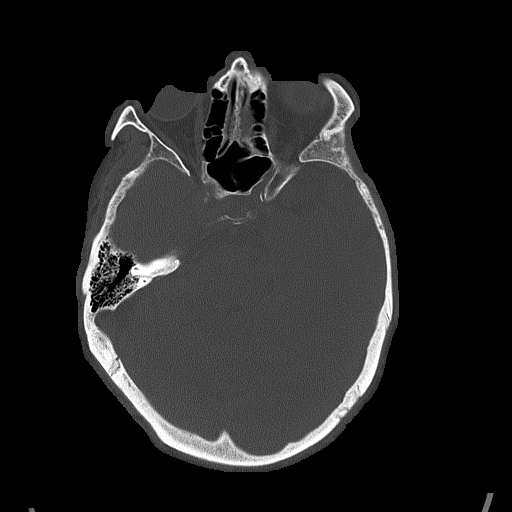

[Series 4: coronal soft tissue · coronal · 0.32mm/px · 3 of 69 slices shown]
[im 23/69  brain]
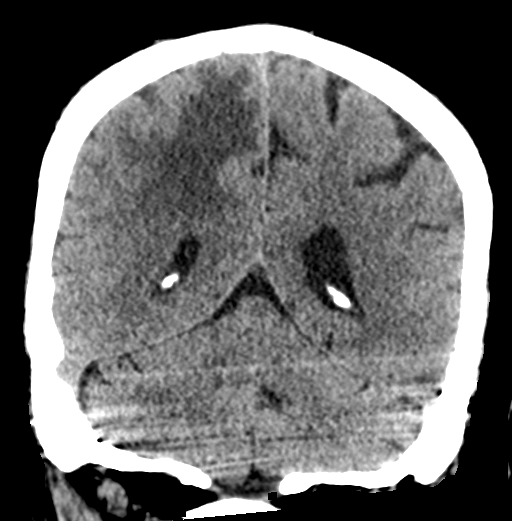
[im 31/69  brain]
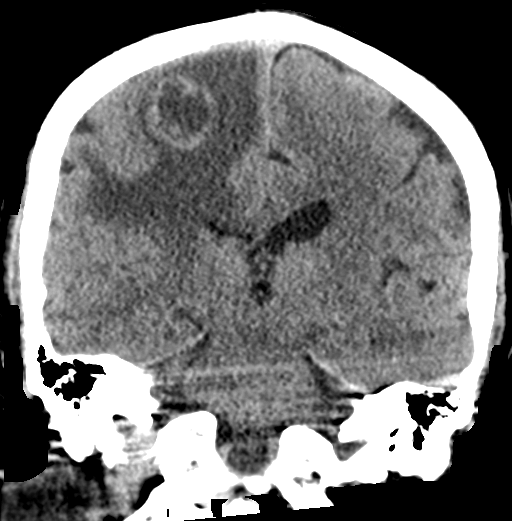
[im 38/69  brain]
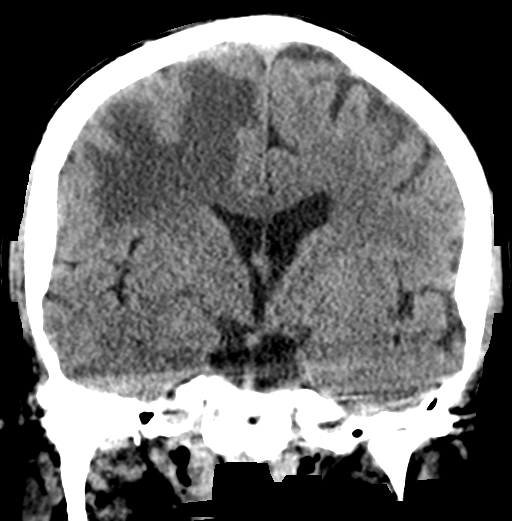

[Series 5: sagittal soft tissue · sagittal · 0.32mm/px · 3 of 53 slices shown]
[im 18/53  brain]
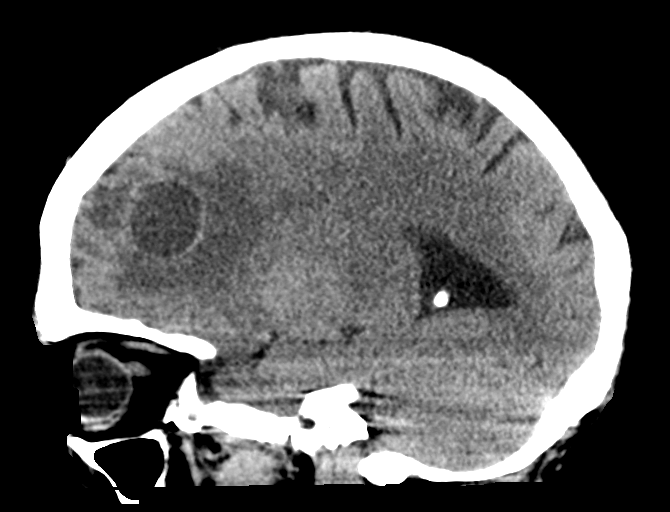
[im 27/53  brain]
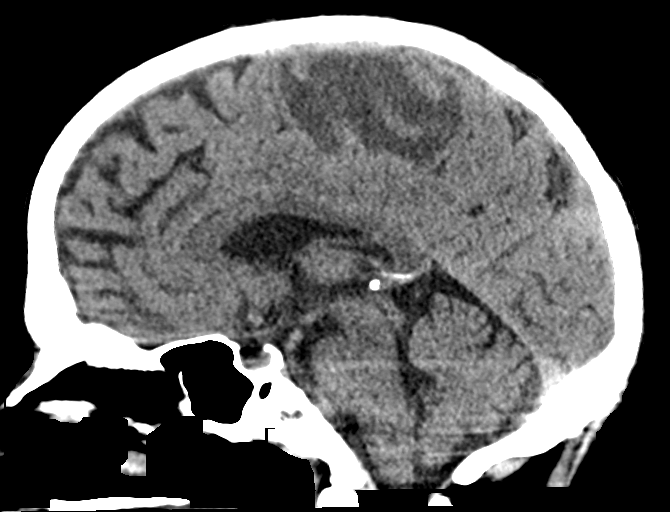
[im 35/53  brain]
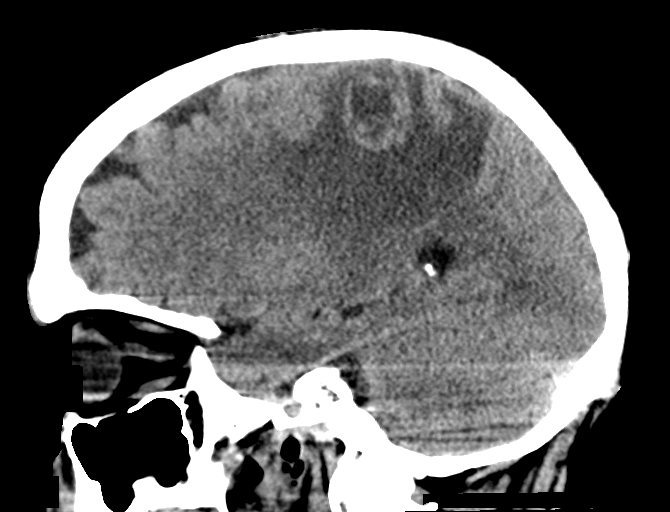

[16 of 47 positions shown; findings below may reference images not displayed]

FINDINGS: Brain: Known presumed brain metastases in the anterior left frontal
and right posterior frontal lobes with cavitary morphology and
extensive vasogenic edema. Metastatic disease is underestimated
compared to MRI. No hemorrhage, infarct, or hydrocephalus. No new
finding.

Vascular: No hyperdense vessel or unexpected calcification.

Skull: No acute or aggressive finding

Sinuses/Orbits: Bilateral cataract resection
IMPRESSION: Known presumed brain metastases seen in the left anterior frontal
and right posterior frontal lobes with extensive vasogenic edema. No
change since brain MRI 3 days ago.

## 2021-07-06 MED ORDER — ACETAMINOPHEN 650 MG RE SUPP: 650.0000 mg | Freq: Four times a day (QID) | RECTAL | Status: DC | PRN

## 2021-07-06 MED ORDER — LISINOPRIL 10 MG PO TABS
10.0000 mg | ORAL_TABLET | Freq: Every day | ORAL | Status: DC
  Administered 2021-07-06 – 2021-07-08 (×2): 10 mg via ORAL
  Filled 2021-07-06 (×3): qty 1

## 2021-07-06 MED ORDER — INSULIN ASPART 100 UNIT/ML IJ SOLN
0.0000 [IU] | Freq: Three times a day (TID) | INTRAMUSCULAR | Status: DC
  Administered 2021-07-07 – 2021-07-08 (×3): 3 [IU] via SUBCUTANEOUS
  Administered 2021-07-08: 2 [IU] via SUBCUTANEOUS
  Administered 2021-07-08: 3 [IU] via SUBCUTANEOUS
  Administered 2021-07-09: 2 [IU] via SUBCUTANEOUS
  Administered 2021-07-09: 3 [IU] via SUBCUTANEOUS
  Administered 2021-07-09 – 2021-07-10 (×3): 2 [IU] via SUBCUTANEOUS
  Administered 2021-07-10 – 2021-07-11 (×4): 3 [IU] via SUBCUTANEOUS
  Administered 2021-07-12: 8 [IU] via SUBCUTANEOUS
  Administered 2021-07-12 – 2021-07-13 (×2): 2 [IU] via SUBCUTANEOUS
  Administered 2021-07-13 (×2): 3 [IU] via SUBCUTANEOUS
  Administered 2021-07-14: 2 [IU] via SUBCUTANEOUS
  Administered 2021-07-14: 5 [IU] via SUBCUTANEOUS
  Administered 2021-07-14: 3 [IU] via SUBCUTANEOUS
  Administered 2021-07-15: 11 [IU] via SUBCUTANEOUS
  Administered 2021-07-15: 3 [IU] via SUBCUTANEOUS
  Administered 2021-07-16: 8 [IU] via SUBCUTANEOUS
  Administered 2021-07-16 (×2): 2 [IU] via SUBCUTANEOUS
  Administered 2021-07-17: 3 [IU] via SUBCUTANEOUS
  Administered 2021-07-17: 2 [IU] via SUBCUTANEOUS
  Administered 2021-07-17: 3 [IU] via SUBCUTANEOUS
  Administered 2021-07-18 (×2): 2 [IU] via SUBCUTANEOUS
  Administered 2021-07-18: 3 [IU] via SUBCUTANEOUS
  Administered 2021-07-19: 5 [IU] via SUBCUTANEOUS
  Filled 2021-07-06: qty 0.15

## 2021-07-06 MED ORDER — LORAZEPAM 1 MG PO TABS
1.0000 mg | ORAL_TABLET | Freq: Once | ORAL | Status: AC
  Administered 2021-07-06: 13:00:00 1 mg via ORAL
  Filled 2021-07-06: qty 1

## 2021-07-06 MED ORDER — UMECLIDINIUM BROMIDE 62.5 MCG/ACT IN AEPB
1.0000 | INHALATION_SPRAY | Freq: Every day | RESPIRATORY_TRACT | Status: DC
  Administered 2021-07-07 – 2021-07-19 (×13): 1 via RESPIRATORY_TRACT
  Filled 2021-07-06 (×2): qty 7

## 2021-07-06 MED ORDER — FENTANYL CITRATE PF 50 MCG/ML IJ SOSY: 25.0000 ug | PREFILLED_SYRINGE | INTRAMUSCULAR | Status: DC | PRN

## 2021-07-06 MED ORDER — ALBUTEROL SULFATE HFA 108 (90 BASE) MCG/ACT IN AERS: 2.0000 | INHALATION_SPRAY | Freq: Four times a day (QID) | RESPIRATORY_TRACT | Status: DC | PRN

## 2021-07-06 MED ORDER — ACETAMINOPHEN 325 MG PO TABS
650.0000 mg | ORAL_TABLET | Freq: Four times a day (QID) | ORAL | Status: DC | PRN
  Administered 2021-07-15 – 2021-07-19 (×2): 650 mg via ORAL
  Filled 2021-07-06 (×2): qty 2

## 2021-07-06 MED ORDER — DEXAMETHASONE 4 MG PO TABS
4.0000 mg | ORAL_TABLET | Freq: Three times a day (TID) | ORAL | Status: DC
  Administered 2021-07-06 – 2021-07-19 (×40): 4 mg via ORAL
  Filled 2021-07-06 (×40): qty 1

## 2021-07-06 MED ORDER — OXYCODONE HCL 5 MG PO TABS
5.0000 mg | ORAL_TABLET | Freq: Once | ORAL | Status: AC
  Administered 2021-07-06: 05:00:00 5 mg via ORAL
  Filled 2021-07-06: qty 1

## 2021-07-06 MED ORDER — ONDANSETRON HCL 4 MG/2ML IJ SOLN: 4.0000 mg | Freq: Four times a day (QID) | INTRAMUSCULAR | Status: DC | PRN

## 2021-07-06 MED ORDER — OXYCODONE HCL 5 MG PO TABS
5.0000 mg | ORAL_TABLET | ORAL | Status: DC | PRN
  Administered 2021-07-07 – 2021-07-18 (×17): 5 mg via ORAL
  Filled 2021-07-06 (×18): qty 1

## 2021-07-06 MED ORDER — TAMSULOSIN HCL 0.4 MG PO CAPS
0.4000 mg | ORAL_CAPSULE | Freq: Every day | ORAL | Status: DC
  Administered 2021-07-07 – 2021-07-19 (×13): 0.4 mg via ORAL
  Filled 2021-07-06 (×14): qty 1

## 2021-07-06 MED ORDER — ALBUTEROL SULFATE (2.5 MG/3ML) 0.083% IN NEBU
2.5000 mg | INHALATION_SOLUTION | Freq: Four times a day (QID) | RESPIRATORY_TRACT | Status: DC | PRN
  Administered 2021-07-06 – 2021-07-11 (×7): 2.5 mg via RESPIRATORY_TRACT
  Filled 2021-07-06 (×7): qty 3

## 2021-07-06 MED ORDER — FLUTICASONE FUROATE-VILANTEROL 100-25 MCG/ACT IN AEPB
1.0000 | INHALATION_SPRAY | Freq: Every day | RESPIRATORY_TRACT | Status: DC
  Administered 2021-07-07 – 2021-07-19 (×13): 1 via RESPIRATORY_TRACT
  Filled 2021-07-06: qty 28

## 2021-07-06 MED ORDER — ONDANSETRON HCL 4 MG PO TABS: 4.0000 mg | ORAL_TABLET | Freq: Four times a day (QID) | ORAL | Status: DC | PRN

## 2021-07-06 NOTE — ED Triage Notes (Signed)
Arrives via [facility] after sliding out of chair. No one at home to care for  him. Recently seen for left extremity weakness and admitted. Hx lung cancer with probable mets to the brain.

## 2021-07-06 NOTE — Progress Notes (Addendum)
.Transition of Care [facility]) - Emergency Department Mini Assessment   Patient Details  Name: Stephen Diaz MRN: [id number] Date of Birth: 12-20-1955  [facility]) CM/SW Contact:    Illene Regulus, LCSW Phone Number: 07/06/2021, 9:16 AM   Clinical Narrative:  TOC CSW spoke with pt at bedside. Pt was discharged on 07/05/21 from recent admission. Pt stated he lives alone. Pt stated he fell yesterday and had no one home to help him up. Pt stated he is eager to start his radiation. CSW reminded pt about his oncology appointment next month. CSW explained to pt about the challenges he will encounter with getting a nursing facility to accept him with his upcoming radiation appointments. Pt stated he is eager to start his radiation. CSW informed pt about Private Duty Care, which is an out of pocket cost. Pt agreed to discharge home with home health and wait for his upcoming oncology appointments next month. Pt stated he does not have a preferred [facility] agency, CSW will reach out to [facility] for [facility]. Pt has requested a wheel chair as well.   CSW sent referral to [facility] for wheel chair. CSW contact Tommi Rumps with [facility] to confirm [facility] PT, OT and aide. Pt stated he will call his friend to pick him up upon d/c.    Adden 2:00pm CSW spoke with pt in regards to pt's PT eval recommendations, pt stated he would like to go to a facility instead of going home. Pt stated he would like to go to CIR if possible. CSW explained he may not qualify for this but will have his chart reviewed for placement. Pt stated he would like to d/c to a SNF if not CIR. CSW will work pt up for placement.  Arlie Solomons.Ladesha Pacini, MSW, [place]   Transitions of Care Clinical Social Worker I Direct Dial: [phone]   Fax: [phone] [email or web address]  ED Mini Assessment: What brought you to the Emergency Department? : fall  Barriers to Discharge: No Barriers  Identified     Means of departure: Car  Interventions which prevented an admission or readmission: [place] or Services    Patient Contact and Communications        ,            CMS Medicare.gov Compare Post Acute Care list provided to:: Patient Choice offered to / list presented to : Patient  Admission diagnosis:  fall Patient Active Problem List   Diagnosis Date Noted   Protein-calorie malnutrition, severe 07/04/2021   Metastasis to brain ([place]) 07/03/2021   Tobacco abuse 07/03/2021   Vasogenic edema ([place]) 07/03/2021   Neoplasm causing mass effect on adjacent structures 07/03/2021   BPH (benign prostatic hyperplasia) 07/03/2021   Leukocytosis 07/03/2021   Left-sided weakness 07/03/2021   Squamous cell carcinoma of lung, stage I, right ([place]) 06/21/2021   S/P Xi Robotic Assisted Video Thoracoscopy with Right Lower Lobectomy 05/20/2021   Nodule of lower lobe of right lung 05/12/2021   Type 2 diabetes mellitus ([place]) 05/12/2021   Colitis 12/30/2020   Encounter for colonoscopy due to history of adenomatous colonic polyps 10/26/2020   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/08/2020   Dyslipidemia 01/29/2018   Chronic obstructive pulmonary disease ([place]) 10/17/2016   Essential hypertension 10/17/2016   PCP:  [name], PA-C Pharmacy:   [id number] - [place], [place] [address] Phone: [phone] Fax: [phone]

## 2021-07-06 NOTE — ED Provider Notes (Signed)
Care of patient assumed Dr. Sedonia Small at 7 AM.  This patient with history of lung cancer and recently diagnosed brain tumor, presented following a fall at home.  This was shortly after he was discharged from the hospital yesterday.  He has significant left hemibody weakness and will benefit from nursing facility care.  Follow-up on CT head for evaluation of any new findings.  PT eval and social work consult have been placed. Physical Exam  BP 116/88    Pulse 80    Temp (!) 97.5 F (36.4 C) (Oral)    Resp 18    Ht 6\' 1"  (1.854 m)    Wt 79.4 kg    SpO2 98%    BMI 23.09 kg/m   Physical Exam Constitutional:      General: He is not in acute distress.    Appearance: He is not ill-appearing, toxic-appearing or diaphoretic.  HENT:     Head: Atraumatic.     Right Ear: External ear normal.     Left Ear: External ear normal.  Eyes:     Extraocular Movements: Extraocular movements intact.  Pulmonary:     Effort: Pulmonary effort is normal. No respiratory distress.  Abdominal:     General: Abdomen is flat.  Skin:    General: Skin is warm and dry.  Neurological:     Mental Status: He is alert.     Cranial Nerves: Facial asymmetry (Left-sided facial droop) present. Dysarthria: Slightly slurred speech.    Sensory: Sensory deficit (Diminished sensation in left lower extremity) present.     Motor: Weakness (4/5 strength in left lower extremity, 0/5 strength in flaccid left upper extremity) present.    Procedures  Procedures  ED Course / MDM    Medical Decision Making Amount and/or Complexity of Data Reviewed Labs: ordered. Radiology: ordered.  Risk Prescription drug management. Decision regarding hospitalization.   CT scan of head showed no changes from imaging studies from 3 days ago.  On assessment, patient appears to be resting comfortably.  He does state that he feels as if his speech is slurring.  He reports that this is a new symptom.  On further neurologic assessment, patient has 0/5  strength in his left upper extremity.  He denies any numbness to this left arm but does state that he feels numb in his left leg.  He has diminished LLE sensation on exam and slightly diminished motor strength with his left lower extremity.  He has a slight facial asymmetry which he is not aware that he had before.  I reviewed the EMR to identify physical exam findings over the past 3 days.  3 days ago, patient reportedly had 2-3/5 LUE strength.  Given this rapid progression of symptoms, MRI of brain was ordered.  I consulted neurology to discuss this progression of symptoms.  Neurology reviewed previous imaging and stated that progression over the past 3 days would be likely with the amount of swelling and edema that the patient has in his brain.  They did agree with MRI study to identify other etiologies of his progression of symptoms.  They also stated that patient should have an EEG done if he does get admitted to the hospital.  Following MRI study, which showed redemonstration of similar findings, when compared to 3 days ago, I reached out to oncology.  I spoke with Dr. Julien Nordmann, who has seen this patient in clinic before for his lung cancer, about the patient's recent diagnoses and progression of symptoms.  Dr. Julien Nordmann  recommended that patient be admitted to the hospital for palliative radiation.  He agreed with continuing his dosing of 4 mg Decadron 3 times daily and did not have any further recommendations for medical management.  I spoke with the hospitalist who accepted this patient for admission.       Godfrey Pick, MD 07/06/21 [phone]

## 2021-07-06 NOTE — Evaluation (Signed)
Physical Therapy Evaluation Patient Details Name: Stephen Diaz MRN: [id number] DOB: [birth year] Today's Date: 07/06/2021  History of Present Illness   Pt is a 66 y.o. male returning to [facility] on 1/25 after discharge on 1/24 for recent admission for progressively worsening Lt sided weakness. CT scan of brain significant for 3 masses (Rt and Lt) up to 3 cm in diameter with vasogenic edema present mass effect on the lateral ventricles, but no midline shift. CT and MRI in ED negative for acute intracranial findings such as CVA. 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   Clinical Impression  Stephen Diaz is 66 y.o. male admitted with above HPI and diagnosis. Patient is currently limited by functional impairments below (see PT problem list). Patient lives alone and PTA on 07/02/21 he was independent with no device to mobilize at baseline however had begun to experience frequent falls at home. Pt returned the day after discharge and has worsening Lt sided weakness with increased weakness noted today in Lt LE compared to on last PT eval on 1/23. Pt now has 2/5 or less for Lt LE strength spasticity noted in quads, hamstrings, and plantarflexors, and clonus. He required guarding/assist for safety with bed mobility and min-mod assist for stand from EOB with hemiwalker for Rt UE support. Patient will benefit from continued skilled PT interventions to address impairments and progress independence with mobility, recommending intense rehab at CIR level given significant change in functional mobility. If pt does not qualify he will require SNF rehab. Acute PT will follow and progress as able.        Recommendations for follow up therapy are one component of a multi-disciplinary discharge planning process, led by the attending physician.  Recommendations may be updated based on patient  status, additional functional criteria and insurance authorization.  PT Recommendation  Recommendations for Other Services OT consult;Rehab consult  Follow Up Recommendations Acute inpatient rehab (3hours/day)  Assistance recommended at discharge Frequent or constant Supervision/Assistance  Patient can return home with the following Two people to help with walking and/or transfers;Assistance with cooking/housework;Assist for transportation;Help with stairs or ramp for entrance;Two people to help with bathing/dressing/bathroom  Functional Status Assessment Patient has had a recent decline in their functional status and/or demonstrates limited ability to make significant improvements in function in a reasonable and predictable amount of time  PT equipment  (TBA (hemiwalker))    Mobility  Bed Mobility Overal bed mobility: Needs Assistance Bed Mobility: Supine to Sit, Sit to Supine     Supine to sit: Min guard, HOB elevated Sit to supine: Min assist   General bed mobility comments: guard for safety as pt unable to assist with Lt UE or LE to move to EOB. Assist needed to raise Lt LE back onto bed and pt able to scoot to center of bed with single leg bridge on Rt LE once supine.    Transfers Overall transfer level: Needs assistance Equipment used: 1 person hand held assist, Hemi-walker Transfers: Sit to/from Stand Sit to Stand: Min assist, Mod assist           General transfer comment: min assist to initiate power up from edge of stretcher and Mod assist with HHA to maintain standing balance as pt's Lt LE moved into extensor synergy. pt unable to take small side steps at EOB due to Lt LE weakness and impaired coordiantion with Lt LE  sliding anteriorly and scissoring with attempt to step it lateral. pt return to sit on stretcher.    Ambulation/Gait                  Stairs            Wheelchair Mobility    Modified Rankin (Stroke Patients Only)       Balance  Overall balance assessment: Needs assistance Sitting-balance support: Feet unsupported Sitting balance-Leahy Scale: Good     Standing balance support: During functional activity Standing balance-Leahy Scale: Fair                               Pertinent Vitals/Pain Pain Assessment Pain Assessment: No/denies pain     07/06/21 1300  Home Living  Family/patient expects to be discharged to: Private residence  Living Arrangements Alone  Available Help at Discharge Friend(s);Available PRN/intermittently;Neighbor  Type of [place] to enter  [employer] of Steps 1  Entrance Stairs-Rails None  Home Layout One level  Bathroom Shower/Tub Tub/shower unit  Corporate treasurer Yes  Home Equipment None  Prior Function  Prior Level of Function  Independent/Modified Independent  Mobility Comments Prior to last admission pt reports he was independent with no device to mobilize.  ADLs Comments prior to last admission pt was Mod Independent for ADL's  but having multiple falls     07/06/21 1300  Upper Extremity Assessment  Upper Extremity Assessment Defer to OT evaluation  Lower Extremity Assessment  Lower Extremity Assessment LLE deficits/detail  LLE Deficits / Details Lt LE spasticity with increased tone based on velocity of stretch to hamstring, quad, and plantart flexors. clonus present. pt moves into extensor tone in weightbearing. weakness increased compared to last admission with now 3-/5 or less for LE strength.  LLE Sensation decreased proprioception  LLE Coordination decreased gross motor    Communication    No difficulties  Cognition Arousal/Alertness: Awake/alert Behavior During Therapy: WFL for tasks assessed/performed Overall Cognitive Status: Within Functional Limits for tasks assessed                                 General Comments: alert and oriented x4, aware of deficits         General Comments      Exercises      07/06/21 1300  PT - End of Session  Equipment Utilized During Treatment Gait belt  Activity Tolerance Patient tolerated treatment well  Patient left with nursing/sitter in room;in bed  Nurse Communication Mobility status  PT Assessment  PT Recommendation/Assessment Patient needs continued PT services  PT Visit Diagnosis Muscle weakness (generalized) (M62.81);Difficulty in walking, not elsewhere classified (R26.2);Unsteadiness on feet (R26.81)  PT Problem List Decreased strength;Decreased activity tolerance;Decreased balance;Decreased mobility;Decreased knowledge of use of DME;Decreased safety awareness;Decreased knowledge of precautions  PT Plan  PT Frequency (ACUTE ONLY) Min 3X/week  PT Treatment/Interventions (ACUTE ONLY) DME instruction;Stair training;Gait training;Functional mobility training;Therapeutic activities;Therapeutic exercise;Balance training;Neuromuscular re-education;Patient/family education  AM-PAC PT "6 Clicks" Mobility Outcome Measure (Version 2)  Help needed turning from your back to your side while in a flat bed without using bedrails? 3  Help needed moving from lying on your back to sitting on the side of a flat bed without using bedrails? 3  Help needed moving to and from a bed to a chair (including a wheelchair)?  2  Help needed standing up from a chair using your arms (e.g., wheelchair or bedside chair)? 2  Help needed to walk in hospital room? 1  Help needed climbing 3-5 steps with a railing?  1  6 Click Score 12  Consider Recommendation of Discharge To: CIR/SNF/LTACH  Progressive Mobility  What is the highest level of mobility based on the progressive mobility assessment? Level 3 (Stands with assist) - Balance while standing  and cannot march in place  Activity Stood at bedside  PT Recommendation  Recommendations for Other Services OT consult;Rehab consult  Follow Up Recommendations Acute inpatient rehab (3hours/day)   Assistance recommended at discharge Frequent or constant Supervision/Assistance  Patient can return home with the following Two people to help with walking and/or transfers;Assistance with cooking/housework;Assist for transportation;Help with stairs or ramp for entrance;Two people to help with bathing/dressing/bathroom  Functional Status Assessment Patient has had a recent decline in their functional status and/or demonstrates limited ability to make significant improvements in function in a reasonable and predictable amount of time  PT equipment  (TBA (hemiwalker))  Individuals Consulted  Consulted and Agree with Results and Recommendations Patient  Acute Rehab PT Goals  Patient Stated Goal figure out what is wrong with his Lt side  PT Goal Formulation With patient  Time For Goal Achievement 07/11/21  Potential to Achieve Goals Fair  PT Time Calculation  PT Start Time (ACUTE ONLY) 1240  PT Stop Time (ACUTE ONLY) 1257  PT Time Calculation (min) (ACUTE ONLY) 17 min  PT General Charges  $$ ACUTE PT VISIT 1 Visit  PT Evaluation  $PT Eval Moderate Complexity 1 Mod      Verner Mould, DPT Acute Rehabilitation Services Office [phone] Pager [phone]    Jacques Navy 07/06/2021, 5:01 PM

## 2021-07-06 NOTE — Discharge Instructions (Addendum)
Advised to follow-up with oncology as scheduled. Advised to take sodium chloride tablets 3 times daily.     [place]  [phone]

## 2021-07-06 NOTE — ED Provider Notes (Signed)
[facility] Emergency Department Provider Note MRN:  [id number]  Arrival date & time: 07/06/21     Chief Complaint   Fall   History of Present Illness   Stephen Diaz is a 66 y.o. year-old male with a history of hypertension, diabetes, COPD presenting to the ED with chief complaint of fall.  Patient explains that he was recently discharged home and after 30 minutes being home alone without any assistance he fell.  He was recently diagnosed with brain tumors and has left-sided weakness.  The left-sided weakness is not changed recently.  He does endorse worsening speech over the past few hours.  Denies any significant pain or injuries from the fall.  Here because he does not think being home alone without any assistance is a good idea.  Review of Systems  A thorough review of systems was obtained and all systems are negative except as noted in the HPI and PMH.   Patient's Health History    Past Medical History:  Diagnosis Date   Arthritis    right shoulder   Cancer ([place])    COPD (chronic obstructive pulmonary disease) ([place])    Diabetes mellitus without complication (HCC)    Type II   Hypertension    Right foot drop    from pinched nerve in back, wears brace    Past Surgical History:  Procedure Laterality Date   CATARACT EXTRACTION Left    CATARACT EXTRACTION W/PHACO Right 08/13/2020   Procedure: CATARACT EXTRACTION PHACO AND INTRAOCULAR LENS PLACEMENT ([place]);  Surgeon: Baruch Goldmann, MD;  Location: [facility];  Service: Ophthalmology;  Laterality: Right;  CDE: 8.24   COLONOSCOPY  12/2020   Dr. Adelina Mings (general surgeon): diverticulsos and left sided colitis with erythema in the sigmoid and descending colon. bx: focal mild acute colitis ?infection vs ischemia   COLONOSCOPY  10/2009   Dr. Carlyon Prows [place]: diverticulosis, two tubular adenomas removed. descending colon erythema and mild ulcerations noted with benign biopsies.   EYE SURGERY     HERNIA  REPAIR     INTERCOSTAL NERVE BLOCK Right 05/20/2021   Procedure: INTERCOSTAL NERVE BLOCK;  Surgeon: Melrose Nakayama, MD;  Location: [place];  Service: Thoracic;  Laterality: Right;   LUNG REMOVAL, PARTIAL Right    LYMPH NODE DISSECTION Right 05/20/2021   Procedure: LYMPH NODE DISSECTION;  Surgeon: Melrose Nakayama, MD;  Location: [place];  Service: Thoracic;  Laterality: Right;   REVERSE SHOULDER ARTHROPLASTY Right 09/08/2020   Procedure: REVERSE TOTAL SHOULDER ARTHROPLASTY;  Surgeon: Hiram Gash, MD;  Location: [place];  Service: Orthopedics;  Laterality: Right;   screws and steel plate in neck  (N8-2)     10-12 years ago  x2    Family History  Problem Relation Age of Onset   Crohn's disease Cousin    Colon cancer Neg Hx     Social History   Socioeconomic History   Marital status: Widowed    Spouse name: Not on file   Number of children: Not on file   Years of education: Not on file   Highest education level: Not on file  Occupational History   Not on file  Tobacco Use   Smoking status: Former    Packs/day: 1.00    Years: 49.00    Pack years: 49.00    Types: Cigarettes    Start date: 63    Quit date: 05/2021    Years since quitting: 0.1   Smokeless tobacco: Never  Vaping Use   Vaping Use: Never used  Substance and Sexual Activity   Alcohol use: Not Currently   Drug use: Not Currently   Sexual activity: Not Currently  Other Topics Concern   Not on file  Social History Narrative   Not on file   Social Determinants of Health   Financial Resource Strain: Not on file  Food Insecurity: Not on file  Transportation Needs: Not on file  Physical Activity: Not on file  Stress: Not on file  Social Connections: Not on file  Intimate Partner Violence: Not on file     Physical Exam   Vitals:   07/06/21 0516 07/06/21 0600  BP: (!) 154/91 124/90  Pulse: 96 88  Resp: 16 17  Temp:    SpO2: 99% 99%    CONSTITUTIONAL: Chronically  ill-appearing, NAD NEURO/PSYCH:  Alert and oriented x 3, decreased strength to left arm, left leg, mild slurred speech EYES:  eyes equal and reactive ENT/NECK:  no LAD, no JVD CARDIO: Regular rate, well-perfused, normal S1 and S2 PULM:  CTAB no wheezing or rhonchi GI/GU:  non-distended, non-tender MSK/SPINE:  No gross deformities, no edema SKIN:  no rash, atraumatic   *Additional and/or pertinent findings included in MDM below  Diagnostic and Interventional Summary    EKG Interpretation  Date/Time:    Ventricular Rate:    PR Interval:    QRS Duration:   QT Interval:    QTC Calculation:   R Axis:     Text Interpretation:         Labs Reviewed  CBC - Abnormal; Notable for the following components:      Result Value   WBC 16.3 (*)    All other components within normal limits  COMPREHENSIVE METABOLIC PANEL - Abnormal; Notable for the following components:   Glucose, Bld 126 (*)    BUN 25 (*)    All other components within normal limits    CT HEAD WO CONTRAST (5MM)    (Results Pending)    Medications  oxyCODONE (Oxy IR/ROXICODONE) immediate release tablet 5 mg (5 mg Oral Given 07/06/21 0525)     Procedures  /  Critical Care Procedures  ED Course and Medical Decision Making  Initial Impression and Ddx Suspect left-sided deficits are due to known brain tumors.  May be having some progressive edema given the worsening speech that patient is endorsing.  Also considering intracranial bleeding, stroke.  We will repeat CT head, obtain basic labs.  Will need admission versus placement.  Past medical/surgical history that increases complexity of ED encounter: History of lung cancer  Interpretation of Diagnostics Labs are overall reassuring, leukocytosis of unclear significance.  Awaiting CT head  Patient Reassessment and Ultimate Disposition/Management Signed out to oncoming provider, plan is for admission versus TOC evaluation.  Patient management required discussion  with the following services or consulting groups:  Case Management/Social Work  Complexity of Problems Addressed Acute complicated illness or Injury  Additional Data Reviewed and Analyzed Further history obtained from: Recent discharge summary  Factors Impacting ED Encounter Risk Consideration of hospitalization  Barth Kirks. [place] Small, [place] [email or web address]  Final Clinical Impressions(s) / ED Diagnoses     ICD-10-CM   1. Brain tumor ([place])  D49.6     2. Neurological deficit present  R29.818       ED Discharge Orders     None        Discharge Instructions Discussed with and Provided to  Patient:   Discharge Instructions   None      Maudie Flakes, MD 07/06/21 [phone]

## 2021-07-06 NOTE — H&P (Signed)
History and Physical    Stephen Diaz IRC:[id number] DOB: 01/15/1956 DOA: 07/06/2021  PCP: [name], PA-C   Patient coming from: Home.  I have personally briefly reviewed patient's old medical records in [place]  Chief Complaint: Worsening right-sided weakness.  HPI: Stephen Diaz is a 66 y.o. male with medical history significant of osteoarthritis, cancer, COPD, type II DM, hypertension, right foot drop who is coming to the emergency department due to worsened left sided weakness in the setting of brain metastasis. He denied headache, but stated at times his vision has been blurred and his speech has been slurred. No fever, chills or night sweats. No dyspnea, wheezing or hemoptysis. No CP, palpitations, diaphoresis, PND, orthopnea or pitting edema of the lower extremities. Denied abdominal pain, N/V, diarrhea, constipation, melena or hematochezia. No dysuria, frequency or hematuria. No polyuria, polydipsia or polyphagia.   ED Course: Initial vital signs were temperature 97.5 F, pulse 96, respiration 18, BP 135/89 mmHg and O2 sat 100% on room air.  The patient received oxycodone 5 mg p.o. x1 dose and lorazepam 1 mg p.o. x1 dose before MRI scanning.  Lab work: CBC showed a white count of 16.3, hemoglobin 13.5 g/dL platelets 277.  CMP with a glucose 126 BUN of 25 mg/dL.  All other CMP values were normal.  Imaging: Portable 1 view chest radiograph with no acute abnormality.  CT head without contrast no evidence of acute intracranial hemorrhage or infarct. Please see images and full daily report for further details.  MRI brain without contrast show unchanged size and noncontrast appearance of the metastatic lesions in the bilateral frontal lobes.  There is a partially imaged cystic lesion in the left neck inferior lateral to the parotid gland that was not hypermetabolic on the previous PET scan.  Please see images and full radiology report for further details.  Review of  Systems: As per HPI otherwise all other systems reviewed and are negative.  Past Medical History:  Diagnosis Date   Arthritis    right shoulder   Cancer (HCC)    COPD (chronic obstructive pulmonary disease) (HCC)    Diabetes mellitus without complication (HCC)    Type II   Hypertension    Right foot drop    from pinched nerve in back, wears brace   Past Surgical History:  Procedure Laterality Date   CATARACT EXTRACTION Left    CATARACT EXTRACTION W/PHACO Right 08/13/2020   Procedure: CATARACT EXTRACTION PHACO AND INTRAOCULAR LENS PLACEMENT ([place]);  Surgeon: Baruch Goldmann, MD;  Location: [facility];  Service: Ophthalmology;  Laterality: Right;  CDE: 8.24   COLONOSCOPY  12/2020   Dr. Adelina Mings (general surgeon): diverticulsos and left sided colitis with erythema in the sigmoid and descending colon. bx: focal mild acute colitis ?infection vs ischemia   COLONOSCOPY  10/2009   Dr. Carlyon Prows [place]: diverticulosis, two tubular adenomas removed. descending colon erythema and mild ulcerations noted with benign biopsies.   EYE SURGERY     HERNIA REPAIR     INTERCOSTAL NERVE BLOCK Right 05/20/2021   Procedure: INTERCOSTAL NERVE BLOCK;  Surgeon: Melrose Nakayama, MD;  Location: [place];  Service: Thoracic;  Laterality: Right;   LUNG REMOVAL, PARTIAL Right    LYMPH NODE DISSECTION Right 05/20/2021   Procedure: LYMPH NODE DISSECTION;  Surgeon: Melrose Nakayama, MD;  Location: [facility] [place];  Service: Thoracic;  Laterality: Right;   REVERSE SHOULDER ARTHROPLASTY Right 09/08/2020   Procedure: REVERSE TOTAL SHOULDER ARTHROPLASTY;  Surgeon: Ophelia Charter  T, MD;  Location: [facility];  Service: Orthopedics;  Laterality: Right;   screws and steel plate in neck  (V7-8)     10-12 years ago  x2   Social History  reports that he quit smoking about 7 weeks ago. His smoking use included cigarettes. He started smoking about 50 years ago. He has a 49.00 pack-year smoking history. He has never  used smokeless tobacco. He reports that he does not currently use alcohol. He reports that he does not currently use drugs.  No Known Allergies  Family History  Problem Relation Age of Onset   Crohn's disease Cousin    Colon cancer Neg Hx    Prior to Admission medications   Medication Sig Start Date End Date Taking? Authorizing Provider  albuterol (VENTOLIN HFA) 108 (90 Base) MCG/ACT inhaler Inhale 2 puffs into the lungs every 6 (six) hours as needed for wheezing or shortness of breath.    [provider]  aspirin 81 MG EC tablet Take 81 mg by mouth daily.    [provider]  dexamethasone (DECADRON) 4 MG tablet Take 1 tablet (4 mg total) by mouth 3 (three) times daily. 07/05/21 10/03/21  [place] Donnamarie Poag, DO  ibuprofen (ADVIL) 200 MG tablet Take 2 tablets (400 mg total) by mouth every 6 (six) hours as needed for mild pain. 06/02/21   Barrett, Erin R, PA-C  insulin lispro (HUMALOG) 200 UNIT/ML KwikPen Inject 2-10 Units into the skin 3 (three) times daily before meals. Take 2 units for glucose 150-200, 4 units for glucose 201-250, 6 units for glucose 251-300, 8 units for glucose 201-350 and 10 units for glucose 351-400. 07/05/21   [place] Donnamarie Poag, DO  Insulin Pen Needle (PEN NEEDLES 3/16") 31G X 5 MM MISC Use as directed with insulin pen 07/05/21   [place] Eric J, DO  lisinopril (ZESTRIL) 10 MG tablet Take 10 mg by mouth daily.    [provider]  LORazepam (ATIVAN) 0.5 MG tablet 1 tab po 30 minutes prior to radiation or MRI scans 07/05/21   [name], PA-C  metFORMIN (GLUCOPHAGE) 500 MG tablet Take 1,000 mg by mouth daily with breakfast. 05/10/20   [provider]  Multiple Vitamins-Minerals (CENTRUM SILVER 50+MEN) TABS Take 1 tablet by mouth daily.    [provider]  oxyCODONE (OXY IR/ROXICODONE) 5 MG immediate release tablet Take 1 tablet (5 mg total) by mouth every 4 (four) hours as needed for moderate pain. 06/02/21   Barrett, Lodema Hong,  PA-C  tamsulosin (FLOMAX) 0.4 MG CAPS capsule Take 1 capsule (0.4 mg total) by mouth daily after breakfast. 07/06/21 10/04/21  [place] Eric J, DO  TRELEGY ELLIPTA 100-62.5-25 MCG/INH AEPB Inhale 1 puff into the lungs daily. 01/01/21   [provider]   Physical Exam: Vitals:   07/06/21 1204 07/06/21 1447 07/06/21 1519 07/06/21 1529  BP: 122/86  110/90   Pulse: 90  (!) 103 (!) 108  Resp: 16  16   Temp:  98.2 F (36.8 C)    TempSrc:  Oral    SpO2: 100%  93% 95%  Weight:      Height:       Constitutional: NAD, calm, comfortable Eyes: PERRL, lids and conjunctivae normal ENMT: Mucous membranes are moist. Posterior pharynx clear of any exudate or lesions. Neck: Normal, supple, no masses, no thyromegaly Respiratory: Clear to auscultation bilaterally, no wheezing, no crackles. Normal respiratory effort. No accessory muscle use.  Cardiovascular: Regular rate and rhythm, no  murmurs / rubs / gallops. No extremity edema. 2+ pedal pulses. No carotid bruits.  Abdomen: Soft, no tenderness, no masses palpated. No hepatosplenomegaly. Bowel sounds positive.  Musculoskeletal: no clubbing / cyanosis. Good ROM, no contractures. Normal muscle tone.  Skin: No rashes, lesions, ulcers on very limited examination. Neurologic: CN 2-12 grossly intact. Decreased sensation on left sided extremities more pronounced on LUE. 1/5 LUE and 3/5 LLE.  Psychiatric: Normal judgment and insight. Alert and oriented x 3. Normal mood.   Labs on Admission: I have personally reviewed following labs and imaging studies  CBC: Recent Labs  Lab 07/02/21 2003 07/02/21 2024 07/04/21 0352 07/06/21 0526  WBC 13.5*  --  10.2 16.3*  NEUTROABS 8.5*  --   --   --   HGB 13.2 13.9 11.5* 13.5  HCT 40.2 41.0 35.1* 42.3  MCV 84.6  --  82.8 86.5  PLT 353  --  272 638    Basic Metabolic Panel: Recent Labs  Lab 07/02/21 2003 07/02/21 2024 07/04/21 0352 07/06/21 0526  NA 136 137 133* 135  K 4.3 4.2 3.9 3.9  CL 103 102  102 100  CO2 23  --  24 24  GLUCOSE 133* 124* 156* 126*  BUN 12 12 17  25*  CREATININE 0.70 0.70 0.53* 0.65  CALCIUM 9.3  --  8.8* 9.2    GFR: Estimated Creatinine Clearance: 102 mL/min (by C-G formula based on SCr of 0.65 mg/dL).  Liver Function Tests: Recent Labs  Lab 07/02/21 2003 07/06/21 0526  AST 20 28  ALT 20 36  ALKPHOS 82 65  BILITOT 0.5 0.8  PROT 7.7 7.4  ALBUMIN 3.7 3.8    Urine analysis:    Component Value Date/Time   COLORURINE YELLOW 07/02/2021 0045   APPEARANCEUR HAZY (A) 07/02/2021 0045   LABSPEC 1.023 07/02/2021 0045   PHURINE 5.0 07/02/2021 0045   GLUCOSEU NEGATIVE 07/02/2021 0045   HGBUR NEGATIVE 07/02/2021 0045   BILIRUBINUR NEGATIVE 07/02/2021 0045   KETONESUR NEGATIVE 07/02/2021 0045   PROTEINUR NEGATIVE 07/02/2021 0045   NITRITE NEGATIVE 07/02/2021 0045   LEUKOCYTESUR TRACE (A) 07/02/2021 0045    Radiological Exams on Admission: CT HEAD WO CONTRAST (5MM)  Result Date: 07/06/2021 CLINICAL DATA:  Fall from chair. Weakness of left extremity for 2 weeks EXAM: CT HEAD WITHOUT CONTRAST TECHNIQUE: Contiguous axial images were obtained from the base of the skull through the vertex without intravenous contrast. RADIATION DOSE REDUCTION: This exam was performed according to the departmental dose-optimization program which includes automated exposure control, adjustment of the mA and/or kV according to patient size and/or use of iterative reconstruction technique. COMPARISON:  Brain MRI from 3 days ago FINDINGS: Brain: Known presumed brain metastases in the anterior left frontal and right posterior frontal lobes with cavitary morphology and extensive vasogenic edema. Metastatic disease is underestimated compared to MRI. No hemorrhage, infarct, or hydrocephalus. No new finding. Vascular: No hyperdense vessel or unexpected calcification. Skull: No acute or aggressive finding Sinuses/Orbits: Bilateral cataract resection IMPRESSION: Known presumed brain metastases  seen in the left anterior frontal and right posterior frontal lobes with extensive vasogenic edema. No change since brain MRI 3 days ago. Electronically Signed   By: Jorje Guild M.D.   On: 07/06/2021 07:42   MR BRAIN WO CONTRAST  Result Date: 07/06/2021 CLINICAL DATA:  Progressive lower extremity weakness, known brain Mets from squamous cell lung cancer EXAM: MRI HEAD WITHOUT CONTRAST TECHNIQUE: Multiplanar, multiecho pulse sequences of the brain and surrounding structures were obtained without intravenous  contrast. COMPARISON:  Same-day noncontrast head CT, MR head 07/03/2021 FINDINGS: Brain: There is no evidence of acute intracranial hemorrhage, extra-axial fluid collection, or acute infarct. Again seen are metastatic lesions in the left frontal lobe centered at the middle frontal gyrus and right precentral gyrus, unchanged in size compared to the recent study from 07/03/2021 measured on the noncontrast T1 sequence. SWI signal dropout within the right frontal lesion is unchanged, consistent with blood products. Perilesional edema in the left frontal lobe and right frontal and parietal lobes extending inferiorly to the right corona radiata is unchanged. There is partial effacement of the right lateral ventricle but no midline shift, unchanged. The additional 3 mm enhancing lesion seen in the left precentral gyrus is not seen on the current study in the absence of intravenous contrast. Vascular: Normal flow voids. Skull and upper cervical spine: Normal marrow signal. Upper cervical spine fusion hardware is partially imaged. Sinuses/Orbits: The paranasal sinuses are clear. Bilateral lens implants are in place. The globes and orbits are otherwise unremarkable. Other: There is a partially imaged T2 hyperintense lesion in the left neck just inferolateral to the parotid gland. There is associated diffusion restriction (13-47). This lesion was present on the PET-CT from 04/04/2021 and was not hypermetabolic.  IMPRESSION: 1. Unchanged size and noncontrast appearance of the metastatic lesions in the bilateral frontal lobes as above with unchanged extensive perilesional edema but no midline shift. 2. No evidence of acute intracranial hemorrhage or infarct. 3. Partially imaged cystic lesion in the left neck inferolateral to the parotid gland was present on the prior PET-CT from 04/04/2021 and was not hypermetabolic on that study. This may reflect a sebaceous cyst; however, a necrotic lymph node can not be entirely excluded. Recommend attention on follow-up PET-CTs. Electronically Signed   By: Valetta Mole M.D.   On: 07/06/2021 13:55    EKG: Independently reviewed.   Assessment/Plan Principal Problem:   Acute left-sided weakness Due to   Metastatic squamous lung cell carcinoma Observation/telemetry. Frequent neurochecks. Continue dexamethasone 4 mg po TID. Monitor glucose closely. Consult oncology in AM. Will likely need palliative radiation.  Active Problems:   Metastasis to brain (HCC) Continue dexamethasone 4 mg p.o. TID    Chronic obstructive pulmonary disease ([place]) Supplemental oxygen as needed. Bronchodilators as needed.    Type 2 diabetes mellitus (HCC) Carbohydrate modified diet. CBG monitoring with RI affect.    Essential hypertension Continue lisinopril 10 mg p.o. daily. Monitor blood pressure    Dyslipidemia Currently not on medical therapy. Follow-up with primary care provider.   DVT prophylaxis: SCDs. Code Status:   Full code. Family Communication:   Disposition Plan:   Patient is from:  Home.  Anticipated DC to:  Home.  Anticipated DC date:  24 to 48 hours.  Anticipated DC barriers: Clinical status.  Consults called:   Admission status:  Observation/telemetry.  Severity of Illness: High severity in the setting of left-sided weakness secondary to brain metastasis.  Reubin Milan MD [facility]  How to contact the [facility] Attending or Consulting provider  [place] or covering provider during after hours [place], for this patient?   Check the care team in [facility] and look for a) attending/consulting [facility] provider listed and b) the [facility] team listed Log into [email or web address] and use [place]'s universal password to access. If you do not have the password, please contact the hospital operator. Locate the [facility] provider you are looking for under [facility] and page to a number that you  can be directly reached. If you still have difficulty reaching the provider, please page the [facility] (Director on Call) for the Hospitalists listed on amion for assistance.  07/06/2021, 4:57 PM   This document was prepared using [employer] and may contain some unintended transcription errors.

## 2021-07-07 ENCOUNTER — Inpatient Hospital Stay (HOSPITAL_COMMUNITY): Payer: Medicare Other

## 2021-07-07 ENCOUNTER — Inpatient Hospital Stay (HOSPITAL_COMMUNITY)
Admit: 2021-07-07 | Discharge: 2021-07-07 | Disposition: A | Discharge status: Home / Self Care | Payer: Medicare Other | Attending: Family Medicine | Admitting: Family Medicine

## 2021-07-07 DIAGNOSIS — N400 Enlarged prostate without lower urinary tract symptoms: Secondary | ICD-10-CM | POA: Diagnosis present

## 2021-07-07 DIAGNOSIS — E1141 Type 2 diabetes mellitus with diabetic mononeuropathy: Secondary | ICD-10-CM | POA: Diagnosis present

## 2021-07-07 DIAGNOSIS — E871 Hypo-osmolality and hyponatremia: Secondary | ICD-10-CM | POA: Diagnosis not present

## 2021-07-07 DIAGNOSIS — I10 Essential (primary) hypertension: Secondary | ICD-10-CM | POA: Diagnosis present

## 2021-07-07 DIAGNOSIS — R569 Unspecified convulsions: Secondary | ICD-10-CM | POA: Diagnosis not present

## 2021-07-07 DIAGNOSIS — G936 Cerebral edema: Secondary | ICD-10-CM | POA: Diagnosis present

## 2021-07-07 DIAGNOSIS — J449 Chronic obstructive pulmonary disease, unspecified: Secondary | ICD-10-CM | POA: Diagnosis present

## 2021-07-07 DIAGNOSIS — R531 Weakness: Secondary | ICD-10-CM | POA: Diagnosis not present

## 2021-07-07 DIAGNOSIS — Z7189 Other specified counseling: Secondary | ICD-10-CM | POA: Diagnosis not present

## 2021-07-07 DIAGNOSIS — M1990 Unspecified osteoarthritis, unspecified site: Secondary | ICD-10-CM | POA: Diagnosis present

## 2021-07-07 DIAGNOSIS — R4781 Slurred speech: Secondary | ICD-10-CM | POA: Diagnosis present

## 2021-07-07 DIAGNOSIS — Z7951 Long term (current) use of inhaled steroids: Secondary | ICD-10-CM | POA: Diagnosis not present

## 2021-07-07 DIAGNOSIS — C3491 Malignant neoplasm of unspecified part of right bronchus or lung: Secondary | ICD-10-CM | POA: Diagnosis present

## 2021-07-07 DIAGNOSIS — E785 Hyperlipidemia, unspecified: Secondary | ICD-10-CM | POA: Diagnosis present

## 2021-07-07 DIAGNOSIS — R29810 Facial weakness: Secondary | ICD-10-CM | POA: Diagnosis present

## 2021-07-07 DIAGNOSIS — D496 Neoplasm of unspecified behavior of brain: Secondary | ICD-10-CM | POA: Diagnosis present

## 2021-07-07 DIAGNOSIS — Z96611 Presence of right artificial shoulder joint: Secondary | ICD-10-CM | POA: Diagnosis present

## 2021-07-07 DIAGNOSIS — L89891 Pressure ulcer of other site, stage 1: Secondary | ICD-10-CM | POA: Diagnosis present

## 2021-07-07 DIAGNOSIS — G8194 Hemiplegia, unspecified affecting left nondominant side: Secondary | ICD-10-CM | POA: Diagnosis present

## 2021-07-07 DIAGNOSIS — Z66 Do not resuscitate: Secondary | ICD-10-CM | POA: Diagnosis not present

## 2021-07-07 DIAGNOSIS — L89301 Pressure ulcer of unspecified buttock, stage 1: Secondary | ICD-10-CM | POA: Diagnosis present

## 2021-07-07 DIAGNOSIS — Z794 Long term (current) use of insulin: Secondary | ICD-10-CM | POA: Diagnosis not present

## 2021-07-07 DIAGNOSIS — Z7982 Long term (current) use of aspirin: Secondary | ICD-10-CM | POA: Diagnosis not present

## 2021-07-07 DIAGNOSIS — Z515 Encounter for palliative care: Secondary | ICD-10-CM | POA: Diagnosis not present

## 2021-07-07 DIAGNOSIS — C7931 Secondary malignant neoplasm of brain: Secondary | ICD-10-CM | POA: Diagnosis present

## 2021-07-07 DIAGNOSIS — R471 Dysarthria and anarthria: Secondary | ICD-10-CM | POA: Diagnosis present

## 2021-07-07 DIAGNOSIS — M21371 Foot drop, right foot: Secondary | ICD-10-CM | POA: Diagnosis present

## 2021-07-07 DIAGNOSIS — Z87891 Personal history of nicotine dependence: Secondary | ICD-10-CM | POA: Diagnosis not present

## 2021-07-07 DIAGNOSIS — Z7984 Long term (current) use of oral hypoglycemic drugs: Secondary | ICD-10-CM | POA: Diagnosis not present

## 2021-07-07 DIAGNOSIS — Z20822 Contact with and (suspected) exposure to covid-19: Secondary | ICD-10-CM | POA: Diagnosis present

## 2021-07-07 LAB — GLUCOSE, CAPILLARY
Glucose-Capillary: 170 mg/dL — ABNORMAL HIGH (ref 70–99)
Glucose-Capillary: 104 mg/dL — ABNORMAL HIGH (ref 70–99)
Glucose-Capillary: 161 mg/dL — ABNORMAL HIGH (ref 70–99)
Glucose-Capillary: 176 mg/dL — ABNORMAL HIGH (ref 70–99)

## 2021-07-07 IMAGING — MR MR HEAD WO/W CM
10 of 14 series · 22 of 48 positions shown · IV contrast (gadavist)
Comparison: [DATE]
COMPARISON: [DATE]

Addendum:
CLINICAL DATA: Brain/CNS neoplasm, staging.

EXAM:
MRI HEAD WITHOUT AND WITH CONTRAST
TECHNIQUE: Multiplanar, multiecho pulse sequences of the brain and surrounding
structures were obtained without and with intravenous contrast.
CONTRAST:  10mL GADAVIST GADOBUTROL 1 MMOL/ML IV SOLN

[Series 2: FLAIR · sagittal · 3.0mm · 0.59mm/px · 1 of 47 slices shown (1 of 2)]
[im 1/47]
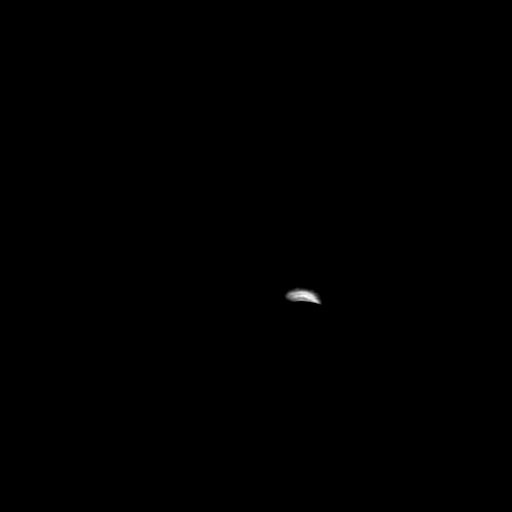

[Series 3: DWI · axial · 3.0mm · 0.94mm/px · z∈[-54,+150]mm · 3 of 142 slices shown]
[im 1/142]
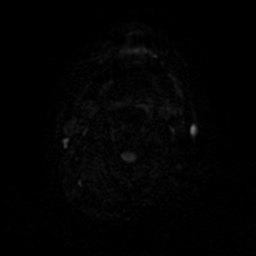
[im 71/142]
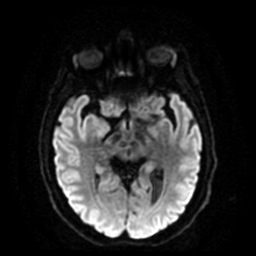
[im 142/142]
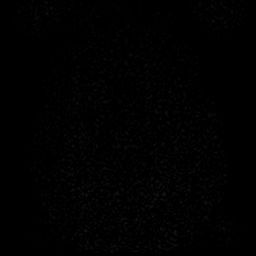

[Series 4: FLAIR · axial · 3.0mm · 0.49mm/px · z∈[-86,+124]mm · 2 of 71 slices shown (2 of 2)]
[im 1/71]
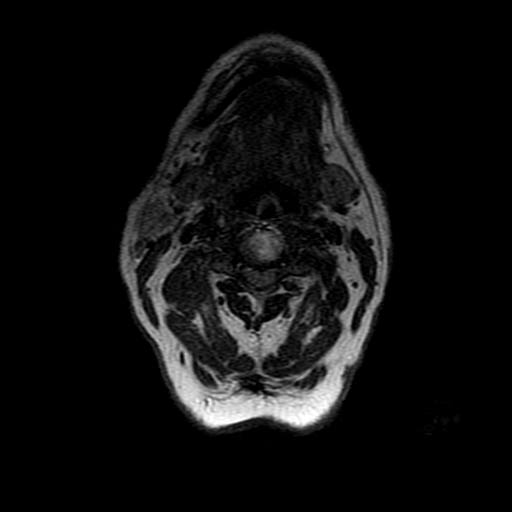
[im 71/71]
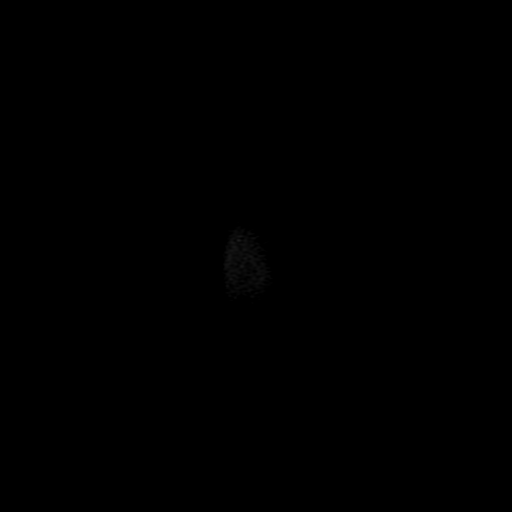

[Series 5: SWI · axial · 3.0mm · 0.47mm/px · z∈[-52,+142]mm · 3 of 132 slices shown]
[im 1/132]
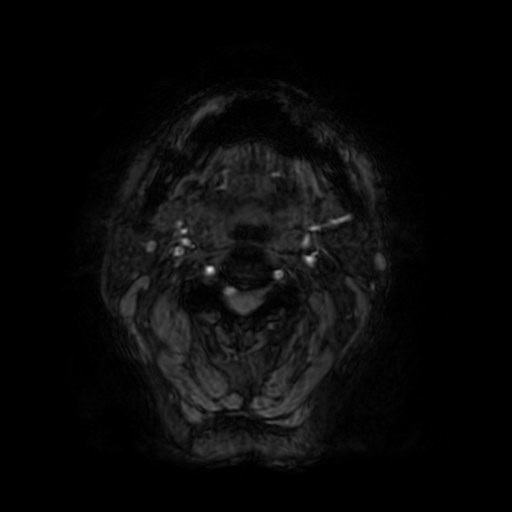
[im 66/132]
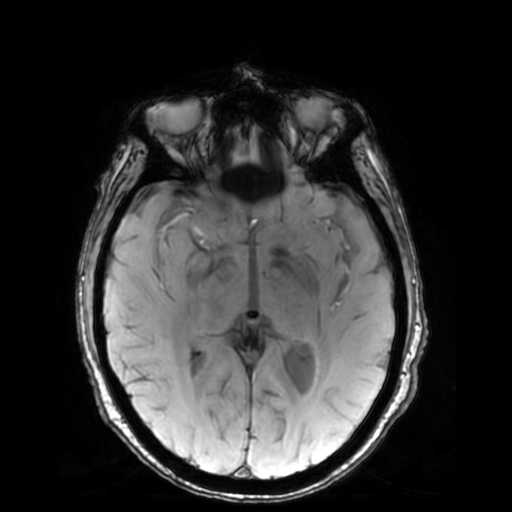
[im 132/132]
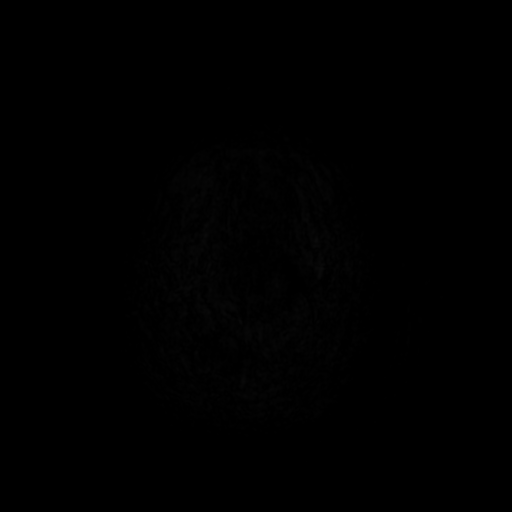

[Series 7: T2 post-contrast · coronal · 3.0mm · 0.39mm/px · 1 of 61 slices shown (1 of 2)]
[im 1/61]
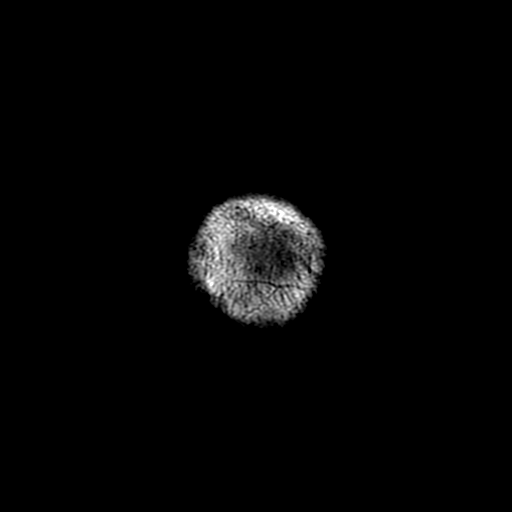

[Series 8: T2 post-contrast · axial · 5.0mm · 0.49mm/px · 1 of 38 slices shown (2 of 2)]
[im 1/38]
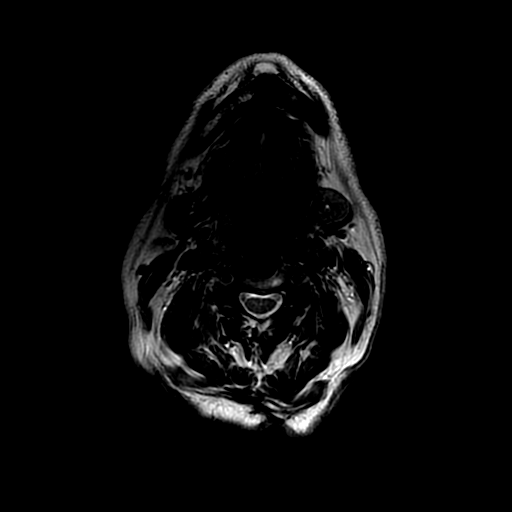

[Series 9: T1 post-contrast · coronal · 3.0mm · 0.39mm/px · 1 of 61 slices shown (1 of 2)]
[im 1/61]
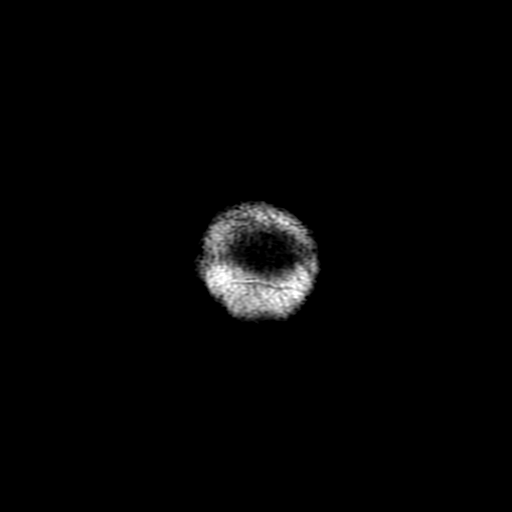

[Series 10: FLAIR post-contrast · sagittal · 3.0mm · 0.59mm/px · 1 of 47 slices shown]
[im 1/47]
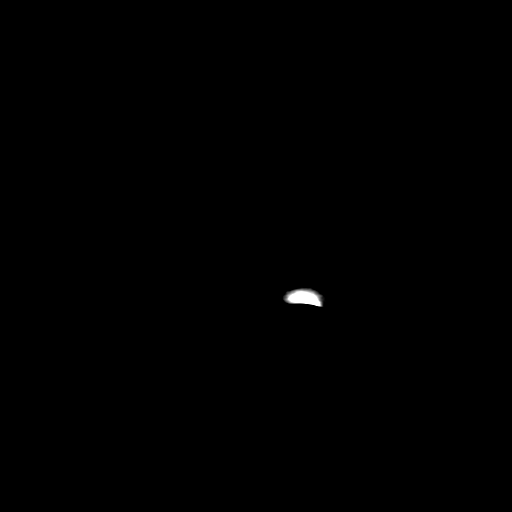

[Series 350: ADC · axial · 3.0mm · 0.94mm/px · z∈[-54,+150]mm · 2 of 71 slices shown]
[im 1/71]
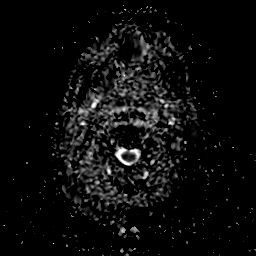
[im 71/71]
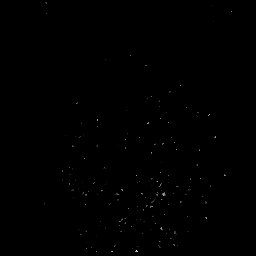

[Series 1100: T1 post-contrast · axial · 0.9mm · 0.50mm/px · z∈[-118,+137]mm · 7 of 297 slices shown (2 of 2)]
[im 1/297]
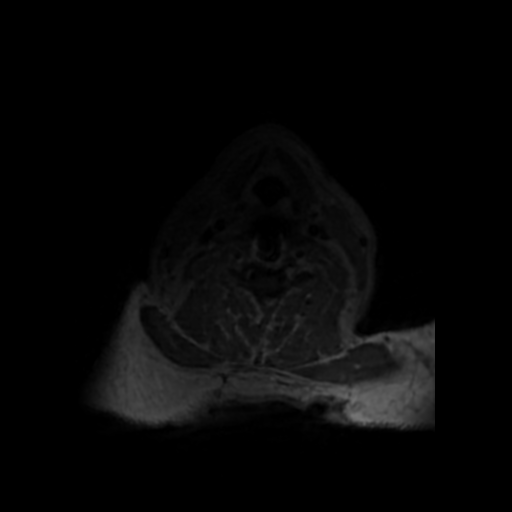
[im 50/297]
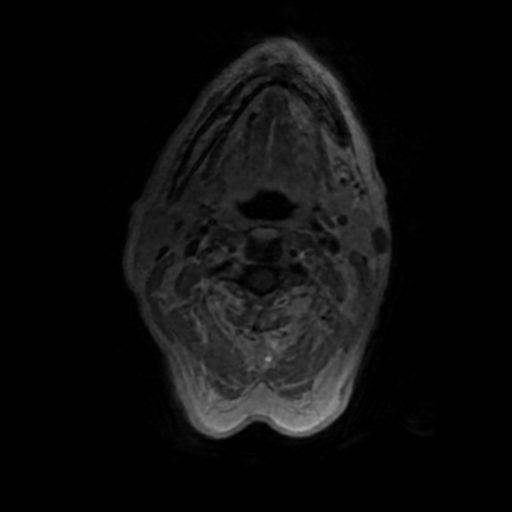
[im 99/297]
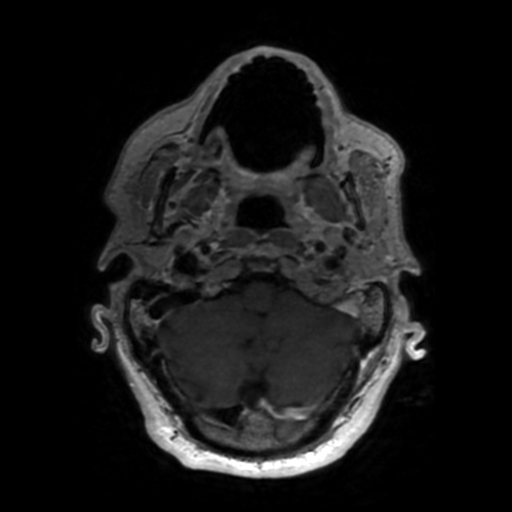
[im 149/297]
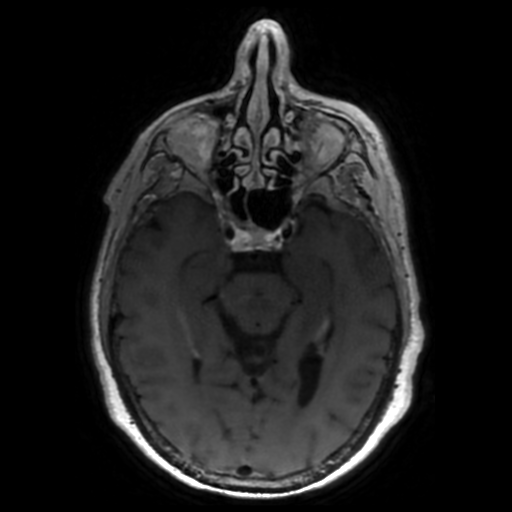
[im 198/297]
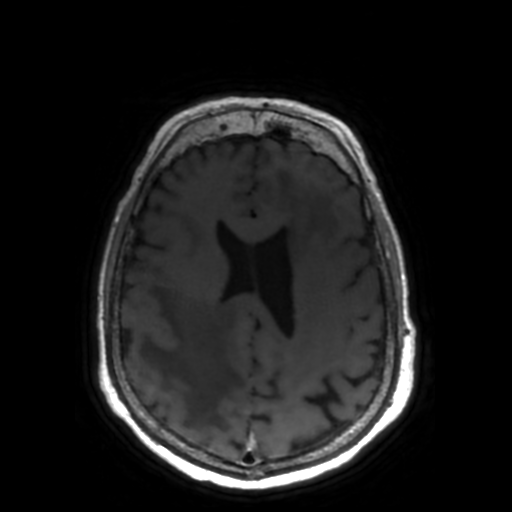
[im 247/297]
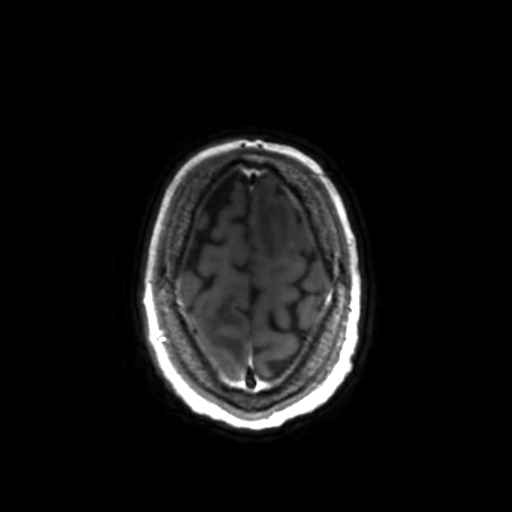
[im 297/297]
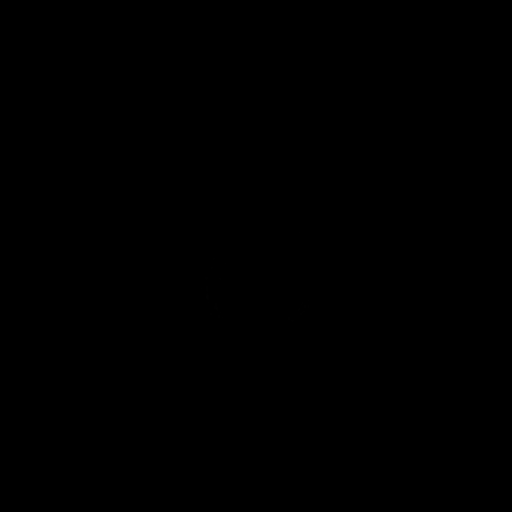

[22 of 48 positions shown; findings below may reference images not displayed]

FINDINGS: Brain: Unchanged appearance of predominantly cystic lesions of the
frontal lobes with severe surrounding edema. There is no internal
diffusion restriction. Small amount of associated chronic blood
products without acute hemorrhage. There is peripheral nodular
contrast enhancement within both lesions, more notable on the right.
There is no herniation. There are no other lesions.

Vascular: Major flow voids are preserved.

Skull and upper cervical spine: Normal calvarium and skull base.
Visualized upper cervical spine and soft tissues are normal.

Sinuses/Orbits:No paranasal sinus fluid levels or advanced mucosal
thickening. No mastoid or middle ear effusion. Normal orbits.
IMPRESSION: Peripheral enhancement and nodularity of the 2 cystic lesions of the
frontal lobes, which remain consistent with metastatic disease. No
other lesions.

ADDENDUM:
Original report by Dr. USEJDI. Addendum by Dr. USEJDI on [DATE]
following review in multidisciplinary oncology conference: A third
enhancing lesion measures 2 mm in the left frontoparietal region
(series [BQ], image 208) and is unchanged from [DATE].

*** End of Addendum ***
FINDINGS: Brain: Unchanged appearance of predominantly cystic lesions of the
frontal lobes with severe surrounding edema. There is no internal
diffusion restriction. Small amount of associated chronic blood
products without acute hemorrhage. There is peripheral nodular
contrast enhancement within both lesions, more notable on the right.
There is no herniation. There are no other lesions.

Vascular: Major flow voids are preserved.

Skull and upper cervical spine: Normal calvarium and skull base.
Visualized upper cervical spine and soft tissues are normal.

Sinuses/Orbits:No paranasal sinus fluid levels or advanced mucosal
thickening. No mastoid or middle ear effusion. Normal orbits.
IMPRESSION: Peripheral enhancement and nodularity of the 2 cystic lesions of the
frontal lobes, which remain consistent with metastatic disease. No
other lesions.

## 2021-07-07 MED ORDER — DOCUSATE SODIUM 100 MG PO CAPS
100.0000 mg | ORAL_CAPSULE | Freq: Every day | ORAL | Status: DC | PRN
  Administered 2021-07-08: 100 mg via ORAL
  Filled 2021-07-07: qty 1

## 2021-07-07 MED ORDER — LORAZEPAM 0.5 MG PO TABS
0.5000 mg | ORAL_TABLET | Freq: Every day | ORAL | Status: DC | PRN
  Administered 2021-07-07 – 2021-07-18 (×9): 0.5 mg via ORAL
  Filled 2021-07-07 (×10): qty 1

## 2021-07-07 MED ORDER — GADOBUTROL 1 MMOL/ML IV SOLN
10.0000 mL | Freq: Once | INTRAVENOUS | Status: AC | PRN
  Administered 2021-07-07: 10 mL via INTRAVENOUS

## 2021-07-07 MED ORDER — METFORMIN HCL 500 MG PO TABS
1000.0000 mg | ORAL_TABLET | Freq: Every day | ORAL | Status: DC
  Administered 2021-07-07 – 2021-07-19 (×13): 1000 mg via ORAL
  Filled 2021-07-07 (×13): qty 2

## 2021-07-07 NOTE — Evaluation (Signed)
Occupational Therapy Evaluation Patient Details Name: Stephen Diaz MRN: [id number] DOB: 1956/04/29 Today's Date: 07/07/2021   History of Present Illness Pt is a 66 y.o. male returning to [facility] on 1/25 after discharge on 1/24 for recent admission for progressively worsening Lt sided weakness. CT scan of brain significant for 3 masses (Rt and Lt) up to 3 cm in diameter with vasogenic edema present mass effect on the lateral ventricles, but no midline shift. CT and MRI in ED negative for acute intracranial findings such as CVA. 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   Clinical Impression   [name] is a 66 year old man who presents with left sided hemiplegia, impaired balance, abnormal muscle tone and recruitment and pain with some stretching. Patient has almost flaccid upper extremity except for minimal tricep and poor ROM and strength of the LLE. Appears to have some sensation and proprioception deficits of both upper and lower extremity. Patient leans to the left in sitting and standing and somewhat unaware. Patient currently requiring min-mod assist to stand and increased assistance with ADLs. Patient will benefit from skilled OT services while in hospital to improve deficits and learn compensatory strategies as needed in order to improve functional abilities.  Patient is very motivated and hopes to be able to go home and take care of himself eventually. Recommended CIR level of rehab if he qualifies. If not - patient will need SNF.         Recommendations for follow up therapy are one component of a multi-disciplinary discharge planning process, led by the attending physician.  Recommendations may be updated based on patient status, additional functional criteria and insurance authorization.   Follow Up Recommendations  Acute inpatient rehab  (3hours/day)    Assistance Recommended at Discharge Frequent or constant Supervision/Assistance  Patient can return home with the following A lot of help with walking and/or transfers;A lot of help with bathing/dressing/bathroom;Assistance with cooking/housework    Functional Status Assessment  Patient has had a recent decline in their functional status and demonstrates the ability to make significant improvements in function in a reasonable and predictable amount of time.  Equipment Recommendations  BSC/3in1;Tub/shower bench    Recommendations for Other Services       Precautions / Restrictions Precautions Precautions: Fall Precaution Comments: 6 falls in last month at least, pt re-admitted after being home for <1 hour due to fall Restrictions Weight Bearing Restrictions: No      Mobility Bed Mobility                    Transfers                          Balance Overall balance assessment: Needs assistance Sitting-balance support: No upper extremity supported Sitting balance-Leahy Scale: Fair     Standing balance support: Single extremity supported Standing balance-Leahy Scale: Fair                             ADL either performed or assessed with clinical judgement   ADL Overall ADL's : Needs assistance/impaired Eating/Feeding: Set up;Sitting   Grooming: Set up;Sitting   Upper Body Bathing: Moderate assistance;Sitting   Lower Body Bathing: Bed level;Moderate assistance   Upper Body Dressing : Moderate assistance   Lower Body Dressing: Maximal  assistance;Sit to/from stand   Toilet Transfer: Moderate assistance;+2 for safety/equipment;BSC/3in1   Toileting- Clothing Manipulation and Hygiene: Maximal assistance;Sitting/lateral lean       Functional mobility during ADLs: Moderate assistance General ADL Comments: Mod assist to transfer to side of bed. Min-mod assist for standing and attempts to take steps - patient drifting to the  left and mostly unaware.     Vision Baseline Vision/History: 1 Wears glasses Patient Visual Report: No change from baseline Vision Assessment?: No apparent visual deficits     Perception     Praxis      Pertinent Vitals/Pain Pain Assessment Pain Assessment: Faces Faces Pain Scale: Hurts little more Pain Location: shoulder/scapula with stretching Pain Descriptors / Indicators: Grimacing Pain Intervention(s): Limited activity within patient's tolerance     Hand Dominance Right   Extremity/Trunk Assessment Upper Extremity Assessment Upper Extremity Assessment: RUE deficits/detail;LUE deficits/detail RUE Deficits / Details: pt has a history of R shoulder surgery (reports replacement), WFL ROM and 5/5 strength RUE Sensation: WNL RUE Coordination: WNL LUE Deficits / Details: PROM of shoulder to 90 degrees (did not go above due to lack of scapular movement) and otherwise functional PROM. Slight tightness in PIP and DIPs, prominent tightness with external shoulder ROM, no sublux noted yet. No active ROM noted except for in tricep which is trace to less than poor. Cannot turn of tricep extension once activated and requires min assist to flex elbow. No active scapular movement as well and tight to the rib cage and laterally positioned. LUE Sensation: decreased proprioception;decreased light touch LUE Coordination: decreased fine motor;decreased gross motor   Lower Extremity Assessment Lower Extremity Assessment: Defer to PT evaluation   Cervical / Trunk Assessment Cervical / Trunk Assessment: Normal   Communication Communication Communication: No difficulties   Cognition Arousal/Alertness: Awake/alert Behavior During Therapy: WFL for tasks assessed/performed Overall Cognitive Status: Within Functional Limits for tasks assessed                                 General Comments: alert and oriented x4, aware of deficits     General Comments       Exercises      Shoulder Instructions      Home Living Family/patient expects to be discharged to:: Private residence Living Arrangements: Alone Available Help at Discharge: Friend(s);Available PRN/intermittently;Neighbor Type of Home: House Home Access: Stairs to enter Technical brewer of Steps: 1 Entrance Stairs-Rails: None Home Layout: One level     Bathroom Shower/Tub: Teacher, early years/pre: Standard Bathroom Accessibility: Yes   Home Equipment: None          Prior Functioning/Environment Prior Level of Function : Independent/Modified Independent             Mobility Comments: Prior to last admission pt reports he was independent with no device to mobilize. ADLs Comments: prior to last admission pt was Mod Independent for ADL's  but having multiple falls        OT Problem List: Decreased strength;Impaired balance (sitting and/or standing);Decreased range of motion;Decreased activity tolerance;Decreased coordination;Decreased knowledge of use of DME or AE;Impaired UE functional use;Impaired tone      OT Treatment/Interventions: Self-care/ADL training;DME and/or AE instruction;Therapeutic activities;Balance training;Therapeutic exercise;Neuromuscular education;Splinting;Patient/family education    OT Goals(Current goals can be found in the care plan section) Acute Rehab OT Goals Patient Stated Goal: move arm and leg OT Goal Formulation: With patient Time For Goal Achievement: 07/21/21 Potential  to Achieve Goals: Good  OT Frequency: Min 3X/week    Co-evaluation              AM-PAC OT "6 Clicks" Daily Activity     Outcome Measure Help from another person eating meals?: A Little Help from another person taking care of personal grooming?: A Little Help from another person toileting, which includes using toliet, bedpan, or urinal?: A Lot Help from another person bathing (including washing, rinsing, drying)?: A Lot Help from another person to put on and  taking off regular upper body clothing?: A Lot Help from another person to put on and taking off regular lower body clothing?: A Lot 6 Click Score: 14   End of Session Equipment Utilized During Treatment: Gait belt Nurse Communication: Mobility status  Activity Tolerance: Patient tolerated treatment well Patient left: in bed;with call bell/phone within reach;with bed alarm set  OT Visit Diagnosis: History of falling (Z91.81);Unsteadiness on feet (R26.81);Hemiplegia and hemiparesis Hemiplegia - Right/Left: Right Hemiplegia - dominant/non-dominant: Non-Dominant Hemiplegia - caused by: Unspecified                Time: 0301-3143 OT Time Calculation (min): 37 min Charges:  OT General Charges $OT Visit: 1 Visit OT Evaluation $OT Eval Moderate Complexity: 1 Mod  Lawrencia Mauney, OTR/L [place]  Office [phone] Pager: [place] 07/07/2021, 3:39 PM

## 2021-07-07 NOTE — TOC Progression Note (Signed)
Transition of Care [facility]) - Progression Note    Patient Details  Name: Stephen Diaz MRN: [id number] Date of Birth: 07/26/1955  Transition of Care [facility]) CM/SW Contact  Leeroy Cha, RN Phone Number: 07/07/2021, 8:00 AM  Clinical Narrative:     Possible cir admission following for toc needs     Barriers to Discharge: No Barriers Identified  Expected Discharge Plan and Services                                                 Social Determinants of Health (SDOH) Interventions    Readmission Risk Interventions No flowsheet data found.

## 2021-07-07 NOTE — Progress Notes (Signed)
Inpatient Rehab Admissions Coordinator:   Per PT recs pt was screened for CIR by Shann Medal, PT, DPT.  Note pt is observation at this time, and may not have the medical necessity to warrant an inpatient rehab admission at this time.  Also note rapid decline over a period of 3 days, oncology consult pending.  Will most certainly need increased support at home regardless of rehab venue.  Will rescreen tomorrow if pt switches to inpatient status.   Shann Medal, PT, DPT Admissions Coordinator [phone] 07/07/21  9:43 AM

## 2021-07-07 NOTE — Assessment & Plan Note (Addendum)
I discussed with neurooncology and radiation oncology.  He is not an operative candidate with lesion in the motor cortex.  Steroids even at 12 mg per day are not helping.  Radiation oncology plan urgent radiation.  Need to rule out subclnical seizures.  - Obtain EEG and start Keppra/consult Neuro if abnormal - Continue dexamethasone - Consult Rad Onc for radiation

## 2021-07-07 NOTE — Assessment & Plan Note (Signed)
-   Consult radiation oncology

## 2021-07-07 NOTE — Progress Notes (Signed)
Physical Therapy Treatment Patient Details Name: Stephen Diaz MRN: [id number] DOB: [birth year] Today's Date: 07/07/2021   History of Present Illness Pt is a 66 y.o. male returning to [facility] on 1/25 after discharge on 1/24 for recent admission for progressively worsening Lt sided weakness. CT scan of brain significant for 3 masses (Rt and Lt) up to 3 cm in diameter with vasogenic edema present mass effect on the lateral ventricles, but no midline shift. CT and MRI in ED negative for acute intracranial findings such as CVA. 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    PT Comments    Pt with OT on arrival.  Pt assisted with maintaining seated and standing neutral upright posture (pt tends to lean to left).  Pt not aware of leaning and required cues to correct.  Pt not able to take a step with Lt LE due to weakness and Lt LE remain in extensor pattern to maintain standing.  Pt states he can feel his LE however upon checking proprioception at ankle, pt only states 4/8 correct responses.  Pt may benefit from foam heel dressing and/or prevalon boot for left foot/ankle if left sided symptoms don't improve.   Continue to recommend CIR upon d/c.     Recommendations for follow up therapy are one component of a multi-disciplinary discharge planning process, led by the attending physician.  Recommendations may be updated based on patient status, additional functional criteria and insurance authorization.  Follow Up Recommendations  Acute inpatient rehab (3hours/day)     Assistance Recommended at Discharge Frequent or constant Supervision/Assistance  Patient can return home with the following Two people to help with walking and/or transfers;Assistance with cooking/housework;Assist for transportation;Help with stairs or ramp for entrance;Two people to help with  bathing/dressing/bathroom   Equipment Recommendations  Wheelchair (measurements PT);Wheelchair cushion (measurements PT)    Recommendations for Other Services       Precautions / Restrictions Precautions Precautions: Fall Precaution Comments: 6 falls in last month at least, pt re-admitted after being home for <1 hour due to fall Restrictions Weight Bearing Restrictions: No     Mobility  Bed Mobility Overal bed mobility: Needs Assistance Bed Mobility: Sit to Supine       Sit to supine: Mod assist   General bed mobility comments: assist for control back bed and positioning Lt LE    Transfers Overall transfer level: Needs assistance Equipment used:  (utilized back of chair for Rt UE) Transfers: Sit to/from Stand Sit to Stand: Min assist, Mod assist           General transfer comment: assist to rise and steady, Lt LE performs extensor pattern and pt utilizeds leaning lower legs against bed to self assist with balance, so had pt take small step away from bed; performed x3 for strengthening and technique (OT had Lt arm in mock sling)    Ambulation/Gait             Pre-gait activities: pt unable to lift Lt LE from floor or slide foot with resistance taken away so working on standing balance and posture at [employer] Mobility    Modified Rankin (Stroke Patients Only)       Balance Overall balance assessment: Needs assistance         Standing balance support: Single  extremity supported Standing balance-Leahy Scale: Poor Standing balance comment: reliant on UE support, requires assist at first however able to statically stand with Rt UE support                            Cognition Arousal/Alertness: Awake/alert Behavior During Therapy: WFL for tasks assessed/performed Overall Cognitive Status: Within Functional Limits for tasks assessed                                 General Comments: alert  and oriented x4, aware of deficits        Exercises      General Comments        Pertinent Vitals/Pain Pain Assessment Pain Assessment: No/denies pain Pain Intervention(s): Monitored during session, Repositioned    Home Living Family/patient expects to be discharged to:: Private residence Living Arrangements: Alone Available Help at Discharge: Friend(s);Available PRN/intermittently;Neighbor Type of Home: House Home Access: Stairs to enter Entrance Stairs-Rails: None Technical brewer of Steps: 1   Home Layout: One level Home Equipment: None      Prior Function            PT Goals (current goals can now be found in the care plan section) Acute Rehab PT Goals PT Goal Formulation: With patient Time For Goal Achievement: 07/20/21 Potential to Achieve Goals: Fair Progress towards PT goals: Progressing toward goals    Frequency    Min 3X/week      PT Plan Current plan remains appropriate;Equipment recommendations need to be updated    Co-evaluation              AM-PAC PT "6 Clicks" Mobility   Outcome Measure  Help needed turning from your back to your side while in a flat bed without using bedrails?: A Lot Help needed moving from lying on your back to sitting on the side of a flat bed without using bedrails?: A Lot Help needed moving to and from a bed to a chair (including a wheelchair)?: A Lot Help needed standing up from a chair using your arms (e.g., wheelchair or bedside chair)?: A Lot Help needed to walk in hospital room?: Total Help needed climbing 3-5 steps with a railing? : Total 6 Click Score: 10    End of Session Equipment Utilized During Treatment: Gait belt Activity Tolerance: Patient tolerated treatment well Patient left: in bed;with call bell/phone within reach;with bed alarm set Nurse Communication: Mobility status PT Visit Diagnosis: Difficulty in walking, not elsewhere classified (R26.2);Unsteadiness on feet (R26.81);Hemiplegia  and hemiparesis Hemiplegia - Right/Left: Left Hemiplegia - dominant/non-dominant: Non-dominant Hemiplegia - caused by: Unspecified     Time: 6767-2094 PT Time Calculation (min) (ACUTE ONLY): 25 min  Charges:  $Neuromuscular Re-education: 8-22 mins                    Jannette Spanner PT, DPT Acute Rehabilitation Services Pager: [phone] Office: [place] 07/07/2021, 5:14 PM

## 2021-07-07 NOTE — Assessment & Plan Note (Signed)
No active flare - Continue Breo, Incruse

## 2021-07-07 NOTE — Assessment & Plan Note (Signed)
Blood pressure normal - Continue lisinopril

## 2021-07-07 NOTE — Hospital Course (Addendum)
Mr. Strahm is a 66 y.o. 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  In the ER, MRI brain showed unchanged mets in the frontal lobes with vasogenic edema, no bleeding or midline shift.  Admitted and continued on dexamethasone, Neuro-oncology and Radiation oncology consulted.

## 2021-07-07 NOTE — Assessment & Plan Note (Signed)
-   Continue sliding scale corrections - Hold metformin

## 2021-07-07 NOTE — Progress Notes (Signed)
Progress Note   Patient: Stephen Diaz ATF:[id number] DOB: 15-Feb-1956 DOA: 07/06/2021     0 DOS: the patient was seen and examined on 07/07/2021       Brief hospital course: Stephen Diaz is a 66 y.o. 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  In the ER, MRI brain showed unchanged mets in the frontal lobes with vasogenic edema, no bleeding or midline shift.  Admitted and continued on dexamethasone, Neuro-oncology and Radiation oncology consulted.       Assessment and Plan * Vasogenic edema ([place])- (present on admission) Squamous cell carcinoma of lung, stage I, right ([place])- (present on admission) Metastasis to brain [facility])- (present on admission)  I discussed with neurooncology and radiation oncology.  He is not an operative candidate with lesion in the motor cortex.  Steroids even at 12 mg per day are not helping.  Radiation oncology plan urgent radiation.  Need to rule out subclnical seizures.  - Obtain EEG and start Keppra/consult Neuro if abnormal - Continue dexamethasone - Consult Rad Onc for radiation        Pressure injury of ischium, stage 1- (present on admission)     Essential hypertension- (present on admission) Blood pressure normal - Continue lisinopril   Type 2 diabetes mellitus (HCC) - Continue sliding scale corrections - Hold metformin  Chronic obstructive pulmonary disease ([place])- (present on admission) No active flare - Continue Breo, Incruse      Subjective: Patient has severe weakness in the left side.  He is distraught.  He has had no loss of consciousness, no tonic-clonic jerking.  Objective Vital signs were reviewed and unremarkable. Adult male, lying in bed, no acute distress.  He is flaccid in the left upper extremity.  His left lower extremity has muscle twitch, but cannot lift against gravity.  2/5.  The right side is normal.  There is little bit of left  facial droop I believe.  Speech is fluent. Heart rate regular, no murmurs, no lower extremity edema Respiratory rate normal, lungs clear without rales or wheezes.      Data Reviewed: Discussed with neurooncology, Dr. Mickeal Skinner, who will not be able to see the patient for several days.  Also discussed with radiation oncology who will initiate radiation therapy as soon as possible. My review of labs and imaging is notable for MRI showing a 2 cm mass in the left frontal gyrus, another 2 cm mass in the right motor cortex, and his MRI with contrast from a few days ago showed a small 3 mm mass also in the left motor cortex. BC metabolic panel unremarkable Complete blood count shows leukocytosis. COVID-negative Glucose is normal  Family Communication: Daughter by phone  Disposition: Status is: Inpatient  Remains inpatient appropriate because:  Prior to discharge 2 days ago, the patient had 4/5 functional strength in his left leg, was able to work with physical therapy.  However this is obviously progressed in the last 48 hours, and in fact when he got home, he fell and could not get up until someone found him, and here he now has 2/5 strength, nearly flaccid left lower extremity and arm  These are progressive acute neurological deficits from his brain metastasis.   He is now unsafe to discharge due to his progresive neurological deficits.  Will need radiation and above work up.  He is strongly motivated to return home.  Author: Edwin Dada, MD 07/07/2021 5:13 PM  For on call review [email or web address].

## 2021-07-07 NOTE — Progress Notes (Signed)
EEG complete - results pending 

## 2021-07-07 NOTE — Progress Notes (Deleted)
[facility] OFFICE PROGRESS NOTE  Stephen Chamberlain, PA-C [place] [address]  DIAGNOSIS: ***  PRIOR THERAPY:  CURRENT THERAPY:  INTERVAL HISTORY: Stephen Diaz 66 y.o. male returns for *** regular *** visit for followup of ***   MEDICAL HISTORY: Past Medical History:  Diagnosis Date   Arthritis    right shoulder   Cancer ([place])    COPD (chronic obstructive pulmonary disease) ([place])    Diabetes mellitus without complication ([place])    Type II   Hypertension    Right foot drop    from pinched nerve in back, wears brace    ALLERGIES:  has No Known Allergies.  MEDICATIONS:  No current facility-administered medications for this visit.   No current outpatient medications on file.   Facility-Administered Medications Ordered in Other Visits  Medication Dose Route Frequency Provider Last Rate Last Admin   acetaminophen (TYLENOL) tablet 650 mg  650 mg Oral Q6H PRN Reubin Milan, MD       Or   acetaminophen (TYLENOL) suppository 650 mg  650 mg Rectal Q6H PRN Reubin Milan, MD       albuterol (PROVENTIL) (2.5 MG/3ML) 0.083% nebulizer solution 2.5 mg  2.5 mg Nebulization Q6H PRN Reubin Milan, MD   2.5 mg at 07/07/21 1406   dexamethasone (DECADRON) tablet 4 mg  4 mg Oral TID Reubin Milan, MD   4 mg at 07/07/21 1017   fentaNYL (SUBLIMAZE) injection 25 mcg  25 mcg Intravenous Q2H PRN Reubin Milan, MD       fluticasone furoate-vilanterol (BREO ELLIPTA) 100-25 MCG/ACT 1 puff  1 puff Inhalation Daily Reubin Milan, MD   1 puff at 07/07/21 [phone]   And   umeclidinium bromide (INCRUSE ELLIPTA) 62.5 MCG/ACT 1 puff  1 puff Inhalation Daily Reubin Milan, MD   1 puff at 07/07/21 0836   insulin aspart (novoLOG) injection 0-15 Units  0-15 Units Subcutaneous TID WC Reubin Milan, MD   3 Units at 07/07/21 0900   lisinopril (ZESTRIL) tablet 10 mg  10 mg Oral Daily Reubin Milan, MD   10 mg at 07/06/21 1519   LORazepam  (ATIVAN) tablet 0.5 mg  0.5 mg Oral Daily PRN [name], PA-C       metFORMIN (GLUCOPHAGE) tablet 1,000 mg  1,000 mg Oral Q breakfast Reubin Milan, MD   1,000 mg at 07/07/21 0900   ondansetron (ZOFRAN) tablet 4 mg  4 mg Oral Q6H PRN Reubin Milan, MD       Or   ondansetron [facility]) injection 4 mg  4 mg Intravenous Q6H PRN Reubin Milan, MD       oxyCODONE (Oxy IR/ROXICODONE) immediate release tablet 5 mg  5 mg Oral Q4H PRN Reubin Milan, MD       tamsulosin [facility]) capsule 0.4 mg  0.4 mg Oral QPC breakfast Reubin Milan, MD   0.4 mg at 07/07/21 0900    SURGICAL HISTORY:  Past Surgical History:  Procedure Laterality Date   CATARACT EXTRACTION Left    CATARACT EXTRACTION W/PHACO Right 08/13/2020   Procedure: CATARACT EXTRACTION PHACO AND INTRAOCULAR LENS PLACEMENT ([place]);  Surgeon: Baruch Goldmann, MD;  Location: [facility];  Service: Ophthalmology;  Laterality: Right;  CDE: 8.24   COLONOSCOPY  12/2020   Dr. Adelina Mings (general surgeon): diverticulsos and left sided colitis with erythema in the sigmoid and descending colon. bx: focal mild acute colitis ?infection  vs ischemia   COLONOSCOPY  10/2009   Dr. Carlyon Prows [place]: diverticulosis, two tubular adenomas removed. descending colon erythema and mild ulcerations noted with benign biopsies.   EYE SURGERY     HERNIA REPAIR     INTERCOSTAL NERVE BLOCK Right 05/20/2021   Procedure: INTERCOSTAL NERVE BLOCK;  Surgeon: Melrose Nakayama, MD;  Location: [place];  Service: Thoracic;  Laterality: Right;   LUNG REMOVAL, PARTIAL Right    LYMPH NODE DISSECTION Right 05/20/2021   Procedure: LYMPH NODE DISSECTION;  Surgeon: Melrose Nakayama, MD;  Location: [place];  Service: Thoracic;  Laterality: Right;   REVERSE SHOULDER ARTHROPLASTY Right 09/08/2020   Procedure: REVERSE TOTAL SHOULDER ARTHROPLASTY;  Surgeon: Hiram Gash, MD;  Location: [facility];  Service: Orthopedics;  Laterality: Right;    screws and steel plate in neck  (I6-9)     10-12 years ago  x2    REVIEW OF SYSTEMS:   Review of Systems  Constitutional: Negative for appetite change, chills, fatigue, fever and unexpected weight change.  HENT:   Negative for mouth sores, nosebleeds, sore throat and trouble swallowing.   Eyes: Negative for eye problems and icterus.  Respiratory: Negative for cough, hemoptysis, shortness of breath and wheezing.   Cardiovascular: Negative for chest pain and leg swelling.  Gastrointestinal: Negative for abdominal pain, constipation, diarrhea, nausea and vomiting.  Genitourinary: Negative for bladder incontinence, difficulty urinating, dysuria, frequency and hematuria.   Musculoskeletal: Negative for back pain, gait problem, neck pain and neck stiffness.  Skin: Negative for itching and rash.  Neurological: Negative for dizziness, extremity weakness, gait problem, headaches, light-headedness and seizures.  Hematological: Negative for adenopathy. Does not bruise/bleed easily.  Psychiatric/Behavioral: Negative for confusion, depression and sleep disturbance. The patient is not nervous/anxious.     PHYSICAL EXAMINATION:  There were no vitals taken for this visit.  ECOG PERFORMANCE STATUS: {CHL ONC ECOG [id number]  Physical Exam  Constitutional: Oriented to person, place, and time and well-developed, well-nourished, and in no distress. No distress.  HENT:  Head: Normocephalic and atraumatic.  Mouth/Throat: Oropharynx is clear and moist. No oropharyngeal exudate.  Eyes: Conjunctivae are normal. Right eye exhibits no discharge. Left eye exhibits no discharge. No scleral icterus.  Neck: Normal range of motion. Neck supple.  Cardiovascular: Normal rate, regular rhythm, normal heart sounds and intact distal pulses.   Pulmonary/Chest: Effort normal and breath sounds normal. No respiratory distress. No wheezes. No rales.  Abdominal: Soft. Bowel sounds are normal. Exhibits no distension and no  mass. There is no tenderness.  Musculoskeletal: Normal range of motion. Exhibits no edema.  Lymphadenopathy:    No cervical adenopathy.  Neurological: Alert and oriented to person, place, and time. Exhibits normal muscle tone. Gait normal. Coordination normal.  Skin: Skin is warm and dry. No rash noted. Not diaphoretic. No erythema. No pallor.  Psychiatric: Mood, memory and judgment normal.  Vitals reviewed.  LABORATORY DATA: Lab Results  Component Value Date   WBC 16.3 (H) 07/06/2021   HGB 13.5 07/06/2021   HCT 42.3 07/06/2021   MCV 86.5 07/06/2021   PLT 277 07/06/2021      Chemistry      Component Value Date/Time   NA 135 07/06/2021 0526   K 3.9 07/06/2021 0526   CL 100 07/06/2021 0526   CO2 24 07/06/2021 0526   BUN 25 (H) 07/06/2021 0526   CREATININE 0.65 07/06/2021 0526   CREATININE 0.65 06/21/2021 1332      Component Value Date/Time  CALCIUM 9.2 07/06/2021 0526   ALKPHOS 65 07/06/2021 0526   AST 28 07/06/2021 0526   AST 16 06/21/2021 1332   ALT 36 07/06/2021 0526   ALT 24 06/21/2021 1332   BILITOT 0.8 07/06/2021 0526   BILITOT 0.2 (L) 06/21/2021 1332       RADIOGRAPHIC STUDIES:  DG Chest 2 View  Result Date: 06/14/2021 CLINICAL DATA:  Shortness of breath, history of lymph node dissection 12/22 EXAM: CHEST - 2 VIEW COMPARISON:  Chest radiograph 06/01/2021 FINDINGS: The cardiomediastinal silhouette is within normal limits. There remains a small to medium size right pleural effusion, decreased in size since 06/01/2021. Aeration in the right lung has significantly improved compared to that study. The left lung is clear. There is no significant left effusion. There is no pneumothorax. The bones are stable. Right shoulder arthroplasty hardware is again noted. IMPRESSION: Persistent small to medium size right pleural effusion, decreased in size since 06/01/2021, with significantly improved aeration in the right lung. Electronically Signed   By: Valetta Mole M.D.   On:  06/14/2021 10:25   CT HEAD WO CONTRAST (5MM)  Result Date: 07/06/2021 CLINICAL DATA:  Fall from chair. Weakness of left extremity for 2 weeks EXAM: CT HEAD WITHOUT CONTRAST TECHNIQUE: Contiguous axial images were obtained from the base of the skull through the vertex without intravenous contrast. RADIATION DOSE REDUCTION: This exam was performed according to the departmental dose-optimization program which includes automated exposure control, adjustment of the mA and/or kV according to patient size and/or use of iterative reconstruction technique. COMPARISON:  Brain MRI from 3 days ago FINDINGS: Brain: Known presumed brain metastases in the anterior left frontal and right posterior frontal lobes with cavitary morphology and extensive vasogenic edema. Metastatic disease is underestimated compared to MRI. No hemorrhage, infarct, or hydrocephalus. No new finding. Vascular: No hyperdense vessel or unexpected calcification. Skull: No acute or aggressive finding Sinuses/Orbits: Bilateral cataract resection IMPRESSION: Known presumed brain metastases seen in the left anterior frontal and right posterior frontal lobes with extensive vasogenic edema. No change since brain MRI 3 days ago. Electronically Signed   By: Jorje Guild M.D.   On: 07/06/2021 07:42   CT HEAD W & WO CONTRAST (5MM)  Result Date: 07/03/2021 CLINICAL DATA:  66 year old male with multiple falls. Neurologic deficit. Left extremity numbness for 2 days. Cavitary right lung lesion suspicious for lung cancer in October. EXAM: CT HEAD WITHOUT AND WITH CONTRAST TECHNIQUE: Contiguous axial images were obtained from the base of the skull through the vertex without and with intravenous contrast. RADIATION DOSE REDUCTION: This exam was performed according to the departmental dose-optimization program which includes automated exposure control, adjustment of the mA and/or kV according to patient size and/or use of iterative reconstruction technique.  CONTRAST:  84mL OMNIPAQUE IOHEXOL 300 MG/ML  SOLN COMPARISON:  PET-CT 04/04/2021. FINDINGS: Brain: Multifocal cerebral vasogenic edema. Anterior left frontal lobe 3 cm rim enhancing mass (series 7, image 21) with regional vasogenic edema. Posterior right superior frontal gyrus 2.6 cm rim enhancing mass with regional vasogenic edema. This lesion is perirolandic, with enhancement and edema subsequently affecting the left extremity motor and sensory areas. Mass effect on the lateral ventricles, but no midline shift. Basilar cisterns remain patent. No ventriculomegaly. No other No abnormal enhancement identified. No acute intracranial hemorrhage identified. No cortically based acute infarct identified. Vascular: Mild Calcified atherosclerosis at the skull base. Major intracranial vascular structures are enhancing as expected and appear to be patent. Skull: No acute or suspicious osseous lesion identified.  Sinuses/Orbits: Visualized paranasal sinuses and mastoids are clear. Other: Postoperative changes to both globes. No acute orbit or scalp soft tissue finding. IMPRESSION: 1. Positive for Metastatic Disease to the brain. Two rim enhancing masses, up to 3 cm diameter, with vasogenic edema. Superior Right Perirolandic Cortex and anterior left frontal lobe affected. Mass effect on the lateral ventricles but no midline shift and basilar cisterns remain normal. 2. Brain MRI without and with contrast might detect additional lesions. Electronically Signed   By: Genevie Ann M.D.   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 COMPARISON:  Same-day noncontrast head CT, MR head 07/03/2021 FINDINGS: Brain: There is no evidence of acute  intracranial hemorrhage, extra-axial fluid collection, or acute infarct. Again seen are metastatic lesions in the left frontal lobe centered at the middle frontal gyrus and right precentral gyrus, unchanged in size compared to the recent study from 07/03/2021 measured on the noncontrast T1 sequence. SWI signal dropout within the right frontal lesion is unchanged, consistent with blood products. Perilesional edema in the left frontal lobe and right frontal and parietal lobes extending inferiorly to the right corona radiata is unchanged. There is partial effacement of the right lateral ventricle but no midline shift, unchanged. The additional 3 mm enhancing lesion seen in the left precentral gyrus is not seen on the current study in the absence of intravenous contrast. Vascular: Normal flow voids. Skull and upper cervical spine: Normal marrow signal. Upper cervical spine fusion hardware is partially imaged. Sinuses/Orbits: The paranasal sinuses are clear. Bilateral lens implants are in place. The globes and orbits are otherwise unremarkable. Other: There is a partially imaged T2 hyperintense lesion in the left neck just inferolateral to the parotid gland. There is associated diffusion restriction (13-47). This lesion was present on the PET-CT from 04/04/2021 and was not hypermetabolic. IMPRESSION: 1. Unchanged size and noncontrast appearance of the metastatic lesions in the bilateral frontal lobes as above with unchanged extensive perilesional edema but no midline shift. 2. No evidence of acute intracranial hemorrhage or infarct. 3. Partially imaged cystic lesion in the left neck inferolateral to the parotid gland was present on the prior PET-CT from 04/04/2021 and was not hypermetabolic on that study. This may reflect a sebaceous cyst; however, a necrotic lymph node can not be entirely excluded. Recommend attention on follow-up PET-CTs. Electronically Signed   By: Valetta Mole M.D.   On: 07/06/2021 13:55   MR BRAIN  W WO CONTRAST  Result Date: 07/03/2021 CLINICAL DATA:  Rule out metastatic disease. Squamous cell carcinoma lung. Abnormal head CT. EXAM: MRI HEAD WITHOUT AND WITH CONTRAST TECHNIQUE: Multiplanar, multiecho pulse sequences of the brain and surrounding structures were obtained without and with intravenous contrast. CONTRAST:  8.49mL GADAVIST GADOBUTROL 1 MMOL/ML IV SOLN COMPARISON:  CT head 07/03/2021 FINDINGS: Brain: 3 enhancing lesions in the brain are present compatible with metastatic disease. 2.8 cm left frontal mass with peripheral enhancement and central necrosis. Minimal associated hemorrhage. Moderate surrounding edema with mild midline shift to the right. 2.3 cm lesion in the precentral gyrus on the right. There is central necrosis with internal hemorrhage. Moderate white matter edema and local mass effect. 3 mm enhancing lesion left precentral gyrus without significant edema compatible with metastatic disease. Ventricle size normal.  Negative  for acute infarct. Vascular: Normal arterial flow voids Skull and upper cervical spine: Negative Sinuses/Orbits: Paranasal sinuses clear. Bilateral cataract extraction Other: None IMPRESSION: Findings compatible with metastatic disease to brain. Three lesions are identified. Left frontal lesion and right precentral gyrus lesion show significant edema and local mass effect. 3 mm lesion left precentral gyrus. Electronically Signed   By: Franchot Gallo M.D.   On: 07/03/2021 11:36   CT CHEST ABDOMEN PELVIS W CONTRAST  Result Date: 07/03/2021 CLINICAL DATA:  Brain metastatic disease, status post right lower lobectomy EXAM: CT CHEST, ABDOMEN, AND PELVIS WITH CONTRAST TECHNIQUE: Multidetector CT imaging of the chest, abdomen and pelvis was performed following the standard protocol during bolus administration of intravenous contrast. RADIATION DOSE REDUCTION: This exam was performed according to the departmental dose-optimization program which includes automated exposure  control, adjustment of the mA and/or kV according to patient size and/or use of iterative reconstruction technique. CONTRAST:  164mL OMNIPAQUE IOHEXOL 300 MG/ML SOLN, additional oral enteric contrast COMPARISON:  None. FINDINGS: CT CHEST FINDINGS Cardiovascular: Aortic atherosclerosis. Normal heart size. Three-vessel coronary artery calcifications and stents. No pericardial effusion. Mediastinum/Nodes: No enlarged mediastinal, hilar, or axillary lymph nodes. Thyroid gland, trachea, and esophagus demonstrate no significant findings. Lungs/Pleura: Status post right lower lobectomy. Trace, loculated right pleural effusion, with a small, loculated air component superiorly and dependently (series 5, image 86). Moderate centrilobular emphysema. Clustered centrilobular and tree-in-bud ground-glass opacities in the bilateral lung bases (series 5, image 126). No pleural effusion or pneumothorax. Musculoskeletal: No chest wall mass or suspicious osseous lesions identified. CT ABDOMEN PELVIS FINDINGS Hepatobiliary: No solid liver abnormality is seen. Faintly rim calcified gallstone in the gallbladder. No gallbladder wall thickening, or biliary dilatation. Pancreas: Unremarkable. No pancreatic ductal dilatation or surrounding inflammatory changes. Spleen: Normal in size without significant abnormality. Adrenals/Urinary Tract: Adrenal glands are unremarkable. Kidneys are normal, without renal calculi, solid lesion, or hydronephrosis. Bladder is unremarkable. Stomach/Bowel: Stomach is within normal limits. Appendix appears normal. No evidence of bowel wall thickening, distention, or inflammatory changes. Descending and sigmoid diverticulosis. Vascular/Lymphatic: Aortic atherosclerosis. No enlarged abdominal or pelvic lymph nodes. Reproductive: No mass or other abnormality. Other: No abdominal wall hernia or abnormality. No ascites. Musculoskeletal: No acute osseous findings. IMPRESSION: 1. Status post right lower lobectomy. 2. No  evidence of lymphadenopathy or metastatic disease in the chest, abdomen, or pelvis. Please note, that in order to ensure subspecialty interpretation, restaging examinations for known malignancies should generally be deferred to the nonemergent, outpatient setting. 3. Clustered centrilobular and tree-in-bud ground-glass opacities in the bilateral lung bases. Findings suggest atypical infection or aspiration. 4. Cholelithiasis. 5. Coronary artery disease. Aortic Atherosclerosis (ICD10-I70.0) and Emphysema (ICD10-J43.9). Electronically Signed   By: Delanna Ahmadi M.D.   On: 07/03/2021 13:29   DG Chest Port 1 View  Result Date: 07/03/2021 CLINICAL DATA:  Left arm and leg numbness.  Multiple falls. EXAM: PORTABLE CHEST 1 VIEW COMPARISON:  06/14/2021. FINDINGS: The heart size and mediastinal contours are within normal limits. Atherosclerotic calcification of the aorta is noted. There is a persistent small to moderate right pleural effusion with mild atelectasis at the right lung base. The left lung is clear. No pneumothorax. Shoulder arthroplasty changes are noted on the right. IMPRESSION: Persistent small to moderate right pleural effusion, not significantly changed from the prior exam. Electronically Signed   By: Brett Fairy M.D.   On: 07/03/2021 04:56     ASSESSMENT/PLAN:  No problem-specific Assessment & Plan notes found for this encounter.   No orders  of the defined types were placed in this encounter.    I spent {CHL ONC TIME VISIT - PPNDL:[id number]} counseling the patient face to face. The total time spent in the appointment was {CHL ONC TIME VISIT - ZGFUQ:[id number]}.  Ashanti Ratti L Lendell Gallick, PA-C 07/07/21

## 2021-07-08 ENCOUNTER — Ambulatory Visit
Admission: RE | Admit: 2021-07-08 | Discharge: 2021-07-08 | Disposition: A | Discharge status: Home / Self Care | Payer: Medicare Other | Source: Ambulatory Visit | Attending: Radiation Oncology | Admitting: Radiation Oncology

## 2021-07-08 DIAGNOSIS — R531 Weakness: Secondary | ICD-10-CM

## 2021-07-08 DIAGNOSIS — R569 Unspecified convulsions: Secondary | ICD-10-CM

## 2021-07-08 DIAGNOSIS — G936 Cerebral edema: Secondary | ICD-10-CM

## 2021-07-08 DIAGNOSIS — C7931 Secondary malignant neoplasm of brain: Principal | ICD-10-CM

## 2021-07-08 LAB — GLUCOSE, CAPILLARY
Glucose-Capillary: 151 mg/dL — ABNORMAL HIGH (ref 70–99)
Glucose-Capillary: 141 mg/dL — ABNORMAL HIGH (ref 70–99)
Glucose-Capillary: 180 mg/dL — ABNORMAL HIGH (ref 70–99)
Glucose-Capillary: 148 mg/dL — ABNORMAL HIGH (ref 70–99)

## 2021-07-08 MED ORDER — LISINOPRIL 5 MG PO TABS
5.0000 mg | ORAL_TABLET | Freq: Every day | ORAL | Status: DC
  Administered 2021-07-09 – 2021-07-19 (×9): 5 mg via ORAL
  Filled 2021-07-08 (×11): qty 1

## 2021-07-08 MED ORDER — LEVETIRACETAM 500 MG PO TABS
500.0000 mg | ORAL_TABLET | Freq: Two times a day (BID) | ORAL | Status: DC
  Administered 2021-07-08 – 2021-07-19 (×23): 500 mg via ORAL
  Filled 2021-07-08 (×23): qty 1

## 2021-07-08 NOTE — Procedures (Addendum)
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  EEG to evaluate for seizure.  Level of alertness: Awake, asleep  AEDs during EEG study: None  Technical aspects: This EEG study was done with scalp electrodes positioned according to the 10-20 International system of electrode placement. Electrical activity was acquired at a sampling rate of 500Hz  and reviewed with a high frequency filter of 70Hz  and a low frequency filter of 1Hz . EEG data were recorded continuously and digitally stored.   Description: The posterior dominant rhythm consists of 8Hz  activity of moderate voltage (25-35 uV) seen predominantly in posterior head regions, symmetric and reactive to eye opening and eye closing. Sleep was characterized by vertex waves, sleep spindles (12 to 14 Hz), maximal frontocentral region. Single sharp transient was noted in right frontal temporal region. Hyperventilation and photic stimulation were not performed.     IMPRESSION: This study is within normal limits. No seizures or definite epileptiform discharges were seen throughout the recording.  Stephen Diaz

## 2021-07-08 NOTE — Progress Notes (Signed)
PROGRESS NOTE    Stephen Diaz  JEH:[id number] DOB: Nov 12, 1955 DOA: 07/06/2021 PCP: [name], PA-C    Chief Complaint  Patient presents with   Fall    Brief Narrative:  [name] is a 66 y.o. 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   In the ER, MRI brain showed unchanged mets in the frontal lobes with vasogenic edema, no bleeding or midline shift.  Admitted and continued on dexamethasone, Neuro-oncology and Radiation oncology consulted.    Subjective:  Reports left side is weak, no weakness on right side  Assessment & Plan:   Principal Problem:   Vasogenic edema (HCC) Active Problems:   Chronic obstructive pulmonary disease (HCC)   Type 2 diabetes mellitus (HCC)   Squamous cell carcinoma of lung, stage I, right (HCC)   Metastasis to brain [facility])   Essential hypertension   Dyslipidemia   Acute left-sided weakness   Pressure injury of ischium, stage 1   Brain metastases (HCC)  Vasogenic edema/lung cancer with brain mets -Presented with left-sided weakness -MRI brain showed peripheral enhancement and nodularity of the 2 cystic lesions of the frontal lobes, which remain consistent with metastatic disease. No other lesions. -EEG no seizure -On steroid and Keppra -PT eval -Palliative care consult for goals of care discussion  COPD Currently on wheezing, no room air Continue home meds  Non-insulin-dependent type 2 diabetes Monitor blood glucose while on steroid Currently on SSI  Hypertension BP low normal, decrease lisinopril    Body mass index is 23.65 kg/m.Marland Kitchen   Skin Assessment:  I have examined the patients skin and I agree with the wound assessment as performed by the wound care RN as outlined below:  Pressure Injury 07/06/21 Pelvis Left;Lateral Stage 1 -  Intact skin with non-blanchable redness of a localized area usually over a bony prominence. (Active)   07/06/21 2045  Location: Pelvis  Location Orientation: Left;Lateral  Staging: Stage 1 -  Intact skin with non-blanchable redness of a localized area usually over a bony prominence.  Wound Description (Comments):   Present on Admission: Yes    Unresulted Labs (From admission, onward)     Start     Ordered   07/09/21 3785  Basic metabolic panel  Tomorrow morning,   R       Question:  Specimen collection method  Answer:  Lab=Lab collect   07/08/21 2300              DVT prophylaxis: SCDs Start: 07/06/21 1644   Code Status: Full Family Communication: Patient Disposition:   Status is: Inpatient  Dispo: The patient is from: Home              Anticipated d/c is to: TBD              Anticipated d/c date is: TBD                   Objective: Vitals:   07/08/21 1356 07/08/21 1546 07/08/21 2032 07/08/21 2125  BP: 97/72 103/72  98/66  Pulse: 92 90  84  Resp: 16   20  Temp: (!) 97.4 F (36.3 C) 97.6 F (36.4 C)  (!) 97.1 F (36.2 C)  TempSrc: Oral   Oral  SpO2: 97% 96% 96% 96%  Weight:      Height:        Intake/Output Summary (Last 24 hours) at 07/08/2021 2300 Last  data filed at 07/08/2021 1720 Gross per 24 hour  Intake 600 ml  Output 475 ml  Net 125 ml   Filed Weights   07/06/21 0356 07/06/21 2045  Weight: 79.4 kg 81.3 kg    Examination:  General exam: alert, awake, communicative,calm, NAD Respiratory system: decreased on right , no wheezing, occasional rhonchi , clears with cough, Respiratory effort normal. Cardiovascular system:  RRR.  Gastrointestinal system: Abdomen is nondistended, soft and nontender.  Normal bowel sounds heard. Central nervous system: Alert and oriented.  Left hemiparesis Extremities:  no edema Skin: No rashes, lesions or ulcers Psychiatry: Judgement and insight appear normal. Mood & affect appropriate.     Data Reviewed: I have personally reviewed following labs and imaging studies  CBC: Recent Labs  Lab 07/02/21 2003  07/02/21 2024 07/04/21 0352 07/06/21 0526  WBC 13.5*  --  10.2 16.3*  NEUTROABS 8.5*  --   --   --   HGB 13.2 13.9 11.5* 13.5  HCT 40.2 41.0 35.1* 42.3  MCV 84.6  --  82.8 86.5  PLT 353  --  272 546    Basic Metabolic Panel: Recent Labs  Lab 07/02/21 2003 07/02/21 2024 07/04/21 0352 07/06/21 0526  NA 136 137 133* 135  K 4.3 4.2 3.9 3.9  CL 103 102 102 100  CO2 23  --  24 24  GLUCOSE 133* 124* 156* 126*  BUN 12 12 17  25*  CREATININE 0.70 0.70 0.53* 0.65  CALCIUM 9.3  --  8.8* 9.2    GFR: Estimated Creatinine Clearance: 102.6 mL/min (by C-G formula based on SCr of 0.65 mg/dL).  Liver Function Tests: Recent Labs  Lab 07/02/21 2003 07/06/21 0526  AST 20 28  ALT 20 36  ALKPHOS 82 65  BILITOT 0.5 0.8  PROT 7.7 7.4  ALBUMIN 3.7 3.8    CBG: Recent Labs  Lab 07/07/21 2057 07/08/21 0740 07/08/21 1134 07/08/21 1709 07/08/21 2115  GLUCAP 176* 151* 141* 180* 148*     Recent Results (from the past 240 hour(s))  Resp Panel by RT-PCR (Flu A&B, Covid) Nasopharyngeal Swab     Status: None   Collection Time: 07/02/21  9:17 PM   Specimen: Nasopharyngeal Swab; Nasopharyngeal(NP) swabs in vial transport medium  Result Value Ref Range Status   SARS Coronavirus 2 by RT PCR NEGATIVE NEGATIVE Final    Comment: (NOTE) SARS-CoV-2 target nucleic acids are NOT DETECTED.  The SARS-CoV-2 RNA is generally detectable in upper respiratory specimens during the acute phase of infection. The lowest concentration of SARS-CoV-2 viral copies this assay can detect is 138 copies/mL. A negative result does not preclude SARS-Cov-2 infection and should not be used as the sole basis for treatment or other patient management decisions. A negative result may occur with  improper specimen collection/handling, submission of specimen other than nasopharyngeal swab, presence of viral mutation(s) within the areas targeted by this assay, and inadequate number of viral copies(<138 copies/mL). A  negative result must be combined with clinical observations, patient history, and epidemiological information. The expected result is Negative.  Fact Sheet for Patients:  EntrepreneurPulse.com.au  Fact Sheet for Healthcare Providers:  IncredibleEmployment.be  This test is no t yet approved or cleared by the [place] FDA and  has been authorized for detection and/or diagnosis of SARS-CoV-2 by FDA under an Emergency Use Authorization (EUA). This EUA will remain  in effect (meaning this test can be used) for the duration of the COVID-19 declaration under Section 564(b)(1) of the Act, 21  U.S.C.section 360bbb-3(b)(1), unless the authorization is terminated  or revoked sooner.       Influenza A by PCR NEGATIVE NEGATIVE Final   Influenza B by PCR NEGATIVE NEGATIVE Final    Comment: (NOTE) The Xpert Xpress SARS-CoV-2/FLU/RSV plus assay is intended as an aid in the diagnosis of influenza from Nasopharyngeal swab specimens and should not be used as a sole basis for treatment. Nasal washings and aspirates are unacceptable for Xpert Xpress SARS-CoV-2/FLU/RSV testing.  Fact Sheet for Patients: EntrepreneurPulse.com.au  Fact Sheet for Healthcare Providers: IncredibleEmployment.be  This test is not yet approved or cleared by the [place] FDA and has been authorized for detection and/or diagnosis of SARS-CoV-2 by FDA under an Emergency Use Authorization (EUA). This EUA will remain in effect (meaning this test can be used) for the duration of the COVID-19 declaration under Section 564(b)(1) of the Act, 21 U.S.C. section 360bbb-3(b)(1), unless the authorization is terminated or revoked.  Performed at [facility], [place] [address] [place], [place] [address]   Resp Panel by RT-PCR (Flu A&B, Covid) Nasopharyngeal Swab     Status: None   Collection Time: 07/06/21  3:44 PM   Specimen: Nasopharyngeal Swab;  Nasopharyngeal(NP) swabs in vial transport medium  Result Value Ref Range Status   SARS Coronavirus 2 by RT PCR NEGATIVE NEGATIVE Final    Comment: (NOTE) SARS-CoV-2 target nucleic acids are NOT DETECTED.  The SARS-CoV-2 RNA is generally detectable in upper respiratory specimens during the acute phase of infection. The lowest concentration of SARS-CoV-2 viral copies this assay can detect is 138 copies/mL. A negative result does not preclude SARS-Cov-2 infection and should not be used as the sole basis for treatment or other patient management decisions. A negative result may occur with  improper specimen collection/handling, submission of specimen other than nasopharyngeal swab, presence of viral mutation(s) within the areas targeted by this assay, and inadequate number of viral copies(<138 copies/mL). A negative result must be combined with clinical observations, patient history, and epidemiological information. The expected result is Negative.  Fact Sheet for Patients:  EntrepreneurPulse.com.au  Fact Sheet for Healthcare Providers:  IncredibleEmployment.be  This test is no t yet approved or cleared by the [place] FDA and  has been authorized for detection and/or diagnosis of SARS-CoV-2 by FDA under an Emergency Use Authorization (EUA). This EUA will remain  in effect (meaning this test can be used) for the duration of the COVID-19 declaration under Section 564(b)(1) of the Act, 21 U.S.C.section 360bbb-3(b)(1), unless the authorization is terminated  or revoked sooner.       Influenza A by PCR NEGATIVE NEGATIVE Final   Influenza B by PCR NEGATIVE NEGATIVE Final    Comment: (NOTE) The Xpert Xpress SARS-CoV-2/FLU/RSV plus assay is intended as an aid in the diagnosis of influenza from Nasopharyngeal swab specimens and should not be used as a sole basis for treatment. Nasal washings and aspirates are unacceptable for Xpert Xpress  SARS-CoV-2/FLU/RSV testing.  Fact Sheet for Patients: EntrepreneurPulse.com.au  Fact Sheet for Healthcare Providers: IncredibleEmployment.be  This test is not yet approved or cleared by the [place] FDA and has been authorized for detection and/or diagnosis of SARS-CoV-2 by FDA under an Emergency Use Authorization (EUA). This EUA will remain in effect (meaning this test can be used) for the duration of the COVID-19 declaration under Section 564(b)(1) of the Act, 21 U.S.C. section 360bbb-3(b)(1), unless the authorization is terminated or revoked.  Performed at [facility], [address] [place],  [place] [address]          Radiology Studies: MR BRAIN W WO CONTRAST  Result Date: 07/07/2021 CLINICAL DATA:  Brain/CNS neoplasm, staging. EXAM: MRI HEAD WITHOUT AND WITH CONTRAST TECHNIQUE: Multiplanar, multiecho pulse sequences of the brain and surrounding structures were obtained without and with intravenous contrast. CONTRAST:  88mL GADAVIST GADOBUTROL 1 MMOL/ML IV SOLN COMPARISON:  07/06/2021 FINDINGS: Brain: Unchanged appearance of predominantly cystic lesions of the frontal lobes with severe surrounding edema. There is no internal diffusion restriction. Small amount of associated chronic blood products without acute hemorrhage. There is peripheral nodular contrast enhancement within both lesions, more notable on the right. There is no herniation. There are no other lesions. Vascular: Major flow voids are preserved. Skull and upper cervical spine: Normal calvarium and skull base. Visualized upper cervical spine and soft tissues are normal. Sinuses/Orbits:No paranasal sinus fluid levels or advanced mucosal thickening. No mastoid or middle ear effusion. Normal orbits. IMPRESSION: Peripheral enhancement and nodularity of the 2 cystic lesions of the frontal lobes, which remain consistent with metastatic disease. No other lesions.  Electronically Signed   By: Ulyses Jarred M.D.   On: 07/07/2021 23:08   EEG adult  Result Date: 07/08/2021 Lora Havens, MD     07/08/2021  8:38 AM Patient Name: Stephen Diaz MRN: [id number] Epilepsy Attending: Lora Havens Referring Physician/Provider: Edwin Dada, MD Date: 1/26/223 Duration: 21.51 mins Patient history: 66 year old male with metastatic squamous lung cell carcinoma with mets to brain presented with acute left-sided weakness.  EEG to evaluate for seizure. Level of alertness: Awake, asleep AEDs during EEG study: None Technical aspects: This EEG study was done with scalp electrodes positioned according to the 10-20 International system of electrode placement. Electrical activity was acquired at a sampling rate of 500Hz  and reviewed with a high frequency filter of 70Hz  and a low frequency filter of 1Hz . EEG data were recorded continuously and digitally stored. Description: The posterior dominant rhythm consists of 8Hz  activity of moderate voltage (25-35 uV) seen predominantly in posterior head regions, symmetric and reactive to eye opening and eye closing. Sleep was characterized by vertex waves, sleep spindles (12 to 14 Hz), maximal frontocentral region. Single sharp transient was noted in right frontal temporal region. Hyperventilation and photic stimulation were not performed.   IMPRESSION: This study is within normal limits. No seizures or definite epileptiform discharges were seen throughout the recording. Priyanka Barbra Sarks        Scheduled Meds:  dexamethasone  4 mg Oral TID   fluticasone furoate-vilanterol  1 puff Inhalation Daily   And   umeclidinium bromide  1 puff Inhalation Daily   insulin aspart  0-15 Units Subcutaneous TID WC   levETIRAcetam  500 mg Oral BID   [START ON 07/09/2021] lisinopril  5 mg Oral Daily   metFORMIN  1,000 mg Oral Q breakfast   tamsulosin  0.4 mg Oral QPC breakfast   Continuous Infusions:   LOS: 1 day    Greater than 50%  of this time was spent in counseling, explanation of diagnosis, planning of further management, and coordination of care.   Voice Recognition Viviann Spare dictation system was used to create this note, attempts have been made to correct errors. Please contact the author with questions and/or clarifications.   Florencia Reasons, MD PhD FACP [facility]  Available via Epic secure chat 7am-7pm for nonurgent issues Please page for urgent issues To page the attending provider between 7A-7P or the covering provider during after hours  7P-7A, please log into the web site [email or web address] and access using universal Natalia password for that web site. If you do not have the password, please call the hospital operator.    07/08/2021, 11:00 PM

## 2021-07-09 DIAGNOSIS — G936 Cerebral edema: Secondary | ICD-10-CM | POA: Diagnosis not present

## 2021-07-09 LAB — BASIC METABOLIC PANEL
Anion gap: 9 (ref 5–15)
BUN: 22 mg/dL (ref 8–23)
CO2: 25 mmol/L (ref 22–32)
Calcium: 8.8 mg/dL — ABNORMAL LOW (ref 8.9–10.3)
Chloride: 96 mmol/L — ABNORMAL LOW (ref 98–111)
Creatinine, Ser: 0.66 mg/dL (ref 0.61–1.24)
GFR, Estimated: >60 mL/min (ref >60)
Glucose, Bld: 140 mg/dL — ABNORMAL HIGH (ref 70–99)
Potassium: 4.2 mmol/L (ref 3.5–5.1)
Sodium: 130 mmol/L — ABNORMAL LOW (ref 135–145)

## 2021-07-09 LAB — GLUCOSE, CAPILLARY
Glucose-Capillary: 133 mg/dL — ABNORMAL HIGH (ref 70–99)
Glucose-Capillary: 145 mg/dL — ABNORMAL HIGH (ref 70–99)
Glucose-Capillary: 153 mg/dL — ABNORMAL HIGH (ref 70–99)
Glucose-Capillary: 209 mg/dL — ABNORMAL HIGH (ref 70–99)

## 2021-07-09 MED ORDER — GUAIFENESIN-DM 100-10 MG/5ML PO SYRP
5.0000 mL | ORAL_SOLUTION | ORAL | Status: DC | PRN
  Administered 2021-07-09 – 2021-07-18 (×8): 5 mL via ORAL
  Filled 2021-07-09 (×8): qty 10

## 2021-07-09 NOTE — Progress Notes (Signed)
PROGRESS NOTE  TULLIO CHAUSSE HWT:[id number] DOB: 1956-04-19 DOA: 07/06/2021 PCP: [name], PA-C   LOS: 2 days   Brief Narrative / Interim history: 66 year old male with metastatic lung cancer to the brain, COPD, DM 2, HTN who comes into the hospital with worsening left-sided weakness.  Imaging in the ER with an MRI showed frontal lobes metastatic disease with vasogenic edema, no bleeding or midline shift.  Dr. Loleta Books discussed with neurooncology and radiation oncology with plans for urgent radiation therapy likely on Monday.  Subjective / 24h Interval events: Complains of persistent left-sided weakness, both in the arm and leg.  Assessment & Plan: Principal problem Left-sided weakness due to brain metastasis with vasogenic edema-underwent an EEG on admission which was unremarkable for seizures.  He was empirically started on Keppra, Decadron, continue.  I do not see any notes in the computer from radiation oncology or oncology but apparently Dr. Loleta Books discussed shortly after admission and plans are to start radiation therapy on Monday.  Palliative consulted  Active problems Stage IV lung cancer-see above  Hypertension-continue lisinopril  DM2-continue metformin, sliding scale.  He is on steroids  CBG (last 3)  Recent Labs    07/08/21 1709 07/08/21 2115 07/09/21 0740  GLUCAP 180* 148* 133*   COPD-no wheezing, stable, continue home medications  BPH-continue tamsulosin  Hyponatremia-likely due to brain mets.  Monitor sodium level.  Scheduled Meds:  dexamethasone  4 mg Oral TID   fluticasone furoate-vilanterol  1 puff Inhalation Daily   And   umeclidinium bromide  1 puff Inhalation Daily   insulin aspart  0-15 Units Subcutaneous TID WC   levETIRAcetam  500 mg Oral BID   lisinopril  5 mg Oral Daily   metFORMIN  1,000 mg Oral Q breakfast   tamsulosin  0.4 mg Oral QPC breakfast   Continuous Infusions: PRN Meds:.acetaminophen **OR** acetaminophen, albuterol,  docusate sodium, fentaNYL (SUBLIMAZE) injection, LORazepam, ondansetron **OR** ondansetron (ZOFRAN) IV, oxyCODONE  Diet Orders (From admission, onward)     Start     Ordered   07/06/21 1646  Diet Heart Room service appropriate? Yes; Fluid consistency: Thin  Diet effective now       Question Answer Comment  Room service appropriate? Yes   Fluid consistency: Thin      07/06/21 1646            DVT prophylaxis: SCDs Start: 07/06/21 1644   Lab Results  Component Value Date   PLT 277 07/06/2021      Code Status: Full Code  Family Communication: No family at bedside  Status is: Inpatient  Remains inpatient appropriate because: Persistent weakness, urgent treatment required  Level of care: Med-Surg  Consultants:  RadOnc Palliative care  Procedures:  none  Microbiology  none  Antimicrobials: none    Objective: Vitals:   07/08/21 2032 07/08/21 2125 07/09/21 0300 07/09/21 0821  BP:  98/66 103/80   Pulse:  84 66   Resp:  20 16   Temp:  (!) 97.1 F (36.2 C) (!) 97.2 F (36.2 C)   TempSrc:  Oral Axillary   SpO2: 96% 96% 98% 97%  Weight:      Height:        Intake/Output Summary (Last 24 hours) at 07/09/2021 1116 Last data filed at 07/09/2021 0900 Gross per 24 hour  Intake 600 ml  Output 975 ml  Net -375 ml   Wt Readings from Last 3 Encounters:  07/06/21 81.3 kg  07/05/21 83.7 kg  06/24/21 86.5 kg  Examination:  Constitutional: NAD Eyes: no scleral icterus ENMT: Mucous membranes are moist.  Neck: normal, supple Respiratory: clear to auscultation bilaterally, no wheezing, no crackles. Normal respiratory effort. No accessory muscle use.  Cardiovascular: Regular rate and rhythm, no murmurs / rubs / gallops. Abdomen: non distended, no tenderness. Bowel sounds positive.  Musculoskeletal: no clubbing / cyanosis.  Skin: no rashes Neurologic: Flaccid on left   Data Reviewed: I have independently reviewed following labs and imaging studies    CBC Recent Labs  Lab 07/02/21 2003 07/02/21 2024 07/04/21 0352 07/06/21 0526  WBC 13.5*  --  10.2 16.3*  HGB 13.2 13.9 11.5* 13.5  HCT 40.2 41.0 35.1* 42.3  PLT 353  --  272 277  MCV 84.6  --  82.8 86.5  MCH 27.8  --  27.1 27.6  MCHC 32.8  --  32.8 31.9  RDW 13.7  --  13.7 14.4  LYMPHSABS 3.7  --   --   --   MONOABS 1.0  --   --   --   EOSABS 0.3  --   --   --   BASOSABS 0.1  --   --   --     Recent Labs  Lab 07/02/21 2003 07/02/21 2024 07/04/21 0352 07/06/21 0526 07/09/21 0457  NA 136 137 133* 135 130*  K 4.3 4.2 3.9 3.9 4.2  CL 103 102 102 100 96*  CO2 23  --  24 24 25   GLUCOSE 133* 124* 156* 126* 140*  BUN 12 12 17  25* 22  CREATININE 0.70 0.70 0.53* 0.65 0.66  CALCIUM 9.3  --  8.8* 9.2 8.8*  AST 20  --   --  28  --   ALT 20  --   --  36  --   ALKPHOS 82  --   --  65  --   BILITOT 0.5  --   --  0.8  --   ALBUMIN 3.7  --   --  3.8  --   INR 1.1  --   --   --   --     ------------------------------------------------------------------------------------------------------------------ No results for input(s): CHOL, HDL, LDLCALC, TRIG, CHOLHDL, LDLDIRECT in the last 72 hours.  Lab Results  Component Value Date   HGBA1C 6.2 (H) 05/18/2021   ------------------------------------------------------------------------------------------------------------------ No results for input(s): TSH, T4TOTAL, T3FREE, THYROIDAB in the last 72 hours.  Invalid input(s): FREET3  Cardiac Enzymes No results for input(s): CKMB, TROPONINI, MYOGLOBIN in the last 168 hours.  Invalid input(s): CK ------------------------------------------------------------------------------------------------------------------ No results found for: BNP  CBG: Recent Labs  Lab 07/08/21 0740 07/08/21 1134 07/08/21 1709 07/08/21 2115 07/09/21 0740  GLUCAP 151* 141* 180* 148* 133*    Recent Results (from the past 240 hour(s))  Resp Panel by RT-PCR (Flu A&B, Covid) Nasopharyngeal Swab     Status:  None   Collection Time: 07/02/21  9:17 PM   Specimen: Nasopharyngeal Swab; Nasopharyngeal(NP) swabs in vial transport medium  Result Value Ref Range Status   SARS Coronavirus 2 by RT PCR NEGATIVE NEGATIVE Final    Comment: (NOTE) SARS-CoV-2 target nucleic acids are NOT DETECTED.  The SARS-CoV-2 RNA is generally detectable in upper respiratory specimens during the acute phase of infection. The lowest concentration of SARS-CoV-2 viral copies this assay can detect is 138 copies/mL. A negative result does not preclude SARS-Cov-2 infection and should not be used as the sole basis for treatment or other patient management decisions. A negative result may occur with  improper specimen  collection/handling, submission of specimen other than nasopharyngeal swab, presence of viral mutation(s) within the areas targeted by this assay, and inadequate number of viral copies(<138 copies/mL). A negative result must be combined with clinical observations, patient history, and epidemiological information. The expected result is Negative.  Fact Sheet for Patients:  EntrepreneurPulse.com.au  Fact Sheet for Healthcare Providers:  IncredibleEmployment.be  This test is no t yet approved or cleared by the [place] FDA and  has been authorized for detection and/or diagnosis of SARS-CoV-2 by FDA under an Emergency Use Authorization (EUA). This EUA will remain  in effect (meaning this test can be used) for the duration of the COVID-19 declaration under Section 564(b)(1) of the Act, 21 U.S.C.section 360bbb-3(b)(1), unless the authorization is terminated  or revoked sooner.       Influenza A by PCR NEGATIVE NEGATIVE Final   Influenza B by PCR NEGATIVE NEGATIVE Final    Comment: (NOTE) The Xpert Xpress SARS-CoV-2/FLU/RSV plus assay is intended as an aid in the diagnosis of influenza from Nasopharyngeal swab specimens and should not be used as a sole basis for  treatment. Nasal washings and aspirates are unacceptable for Xpert Xpress SARS-CoV-2/FLU/RSV testing.  Fact Sheet for Patients: EntrepreneurPulse.com.au  Fact Sheet for Healthcare Providers: IncredibleEmployment.be  This test is not yet approved or cleared by the [place] FDA and has been authorized for detection and/or diagnosis of SARS-CoV-2 by FDA under an Emergency Use Authorization (EUA). This EUA will remain in effect (meaning this test can be used) for the duration of the COVID-19 declaration under Section 564(b)(1) of the Act, 21 U.S.C. section 360bbb-3(b)(1), unless the authorization is terminated or revoked.  Performed at [facility], [place] [address] [place], [place] [address]   Resp Panel by RT-PCR (Flu A&B, Covid) Nasopharyngeal Swab     Status: None   Collection Time: 07/06/21  3:44 PM   Specimen: Nasopharyngeal Swab; Nasopharyngeal(NP) swabs in vial transport medium  Result Value Ref Range Status   SARS Coronavirus 2 by RT PCR NEGATIVE NEGATIVE Final    Comment: (NOTE) SARS-CoV-2 target nucleic acids are NOT DETECTED.  The SARS-CoV-2 RNA is generally detectable in upper respiratory specimens during the acute phase of infection. The lowest concentration of SARS-CoV-2 viral copies this assay can detect is 138 copies/mL. A negative result does not preclude SARS-Cov-2 infection and should not be used as the sole basis for treatment or other patient management decisions. A negative result may occur with  improper specimen collection/handling, submission of specimen other than nasopharyngeal swab, presence of viral mutation(s) within the areas targeted by this assay, and inadequate number of viral copies(<138 copies/mL). A negative result must be combined with clinical observations, patient history, and epidemiological information. The expected result is Negative.  Fact Sheet for Patients:   EntrepreneurPulse.com.au  Fact Sheet for Healthcare Providers:  IncredibleEmployment.be  This test is no t yet approved or cleared by the [place] FDA and  has been authorized for detection and/or diagnosis of SARS-CoV-2 by FDA under an Emergency Use Authorization (EUA). This EUA will remain  in effect (meaning this test can be used) for the duration of the COVID-19 declaration under Section 564(b)(1) of the Act, 21 U.S.C.section 360bbb-3(b)(1), unless the authorization is terminated  or revoked sooner.       Influenza A by PCR NEGATIVE NEGATIVE Final   Influenza B by PCR NEGATIVE NEGATIVE Final    Comment: (NOTE) The Xpert Xpress SARS-CoV-2/FLU/RSV plus assay is intended as an aid in the diagnosis  of influenza from Nasopharyngeal swab specimens and should not be used as a sole basis for treatment. Nasal washings and aspirates are unacceptable for Xpert Xpress SARS-CoV-2/FLU/RSV testing.  Fact Sheet for Patients: EntrepreneurPulse.com.au  Fact Sheet for Healthcare Providers: IncredibleEmployment.be  This test is not yet approved or cleared by the [place] FDA and has been authorized for detection and/or diagnosis of SARS-CoV-2 by FDA under an Emergency Use Authorization (EUA). This EUA will remain in effect (meaning this test can be used) for the duration of the COVID-19 declaration under Section 564(b)(1) of the Act, 21 U.S.C. section 360bbb-3(b)(1), unless the authorization is terminated or revoked.  Performed at [facility], [place] [address] [place], [place] [address]      Radiology Studies: No results found.   Marzetta Board, MD, PhD [facility]  Between 7 am - 7 pm I am available, please contact me via Amion (for emergencies) or Securechat (non urgent messages)  Between 7 pm - 7 am I am not available, please contact night coverage MD/APP via Amion

## 2021-07-09 NOTE — Progress Notes (Signed)
Inpatient Rehab Admissions:  Inpatient Rehab Consult received.  I spoke with patient via telephone for rehabilitation assessment and to discuss goals and expectations of an inpatient rehab admission.  Pt acknowledged understanding of CIR goals and expectations. Pt interested in pursuing CIR. Pt informed AC that family lives out of state. Pt gave [facility] name and number of a neighbor, Katharine Look, that could provide intermittent assistance/supervision after discharge. Spoke with Katharine Look on the telephone. She confirmed she can provide intermittent assistance/supervision. Will discuss with physiatrist. Will continue to follow.  Signed: Gayland Curry, [place], [place] Admissions Coordinator [phone]

## 2021-07-10 DIAGNOSIS — C7931 Secondary malignant neoplasm of brain: Principal | ICD-10-CM

## 2021-07-10 DIAGNOSIS — G936 Cerebral edema: Secondary | ICD-10-CM | POA: Diagnosis not present

## 2021-07-10 DIAGNOSIS — Z515 Encounter for palliative care: Secondary | ICD-10-CM | POA: Diagnosis not present

## 2021-07-10 DIAGNOSIS — Z7189 Other specified counseling: Secondary | ICD-10-CM

## 2021-07-10 LAB — GLUCOSE, CAPILLARY
Glucose-Capillary: 129 mg/dL — ABNORMAL HIGH (ref 70–99)
Glucose-Capillary: 154 mg/dL — ABNORMAL HIGH (ref 70–99)
Glucose-Capillary: 135 mg/dL — ABNORMAL HIGH (ref 70–99)
Glucose-Capillary: 185 mg/dL — ABNORMAL HIGH (ref 70–99)

## 2021-07-10 MED ORDER — FAMOTIDINE 20 MG PO TABS
20.0000 mg | ORAL_TABLET | Freq: Every day | ORAL | Status: DC
  Administered 2021-07-10 – 2021-07-19 (×10): 20 mg via ORAL
  Filled 2021-07-10 (×10): qty 1

## 2021-07-10 NOTE — Consult Note (Signed)
Consultation Note Date: 07/10/2021   Patient Name: Stephen Diaz  DOB: [birth year]  MRN: [id number]  Age / Sex: 66 y.o., male  PCP: Stephen Chamberlain, PA-C Referring Physician: Caren Griffins, MD  Reason for Consultation: Establishing goals of care  HPI/Patient Profile: 66 y.o. male  with past medical history of osteoarthritis, lung cancer, COPD, diabetes, hypertension, right foot drop admitted on 07/06/2021 with worsening left-sided weakness and found to have brain metastasis.  Palliative consulted for goals of care.  Clinical Assessment and Goals of Care: I met today with Stephen Diaz.   He tells me that things most important to him are being independent and his daughter who lives in [place].  Reports he is not particularly religious and worked in the past hanging sheet rock.  Enjoys fishing but reports, "no way I can enjoy that now" as he used his right arm to lift his left hand and then let it fall limply back to the bed  We discussed clinical course as well as wishes moving forward in regard to advanced directives.  Concepts specific to code status and rehospitalization discussed.  We discussed difference between a aggressive medical intervention path and a palliative, comfort focused care path.  Values and goals of care important to patient and family were attempted to be elicited.  He tells me that he wants to continue with interventions including pursuing radiation therapy.  He needs the opportunity to talk further with his oncology team (including medical oncology and radiation oncology) about options once further information is known about how he responds to radiation and likely prognosis.  He is clear, however, that he would not want to be placed on life support.  We discussed that in light of multiple chronic medical problems that have worsened with discovery of brain metastasis with neurologic  impairment, care should be focused on interventions that are likely to allow the patient to achieve goal of getting back to home and spending time with family. I discussed with him regarding heroic interventions at the end-of-life and he agrees this would not be in line with his wishes for a natural death or be likely to lead to getting well enough to go back home.  He was in agreement with changing CODE STATUS to DO NOT RESUSCITATE.  Concept of Hospice and Palliative Care were discussed   Questions and concerns addressed.   PMT will continue to support holistically.   SUMMARY OF RECOMMENDATIONS   -DNR/DNI -Otherwise continue full scope of care including plan for rehabilitation and radiation. -He needs further information regarding treatment options and prognosis for his cancer prior to making further decisions. -Attempted to call his daughter but did not reach her.  Left number with my voicemail to return call. -Would recommend palliative care follow as an outpatient when he is discharged.  Code Status/Advance Care Planning: DNR  Prognosis:  Guarded  Discharge Planning: To Be Determined      Primary Diagnoses: Present on Admission:  Chronic obstructive pulmonary disease ([place])  Metastasis to brain ([place])  Dyslipidemia  Essential hypertension  Squamous cell carcinoma of lung, stage I, right (HCC)  Pressure injury of ischium, stage 1  Vasogenic edema (HCC)  Brain metastases ([place])   I have reviewed the medical record, interviewed the patient and family, and examined the patient. The following aspects are pertinent.  Past Medical History:  Diagnosis Date   Arthritis    right shoulder   Cancer (HCC)    COPD (chronic obstructive pulmonary disease) (HCC)    Diabetes mellitus without complication (HCC)    Type II   Hypertension    Right foot drop    from pinched nerve in back, wears brace   Social History   Socioeconomic History   Marital status: Widowed    Spouse name:  Not on file   Number of children: Not on file   Years of education: Not on file   Highest education level: Not on file  Occupational History   Not on file  Tobacco Use   Smoking status: Former    Packs/day: 1.00    Years: 49.00    Pack years: 49.00    Types: Cigarettes    Start date: 44    Quit date: 05/2021    Years since quitting: 0.1   Smokeless tobacco: Never  Vaping Use   Vaping Use: Never used  Substance and Sexual Activity   Alcohol use: Not Currently   Drug use: Not Currently   Sexual activity: Not Currently  Other Topics Concern   Not on file  Social History Narrative   Not on file   Social Determinants of Health   Financial Resource Strain: Not on file  Food Insecurity: Not on file  Transportation Needs: Not on file  Physical Activity: Not on file  Stress: Not on file  Social Connections: Not on file   Family History  Problem Relation Age of Onset   Crohn's disease Cousin    Colon cancer Neg Hx    Scheduled Meds:  dexamethasone  4 mg Oral TID   famotidine  20 mg Oral Daily   fluticasone furoate-vilanterol  1 puff Inhalation Daily   And   umeclidinium bromide  1 puff Inhalation Daily   insulin aspart  0-15 Units Subcutaneous TID WC   levETIRAcetam  500 mg Oral BID   lisinopril  5 mg Oral Daily   metFORMIN  1,000 mg Oral Q breakfast   tamsulosin  0.4 mg Oral QPC breakfast   Continuous Infusions: PRN Meds:.acetaminophen **OR** acetaminophen, albuterol, docusate sodium, fentaNYL (SUBLIMAZE) injection, guaiFENesin-dextromethorphan, LORazepam, ondansetron **OR** ondansetron (ZOFRAN) IV, oxyCODONE Medications Prior to Admission:  Prior to Admission medications   Medication Sig Start Date End Date Taking? Authorizing Provider  albuterol (VENTOLIN HFA) 108 (90 Base) MCG/ACT inhaler Inhale 2 puffs into the lungs every 6 (six) hours as needed for wheezing or shortness of breath.   Yes [provider]  aspirin 81 MG EC tablet Take 81 mg by mouth  daily.   Yes [provider]  dexamethasone (DECADRON) 4 MG tablet Take 1 tablet (4 mg total) by mouth 3 (three) times daily. 07/05/21 10/03/21 Yes [place] Eric J, DO  ibuprofen (ADVIL) 200 MG tablet Take 2 tablets (400 mg total) by mouth every 6 (six) hours as needed for mild pain. 06/02/21  Yes Barrett, Erin R, PA-C  insulin lispro (HUMALOG) 200 UNIT/ML KwikPen Inject 2-10 Units into the skin 3 (three) times daily before meals. Take 2 units for glucose 150-200, 4 units for glucose 201-250, 6 units for  glucose 251-300, 8 units for glucose 201-350 and 10 units for glucose 351-400. 07/05/21  Yes [place] Eric J, DO  lisinopril (ZESTRIL) 10 MG tablet Take 10 mg by mouth daily.   Yes [provider]  metFORMIN (GLUCOPHAGE) 500 MG tablet Take 1,000 mg by mouth daily with breakfast. 05/10/20  Yes [provider]  Multiple Vitamins-Minerals (CENTRUM SILVER 50+MEN) TABS Take 1 tablet by mouth daily.   Yes [provider]  oxyCODONE (OXY IR/ROXICODONE) 5 MG immediate release tablet Take 1 tablet (5 mg total) by mouth every 4 (four) hours as needed for moderate pain. 06/02/21  Yes Barrett, Erin R, PA-C  tamsulosin (FLOMAX) 0.4 MG CAPS capsule Take 1 capsule (0.4 mg total) by mouth daily after breakfast. 07/06/21 10/04/21 Yes [place] Eric J, DO  TRELEGY ELLIPTA 100-62.5-25 MCG/INH AEPB Inhale 1 puff into the lungs daily. 01/01/21  Yes [provider]  Insulin Pen Needle (PEN NEEDLES 3/16") 31G X 5 MM MISC Use as directed with insulin pen 07/05/21   [place] Eric J, DO   No Known Allergies Review of Systems  Constitutional:  Positive for activity change and fatigue.  Musculoskeletal:  Positive for gait problem and myalgias.  Neurological:  Positive for weakness and numbness.  Psychiatric/Behavioral:  Positive for sleep disturbance.    Physical Exam General: Alert, awake, in no acute distress.   HEENT: No bruits, no goiter, no JVD Heart: Regular rate and rhythm. No  murmur appreciated. Lungs: Good air movement, clear Abdomen: Soft, nontender, nondistended, positive bowel sounds.   Ext: No significant edema Skin: Warm and dry Neuro: flacidity left upper and lower extremity   Vital Signs: BP 101/71 (BP Location: Right Arm)    Pulse 77    Temp 97.6 F (36.4 C) (Oral)    Resp 18    Ht 6' 1"  (1.854 m)    Wt 81.3 kg    SpO2 99%    BMI 23.65 kg/m  Pain Scale: 0-10   Pain Score: Asleep   SpO2: SpO2: 99 % O2 Device:SpO2: 99 % O2 Flow Rate: .   IO: Intake/output summary:  Intake/Output Summary (Last 24 hours) at 07/10/2021 2139 Last data filed at 07/10/2021 1700 Gross per 24 hour  Intake 680 ml  Output 1130 ml  Net -450 ml    LBM: Last BM Date: 07/08/20 Baseline Weight: Weight: 79.4 kg Most recent weight: Weight: 81.3 kg     Palliative Assessment/Data:   Flowsheet Rows    Flowsheet Row Most Recent Value  Intake Tab   Referral Department Hospitalist  Unit at Time of Referral Med/Surg Unit  Palliative Care Primary Diagnosis Cancer  Date Notified 07/07/21  Palliative Care Type New Palliative care  Reason for referral Clarify Goals of Care  Date of Admission 07/06/21  Date first seen by Palliative Care 07/10/21  # of days Palliative referral response time 3 Day(s)  # of days IP prior to Palliative referral 1  Clinical Assessment   Palliative Performance Scale Score 50%  Psychosocial & Spiritual Assessment   Palliative Care Outcomes   Patient/Family meeting held? Yes  Who was at the meeting? Patient  Palliative Care Outcomes Clarified goals of care, Completed durable DNR, Changed CPR status       Time In: 1500 Time Out: 1600 Time Total: 60 Greater than 50%  of this time was spent counseling and coordinating care related to the above assessment and plan.  Signed by: Micheline Rough, MD   Please contact Palliative Medicine Team phone at  [phone] for questions and concerns.  For individual provider: See  Shea Evans

## 2021-07-10 NOTE — Progress Notes (Signed)
PROGRESS NOTE  Stephen Diaz:[id number] DOB: [birth year] DOA: 07/06/2021 PCP: [name], PA-C   LOS: 3 days   Brief Narrative / Interim history: 66 year old male with metastatic lung cancer to the brain, COPD, DM 2, HTN who comes into the hospital with worsening left-sided weakness.  Imaging in the ER with an MRI showed frontal lobes metastatic disease with vasogenic edema, no bleeding or midline shift.  Dr. Loleta Books discussed with neurooncology and radiation oncology with plans for urgent radiation therapy likely on Monday.  Subjective / 24h Interval events: He has persistent left-sided weakness  Assessment & Plan: Principal problem Left-sided weakness due to brain metastasis with vasogenic edema-underwent an EEG on admission which was unremarkable for seizures.  He was empirically started on Keppra, Decadron, continue.  I do not see any notes in the computer from radiation oncology or oncology but apparently Dr. Loleta Books discussed shortly after admission and plans are to start radiation therapy on Monday.  Palliative consulted as well  Active problems Stage IV lung cancer-see above  Hypertension-continue lisinopril  DM2-continue metformin, sliding scale.  He is on steroids.  CBGs acceptable as below  CBG (last 3)  Recent Labs    07/09/21 1735 07/09/21 1957 07/10/21 0744  GLUCAP 153* 209* 129*    COPD-no wheezing, stable, continue home medications  BPH-continue tamsulosin  Hyponatremia-likely due to brain mets.  Repeat sodium level tomorrow morning  Scheduled Meds:  dexamethasone  4 mg Oral TID   fluticasone furoate-vilanterol  1 puff Inhalation Daily   And   umeclidinium bromide  1 puff Inhalation Daily   insulin aspart  0-15 Units Subcutaneous TID WC   levETIRAcetam  500 mg Oral BID   lisinopril  5 mg Oral Daily   metFORMIN  1,000 mg Oral Q breakfast   tamsulosin  0.4 mg Oral QPC breakfast   Continuous Infusions: PRN Meds:.acetaminophen **OR**  acetaminophen, albuterol, docusate sodium, fentaNYL (SUBLIMAZE) injection, guaiFENesin-dextromethorphan, LORazepam, ondansetron **OR** ondansetron (ZOFRAN) IV, oxyCODONE  Diet Orders (From admission, onward)     Start     Ordered   07/06/21 1646  Diet Heart Room service appropriate? Yes; Fluid consistency: Thin  Diet effective now       Question Answer Comment  Room service appropriate? Yes   Fluid consistency: Thin      07/06/21 1646            DVT prophylaxis: SCDs Start: 07/06/21 1644   Lab Results  Component Value Date   PLT 277 07/06/2021      Code Status: Full Code  Family Communication: No family at bedside  Status is: Inpatient  Remains inpatient appropriate because: Persistent weakness, urgent treatment required  Level of care: Med-Surg  Consultants:  RadOnc Palliative care  Procedures:  none  Microbiology  none  Antimicrobials: none    Objective: Vitals:   07/09/21 1955 07/09/21 2136 07/10/21 0445 07/10/21 0750  BP: 100/72  101/63   Pulse: 79  (!) 57   Resp: 18  18   Temp: 98.6 F (37 C)  97.6 F (36.4 C)   TempSrc: Oral  Oral   SpO2: 97% 97% 98% 98%  Weight:      Height:        Intake/Output Summary (Last 24 hours) at 07/10/2021 1023 Last data filed at 07/10/2021 0900 Gross per 24 hour  Intake 680 ml  Output 945 ml  Net -265 ml    Wt Readings from Last 3 Encounters:  07/06/21 81.3 kg  07/05/21 83.7  kg  06/24/21 86.5 kg    Examination:  Constitutional: NAD Eyes: lids and conjunctivae normal, no scleral icterus ENMT: mmm Neck: normal, supple Respiratory: clear to auscultation bilaterally, no wheezing, no crackles. Normal respiratory effort.  Cardiovascular: Regular rate and rhythm, no murmurs / rubs / gallops. No LE edema. Abdomen: soft, no distention, no tenderness. Bowel sounds positive.  Skin: no rashes Neurologic: Flaccid on left.  No new focal deficits   Data Reviewed: I have independently reviewed following labs  and imaging studies   CBC Recent Labs  Lab 07/04/21 0352 07/06/21 0526  WBC 10.2 16.3*  HGB 11.5* 13.5  HCT 35.1* 42.3  PLT 272 277  MCV 82.8 86.5  MCH 27.1 27.6  MCHC 32.8 31.9  RDW 13.7 14.4     Recent Labs  Lab 07/04/21 0352 07/06/21 0526 07/09/21 0457  NA 133* 135 130*  K 3.9 3.9 4.2  CL 102 100 96*  CO2 24 24 25   GLUCOSE 156* 126* 140*  BUN 17 25* 22  CREATININE 0.53* 0.65 0.66  CALCIUM 8.8* 9.2 8.8*  AST  --  28  --   ALT  --  36  --   ALKPHOS  --  65  --   BILITOT  --  0.8  --   ALBUMIN  --  3.8  --      ------------------------------------------------------------------------------------------------------------------ No results for input(s): CHOL, HDL, LDLCALC, TRIG, CHOLHDL, LDLDIRECT in the last 72 hours.  Lab Results  Component Value Date   HGBA1C 6.2 (H) 05/18/2021   ------------------------------------------------------------------------------------------------------------------ No results for input(s): TSH, T4TOTAL, T3FREE, THYROIDAB in the last 72 hours.  Invalid input(s): FREET3  Cardiac Enzymes No results for input(s): CKMB, TROPONINI, MYOGLOBIN in the last 168 hours.  Invalid input(s): CK ------------------------------------------------------------------------------------------------------------------ No results found for: BNP  CBG: Recent Labs  Lab 07/09/21 0740 07/09/21 1147 07/09/21 1735 07/09/21 1957 07/10/21 0744  GLUCAP 133* 145* 153* 209* 129*     Recent Results (from the past 240 hour(s))  Resp Panel by RT-PCR (Flu A&B, Covid) Nasopharyngeal Swab     Status: None   Collection Time: 07/02/21  9:17 PM   Specimen: Nasopharyngeal Swab; Nasopharyngeal(NP) swabs in vial transport medium  Result Value Ref Range Status   SARS Coronavirus 2 by RT PCR NEGATIVE NEGATIVE Final    Comment: (NOTE) SARS-CoV-2 target nucleic acids are NOT DETECTED.  The SARS-CoV-2 RNA is generally detectable in upper respiratory specimens during  the acute phase of infection. The lowest concentration of SARS-CoV-2 viral copies this assay can detect is 138 copies/mL. A negative result does not preclude SARS-Cov-2 infection and should not be used as the sole basis for treatment or other patient management decisions. A negative result may occur with  improper specimen collection/handling, submission of specimen other than nasopharyngeal swab, presence of viral mutation(s) within the areas targeted by this assay, and inadequate number of viral copies(<138 copies/mL). A negative result must be combined with clinical observations, patient history, and epidemiological information. The expected result is Negative.  Fact Sheet for Patients:  EntrepreneurPulse.com.au  Fact Sheet for Healthcare Providers:  IncredibleEmployment.be  This test is no t yet approved or cleared by the [place] FDA and  has been authorized for detection and/or diagnosis of SARS-CoV-2 by FDA under an Emergency Use Authorization (EUA). This EUA will remain  in effect (meaning this test can be used) for the duration of the COVID-19 declaration under Section 564(b)(1) of the Act, 21 U.S.C.section 360bbb-3(b)(1), unless the authorization is terminated  or revoked sooner.       Influenza A by PCR NEGATIVE NEGATIVE Final   Influenza B by PCR NEGATIVE NEGATIVE Final    Comment: (NOTE) The Xpert Xpress SARS-CoV-2/FLU/RSV plus assay is intended as an aid in the diagnosis of influenza from Nasopharyngeal swab specimens and should not be used as a sole basis for treatment. Nasal washings and aspirates are unacceptable for Xpert Xpress SARS-CoV-2/FLU/RSV testing.  Fact Sheet for Patients: EntrepreneurPulse.com.au  Fact Sheet for Healthcare Providers: IncredibleEmployment.be  This test is not yet approved or cleared by the [place] FDA and has been authorized for detection and/or  diagnosis of SARS-CoV-2 by FDA under an Emergency Use Authorization (EUA). This EUA will remain in effect (meaning this test can be used) for the duration of the COVID-19 declaration under Section 564(b)(1) of the Act, 21 U.S.C. section 360bbb-3(b)(1), unless the authorization is terminated or revoked.  Performed at [facility], [place] [address] [place], [place] [address]   Resp Panel by RT-PCR (Flu A&B, Covid) Nasopharyngeal Swab     Status: None   Collection Time: 07/06/21  3:44 PM   Specimen: Nasopharyngeal Swab; Nasopharyngeal(NP) swabs in vial transport medium  Result Value Ref Range Status   SARS Coronavirus 2 by RT PCR NEGATIVE NEGATIVE Final    Comment: (NOTE) SARS-CoV-2 target nucleic acids are NOT DETECTED.  The SARS-CoV-2 RNA is generally detectable in upper respiratory specimens during the acute phase of infection. The lowest concentration of SARS-CoV-2 viral copies this assay can detect is 138 copies/mL. A negative result does not preclude SARS-Cov-2 infection and should not be used as the sole basis for treatment or other patient management decisions. A negative result may occur with  improper specimen collection/handling, submission of specimen other than nasopharyngeal swab, presence of viral mutation(s) within the areas targeted by this assay, and inadequate number of viral copies(<138 copies/mL). A negative result must be combined with clinical observations, patient history, and epidemiological information. The expected result is Negative.  Fact Sheet for Patients:  EntrepreneurPulse.com.au  Fact Sheet for Healthcare Providers:  IncredibleEmployment.be  This test is no t yet approved or cleared by the [place] FDA and  has been authorized for detection and/or diagnosis of SARS-CoV-2 by FDA under an Emergency Use Authorization (EUA). This EUA will remain  in effect (meaning this test can be used) for the duration  of the COVID-19 declaration under Section 564(b)(1) of the Act, 21 U.S.C.section 360bbb-3(b)(1), unless the authorization is terminated  or revoked sooner.       Influenza A by PCR NEGATIVE NEGATIVE Final   Influenza B by PCR NEGATIVE NEGATIVE Final    Comment: (NOTE) The Xpert Xpress SARS-CoV-2/FLU/RSV plus assay is intended as an aid in the diagnosis of influenza from Nasopharyngeal swab specimens and should not be used as a sole basis for treatment. Nasal washings and aspirates are unacceptable for Xpert Xpress SARS-CoV-2/FLU/RSV testing.  Fact Sheet for Patients: EntrepreneurPulse.com.au  Fact Sheet for Healthcare Providers: IncredibleEmployment.be  This test is not yet approved or cleared by the [place] FDA and has been authorized for detection and/or diagnosis of SARS-CoV-2 by FDA under an Emergency Use Authorization (EUA). This EUA will remain in effect (meaning this test can be used) for the duration of the COVID-19 declaration under Section 564(b)(1) of the Act, 21 U.S.C. section 360bbb-3(b)(1), unless the authorization is terminated or revoked.  Performed at [facility], [place] [address] [place], [place] [address]  Radiology Studies: No results found.   Marzetta Board, MD, PhD [facility]  Between 7 am - 7 pm I am available, please contact me via Amion (for emergencies) or Securechat (non urgent messages)  Between 7 pm - 7 am I am not available, please contact night coverage MD/APP via Amion

## 2021-07-10 NOTE — Progress Notes (Signed)
Occupational Therapy Treatment Patient Details Name: Stephen Diaz MRN: [id number] DOB: [birth year] Today's Date: 07/10/2021   History of present illness Pt is a 66 y.o. male returning to [facility] on 1/25 after discharge on 1/24 for recent admission for progressively worsening Lt sided weakness. CT scan of brain significant for 3 masses (Rt and Lt) up to 3 cm in diameter with vasogenic edema present mass effect on the lateral ventricles, but no midline shift. CT and MRI in ED negative for acute intracranial findings such as CVA. 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   OT comments  Treatment focused on instructing patient on positioning of upper and lower extremity while in bed to maintain ROM and reduce likelihood of contractures and  PROM exercises. Patient's UE presents near the same as on evaluation - predominantly flaccid with external rotation spasticity - approx 1+ on the modified ashworth scale. Patient's finger extension improved as patient reports working on it. Patient educated on PROM exercises to perform to maintain ROM and also cautioned to not go above 90 degrees to lack of scapular movement. Patient's left hip externally rotated and knee flexed with his leg underneath his right leg. Therapist straightened leg and positioned on a pillow to float heel and encouraged patient maintain position.Therapist provided stretch to promote knee extension and ankle dorsiflexion and educated patient on why.   Patient asked therapist very pointed questions in regards to recovery of his arm and left side which therapist answered honestly which consisted of there is hope for recovery with the combination of radiation and therapy but there is a chance of continued weakness and functional loss. Patient seemed to except those answers well. He is hopeful for aggressive therapy. He  did request a sling and therapist requested an order from MD.   Recommendations for follow up therapy are one component of a multi-disciplinary discharge planning process, led by the attending physician.  Recommendations may be updated based on patient status, additional functional criteria and insurance authorization.    Follow Up Recommendations  Acute inpatient rehab (3hours/day)    Assistance Recommended at Discharge Frequent or constant Supervision/Assistance  Patient can return home with the following  A lot of help with walking and/or transfers;A lot of help with bathing/dressing/bathroom;Assistance with cooking/housework   Equipment Recommendations  BSC/3in1;Tub/shower bench    Recommendations for Other Services      Precautions / Restrictions Precautions Precautions: Fall Precaution Comments: 6 falls in last month at least, pt re-admitted after being home for <1 hour due to fall            Extremity/Trunk Assessment               Cognition Arousal/Alertness: Awake/alert Behavior During Therapy: WFL for tasks assessed/performed Overall Cognitive Status: Within Functional Limits for tasks assessed                                                     Pertinent Vitals/ Pain       Pain Assessment Pain Assessment: No/denies pain   Frequency  Min 3X/week        Progress Toward Goals  OT Goals(current goals can now be found in the care plan section)  Progress towards OT goals: Progressing  toward goals  Acute Rehab OT Goals Patient Stated Goal: to live on his own OT Goal Formulation: With patient Time For Goal Achievement: 07/21/21 Potential to Achieve Goals: Good  Plan Discharge plan remains appropriate       AM-PAC OT "6 Clicks" Daily Activity     Outcome Measure   Help from another person eating meals?: A Little Help from another person taking care of personal grooming?: A Little Help from another person toileting, which  includes using toliet, bedpan, or urinal?: A Lot Help from another person bathing (including washing, rinsing, drying)?: A Lot Help from another person to put on and taking off regular upper body clothing?: A Lot Help from another person to put on and taking off regular lower body clothing?: A Lot 6 Click Score: 14    End of Session    OT Visit Diagnosis: History of falling (Z91.81);Unsteadiness on feet (R26.81);Hemiplegia and hemiparesis Hemiplegia - Right/Left: Left Hemiplegia - dominant/non-dominant: Non-Dominant Hemiplegia - caused by: Unspecified   Activity Tolerance Patient tolerated treatment well   Patient Left in bed;with call bell/phone within reach;with bed alarm set   Nurse Communication          Time: 7062-3762 OT Time Calculation (min): 20 min  Charges: OT General Charges $OT Visit: 1 Visit OT Treatments $Therapeutic Exercise: 8-22 mins  Derl Barrow, OTR/L [place]  Office [phone] Pager: [place] 07/10/2021, 3:33 PM

## 2021-07-10 NOTE — Progress Notes (Signed)
Orthopedic Tech Progress Note Patient Details:  Stephen Diaz 1955/08/24 [id number]  Ortho Devices Type of Ortho Device: Shoulder immobilizer Ortho Device/Splint Location: left Ortho Device/Splint Interventions: Application   Post Interventions Patient Tolerated: Well Instructions Provided: Care of device  [name] 07/10/2021, 5:58 PM

## 2021-07-11 DIAGNOSIS — G936 Cerebral edema: Secondary | ICD-10-CM | POA: Diagnosis not present

## 2021-07-11 LAB — GLUCOSE, CAPILLARY
Glucose-Capillary: 185 mg/dL — ABNORMAL HIGH (ref 70–99)
Glucose-Capillary: 177 mg/dL — ABNORMAL HIGH (ref 70–99)
Glucose-Capillary: 171 mg/dL — ABNORMAL HIGH (ref 70–99)
Glucose-Capillary: 229 mg/dL — ABNORMAL HIGH (ref 70–99)

## 2021-07-11 LAB — BASIC METABOLIC PANEL
Anion gap: 10 (ref 5–15)
BUN: 25 mg/dL — ABNORMAL HIGH (ref 8–23)
CO2: 24 mmol/L (ref 22–32)
Calcium: 9 mg/dL (ref 8.9–10.3)
Chloride: 95 mmol/L — ABNORMAL LOW (ref 98–111)
Creatinine, Ser: 0.56 mg/dL — ABNORMAL LOW (ref 0.61–1.24)
GFR, Estimated: >60 mL/min (ref >60)
Glucose, Bld: 140 mg/dL — ABNORMAL HIGH (ref 70–99)
Potassium: 4.3 mmol/L (ref 3.5–5.1)
Sodium: 129 mmol/L — ABNORMAL LOW (ref 135–145)

## 2021-07-11 LAB — CBC
HCT: 40.9 % (ref 39.0–52.0)
Hemoglobin: 13.3 g/dL (ref 13.0–17.0)
MCH: 27.6 pg (ref 26.0–34.0)
MCHC: 32.5 g/dL (ref 30.0–36.0)
MCV: 84.9 fL (ref 80.0–100.0)
Platelets: 298 10*3/uL (ref 150–400)
RBC: 4.82 MIL/uL (ref 4.22–5.81)
RDW: 14.4 % (ref 11.5–15.5)
WBC: 14.1 10*3/uL — ABNORMAL HIGH (ref 4.0–10.5)
nRBC: 0 % (ref 0.0–0.2)

## 2021-07-11 NOTE — TOC Progression Note (Signed)
Transition of Care [facility]) - Progression Note    Patient Details  Name: Stephen Diaz MRN: [id number] Date of Birth: Mar 31, 1956  Transition of Care [facility]) CM/SW Contact  Leeroy Cha, RN Phone Number: 07/11/2021, 9:00 AM  Clinical Narrative:    Following for toc needs and cir needs for possible admission.     Barriers to Discharge: No Barriers Identified  Expected Discharge Plan and Services                                                 Social Determinants of Health (SDOH) Interventions    Readmission Risk Interventions No flowsheet data found.

## 2021-07-11 NOTE — Progress Notes (Signed)
PROGRESS NOTE    Stephen Diaz  LEX:[id number] DOB: Nov 12, 1955 DOA: 07/06/2021 PCP: [name], PA-C    Brief Narrative:  This 66 year old male with metastatic lung cancer to the brain, COPD, DM 2, HTN who comes into the hospital with worsening left-sided weakness.  Imaging in the ER with an MRI showed frontal lobes metastatic disease with vasogenic edema, no bleeding or midline shift.  Dr. Loleta Books discussed with neurooncology and radiation oncology with plans for urgent radiation therapy likely on Monday 07/11/21.  Assessment & Plan:   Principal Problem:   Vasogenic edema (HCC) Active Problems:   Chronic obstructive pulmonary disease (HCC)   Type 2 diabetes mellitus (HCC)   Squamous cell carcinoma of lung, stage I, right (HCC)   Metastasis to brain [facility])   Essential hypertension   Dyslipidemia   Acute left-sided weakness   Pressure injury of ischium, stage 1   Brain metastases (HCC)  Left sided weakness due to brain metastasis with vasogenic edema: Patient presented with worsening left-sided weakness. He underwent EEG on admission which was unremarkable for seizures. He was empirically started on Keppra and Decadron. Continue Keppra and Decadron. Dr. Loleta Books discussed shortly after admission and plans are to start radiation therapy on 07/11/2021. Palliative care  discussed goals of care.  Stage IV lung cancer: Continue radiation treatment starting today. Palliative care consulted to discuss goals of care. Patient is interested in starting radiation treatment  Essential hypertension: Continue lisinopril.  Type 2 diabetes: Continue metformin, sliding scale. Continue steroids. Monitor CBG  COPD: Stable.  Continue home inhalers.  BPH: Continue tamsulosin  Hyponatremia Likely due to brain mets. Follow-up a.m. labs tomorrow  DVT prophylaxis:  SCDs Code Status: DNR Family Communication:  No family at bed side. Disposition Plan:   Status is:  Inpatient  Remains inpatient appropriate because: Persistent left-sided weakness, urgent radiation therapy advised.  Consultants:  Radiation therapy, Palliative care  Procedures:  MRI Antimicrobials:  Anti-infectives (From admission, onward)    None       Subjective: Patient was seen and examined at bedside.  Overnight events noted.   Patient reports feeling very weak on left side.  Not able to move left arm but able to move left leg.   He reports starting radiation treatment today.  Objective: Vitals:   07/10/21 2200 07/11/21 0542 07/11/21 0758 07/11/21 1146  BP: 106/76 102/69  110/82  Pulse: 68 (!) 56    Resp: 20 20    Temp: 97.7 F (36.5 C) 97.6 F (36.4 C)    TempSrc: Oral Oral    SpO2: 97% 97% 97%   Weight:      Height:        Intake/Output Summary (Last 24 hours) at 07/11/2021 1311 Last data filed at 07/11/2021 0900 Gross per 24 hour  Intake 360 ml  Output 1130 ml  Net -770 ml   Filed Weights   07/06/21 0356 07/06/21 2045  Weight: 79.4 kg 81.3 kg    Examination:  General exam: Appears comfortable, not in any acute distress.  Deconditioned. Respiratory system: Clear to auscultation bilaterally, respiratory effort normal, RR 15. Cardiovascular system: S1 & S2 heard, regular rate and rhythm, no murmur. Gastrointestinal system: Abdomen is soft, nontender, nondistended, BS+ Central nervous system: Alert and oriented x 3.  Left-sided weakness. Extremities: No edema, no cyanosis, no clubbing. Skin: No rashes, lesions or ulcers Psychiatry: Judgement and insight appear normal. Mood & affect appropriate.     Data Reviewed: I have personally reviewed following labs  and imaging studies  CBC: Recent Labs  Lab 07/06/21 0526 07/11/21 0435  WBC 16.3* 14.1*  HGB 13.5 13.3  HCT 42.3 40.9  MCV 86.5 84.9  PLT 277 102   Basic Metabolic Panel: Recent Labs  Lab 07/06/21 0526 07/09/21 0457 07/11/21 0435  NA 135 130* 129*  K 3.9 4.2 4.3  CL 100 96* 95*   CO2 24 25 24   GLUCOSE 126* 140* 140*  BUN 25* 22 25*  CREATININE 0.65 0.66 0.56*  CALCIUM 9.2 8.8* 9.0   GFR: Estimated Creatinine Clearance: 102.6 mL/min (A) (by C-G formula based on SCr of 0.56 mg/dL (L)). Liver Function Tests: Recent Labs  Lab 07/06/21 0526  AST 28  ALT 36  ALKPHOS 65  BILITOT 0.8  PROT 7.4  ALBUMIN 3.8   No results for input(s): LIPASE, AMYLASE in the last 168 hours. No results for input(s): AMMONIA in the last 168 hours. Coagulation Profile: No results for input(s): INR, PROTIME in the last 168 hours. Cardiac Enzymes: No results for input(s): CKTOTAL, CKMB, CKMBINDEX, TROPONINI in the last 168 hours. BNP (last 3 results) No results for input(s): PROBNP in the last 8760 hours. HbA1C: No results for input(s): HGBA1C in the last 72 hours. CBG: Recent Labs  Lab 07/10/21 1202 07/10/21 1653 07/10/21 2157 07/11/21 0718 07/11/21 1131  GLUCAP 154* 135* 185* 185* 177*   Lipid Profile: No results for input(s): CHOL, HDL, LDLCALC, TRIG, CHOLHDL, LDLDIRECT in the last 72 hours. Thyroid Function Tests: No results for input(s): TSH, T4TOTAL, FREET4, T3FREE, THYROIDAB in the last 72 hours. Anemia Panel: No results for input(s): VITAMINB12, FOLATE, FERRITIN, TIBC, IRON, RETICCTPCT in the last 72 hours. Sepsis Labs: No results for input(s): PROCALCITON, LATICACIDVEN in the last 168 hours.  Recent Results (from the past 240 hour(s))  Resp Panel by RT-PCR (Flu A&B, Covid) Nasopharyngeal Swab     Status: None   Collection Time: 07/02/21  9:17 PM   Specimen: Nasopharyngeal Swab; Nasopharyngeal(NP) swabs in vial transport medium  Result Value Ref Range Status   SARS Coronavirus 2 by RT PCR NEGATIVE NEGATIVE Final    Comment: (NOTE) SARS-CoV-2 target nucleic acids are NOT DETECTED.  The SARS-CoV-2 RNA is generally detectable in upper respiratory specimens during the acute phase of infection. The lowest concentration of SARS-CoV-2 viral copies this assay can  detect is 138 copies/mL. A negative result does not preclude SARS-Cov-2 infection and should not be used as the sole basis for treatment or other patient management decisions. A negative result may occur with  improper specimen collection/handling, submission of specimen other than nasopharyngeal swab, presence of viral mutation(s) within the areas targeted by this assay, and inadequate number of viral copies(<138 copies/mL). A negative result must be combined with clinical observations, patient history, and epidemiological information. The expected result is Negative.  Fact Sheet for Patients:  EntrepreneurPulse.com.au  Fact Sheet for Healthcare Providers:  IncredibleEmployment.be  This test is no t yet approved or cleared by the [place] FDA and  has been authorized for detection and/or diagnosis of SARS-CoV-2 by FDA under an Emergency Use Authorization (EUA). This EUA will remain  in effect (meaning this test can be used) for the duration of the COVID-19 declaration under Section 564(b)(1) of the Act, 21 U.S.C.section 360bbb-3(b)(1), unless the authorization is terminated  or revoked sooner.       Influenza A by PCR NEGATIVE NEGATIVE Final   Influenza B by PCR NEGATIVE NEGATIVE Final    Comment: (NOTE) The Xpert Xpress SARS-CoV-2/FLU/RSV plus  assay is intended as an aid in the diagnosis of influenza from Nasopharyngeal swab specimens and should not be used as a sole basis for treatment. Nasal washings and aspirates are unacceptable for Xpert Xpress SARS-CoV-2/FLU/RSV testing.  Fact Sheet for Patients: EntrepreneurPulse.com.au  Fact Sheet for Healthcare Providers: IncredibleEmployment.be  This test is not yet approved or cleared by the [place] FDA and has been authorized for detection and/or diagnosis of SARS-CoV-2 by FDA under an Emergency Use Authorization (EUA). This EUA will remain in  effect (meaning this test can be used) for the duration of the COVID-19 declaration under Section 564(b)(1) of the Act, 21 U.S.C. section 360bbb-3(b)(1), unless the authorization is terminated or revoked.  Performed at [facility], [place] [address], [place], [place] [address]   Resp Panel by RT-PCR (Flu A&B, Covid) Nasopharyngeal Swab     Status: None   Collection Time: 07/06/21  3:44 PM   Specimen: Nasopharyngeal Swab; Nasopharyngeal(NP) swabs in vial transport medium  Result Value Ref Range Status   SARS Coronavirus 2 by RT PCR NEGATIVE NEGATIVE Final    Comment: (NOTE) SARS-CoV-2 target nucleic acids are NOT DETECTED.  The SARS-CoV-2 RNA is generally detectable in upper respiratory specimens during the acute phase of infection. The lowest concentration of SARS-CoV-2 viral copies this assay can detect is 138 copies/mL. A negative result does not preclude SARS-Cov-2 infection and should not be used as the sole basis for treatment or other patient management decisions. A negative result may occur with  improper specimen collection/handling, submission of specimen other than nasopharyngeal swab, presence of viral mutation(s) within the areas targeted by this assay, and inadequate number of viral copies(<138 copies/mL). A negative result must be combined with clinical observations, patient history, and epidemiological information. The expected result is Negative.  Fact Sheet for Patients:  EntrepreneurPulse.com.au  Fact Sheet for Healthcare Providers:  IncredibleEmployment.be  This test is no t yet approved or cleared by the [place] FDA and  has been authorized for detection and/or diagnosis of SARS-CoV-2 by FDA under an Emergency Use Authorization (EUA). This EUA will remain  in effect (meaning this test can be used) for the duration of the COVID-19 declaration under Section 564(b)(1) of the Act, 21 U.S.C.section 360bbb-3(b)(1),  unless the authorization is terminated  or revoked sooner.       Influenza A by PCR NEGATIVE NEGATIVE Final   Influenza B by PCR NEGATIVE NEGATIVE Final    Comment: (NOTE) The Xpert Xpress SARS-CoV-2/FLU/RSV plus assay is intended as an aid in the diagnosis of influenza from Nasopharyngeal swab specimens and should not be used as a sole basis for treatment. Nasal washings and aspirates are unacceptable for Xpert Xpress SARS-CoV-2/FLU/RSV testing.  Fact Sheet for Patients: EntrepreneurPulse.com.au  Fact Sheet for Healthcare Providers: IncredibleEmployment.be  This test is not yet approved or cleared by the [place] FDA and has been authorized for detection and/or diagnosis of SARS-CoV-2 by FDA under an Emergency Use Authorization (EUA). This EUA will remain in effect (meaning this test can be used) for the duration of the COVID-19 declaration under Section 564(b)(1) of the Act, 21 U.S.C. section 360bbb-3(b)(1), unless the authorization is terminated or revoked.  Performed at [facility], [place] [address] [place], [place] [address]    Radiology Studies: No results found.  Scheduled Meds:  dexamethasone  4 mg Oral TID   famotidine  20 mg Oral Daily   fluticasone furoate-vilanterol  1 puff Inhalation Daily   And   umeclidinium  bromide  1 puff Inhalation Daily   insulin aspart  0-15 Units Subcutaneous TID WC   levETIRAcetam  500 mg Oral BID   lisinopril  5 mg Oral Daily   metFORMIN  1,000 mg Oral Q breakfast   tamsulosin  0.4 mg Oral QPC breakfast   Continuous Infusions:   LOS: 4 days    Time spent: 50 MINS.    Shawna Clamp, MD [facility]   If 7PM-7AM, please contact night-coverage

## 2021-07-11 NOTE — Progress Notes (Signed)
Inpatient Rehab Admissions Coordinator:  Spoke with pt's daughter, Sharyn Lull regarding CIR goals and expectations. She acknowledged that there is no confirmed dispo for pt. She said she would talk to pt and family to determine if one could be established.  Also spoke with pt to inform him of the conversation with his daughter. He is very eager to begin therapy. Explained that a definitive dispo has to be established before that can happen. Will continue to follow.   Gayland Curry, [place], [place] Admissions Coordinator [phone]

## 2021-07-11 NOTE — Progress Notes (Signed)
The patient's case was reviewed in brain oncology conference this am. He has 3 brain metastases, two larger lesions in the right and left frontal lobes, the right frontal disease most likely driving the patient's weakness, and a 3 mm lesion in the left precentral gyrus. Dr. Christella Noa does not feel that surgical resection would significantly aid in quality of life, and that with surgery, he would still likely have some residual weakness. Rather, plans are to proceed with stereotactic radiosurgery (Fractionated to the two larger, and single fraction treatment to the 3 mm lesion). He is going to proceed with treatment tomorrow, and Dr. Mickeal Skinner will also see the patient to evaluate his weakness and help guide next steps for care as he will most likely have residual weakness to some degree even with steroids, and treatment. He will also need sooner follow up with Dr. Julien Nordmann to consider discussion of systemic therapy.     Carola Rhine, PAC

## 2021-07-11 NOTE — Progress Notes (Signed)
Physical Therapy Treatment Patient Details Name: Stephen Diaz MRN: [id number] DOB: 1955-09-24 Today's Date: 07/11/2021   History of Present Illness Pt is a 66 y.o. male returning to [facility] on 1/25 after discharge on 1/24 for recent admission for progressively worsening Lt sided weakness. CT scan of brain significant for 3 masses (Rt and Lt) up to 3 cm in diameter with vasogenic edema present mass effect on the lateral ventricles, but no midline shift. CT and MRI in ED negative for acute intracranial findings such as CVA. 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    PT Comments    Pt assisted with sit to stands and standing balance.  Pt unable to lift or advance Lt LE or weight bear with control on left side so also practiced lateral scoot transfers today.  Pt very pleasant and eager to participate.  Continue to recommend AIR.    Recommendations for follow up therapy are one component of a multi-disciplinary discharge planning process, led by the attending physician.  Recommendations may be updated based on patient status, additional functional criteria and insurance authorization.  Follow Up Recommendations  Acute inpatient rehab (3hours/day)     Assistance Recommended at Discharge Frequent or constant Supervision/Assistance  Patient can return home with the following Two people to help with walking and/or transfers;Assistance with cooking/housework;Assist for transportation;Help with stairs or ramp for entrance;Two people to help with bathing/dressing/bathroom   Equipment Recommendations  Wheelchair (measurements PT);Wheelchair cushion (measurements PT)    Recommendations for Other Services       Precautions / Restrictions Precautions Precautions: Fall Precaution Comments: 6 falls in last month at least, pt re-admitted after being home for <1 hour due to  fall     Mobility  Bed Mobility Overal bed mobility: Needs Assistance Bed Mobility: Supine to Sit     Supine to sit: Min assist     General bed mobility comments: assist for trunk control once sitting upright, pt self also attempted to self assist Lt LE with Rt UE    Transfers Overall transfer level: Needs assistance Equipment used: Rolling walker (2 wheels) (utilized right side of walker and therapist held down left) Transfers: Sit to/from Stand, Bed to chair/wheelchair/BSC Sit to Stand: Mod assist, +2 safety/equipment          Lateral/Scoot Transfers: Max assist, +2 safety/equipment General transfer comment: assist to rise and steady, Lt LE performs extensor pattern and pt utilizes leaning lower legs against bed to self assist with balance, so had pt take small step away from bed; performed x2 for strengthening and technique; pt unable to lift or advance Lt LE; pt also started practicing lateral scoot transfers to/from bed with drop arm recliner; pt requiring mod assist to right and max assist to transfer left; pt performed to right once more to remain OOB in recliner end of session    Ambulation/Gait                   Stairs             Wheelchair Mobility    Modified Rankin (Stroke Patients Only)       Balance Overall balance assessment: Needs assistance Sitting-balance support: No upper extremity supported, Feet supported Sitting balance-Leahy Scale: Fair     Standing balance support: Single extremity supported Standing balance-Leahy Scale: Poor Standing balance comment: reliant on UE support, requires assist at first  however able to statically stand with Rt UE support                            Cognition Arousal/Alertness: Awake/alert Behavior During Therapy: WFL for tasks assessed/performed Overall Cognitive Status: Within Functional Limits for tasks assessed                                          Exercises       General Comments        Pertinent Vitals/Pain      Home Living                          Prior Function            PT Goals (current goals can now be found in the care plan section) Progress towards PT goals: Progressing toward goals    Frequency    Min 3X/week      PT Plan Current plan remains appropriate;Equipment recommendations need to be updated    Co-evaluation              AM-PAC PT "6 Clicks" Mobility   Outcome Measure  Help needed turning from your back to your side while in a flat bed without using bedrails?: A Lot Help needed moving from lying on your back to sitting on the side of a flat bed without using bedrails?: A Lot Help needed moving to and from a bed to a chair (including a wheelchair)?: A Lot Help needed standing up from a chair using your arms (e.g., wheelchair or bedside chair)?: A Lot Help needed to walk in hospital room?: Total Help needed climbing 3-5 steps with a railing? : Total 6 Click Score: 10    End of Session Equipment Utilized During Treatment: Gait belt Activity Tolerance: Patient tolerated treatment well Patient left: with call bell/phone within reach;in chair;with chair alarm set Nurse Communication: Mobility status PT Visit Diagnosis: Difficulty in walking, not elsewhere classified (R26.2);Unsteadiness on feet (R26.81);Hemiplegia and hemiparesis Hemiplegia - Right/Left: Left Hemiplegia - dominant/non-dominant: Non-dominant Hemiplegia - caused by:  (brain mets)     Time: 7591-6384 PT Time Calculation (min) (ACUTE ONLY): 20 min  Stephen Diaz PT, DPT Acute Rehabilitation Services Pager: [phone] Office: [phone]    Stephen Diaz 07/11/2021, 4:57 PM

## 2021-07-12 ENCOUNTER — Ambulatory Visit
Admission: RE | Admit: 2021-07-12 | Discharge: 2021-07-12 | Disposition: A | Discharge status: Home / Self Care | Payer: Medicare Other | Source: Ambulatory Visit | Attending: Radiation Oncology | Admitting: Radiation Oncology

## 2021-07-12 DIAGNOSIS — G936 Cerebral edema: Secondary | ICD-10-CM | POA: Diagnosis not present

## 2021-07-12 LAB — CBC
HCT: 41.3 % (ref 39.0–52.0)
Hemoglobin: 13.9 g/dL (ref 13.0–17.0)
MCH: 28.3 pg (ref 26.0–34.0)
MCHC: 33.7 g/dL (ref 30.0–36.0)
MCV: 83.9 fL (ref 80.0–100.0)
Platelets: 317 10*3/uL (ref 150–400)
RBC: 4.92 MIL/uL (ref 4.22–5.81)
RDW: 14.4 % (ref 11.5–15.5)
WBC: 15.3 10*3/uL — ABNORMAL HIGH (ref 4.0–10.5)
nRBC: 0 % (ref 0.0–0.2)

## 2021-07-12 LAB — BASIC METABOLIC PANEL
Anion gap: 11 (ref 5–15)
BUN: 22 mg/dL (ref 8–23)
CO2: 22 mmol/L (ref 22–32)
Calcium: 8.7 mg/dL — ABNORMAL LOW (ref 8.9–10.3)
Chloride: 94 mmol/L — ABNORMAL LOW (ref 98–111)
Creatinine, Ser: 0.55 mg/dL — ABNORMAL LOW (ref 0.61–1.24)
GFR, Estimated: >60 mL/min (ref >60)
Glucose, Bld: 161 mg/dL — ABNORMAL HIGH (ref 70–99)
Potassium: 4.8 mmol/L (ref 3.5–5.1)
Sodium: 127 mmol/L — ABNORMAL LOW (ref 135–145)

## 2021-07-12 LAB — GLUCOSE, CAPILLARY
Glucose-Capillary: 145 mg/dL — ABNORMAL HIGH (ref 70–99)
Glucose-Capillary: 268 mg/dL — ABNORMAL HIGH (ref 70–99)
Glucose-Capillary: 102 mg/dL — ABNORMAL HIGH (ref 70–99)
Glucose-Capillary: 206 mg/dL — ABNORMAL HIGH (ref 70–99)

## 2021-07-12 LAB — MAGNESIUM: Magnesium: 2.1 mg/dL (ref 1.7–2.4)

## 2021-07-12 LAB — PHOSPHORUS: Phosphorus: 3.6 mg/dL (ref 2.5–4.6)

## 2021-07-12 LAB — MOLECULAR PATHOLOGY

## 2021-07-12 MED ORDER — CALCIUM CARBONATE ANTACID 500 MG PO CHEW
1.0000 | CHEWABLE_TABLET | Freq: Three times a day (TID) | ORAL | Status: DC | PRN
  Administered 2021-07-12 – 2021-07-13 (×3): 200 mg via ORAL
  Filled 2021-07-12 (×3): qty 1

## 2021-07-12 MED ORDER — MELATONIN 3 MG PO TABS
3.0000 mg | ORAL_TABLET | Freq: Every day | ORAL | Status: DC
  Administered 2021-07-12 – 2021-07-14 (×3): 3 mg via ORAL
  Filled 2021-07-12 (×5): qty 1

## 2021-07-12 NOTE — Progress Notes (Signed)
Received report. Assuming care of Patient.

## 2021-07-12 NOTE — Progress Notes (Addendum)
Occupational Therapy Treatment Patient Details Name: Stephen Diaz MRN: [id number] DOB: 04-Apr-1956 Today's Date: 07/12/2021   History of present illness Pt is a 66 y.o. male returning to [facility] on 1/25 after discharge on 1/24 for recent admission for progressively worsening Lt sided weakness. CT scan of brain significant for 3 masses (Rt and Lt) up to 3 cm in diameter with vasogenic edema present mass effect on the lateral ventricles, but no midline shift. CT and MRI in ED negative for acute intracranial findings such as CVA. 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   OT comments  Patient was educated on completion of LUE ROM and proper positioning for LUE in bed to maintain joint integrity. Patient verbalized understanding and was able to participate. Patient verbalized questions about radiation later this day. Patient's thoughts and concerns were heard and validated. Patient was encouraged to speak with radiation team about questions to get best answers about process. Nursing was educated on proper positioning of patient in bed as well. Patient would continue to benefit from skilled OT services at this time while admitted and after d/c to address noted deficits in order to improve overall safety and independence in ADLs.     Recommendations for follow up therapy are one component of a multi-disciplinary discharge planning process, led by the attending physician.  Recommendations may be updated based on patient status, additional functional criteria and insurance authorization.    Follow Up Recommendations  Acute inpatient rehab (3hours/day)    Assistance Recommended at Discharge Frequent or constant Supervision/Assistance  Patient can return home with the following  A lot of help with walking and/or transfers;A lot of help with bathing/dressing/bathroom;Assistance  with cooking/housework   Equipment Recommendations  BSC/3in1;Tub/shower bench    Recommendations for Other Services      Precautions / Restrictions Precautions Precautions: Fall Precaution Comments: 6 falls in last month at least, pt re-admitted after being home for <1 hour due to fall Restrictions Weight Bearing Restrictions: No       Mobility Bed Mobility                    Transfers                         Balance                                           ADL either performed or assessed with clinical judgement   ADL Overall ADL's : Needs assistance/impaired                                            Extremity/Trunk Assessment              Vision       Perception     Praxis      Cognition Arousal/Alertness: Awake/alert Behavior During Therapy: WFL for tasks assessed/performed Overall Cognitive Status: Within Functional Limits for tasks assessed  Exercises Other Exercises Other Exercises: patient was educated on AAROM of LUE and LUE positioning to preserve ROM and joint integrity. patient and nursing has education on proper positioning of pillows for LUE and keeping heels off bed. patient and nurse verbalized understanding. Other Exercises: Patient was able to demonstrate elbow AAROM and wrist AAROM with SUP with increased cues to participate in task in chair position in bed with back off bed. Other Exercises: patient participated in trunk control tasks in chair position in bed with good carryover.    Shoulder Instructions       General Comments      Pertinent Vitals/ Pain       Pain Assessment Pain Assessment: No/denies pain  Home Living                                          Prior Functioning/Environment              Frequency  Min 3X/week        Progress Toward Goals  OT Goals(current goals can  now be found in the care plan section)  Progress towards OT goals: Progressing toward goals     Plan Discharge plan remains appropriate    Co-evaluation                 AM-PAC OT "6 Clicks" Daily Activity     Outcome Measure   Help from another person eating meals?: A Little Help from another person taking care of personal grooming?: A Little Help from another person toileting, which includes using toliet, bedpan, or urinal?: A Lot Help from another person bathing (including washing, rinsing, drying)?: A Lot Help from another person to put on and taking off regular upper body clothing?: A Lot Help from another person to put on and taking off regular lower body clothing?: A Lot 6 Click Score: 14    End of Session    OT Visit Diagnosis: History of falling (Z91.81);Unsteadiness on feet (R26.81);Hemiplegia and hemiparesis Hemiplegia - Right/Left: Left Hemiplegia - dominant/non-dominant: Non-Dominant Hemiplegia - caused by: Unspecified   Activity Tolerance Patient tolerated treatment well   Patient Left in bed;with call bell/phone within reach;with bed alarm set   Nurse Communication Mobility status        Time: 1251-1314 OT Time Calculation (min): 23 min  Charges: OT General Charges $OT Visit: 1 Visit OT Treatments $Therapeutic Activity: 23-37 mins  Jackelyn Poling OTR/L, MS Acute Rehabilitation Department Office# [phone] Pager# [phone]   [name] 07/12/2021, 2:39 PM

## 2021-07-12 NOTE — Progress Notes (Signed)
PROGRESS NOTE    Stephen Diaz  ZCH:[id number] DOB: [birth year] DOA: 07/06/2021 PCP: [name], PA-C    Brief Narrative:  This 66 year old male with metastatic lung cancer to the brain, COPD, DM 2, HTN who comes into the hospital with worsening left-sided weakness.  Imaging in the ER with an MRI showed frontal lobes metastatic disease with vasogenic edema, no bleeding or midline shift.  Dr. Loleta Books discussed with neurooncology and radiation oncology with plans for urgent radiation therapy likely on Monday 07/11/21.  Assessment & Plan:   Principal Problem:   Vasogenic edema (HCC) Active Problems:   Chronic obstructive pulmonary disease (HCC)   Type 2 diabetes mellitus (HCC)   Squamous cell carcinoma of lung, stage I, right (HCC)   Metastasis to brain [facility])   Essential hypertension   Dyslipidemia   Acute left-sided weakness   Pressure injury of ischium, stage 1   Brain metastases (HCC)  Left sided weakness due to brain metastasis with vasogenic edema: Patient presented with worsening left-sided weakness. He underwent EEG on admission which was unremarkable for seizures. He was empirically started on Keppra and Decadron. Continue Keppra and Decadron. Dr. Loleta Books discussed shortly after admission and plans are to start radiation therapy on 07/11/2021. Palliative care discussed goals of care.  Stage IV lung cancer: Continue radiation treatment starting today 07/12/21 Palliative care consulted to discuss goals of care. Patient is interested in starting radiation treatment  Essential hypertension: Continue lisinopril.  Type 2 diabetes: Continue metformin, sliding scale. Continue steroids. Monitor CBG  COPD: Stable.  Continue home inhalers.  BPH: Continue tamsulosin  Hyponatremia Likely due to brain mets. Repeat Sodium 29>27,  Continue gentle IV hydration.   DVT prophylaxis:  SCDs Code Status: DNR Family Communication:  No family at bed side. Disposition  Plan:   Status is: Inpatient  Remains inpatient appropriate because:  Persistent left-sided weakness, urgent radiation therapy advised.  Consultants:  Radiation therapy, Palliative care  Procedures:  MRI Antimicrobials:  Anti-infectives (From admission, onward)    None       Subjective: Patient was seen and examined at bedside.  Overnight events noted. Patient reports feeling weak,  stated he is going to start radiation treatment today. He is not able to move left arm but able to move left leg.     Objective: Vitals:   07/12/21 0731 07/12/21 0734 07/12/21 0815 07/12/21 1009  BP: 102/70 106/75  110/77  Pulse:  (!) 57  75  Resp:  17    Temp:  97.6 F (36.4 C)    TempSrc:  Oral    SpO2:  98% 97% 97%  Weight:      Height:        Intake/Output Summary (Last 24 hours) at 07/12/2021 1134 Last data filed at 07/12/2021 0942 Gross per 24 hour  Intake 900 ml  Output 300 ml  Net 600 ml   Filed Weights   07/06/21 0356 07/06/21 2045  Weight: 79.4 kg 81.3 kg    Examination:  General exam: Appears comfortable, deconditioned, not in any acute distress. Respiratory system: Clear to auscultation bilaterally, respiratory effort normal, RR 15. Cardiovascular system: S1 & S2 heard, regular rate and rhythm, no murmur. Gastrointestinal system: Abdomen is soft, nontender, nondistended, BS+ Central nervous system: Alert and oriented x 3.  Left-sided weakness. Extremities: No edema, no cyanosis, no clubbing. Skin: No rashes, lesions or ulcers Psychiatry: Judgement and insight appear normal. Mood & affect appropriate.     Data Reviewed: I have personally reviewed  following labs and imaging studies  CBC: Recent Labs  Lab 07/06/21 0526 07/11/21 0435 07/12/21 0424  WBC 16.3* 14.1* 15.3*  HGB 13.5 13.3 13.9  HCT 42.3 40.9 41.3  MCV 86.5 84.9 83.9  PLT 277 298 494   Basic Metabolic Panel: Recent Labs  Lab 07/06/21 0526 07/09/21 0457 07/11/21 0435 07/12/21 0424  NA  135 130* 129* 127*  K 3.9 4.2 4.3 4.8  CL 100 96* 95* 94*  CO2 24 25 24 22   GLUCOSE 126* 140* 140* 161*  BUN 25* 22 25* 22  CREATININE 0.65 0.66 0.56* 0.55*  CALCIUM 9.2 8.8* 9.0 8.7*  MG  --   --   --  2.1  PHOS  --   --   --  3.6   GFR: Estimated Creatinine Clearance: 102.6 mL/min (A) (by C-G formula based on SCr of 0.55 mg/dL (L)). Liver Function Tests: Recent Labs  Lab 07/06/21 0526  AST 28  ALT 36  ALKPHOS 65  BILITOT 0.8  PROT 7.4  ALBUMIN 3.8   No results for input(s): LIPASE, AMYLASE in the last 168 hours. No results for input(s): AMMONIA in the last 168 hours. Coagulation Profile: No results for input(s): INR, PROTIME in the last 168 hours. Cardiac Enzymes: No results for input(s): CKTOTAL, CKMB, CKMBINDEX, TROPONINI in the last 168 hours. BNP (last 3 results) No results for input(s): PROBNP in the last 8760 hours. HbA1C: No results for input(s): HGBA1C in the last 72 hours. CBG: Recent Labs  Lab 07/11/21 1131 07/11/21 1702 07/11/21 2140 07/12/21 0719 07/12/21 1117  GLUCAP 177* 171* 229* 145* 268*   Lipid Profile: No results for input(s): CHOL, HDL, LDLCALC, TRIG, CHOLHDL, LDLDIRECT in the last 72 hours. Thyroid Function Tests: No results for input(s): TSH, T4TOTAL, FREET4, T3FREE, THYROIDAB in the last 72 hours. Anemia Panel: No results for input(s): VITAMINB12, FOLATE, FERRITIN, TIBC, IRON, RETICCTPCT in the last 72 hours. Sepsis Labs: No results for input(s): PROCALCITON, LATICACIDVEN in the last 168 hours.  Recent Results (from the past 240 hour(s))  Resp Panel by RT-PCR (Flu A&B, Covid) Nasopharyngeal Swab     Status: None   Collection Time: 07/02/21  9:17 PM   Specimen: Nasopharyngeal Swab; Nasopharyngeal(NP) swabs in vial transport medium  Result Value Ref Range Status   SARS Coronavirus 2 by RT PCR NEGATIVE NEGATIVE Final    Comment: (NOTE) SARS-CoV-2 target nucleic acids are NOT DETECTED.  The SARS-CoV-2 RNA is generally detectable in  upper respiratory specimens during the acute phase of infection. The lowest concentration of SARS-CoV-2 viral copies this assay can detect is 138 copies/mL. A negative result does not preclude SARS-Cov-2 infection and should not be used as the sole basis for treatment or other patient management decisions. A negative result may occur with  improper specimen collection/handling, submission of specimen other than nasopharyngeal swab, presence of viral mutation(s) within the areas targeted by this assay, and inadequate number of viral copies(<138 copies/mL). A negative result must be combined with clinical observations, patient history, and epidemiological information. The expected result is Negative.  Fact Sheet for Patients:  EntrepreneurPulse.com.au  Fact Sheet for Healthcare Providers:  IncredibleEmployment.be  This test is no t yet approved or cleared by the [place] FDA and  has been authorized for detection and/or diagnosis of SARS-CoV-2 by FDA under an Emergency Use Authorization (EUA). This EUA will remain  in effect (meaning this test can be used) for the duration of the COVID-19 declaration under Section 564(b)(1) of the Act, 21  U.S.C.section 360bbb-3(b)(1), unless the authorization is terminated  or revoked sooner.       Influenza A by PCR NEGATIVE NEGATIVE Final   Influenza B by PCR NEGATIVE NEGATIVE Final    Comment: (NOTE) The Xpert Xpress SARS-CoV-2/FLU/RSV plus assay is intended as an aid in the diagnosis of influenza from Nasopharyngeal swab specimens and should not be used as a sole basis for treatment. Nasal washings and aspirates are unacceptable for Xpert Xpress SARS-CoV-2/FLU/RSV testing.  Fact Sheet for Patients: EntrepreneurPulse.com.au  Fact Sheet for Healthcare Providers: IncredibleEmployment.be  This test is not yet approved or cleared by the [place] FDA and has been  authorized for detection and/or diagnosis of SARS-CoV-2 by FDA under an Emergency Use Authorization (EUA). This EUA will remain in effect (meaning this test can be used) for the duration of the COVID-19 declaration under Section 564(b)(1) of the Act, 21 U.S.C. section 360bbb-3(b)(1), unless the authorization is terminated or revoked.  Performed at [facility], [place] [address], [place], [place] [address]   Resp Panel by RT-PCR (Flu A&B, Covid) Nasopharyngeal Swab     Status: None   Collection Time: 07/06/21  3:44 PM   Specimen: Nasopharyngeal Swab; Nasopharyngeal(NP) swabs in vial transport medium  Result Value Ref Range Status   SARS Coronavirus 2 by RT PCR NEGATIVE NEGATIVE Final    Comment: (NOTE) SARS-CoV-2 target nucleic acids are NOT DETECTED.  The SARS-CoV-2 RNA is generally detectable in upper respiratory specimens during the acute phase of infection. The lowest concentration of SARS-CoV-2 viral copies this assay can detect is 138 copies/mL. A negative result does not preclude SARS-Cov-2 infection and should not be used as the sole basis for treatment or other patient management decisions. A negative result may occur with  improper specimen collection/handling, submission of specimen other than nasopharyngeal swab, presence of viral mutation(s) within the areas targeted by this assay, and inadequate number of viral copies(<138 copies/mL). A negative result must be combined with clinical observations, patient history, and epidemiological information. The expected result is Negative.  Fact Sheet for Patients:  EntrepreneurPulse.com.au  Fact Sheet for Healthcare Providers:  IncredibleEmployment.be  This test is no t yet approved or cleared by the [place] FDA and  has been authorized for detection and/or diagnosis of SARS-CoV-2 by FDA under an Emergency Use Authorization (EUA). This EUA will remain  in effect (meaning this test  can be used) for the duration of the COVID-19 declaration under Section 564(b)(1) of the Act, 21 U.S.C.section 360bbb-3(b)(1), unless the authorization is terminated  or revoked sooner.       Influenza A by PCR NEGATIVE NEGATIVE Final   Influenza B by PCR NEGATIVE NEGATIVE Final    Comment: (NOTE) The Xpert Xpress SARS-CoV-2/FLU/RSV plus assay is intended as an aid in the diagnosis of influenza from Nasopharyngeal swab specimens and should not be used as a sole basis for treatment. Nasal washings and aspirates are unacceptable for Xpert Xpress SARS-CoV-2/FLU/RSV testing.  Fact Sheet for Patients: EntrepreneurPulse.com.au  Fact Sheet for Healthcare Providers: IncredibleEmployment.be  This test is not yet approved or cleared by the [place] FDA and has been authorized for detection and/or diagnosis of SARS-CoV-2 by FDA under an Emergency Use Authorization (EUA). This EUA will remain in effect (meaning this test can be used) for the duration of the COVID-19 declaration under Section 564(b)(1) of the Act, 21 U.S.C. section 360bbb-3(b)(1), unless the authorization is terminated or revoked.  Performed at [facility], [address] [place],  [place] [address]    Radiology Studies: No results found.  Scheduled Meds:  dexamethasone  4 mg Oral TID   famotidine  20 mg Oral Daily   fluticasone furoate-vilanterol  1 puff Inhalation Daily   And   umeclidinium bromide  1 puff Inhalation Daily   insulin aspart  0-15 Units Subcutaneous TID WC   levETIRAcetam  500 mg Oral BID   lisinopril  5 mg Oral Daily   metFORMIN  1,000 mg Oral Q breakfast   tamsulosin  0.4 mg Oral QPC breakfast   Continuous Infusions:   LOS: 5 days    Time spent: 35 MINS.    Shawna Clamp, MD [facility]   If 7PM-7AM, please contact night-coverage

## 2021-07-13 ENCOUNTER — Other Ambulatory Visit: Payer: Medicare Other

## 2021-07-13 DIAGNOSIS — G936 Cerebral edema: Secondary | ICD-10-CM | POA: Diagnosis not present

## 2021-07-13 LAB — CBC
HCT: 42.5 % (ref 39.0–52.0)
Hemoglobin: 14 g/dL (ref 13.0–17.0)
MCH: 27.8 pg (ref 26.0–34.0)
MCHC: 32.9 g/dL (ref 30.0–36.0)
MCV: 84.3 fL (ref 80.0–100.0)
Platelets: 319 10*3/uL (ref 150–400)
RBC: 5.04 MIL/uL (ref 4.22–5.81)
RDW: 14.5 % (ref 11.5–15.5)
WBC: 15.7 10*3/uL — ABNORMAL HIGH (ref 4.0–10.5)
nRBC: 0 % (ref 0.0–0.2)

## 2021-07-13 LAB — MAGNESIUM: Magnesium: 2.1 mg/dL (ref 1.7–2.4)

## 2021-07-13 LAB — BASIC METABOLIC PANEL
Anion gap: 10 (ref 5–15)
BUN: 25 mg/dL — ABNORMAL HIGH (ref 8–23)
CO2: 24 mmol/L (ref 22–32)
Calcium: 8.9 mg/dL (ref 8.9–10.3)
Chloride: 93 mmol/L — ABNORMAL LOW (ref 98–111)
Creatinine, Ser: 0.54 mg/dL — ABNORMAL LOW (ref 0.61–1.24)
GFR, Estimated: >60 mL/min (ref >60)
Glucose, Bld: 157 mg/dL — ABNORMAL HIGH (ref 70–99)
Potassium: 4.4 mmol/L (ref 3.5–5.1)
Sodium: 127 mmol/L — ABNORMAL LOW (ref 135–145)

## 2021-07-13 LAB — GLUCOSE, CAPILLARY
Glucose-Capillary: 141 mg/dL — ABNORMAL HIGH (ref 70–99)
Glucose-Capillary: 183 mg/dL — ABNORMAL HIGH (ref 70–99)
Glucose-Capillary: 186 mg/dL — ABNORMAL HIGH (ref 70–99)

## 2021-07-13 LAB — PHOSPHORUS: Phosphorus: 4.1 mg/dL (ref 2.5–4.6)

## 2021-07-13 NOTE — Progress Notes (Signed)
Report received from [place], [place] at 0930. Care assumed for pt at that time. Pt resting in bed, inc care provided. Assessment unchanged from 0800. Medicated for back pain. Pt resting comfortably with no further c/o at prese4nt.

## 2021-07-13 NOTE — NC FL2 (Signed)
[place] LEVEL OF CARE SCREENING TOOL     IDENTIFICATION  Patient Name: Stephen Diaz Birthdate: 01-29-1956 Sex: male Admission Date (Current Location): 07/06/2021  [facility] and [place] Number:  Herbalist and Address:  [facility],  [place] [place], [place]      Provider Number: [id number]  Attending Physician Name and Address:  Shawna Clamp, MD  Relative Name and Phone Number:       Current Level of Care: Hospital Recommended Level of Care: [place] Prior Approval Number:    Date Approved/Denied:   PASRR Number: [id number]  Discharge Plan: SNF    Current Diagnoses: Patient Active Problem List   Diagnosis Date Noted   Pressure injury of ischium, stage 1 07/07/2021   Brain metastases ([place]) 07/07/2021   Acute left-sided weakness 07/06/2021   Protein-calorie malnutrition, severe 07/04/2021   Metastasis to brain ([place]) 07/03/2021   Tobacco abuse 07/03/2021   Vasogenic edema ([place]) 07/03/2021   Neoplasm causing mass effect on adjacent structures 07/03/2021   BPH (benign prostatic hyperplasia) 07/03/2021   Leukocytosis 07/03/2021   Left-sided weakness 07/03/2021   Squamous cell carcinoma of lung, stage I, right ([place]) 06/21/2021   S/P Xi Robotic Assisted Video Thoracoscopy with Right Lower Lobectomy 05/20/2021   Nodule of lower lobe of right lung 05/12/2021   Type 2 diabetes mellitus ([place]) 05/12/2021   Colitis 12/30/2020   Encounter for colonoscopy due to history of adenomatous colonic polyps 10/26/2020   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/08/2020   Dyslipidemia 01/29/2018   Chronic obstructive pulmonary disease ([place]) 10/17/2016   Essential hypertension 10/17/2016    Orientation RESPIRATION BLADDER Height & Weight     Self, Situation, Time, Place  Normal Continent Weight: 81.3 kg Height:  6\' 1"  (185.4 cm)  BEHAVIORAL SYMPTOMS/MOOD NEUROLOGICAL BOWEL NUTRITION STATUS       Continent Diet (regular)  AMBULATORY STATUS COMMUNICATION OF NEEDS Skin   Extensive Assist Verbally Normal                       Personal Care Assistance Level of Assistance  Bathing, Feeding, Dressing Bathing Assistance: Limited assistance Feeding assistance: Limited assistance Dressing Assistance: Limited assistance     Functional Limitations Info  Sight, Hearing, Speech Sight Info: Adequate Hearing Info: Adequate Speech Info: Adequate    SPECIAL CARE FACTORS FREQUENCY  PT (By licensed PT), OT (By licensed OT)     PT Frequency: 5 x weekly OT Frequency: 5 x weekly            Contractures Contractures Info: Not present    Additional Factors Info  Code Status Code Status Info: DNR             Current Medications (07/13/2021):  This is the current hospital active medication list Current Facility-Administered Medications  Medication Dose Route Frequency Provider Last Rate Last Admin   acetaminophen (TYLENOL) tablet 650 mg  650 mg Oral Q6H PRN Reubin Milan, MD       Or   acetaminophen (TYLENOL) suppository 650 mg  650 mg Rectal Q6H PRN Reubin Milan, MD       albuterol (PROVENTIL) (2.5 MG/3ML) 0.083% nebulizer solution 2.5 mg  2.5 mg Nebulization Q6H PRN Reubin Milan, MD   2.5 mg at 07/11/21 2301   calcium carbonate (TUMS - dosed in mg elemental calcium) chewable tablet 200 mg of elemental calcium  1 tablet Oral TID PRN Dwyane Dee,  Pardeep, MD   200 mg of elemental calcium at 07/13/21 0804   dexamethasone (DECADRON) tablet 4 mg  4 mg Oral TID Reubin Milan, MD   4 mg at 07/13/21 0804   docusate sodium (COLACE) capsule 100 mg  100 mg Oral Daily PRN Lovey Newcomer T, NP   100 mg at 07/08/21 0008   famotidine (PEPCID) tablet 20 mg  20 mg Oral Daily Caren Griffins, MD   20 mg at 07/13/21 0804   fentaNYL (SUBLIMAZE) injection 25 mcg  25 mcg Intravenous Q2H PRN Reubin Milan, MD       fluticasone furoate-vilanterol (BREO ELLIPTA) 100-25  MCG/ACT 1 puff  1 puff Inhalation Daily Reubin Milan, MD   1 puff at 07/13/21 0744   And   umeclidinium bromide (INCRUSE ELLIPTA) 62.5 MCG/ACT 1 puff  1 puff Inhalation Daily Reubin Milan, MD   1 puff at 07/13/21 0744   guaiFENesin-dextromethorphan (ROBITUSSIN DM) 100-10 MG/5ML syrup 5 mL  5 mL Oral Q4H PRN Caren Griffins, MD   5 mL at 07/11/21 2207   insulin aspart (novoLOG) injection 0-15 Units  0-15 Units Subcutaneous TID WC Reubin Milan, MD   3 Units at 07/13/21 1231   levETIRAcetam (KEPPRA) tablet 500 mg  500 mg Oral BID Florencia Reasons, MD   500 mg at 07/13/21 0803   lisinopril (ZESTRIL) tablet 5 mg  5 mg Oral Daily Florencia Reasons, MD   5 mg at 07/13/21 6301   LORazepam (ATIVAN) tablet 0.5 mg  0.5 mg Oral Daily PRN [name], PA-C   0.5 mg at 07/12/21 2129   melatonin tablet 3 mg  3 mg Oral QHS Shawna Clamp, MD   3 mg at 07/12/21 2128   metFORMIN (GLUCOPHAGE) tablet 1,000 mg  1,000 mg Oral Q breakfast Reubin Milan, MD   1,000 mg at 07/13/21 0804   ondansetron [facility]) tablet 4 mg  4 mg Oral Q6H PRN Reubin Milan, MD       Or   ondansetron [facility]) injection 4 mg  4 mg Intravenous Q6H PRN Reubin Milan, MD       oxyCODONE (Oxy IR/ROXICODONE) immediate release tablet 5 mg  5 mg Oral Q4H PRN Reubin Milan, MD   5 mg at 07/13/21 1045   tamsulosin (FLOMAX) capsule 0.4 mg  0.4 mg Oral QPC breakfast Reubin Milan, MD   0.4 mg at 07/13/21 6010     Discharge Medications: Please see discharge summary for a list of discharge medications.  Relevant Imaging Results:  Relevant Lab Results:   Additional Information XNA:[id number]  Leeroy Cha, RN

## 2021-07-13 NOTE — Progress Notes (Signed)
PROGRESS NOTE    Stephen Diaz  NLG:[id number] DOB: [birth year] DOA: 07/06/2021 PCP: [name], PA-C    Brief Narrative:  This 66 year old male with metastatic lung cancer to the brain, COPD, DM 2, HTN who comes into the hospital with worsening left-sided weakness.  Imaging in the ER with an MRI showed frontal lobes metastatic disease with vasogenic edema, no bleeding or midline shift.  Dr. Loleta Books discussed with neurooncology and radiation oncology with plans for urgent radiation therapy likely on Monday 07/12/21.  Assessment & Plan:   Principal Problem:   Vasogenic edema (HCC) Active Problems:   Chronic obstructive pulmonary disease (HCC)   Type 2 diabetes mellitus (HCC)   Squamous cell carcinoma of lung, stage I, right (HCC)   Metastasis to brain [facility])   Essential hypertension   Dyslipidemia   Acute left-sided weakness   Pressure injury of ischium, stage 1   Brain metastases (HCC)  Left sided weakness due to brain metastasis with vasogenic edema: Patient presented with worsening left-sided weakness. He underwent EEG on admission which was unremarkable for seizures. He was empirically started on Keppra and Decadron. Continue Keppra and Decadron. Dr. Loleta Books discussed shortly after admission and plans are to start radiation therapy on 07/12/2021. Patient has radiation therapy started yesterday,  reports slight improvement in his weakness. Palliative care discussed goals of care.  Stage IV lung cancer: Continue radiation treatment started on 07/12/2021. Palliative care consulted to discuss goals of care. Patient wants to follow-up with oncology to see if systemic therapy can be started.  Essential hypertension: Continue lisinopril.  Type 2 diabetes: Continue metformin, sliding scale. Continue steroids. Monitor CBG  COPD: Stable.  Continue home inhalers.  BPH: Continue tamsulosin  Hyponatremia Likely due to brain mets. Repeat Sodium 129>127,  Continue  gentle IV hydration.   DVT prophylaxis:  SCDs Code Status: DNR Family Communication:  No family at bed side. Disposition Plan:   Status is: Inpatient  Remains inpatient appropriate because:  Persistent left-sided weakness, urgent radiation therapy advised.  Consultants:  Radiation therapy, Palliative care  Procedures:  MRI Antimicrobials:  Anti-infectives (From admission, onward)    None       Subjective: Patient was seen and examined at bedside.  Overnight events noted. Patient reports feeling slightly better, He has radiation treatment started yesterday He is not able to move left arm but able to move left leg.     Objective: Vitals:   07/12/21 2148 07/13/21 0505 07/13/21 0745 07/13/21 1146  BP: 104/77 101/72  90/68  Pulse: 68 (!) 53  67  Resp: 20 17  18   Temp: 97.8 F (36.6 C) 97.9 F (36.6 C)  97.6 F (36.4 C)  TempSrc: Oral Oral  Oral  SpO2: 97% 98% 99% 97%  Weight:      Height:        Intake/Output Summary (Last 24 hours) at 07/13/2021 1239 Last data filed at 07/13/2021 1232 Gross per 24 hour  Intake 880 ml  Output 1700 ml  Net -820 ml   Filed Weights   07/06/21 0356 07/06/21 2045  Weight: 79.4 kg 81.3 kg    Examination:  General exam: Appears comfortable, deconditioned, not in any distress. Respiratory system: Clear to auscultation bilaterally, respiratory effort normal, RR 14. Cardiovascular system: S1 & S2 heard, regular rate and rhythm, no murmur. Gastrointestinal system: Abdomen is soft, nontender, nondistended, BS+ Central nervous system: Alert and oriented x 3.  Left-sided weakness, moves left leg, not able to move left arm. Extremities: No  edema, no cyanosis, no clubbing. Skin: No rashes, lesions or ulcers Psychiatry: Judgement and insight appear normal. Mood & affect appropriate.     Data Reviewed: I have personally reviewed following labs and imaging studies  CBC: Recent Labs  Lab 07/11/21 0435 07/12/21 0424 07/13/21 0447   WBC 14.1* 15.3* 15.7*  HGB 13.3 13.9 14.0  HCT 40.9 41.3 42.5  MCV 84.9 83.9 84.3  PLT 298 317 650   Basic Metabolic Panel: Recent Labs  Lab 07/09/21 0457 07/11/21 0435 07/12/21 0424 07/13/21 0447  NA 130* 129* 127* 127*  K 4.2 4.3 4.8 4.4  CL 96* 95* 94* 93*  CO2 25 24 22 24   GLUCOSE 140* 140* 161* 157*  BUN 22 25* 22 25*  CREATININE 0.66 0.56* 0.55* 0.54*  CALCIUM 8.8* 9.0 8.7* 8.9  MG  --   --  2.1 2.1  PHOS  --   --  3.6 4.1   GFR: Estimated Creatinine Clearance: 102.6 mL/min (A) (by C-G formula based on SCr of 0.54 mg/dL (L)). Liver Function Tests: No results for input(s): AST, ALT, ALKPHOS, BILITOT, PROT, ALBUMIN in the last 168 hours.  No results for input(s): LIPASE, AMYLASE in the last 168 hours. No results for input(s): AMMONIA in the last 168 hours. Coagulation Profile: No results for input(s): INR, PROTIME in the last 168 hours. Cardiac Enzymes: No results for input(s): CKTOTAL, CKMB, CKMBINDEX, TROPONINI in the last 168 hours. BNP (last 3 results) No results for input(s): PROBNP in the last 8760 hours. HbA1C: No results for input(s): HGBA1C in the last 72 hours. CBG: Recent Labs  Lab 07/12/21 1117 07/12/21 1659 07/12/21 2149 07/13/21 0731 07/13/21 1147  GLUCAP 268* 102* 206* 141* 183*   Lipid Profile: No results for input(s): CHOL, HDL, LDLCALC, TRIG, CHOLHDL, LDLDIRECT in the last 72 hours. Thyroid Function Tests: No results for input(s): TSH, T4TOTAL, FREET4, T3FREE, THYROIDAB in the last 72 hours. Anemia Panel: No results for input(s): VITAMINB12, FOLATE, FERRITIN, TIBC, IRON, RETICCTPCT in the last 72 hours. Sepsis Labs: No results for input(s): PROCALCITON, LATICACIDVEN in the last 168 hours.  Recent Results (from the past 240 hour(s))  Resp Panel by RT-PCR (Flu A&B, Covid) Nasopharyngeal Swab     Status: None   Collection Time: 07/06/21  3:44 PM   Specimen: Nasopharyngeal Swab; Nasopharyngeal(NP) swabs in vial transport medium  Result  Value Ref Range Status   SARS Coronavirus 2 by RT PCR NEGATIVE NEGATIVE Final    Comment: (NOTE) SARS-CoV-2 target nucleic acids are NOT DETECTED.  The SARS-CoV-2 RNA is generally detectable in upper respiratory specimens during the acute phase of infection. The lowest concentration of SARS-CoV-2 viral copies this assay can detect is 138 copies/mL. A negative result does not preclude SARS-Cov-2 infection and should not be used as the sole basis for treatment or other patient management decisions. A negative result may occur with  improper specimen collection/handling, submission of specimen other than nasopharyngeal swab, presence of viral mutation(s) within the areas targeted by this assay, and inadequate number of viral copies(<138 copies/mL). A negative result must be combined with clinical observations, patient history, and epidemiological information. The expected result is Negative.  Fact Sheet for Patients:  EntrepreneurPulse.com.au  Fact Sheet for Healthcare Providers:  IncredibleEmployment.be  This test is no t yet approved or cleared by the [place] FDA and  has been authorized for detection and/or diagnosis of SARS-CoV-2 by FDA under an Emergency Use Authorization (EUA). This EUA will remain  in effect (meaning this test  can be used) for the duration of the COVID-19 declaration under Section 564(b)(1) of the Act, 21 U.S.C.section 360bbb-3(b)(1), unless the authorization is terminated  or revoked sooner.       Influenza A by PCR NEGATIVE NEGATIVE Final   Influenza B by PCR NEGATIVE NEGATIVE Final    Comment: (NOTE) The Xpert Xpress SARS-CoV-2/FLU/RSV plus assay is intended as an aid in the diagnosis of influenza from Nasopharyngeal swab specimens and should not be used as a sole basis for treatment. Nasal washings and aspirates are unacceptable for Xpert Xpress SARS-CoV-2/FLU/RSV testing.  Fact Sheet for  Patients: EntrepreneurPulse.com.au  Fact Sheet for Healthcare Providers: IncredibleEmployment.be  This test is not yet approved or cleared by the [place] FDA and has been authorized for detection and/or diagnosis of SARS-CoV-2 by FDA under an Emergency Use Authorization (EUA). This EUA will remain in effect (meaning this test can be used) for the duration of the COVID-19 declaration under Section 564(b)(1) of the Act, 21 U.S.C. section 360bbb-3(b)(1), unless the authorization is terminated or revoked.  Performed at [facility], [place] [address] [place], [place] [address]    Radiology Studies: No results found.  Scheduled Meds:  dexamethasone  4 mg Oral TID   famotidine  20 mg Oral Daily   fluticasone furoate-vilanterol  1 puff Inhalation Daily   And   umeclidinium bromide  1 puff Inhalation Daily   insulin aspart  0-15 Units Subcutaneous TID WC   levETIRAcetam  500 mg Oral BID   lisinopril  5 mg Oral Daily   melatonin  3 mg Oral QHS   metFORMIN  1,000 mg Oral Q breakfast   tamsulosin  0.4 mg Oral QPC breakfast   Continuous Infusions:   LOS: 6 days    Time spent: 35 MINS.    Shawna Clamp, MD [facility]   If 7PM-7AM, please contact night-coverage

## 2021-07-13 NOTE — Plan of Care (Signed)

## 2021-07-13 NOTE — Care Management Important Message (Signed)
Important Message  Patient Details IM Letter placed in Patients room. Name: Stephen Diaz MRN: [id number] Date of Birth: [birth year]   Medicare Important Message Given:  Yes     Kerin Salen 07/13/2021, 11:23 AM

## 2021-07-13 NOTE — Progress Notes (Signed)
Received report from ongoing nurse. Assumed care of patient. Assessed. Pt is resting in bed.No c/o of pain at this time. Alert to person place and time. Repositioned patient in bed. Will cont to monitor.

## 2021-07-13 NOTE — Progress Notes (Addendum)
Inpatient Rehab Admissions Coordinator:   Spoke to patient and his daughter over the phone.  Unfortunately, pt will not have sufficient caregiver support at discharge to facilitate a CIR admission.  Both pt and daughter are on board for transition to SNF for short term rehab.  I will sign off for CIR and will let TOC team know.    Shann Medal, PT, DPT Admissions Coordinator [phone] 07/13/21  1:44 PM

## 2021-07-13 NOTE — TOC Progression Note (Signed)
Transition of Care [facility]) - Progression Note    Patient Details  Name: Stephen Diaz MRN: [id number] Date of Birth: [birth year]  Transition of Care [facility]) CM/SW Contact  Leeroy Cha, RN Phone Number: 07/13/2021, 2:50 PM  Clinical Narrative:    Cir is unable to take with little or no family backup at home.  Fl2 sent out to area for [place]     Barriers to Discharge: No Barriers Identified  Expected Discharge Plan and Services                                                 Social Determinants of Health (SDOH) Interventions    Readmission Risk Interventions No flowsheet data found.

## 2021-07-14 ENCOUNTER — Ambulatory Visit: Payer: Medicare Other | Admitting: Radiation Oncology

## 2021-07-14 ENCOUNTER — Ambulatory Visit: Payer: Medicare Other

## 2021-07-14 DIAGNOSIS — G936 Cerebral edema: Secondary | ICD-10-CM | POA: Diagnosis not present

## 2021-07-14 LAB — BASIC METABOLIC PANEL
Anion gap: 9 (ref 5–15)
BUN: 27 mg/dL — ABNORMAL HIGH (ref 8–23)
CO2: 25 mmol/L (ref 22–32)
Calcium: 8.9 mg/dL (ref 8.9–10.3)
Chloride: 93 mmol/L — ABNORMAL LOW (ref 98–111)
Creatinine, Ser: 0.65 mg/dL (ref 0.61–1.24)
GFR, Estimated: >60 mL/min (ref >60)
Glucose, Bld: 159 mg/dL — ABNORMAL HIGH (ref 70–99)
Potassium: 4.4 mmol/L (ref 3.5–5.1)
Sodium: 127 mmol/L — ABNORMAL LOW (ref 135–145)

## 2021-07-14 LAB — CBC
HCT: 40.3 % (ref 39.0–52.0)
Hemoglobin: 13.8 g/dL (ref 13.0–17.0)
MCH: 28.3 pg (ref 26.0–34.0)
MCHC: 34.2 g/dL (ref 30.0–36.0)
MCV: 82.6 fL (ref 80.0–100.0)
Platelets: 353 10*3/uL (ref 150–400)
RBC: 4.88 MIL/uL (ref 4.22–5.81)
RDW: 14.6 % (ref 11.5–15.5)
WBC: 19.4 10*3/uL — ABNORMAL HIGH (ref 4.0–10.5)
nRBC: 0 % (ref 0.0–0.2)

## 2021-07-14 LAB — PHOSPHORUS: Phosphorus: 4.4 mg/dL (ref 2.5–4.6)

## 2021-07-14 LAB — MAGNESIUM: Magnesium: 1.9 mg/dL (ref 1.7–2.4)

## 2021-07-14 LAB — GLUCOSE, CAPILLARY
Glucose-Capillary: 154 mg/dL — ABNORMAL HIGH (ref 70–99)
Glucose-Capillary: 225 mg/dL — ABNORMAL HIGH (ref 70–99)
Glucose-Capillary: 135 mg/dL — ABNORMAL HIGH (ref 70–99)
Glucose-Capillary: 259 mg/dL — ABNORMAL HIGH (ref 70–99)

## 2021-07-14 NOTE — Progress Notes (Signed)
PROGRESS NOTE    Stephen Diaz  HWE:[id number] DOB: 1956/06/08 DOA: 07/06/2021 PCP: [name], PA-C    Brief Narrative:  This 66 year old male with metastatic lung cancer to the brain, COPD, DM 2, HTN who comes into the hospital with worsening left-sided weakness.  Imaging in the ER with an MRI showed frontal lobes metastatic disease with vasogenic edema, no bleeding or midline shift.  Dr. Loleta Books discussed with neurooncology and radiation oncology with plans for urgent radiation therapy likely on Monday 07/12/21.  Assessment & Plan:   Principal Problem:   Vasogenic edema (HCC) Active Problems:   Chronic obstructive pulmonary disease (HCC)   Type 2 diabetes mellitus (HCC)   Squamous cell carcinoma of lung, stage I, right (HCC)   Metastasis to brain [facility])   Essential hypertension   Dyslipidemia   Acute left-sided weakness   Pressure injury of ischium, stage 1   Brain metastases (HCC)  Left sided weakness due to brain metastasis with vasogenic edema: Patient presented with worsening left-sided weakness. He underwent EEG on admission which was unremarkable for seizures. He was empirically started on Keppra and Decadron. Continue Keppra and Decadron. Dr. Loleta Books discussed shortly after admission and plans are to start radiation therapy on 07/12/2021. Patient has radiation therapy started on 07/12/21,  reports slight improvement in his weakness. Palliative care discussed goals of care.  Stage IV lung cancer: Continue radiation treatment started on 07/12/2021. Palliative care consulted to discuss goals of care. Patient wants to follow-up with oncology to see if systemic therapy can be started.  Essential hypertension: Continue lisinopril.  Type 2 diabetes: Continue metformin, sliding scale. Continue steroids. Monitor CBG  COPD: Stable.  Continue home inhalers.  BPH: Continue tamsulosin  Hyponatremia Likely due to brain mets. Repeat Sodium 129>127>127 Continue  gentle IV hydration.   DVT prophylaxis:  SCDs Code Status: DNR Family Communication:  No family at bed side. Disposition Plan:   Status is: Inpatient  Remains inpatient appropriate because:  Persistent left-sided weakness, urgent radiation therapy advised.  Consultants:  Radiation therapy, Palliative care  Procedures:  MRI Antimicrobials:  Anti-infectives (From admission, onward)    None       Subjective: Patient was seen and examined at bedside.  Overnight events noted. Patient had radiation treatment started on 07/12/2021.  He reports slight improvement in his strength. He is not able to move left arm but able to move left leg.     Objective: Vitals:   07/13/21 1146 07/13/21 2139 07/14/21 0741 07/14/21 1049  BP: 90/68 115/88  98/71  Pulse: 67 68    Resp: 18 20    Temp: 97.6 F (36.4 C) 98 F (36.7 C)    TempSrc: Oral Oral    SpO2: 97% 95% 96%   Weight:      Height:        Intake/Output Summary (Last 24 hours) at 07/14/2021 1124 Last data filed at 07/14/2021 8938 Gross per 24 hour  Intake 360 ml  Output 900 ml  Net -540 ml   Filed Weights   07/06/21 0356 07/06/21 2045  Weight: 79.4 kg 81.3 kg    Examination:  General exam: Appears deconditioned, comfortable, not in any distress. Respiratory system: Clear to auscultation bilaterally, respiratory effort normal, RR 13 Cardiovascular system: S1 & S2 heard, regular rate and rhythm, no murmur. Gastrointestinal system: Abdomen is soft, nontender, nondistended, BS+ Central nervous system: Alert and oriented x3 . left-sided weakness, moves left leg, not able to move left arm. Extremities: No edema,  no cyanosis, no clubbing. Skin: No rashes, lesions or ulcers Psychiatry: Judgement and insight appear normal. Mood & affect appropriate.     Data Reviewed: I have personally reviewed following labs and imaging studies  CBC: Recent Labs  Lab 07/11/21 0435 07/12/21 0424 07/13/21 0447 07/14/21 0449  WBC 14.1*  15.3* 15.7* 19.4*  HGB 13.3 13.9 14.0 13.8  HCT 40.9 41.3 42.5 40.3  MCV 84.9 83.9 84.3 82.6  PLT 298 317 319 960   Basic Metabolic Panel: Recent Labs  Lab 07/09/21 0457 07/11/21 0435 07/12/21 0424 07/13/21 0447 07/14/21 0449  NA 130* 129* 127* 127* 127*  K 4.2 4.3 4.8 4.4 4.4  CL 96* 95* 94* 93* 93*  CO2 25 24 22 24 25   GLUCOSE 140* 140* 161* 157* 159*  BUN 22 25* 22 25* 27*  CREATININE 0.66 0.56* 0.55* 0.54* 0.65  CALCIUM 8.8* 9.0 8.7* 8.9 8.9  MG  --   --  2.1 2.1 1.9  PHOS  --   --  3.6 4.1 4.4   GFR: Estimated Creatinine Clearance: 102.6 mL/min (by C-G formula based on SCr of 0.65 mg/dL). Liver Function Tests: No results for input(s): AST, ALT, ALKPHOS, BILITOT, PROT, ALBUMIN in the last 168 hours.  No results for input(s): LIPASE, AMYLASE in the last 168 hours. No results for input(s): AMMONIA in the last 168 hours. Coagulation Profile: No results for input(s): INR, PROTIME in the last 168 hours. Cardiac Enzymes: No results for input(s): CKTOTAL, CKMB, CKMBINDEX, TROPONINI in the last 168 hours. BNP (last 3 results) No results for input(s): PROBNP in the last 8760 hours. HbA1C: No results for input(s): HGBA1C in the last 72 hours. CBG: Recent Labs  Lab 07/12/21 2149 07/13/21 0731 07/13/21 1147 07/13/21 1611 07/14/21 0733  GLUCAP 206* 141* 183* 186* 154*   Lipid Profile: No results for input(s): CHOL, HDL, LDLCALC, TRIG, CHOLHDL, LDLDIRECT in the last 72 hours. Thyroid Function Tests: No results for input(s): TSH, T4TOTAL, FREET4, T3FREE, THYROIDAB in the last 72 hours. Anemia Panel: No results for input(s): VITAMINB12, FOLATE, FERRITIN, TIBC, IRON, RETICCTPCT in the last 72 hours. Sepsis Labs: No results for input(s): PROCALCITON, LATICACIDVEN in the last 168 hours.  Recent Results (from the past 240 hour(s))  Resp Panel by RT-PCR (Flu A&B, Covid) Nasopharyngeal Swab     Status: None   Collection Time: 07/06/21  3:44 PM   Specimen: Nasopharyngeal  Swab; Nasopharyngeal(NP) swabs in vial transport medium  Result Value Ref Range Status   SARS Coronavirus 2 by RT PCR NEGATIVE NEGATIVE Final    Comment: (NOTE) SARS-CoV-2 target nucleic acids are NOT DETECTED.  The SARS-CoV-2 RNA is generally detectable in upper respiratory specimens during the acute phase of infection. The lowest concentration of SARS-CoV-2 viral copies this assay can detect is 138 copies/mL. A negative result does not preclude SARS-Cov-2 infection and should not be used as the sole basis for treatment or other patient management decisions. A negative result may occur with  improper specimen collection/handling, submission of specimen other than nasopharyngeal swab, presence of viral mutation(s) within the areas targeted by this assay, and inadequate number of viral copies(<138 copies/mL). A negative result must be combined with clinical observations, patient history, and epidemiological information. The expected result is Negative.  Fact Sheet for Patients:  EntrepreneurPulse.com.au  Fact Sheet for Healthcare Providers:  IncredibleEmployment.be  This test is no t yet approved or cleared by the [place] FDA and  has been authorized for detection and/or diagnosis of SARS-CoV-2 by FDA  under an Emergency Use Authorization (EUA). This EUA will remain  in effect (meaning this test can be used) for the duration of the COVID-19 declaration under Section 564(b)(1) of the Act, 21 U.S.C.section 360bbb-3(b)(1), unless the authorization is terminated  or revoked sooner.       Influenza A by PCR NEGATIVE NEGATIVE Final   Influenza B by PCR NEGATIVE NEGATIVE Final    Comment: (NOTE) The Xpert Xpress SARS-CoV-2/FLU/RSV plus assay is intended as an aid in the diagnosis of influenza from Nasopharyngeal swab specimens and should not be used as a sole basis for treatment. Nasal washings and aspirates are unacceptable for Xpert Xpress  SARS-CoV-2/FLU/RSV testing.  Fact Sheet for Patients: EntrepreneurPulse.com.au  Fact Sheet for Healthcare Providers: IncredibleEmployment.be  This test is not yet approved or cleared by the [place] FDA and has been authorized for detection and/or diagnosis of SARS-CoV-2 by FDA under an Emergency Use Authorization (EUA). This EUA will remain in effect (meaning this test can be used) for the duration of the COVID-19 declaration under Section 564(b)(1) of the Act, 21 U.S.C. section 360bbb-3(b)(1), unless the authorization is terminated or revoked.  Performed at [facility], [place] [address] [place], [place] [address]    Radiology Studies: No results found.  Scheduled Meds:  dexamethasone  4 mg Oral TID   famotidine  20 mg Oral Daily   fluticasone furoate-vilanterol  1 puff Inhalation Daily   And   umeclidinium bromide  1 puff Inhalation Daily   insulin aspart  0-15 Units Subcutaneous TID WC   levETIRAcetam  500 mg Oral BID   lisinopril  5 mg Oral Daily   melatonin  3 mg Oral QHS   metFORMIN  1,000 mg Oral Q breakfast   tamsulosin  0.4 mg Oral QPC breakfast   Continuous Infusions:   LOS: 7 days    Time spent: 35 MINS.    Shawna Clamp, MD [facility]   If 7PM-7AM, please contact night-coverage

## 2021-07-14 NOTE — Progress Notes (Signed)
End of shift  Pt anticipated going to radiation appt today but he was not on the schedule.  Pt should go tomorrow, Dr advised and he said he would get the pt on the schedule tomorrow.   Pt complained of discomfort on his buttocks, it is red and blanchable.  Advised pt to not stay in the same spot and to roll side to side every 2 hours, pt able to turn himself.  Pt A&OX4, do not anticipate pt discharging until radiation appointments are completed.

## 2021-07-14 NOTE — Progress Notes (Signed)
Assumed care of patient from previous RN.  In to see pt.  Pt. Resting quietly.  All needs and concerns address.  Agree with above assessment.  No changes noted.  Will monitor.

## 2021-07-14 NOTE — Progress Notes (Signed)
Occupational Therapy Treatment Patient Details Name: Stephen Diaz MRN: [id number] DOB: November 30, 1955 Today's Date: 07/14/2021   History of present illness Pt is a 66 y.o. male returning to [facility] on 1/25 after discharge on 1/24 for recent admission for progressively worsening Lt sided weakness. CT scan of brain significant for 3 masses (Rt and Lt) up to 3 cm in diameter with vasogenic edema present mass effect on the lateral ventricles, but no midline shift. CT and MRI in ED negative for acute intracranial findings such as CVA. 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   OT comments  Therapy focused on neuromuscular reeducation to left upper extremity. Patient does exhibit some increase activation and ROM in left upper extremity today - specifically with elbow flexion, extension and shoulder flexion. Therapist provided positioning, muscle tapping and verbal cues to have patient activate movement and take through available ROM and reduce compensatory movement. Patient limited by abnormal muscle recruitment - specifically internal rotation which overwhelmed other movements. Patient educated on positioning of upper extremity in needed external rotation in bed as much as possible to counteract internal rotation. Patient tends to state "I can't" or "It won't move" and therapist had to once again educate patient on neurological principles, that he still may not grasp, to attempt initiation of movement over and over. Patient left in bed with lunch tray.   Recommendations for follow up therapy are one component of a multi-disciplinary discharge planning process, led by the attending physician.  Recommendations may be updated based on patient status, additional functional criteria and insurance authorization.    Follow Up Recommendations  Skilled nursing-short term rehab (<3  hours/day)    Assistance Recommended at Discharge Frequent or constant Supervision/Assistance  Patient can return home with the following  A lot of help with walking and/or transfers;A lot of help with bathing/dressing/bathroom;Assistance with cooking/housework   Equipment Recommendations  Other (comment) (defer to next venue)    Recommendations for Other Services      Precautions / Restrictions Precautions Precautions: Fall Precaution Comments: 6 falls in last month at least, pt re-admitted after being home for <1 hour due to fall Restrictions Weight Bearing Restrictions: No          Extremity/Trunk Assessment Upper Extremity Assessment LUE Deficits / Details: 07/14/21 in supine: No finger activation, no wrist activation, increased bicep/tricep ROM  and motor control (approx 2/5), poor to trace shoulder flexion (from 90 egrees), poor shoulder extension, 0/5 external rotation, 3-/5 internal roation             Cognition Arousal/Alertness: Awake/alert Behavior During Therapy: WFL for tasks assessed/performed Overall Cognitive Status: Within Functional Limits for tasks assessed                                                     Pertinent Vitals/ Pain       Pain Assessment Pain Assessment: Faces Faces Pain Scale: Hurts a little bit Pain Location: abrasions on left arm Pain Descriptors / Indicators: Sore Pain Intervention(s): Limited activity within patient's tolerance   Frequency  Min 3X/week        Progress Toward Goals  OT Goals(current goals can now be found in the care plan section)  Progress towards OT goals: Progressing  toward goals  Acute Rehab OT Goals Patient Stated Goal: to live on his own OT Goal Formulation: With patient Time For Goal Achievement: 07/21/21 Potential to Achieve Goals: [place] Discharge plan remains appropriate    Co-evaluation                 AM-PAC OT "6 Clicks" Daily Activity     Outcome  Measure   Help from another person eating meals?: A Little Help from another person taking care of personal grooming?: A Little Help from another person toileting, which includes using toliet, bedpan, or urinal?: A Lot Help from another person bathing (including washing, rinsing, drying)?: A Lot Help from another person to put on and taking off regular upper body clothing?: A Lot Help from another person to put on and taking off regular lower body clothing?: A Lot 6 Click Score: 14    End of Session    OT Visit Diagnosis: History of falling (Z91.81);Unsteadiness on feet (R26.81);Hemiplegia and hemiparesis Hemiplegia - Right/Left: Left Hemiplegia - dominant/non-dominant: Non-Dominant Hemiplegia - caused by: Unspecified   Activity Tolerance Patient tolerated treatment well   Patient Left in bed;with call bell/phone within reach;with bed alarm set   Nurse Communication  (okay to see)        Time: 2355-7322 OT Time Calculation (min): 14 min  Charges: OT General Charges $OT Visit: 1 Visit OT Treatments $Neuromuscular Re-education: 8-22 mins  Derl Barrow, OTR/L [place]  Office [phone] Pager: [place] 07/14/2021, 12:34 PM

## 2021-07-15 ENCOUNTER — Ambulatory Visit
Admission: RE | Admit: 2021-07-15 | Discharge: 2021-07-15 | Disposition: A | Discharge status: Home / Self Care | Payer: Medicare Other | Source: Ambulatory Visit | Attending: Radiation Oncology | Admitting: Radiation Oncology

## 2021-07-15 LAB — BASIC METABOLIC PANEL
Anion gap: 9 (ref 5–15)
BUN: 24 mg/dL — ABNORMAL HIGH (ref 8–23)
CO2: 24 mmol/L (ref 22–32)
Calcium: 8.7 mg/dL — ABNORMAL LOW (ref 8.9–10.3)
Chloride: 94 mmol/L — ABNORMAL LOW (ref 98–111)
Creatinine, Ser: 0.49 mg/dL — ABNORMAL LOW (ref 0.61–1.24)
GFR, Estimated: >60 mL/min (ref >60)
Glucose, Bld: 146 mg/dL — ABNORMAL HIGH (ref 70–99)
Potassium: 4.4 mmol/L (ref 3.5–5.1)
Sodium: 127 mmol/L — ABNORMAL LOW (ref 135–145)

## 2021-07-15 LAB — CBC
HCT: 42 % (ref 39.0–52.0)
Hemoglobin: 14.1 g/dL (ref 13.0–17.0)
MCH: 28.1 pg (ref 26.0–34.0)
MCHC: 33.6 g/dL (ref 30.0–36.0)
MCV: 83.7 fL (ref 80.0–100.0)
Platelets: 336 10*3/uL (ref 150–400)
RBC: 5.02 MIL/uL (ref 4.22–5.81)
RDW: 14.4 % (ref 11.5–15.5)
WBC: 18.8 10*3/uL — ABNORMAL HIGH (ref 4.0–10.5)
nRBC: 0 % (ref 0.0–0.2)

## 2021-07-15 LAB — GLUCOSE, CAPILLARY
Glucose-Capillary: 166 mg/dL — ABNORMAL HIGH (ref 70–99)
Glucose-Capillary: 330 mg/dL — ABNORMAL HIGH (ref 70–99)
Glucose-Capillary: 95 mg/dL (ref 70–99)
Glucose-Capillary: 221 mg/dL — ABNORMAL HIGH (ref 70–99)

## 2021-07-15 NOTE — Progress Notes (Addendum)
End of shift  Pt went to Radiation appointment today.  Pt is a 2 assist to the chair or bedside commode.  Pt had large BM.  Pt very sad today.  He was tearful about his condition.  Anticipate pt staying for several days for radiation treatment.  Pt experienced muscle spasms in his left arm today that lasted approx 5 minutes.

## 2021-07-15 NOTE — Plan of Care (Signed)

## 2021-07-15 NOTE — Progress Notes (Signed)
PROGRESS NOTE    Stephen Diaz  HQI:[id number] DOB: Mar 30, 1956 DOA: 07/06/2021 PCP: [name], PA-C    Brief Narrative:  This 66 year old male with metastatic lung cancer to the brain, COPD, DM 2, HTN who comes into the hospital with worsening left-sided weakness.  Imaging in the ER with an MRI showed frontal lobes metastatic disease with vasogenic edema, no bleeding or midline shift.  Dr. Loleta Books discussed with neurooncology and radiation oncology with plans for urgent radiation therapy likely on Monday 07/12/21.  Assessment & Plan:   Principal Problem:   Vasogenic edema (HCC) Active Problems:   Chronic obstructive pulmonary disease (HCC)   Type 2 diabetes mellitus (HCC)   Squamous cell carcinoma of lung, stage I, right (HCC)   Metastasis to brain [facility])   Essential hypertension   Dyslipidemia   Acute left-sided weakness   Pressure injury of ischium, stage 1   Brain metastases (HCC)  Left sided weakness due to brain metastasis with vasogenic edema: Patient presented with worsening left-sided weakness. He underwent EEG on admission which was unremarkable for seizures. He was empirically started on Keppra and Decadron. Continue Keppra and Decadron. Dr. Loleta Books discussed shortly after admission and plans are to start radiation therapy on 07/12/2021. Patient has radiation therapy started on 07/12/21,  reports slight improvement in his weakness. Palliative care discussed goals of care.  Continue ongoing discussions.  Stage IV lung cancer: Continue radiation treatment started on 07/12/2021. Palliative care consulted to discuss goals of care. Patient wants to follow-up with oncology to see if systemic therapy can be started. Follow-up radiation oncology about duration of radiation therapy.  Essential hypertension: Continue lisinopril.  Type 2 diabetes: Continue metformin, sliding scale. Continue steroids. Monitor CBG  COPD: Stable.  Continue home  inhalers.  BPH: Continue tamsulosin  Hyponatremia Likely due to brain mets. Repeat Sodium 129>127>127 >127 Continue gentle IV hydration.   DVT prophylaxis:  SCDs Code Status: DNR Family Communication:  No family at bed side. Disposition Plan:   Status is: Inpatient  Remains inpatient appropriate because:  Persistent left-sided weakness, requiring radiation therapy.  Consultants:  Radiation therapy, Palliative care  Procedures:  MRI Antimicrobials:  Anti-infectives (From admission, onward)    None       Subjective: Patient was seen and examined at bedside.  Overnight events noted. Patient had radiation treatment started on 07/12/2021.  He reports slight improvement in his strength. Patient is upset about not having radiation therapy yesterday. He is not able to move left arm but able to move left leg.     Objective: Vitals:   07/14/21 1125 07/14/21 2103 07/15/21 0640 07/15/21 0744  BP: 101/79 111/77 105/79   Pulse: 80 67 (!) 52   Resp: 18 18 18    Temp: 97.8 F (36.6 C) 97.7 F (36.5 C) (!) 97.4 F (36.3 C)   TempSrc: Oral Oral Oral   SpO2: 98% 97% 99% 98%  Weight:      Height:        Intake/Output Summary (Last 24 hours) at 07/15/2021 1229 Last data filed at 07/15/2021 0900 Gross per 24 hour  Intake 120 ml  Output --  Net 120 ml   Filed Weights   07/06/21 0356 07/06/21 2045  Weight: 79.4 kg 81.3 kg    Examination:  General exam: Appears deconditioned, comfortable, not in any distress. Respiratory system: Clear to auscultation bilaterally, respiratory effort normal, RR 16 Cardiovascular system: S1 & S2 heard, regular rate and rhythm, no murmur. Gastrointestinal system: Abdomen is soft,  nontender, nondistended, BS+ Central nervous system: Alert and oriented x3 . left-sided weakness, moves left leg, not able to move left arm. Extremities: No edema, no cyanosis, no clubbing. Skin: No rashes, lesions or ulcers Psychiatry: Judgement and insight appear  normal. Mood & affect appropriate.     Data Reviewed: I have personally reviewed following labs and imaging studies  CBC: Recent Labs  Lab 07/11/21 0435 07/12/21 0424 07/13/21 0447 07/14/21 0449 07/15/21 0436  WBC 14.1* 15.3* 15.7* 19.4* 18.8*  HGB 13.3 13.9 14.0 13.8 14.1  HCT 40.9 41.3 42.5 40.3 42.0  MCV 84.9 83.9 84.3 82.6 83.7  PLT 298 317 319 353 299   Basic Metabolic Panel: Recent Labs  Lab 07/11/21 0435 07/12/21 0424 07/13/21 0447 07/14/21 0449 07/15/21 0436  NA 129* 127* 127* 127* 127*  K 4.3 4.8 4.4 4.4 4.4  CL 95* 94* 93* 93* 94*  CO2 24 22 24 25 24   GLUCOSE 140* 161* 157* 159* 146*  BUN 25* 22 25* 27* 24*  CREATININE 0.56* 0.55* 0.54* 0.65 0.49*  CALCIUM 9.0 8.7* 8.9 8.9 8.7*  MG  --  2.1 2.1 1.9  --   PHOS  --  3.6 4.1 4.4  --    GFR: Estimated Creatinine Clearance: 102.6 mL/min (A) (by C-G formula based on SCr of 0.49 mg/dL (L)). Liver Function Tests: No results for input(s): AST, ALT, ALKPHOS, BILITOT, PROT, ALBUMIN in the last 168 hours.  No results for input(s): LIPASE, AMYLASE in the last 168 hours. No results for input(s): AMMONIA in the last 168 hours. Coagulation Profile: No results for input(s): INR, PROTIME in the last 168 hours. Cardiac Enzymes: No results for input(s): CKTOTAL, CKMB, CKMBINDEX, TROPONINI in the last 168 hours. BNP (last 3 results) No results for input(s): PROBNP in the last 8760 hours. HbA1C: No results for input(s): HGBA1C in the last 72 hours. CBG: Recent Labs  Lab 07/14/21 1122 07/14/21 1600 07/14/21 2306 07/15/21 0755 07/15/21 1135  GLUCAP 225* 135* 259* 166* 330*   Lipid Profile: No results for input(s): CHOL, HDL, LDLCALC, TRIG, CHOLHDL, LDLDIRECT in the last 72 hours. Thyroid Function Tests: No results for input(s): TSH, T4TOTAL, FREET4, T3FREE, THYROIDAB in the last 72 hours. Anemia Panel: No results for input(s): VITAMINB12, FOLATE, FERRITIN, TIBC, IRON, RETICCTPCT in the last 72 hours. Sepsis  Labs: No results for input(s): PROCALCITON, LATICACIDVEN in the last 168 hours.  Recent Results (from the past 240 hour(s))  Resp Panel by RT-PCR (Flu A&B, Covid) Nasopharyngeal Swab     Status: None   Collection Time: 07/06/21  3:44 PM   Specimen: Nasopharyngeal Swab; Nasopharyngeal(NP) swabs in vial transport medium  Result Value Ref Range Status   SARS Coronavirus 2 by RT PCR NEGATIVE NEGATIVE Final    Comment: (NOTE) SARS-CoV-2 target nucleic acids are NOT DETECTED.  The SARS-CoV-2 RNA is generally detectable in upper respiratory specimens during the acute phase of infection. The lowest concentration of SARS-CoV-2 viral copies this assay can detect is 138 copies/mL. A negative result does not preclude SARS-Cov-2 infection and should not be used as the sole basis for treatment or other patient management decisions. A negative result may occur with  improper specimen collection/handling, submission of specimen other than nasopharyngeal swab, presence of viral mutation(s) within the areas targeted by this assay, and inadequate number of viral copies(<138 copies/mL). A negative result must be combined with clinical observations, patient history, and epidemiological information. The expected result is Negative.  Fact Sheet for Patients:  EntrepreneurPulse.com.au  Fact  Sheet for Healthcare Providers:  IncredibleEmployment.be  This test is no t yet approved or cleared by the [place] FDA and  has been authorized for detection and/or diagnosis of SARS-CoV-2 by FDA under an Emergency Use Authorization (EUA). This EUA will remain  in effect (meaning this test can be used) for the duration of the COVID-19 declaration under Section 564(b)(1) of the Act, 21 U.S.C.section 360bbb-3(b)(1), unless the authorization is terminated  or revoked sooner.       Influenza A by PCR NEGATIVE NEGATIVE Final   Influenza B by PCR NEGATIVE NEGATIVE Final     Comment: (NOTE) The Xpert Xpress SARS-CoV-2/FLU/RSV plus assay is intended as an aid in the diagnosis of influenza from Nasopharyngeal swab specimens and should not be used as a sole basis for treatment. Nasal washings and aspirates are unacceptable for Xpert Xpress SARS-CoV-2/FLU/RSV testing.  Fact Sheet for Patients: EntrepreneurPulse.com.au  Fact Sheet for Healthcare Providers: IncredibleEmployment.be  This test is not yet approved or cleared by the [place] FDA and has been authorized for detection and/or diagnosis of SARS-CoV-2 by FDA under an Emergency Use Authorization (EUA). This EUA will remain in effect (meaning this test can be used) for the duration of the COVID-19 declaration under Section 564(b)(1) of the Act, 21 U.S.C. section 360bbb-3(b)(1), unless the authorization is terminated or revoked.  Performed at [facility], [place] [address], [place], [place] [address]    Radiology Studies: No results found.  Scheduled Meds:  dexamethasone  4 mg Oral TID   famotidine  20 mg Oral Daily   fluticasone furoate-vilanterol  1 puff Inhalation Daily   And   umeclidinium bromide  1 puff Inhalation Daily   insulin aspart  0-15 Units Subcutaneous TID WC   levETIRAcetam  500 mg Oral BID   lisinopril  5 mg Oral Daily   melatonin  3 mg Oral QHS   metFORMIN  1,000 mg Oral Q breakfast   tamsulosin  0.4 mg Oral QPC breakfast   Continuous Infusions:   LOS: 8 days    Time spent: 35 MINS.    Shawna Clamp, MD [facility]   If 7PM-7AM, please contact night-coverage

## 2021-07-15 NOTE — Progress Notes (Signed)
Physical Therapy Treatment Patient Details Name: Stephen Diaz MRN: [id number] DOB: April 25, 1956 Today's Date: 07/15/2021   History of Present Illness Pt is a 66 y.o. male returning to [facility] on 1/25 after discharge on 1/24 for recent admission for progressively worsening Lt sided weakness. CT scan of brain significant for 3 masses (Rt and Lt) up to 3 cm in diameter with vasogenic edema present mass effect on the lateral ventricles, but no midline shift. CT and MRI in ED negative for acute intracranial findings such as CVA. 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    PT Comments    General Comments: AxO x 4 very aware and knowledgable about his current medical status. Assisted to EOB.  General bed mobility comments: assist for upper due to non functioning L UE and use of bed pad to complete scooting.  Sitting balance is good. General transfer comment: With Therapst sitting in a straight chair in front of pt, performed several sit to stands with static and dynamic lateral shift side to side while stabelizing knee and facilitating hip stabelizers.  Pt able to support self on R LE and hold to back of recliner chair with R UE.  Performed 4 times each stance time approx 2 min each before c/o fatigue.  Then assisted to recliner stand pivot sit towards pt's RIGHT with Min Assist to complete pivot and control decend. Also performed L LE AAROM TE's of Hip Flex, LAQ's.  Positioned in recliner to comfort.    Recommendations for follow up therapy are one component of a multi-disciplinary discharge planning process, led by the attending physician.  Recommendations may be updated based on patient status, additional functional criteria and insurance authorization.  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  Sitting balance is good.    Transfers Overall transfer level: Needs assistance Equipment used: 1 person hand held assist Transfers: Sit to/from Stand, Bed to chair/wheelchair/BSC Sit to Stand: Mod assist Stand pivot transfers: Mod assist         General transfer comment: With Therapst sitting in a straight chair in front of pt, performed several sit to stands with static and dynamic lateral shift side to side while stabelizing knee and facilitating hip stabelizers.  Pt able to support self on R LE and hold to back of recliner chair with R UE.  Performed 4 times each stance time approx 2 min each before c/o fatigue.  Then assisted to recliner stand pivot sit towards pt's RIGHT with Min Assist to complete pivot and control decend.    Ambulation/Gait  General Gait Details: performed pre gait static and dynamic standing balance activity   Stairs             Wheelchair Mobility    Modified Rankin (Stroke Patients Only)       Balance                                            Cognition Arousal/Alertness: Awake/alert                                     General Comments:  AxO x 4 very aware and knowledgable about his current medical status.        Exercises      General Comments        Pertinent Vitals/Pain Pain Assessment Pain Assessment: No/denies pain    Home Living                          Prior Function            PT Goals (current goals can now be found in the care plan section) Progress towards PT goals: Progressing toward goals    Frequency    Min 3X/week      PT Plan Current plan remains appropriate;Equipment recommendations need to be updated    Co-evaluation              AM-PAC PT "6 Clicks" Mobility   Outcome Measure  Help needed turning from your back to your side while in a flat bed without using bedrails?: A Lot Help needed moving from lying on your back to sitting on the side of a flat bed without using bedrails?: A Lot Help needed moving to and from a bed to a chair (including a wheelchair)?: A Lot Help needed standing up from a chair using your arms (e.g., wheelchair or bedside chair)?: A Lot Help needed to walk in hospital room?: Total Help needed climbing 3-5 steps with a railing? : Total 6 Click Score: 10    End of Session Equipment Utilized During Treatment: Gait belt Activity Tolerance: Patient tolerated treatment well Patient left: with call bell/phone within reach;in chair;with chair alarm set Nurse Communication: Mobility status PT Visit Diagnosis: Difficulty in walking, not elsewhere classified (R26.2);Unsteadiness on feet (R26.81);Hemiplegia and hemiparesis Hemiplegia - Right/Left: Left     Time: 6004-5997 PT Time Calculation (min) (ACUTE ONLY): 31 min  Charges:  $Therapeutic Activity: 8-22 mins $Neuromuscular Re-education: 8-22 mins                    Rica Koyanagi  PTA Acute  Rehabilitation Services Pager      [phone] Office      [phone]

## 2021-07-16 LAB — CBC
HCT: 41 % (ref 39.0–52.0)
Hemoglobin: 13.7 g/dL (ref 13.0–17.0)
MCH: 27.8 pg (ref 26.0–34.0)
MCHC: 33.4 g/dL (ref 30.0–36.0)
MCV: 83.2 fL (ref 80.0–100.0)
Platelets: 338 10*3/uL (ref 150–400)
RBC: 4.93 MIL/uL (ref 4.22–5.81)
RDW: 14.6 % (ref 11.5–15.5)
WBC: 20.7 10*3/uL — ABNORMAL HIGH (ref 4.0–10.5)
nRBC: 0 % (ref 0.0–0.2)

## 2021-07-16 LAB — GLUCOSE, CAPILLARY
Glucose-Capillary: 130 mg/dL — ABNORMAL HIGH (ref 70–99)
Glucose-Capillary: 143 mg/dL — ABNORMAL HIGH (ref 70–99)
Glucose-Capillary: 251 mg/dL — ABNORMAL HIGH (ref 70–99)
Glucose-Capillary: 126 mg/dL — ABNORMAL HIGH (ref 70–99)

## 2021-07-16 NOTE — Plan of Care (Signed)
°  Problem: Education: Goal: Knowledge of General Education information will improve Description: Including pain rating scale, medication(s)/side effects and non-pharmacologic comfort measures Outcome: Progressing   Problem: Clinical Measurements: Goal: Will remain free from infection Outcome: Progressing   Problem: Activity: Goal: Risk for activity intolerance will decrease Outcome: Progressing   Problem: Coping: Goal: Level of anxiety will decrease Outcome: Progressing

## 2021-07-16 NOTE — Progress Notes (Signed)
Occupational Therapy Treatment Patient Details Name: Stephen Diaz MRN: [id number] DOB: 02/05/1956 Today's Date: 07/16/2021   History of present illness Pt is a 66 y.o. male returning to [facility] on 1/25 after discharge on 1/24 for recent admission for progressively worsening Lt sided weakness. CT scan of brain significant for 3 masses (Rt and Lt) up to 3 cm in diameter with vasogenic edema present mass effect on the lateral ventricles, but no midline shift. CT and MRI in ED negative for acute intracranial findings such as CVA. 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   OT comments  Patient reported having sat up in recliner earlier on this date. Patients session focused on proper positioning for sit to stands from EOB to reduce assistance needed for toileting tasks. Patient was min A for first half of session and progressed to mod A with increased cues with fatigue. Patient was positioned in chair position in bed at end of session. Patient would continue to benefit from skilled OT services at this time while admitted and after d/c to address noted deficits in order to improve overall safety and independence in ADLs.     Recommendations for follow up therapy are one component of a multi-disciplinary discharge planning process, led by the attending physician.  Recommendations may be updated based on patient status, additional functional criteria and insurance authorization.    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 Restrictions Weight Bearing Restrictions: No       Mobility Bed Mobility                    Transfers                         Balance                                           ADL either performed or assessed with clinical judgement   ADL                                              Extremity/Trunk Assessment              Vision       Perception     Praxis      Cognition Arousal/Alertness: Awake/alert Behavior During Therapy: WFL for tasks assessed/performed Overall Cognitive Status: Within Functional Limits for tasks assessed                                 General Comments: eager to participate in session        Exercises Other Exercises Other Exercises: patient participated in sit to stands  at edge of bed with about 10 reps with education on keeping BLE shoulder width apart. patietn was min A progressing to mOD A with leaning to L side with increased time. Other Exercises: patient was able to stand with one UE support with encouragement to WB through LLE with noted flexion and leaning with WB. patietn needed miN A to maintain balance.    Shoulder Instructions       General Comments      Pertinent Vitals/ Pain       Pain Assessment Pain Assessment: No/denies pain  Home Living                                          Prior Functioning/Environment              Frequency  Min 3X/week        Progress Toward Goals  OT Goals(current goals can now be found in the care plan section)  Progress towards OT goals: Progressing toward goals     Plan Discharge plan remains appropriate    Co-evaluation                 AM-PAC OT "6 Clicks" Daily Activity     Outcome Measure   Help from another person eating meals?: A Little Help  from another person taking care of personal grooming?: A Little Help from another person toileting, which includes using toliet, bedpan, or urinal?: A Lot Help from another person bathing (including washing, rinsing, drying)?: A Lot Help from another person to put on and taking off regular upper body clothing?: A Lot Help from another person to put on and taking off regular lower body clothing?: A Lot 6 Click Score: 14    End of Session Equipment Utilized During Treatment: Gait belt;Rolling walker (2 wheels)  OT Visit Diagnosis: History of falling (Z91.81);Unsteadiness on feet (R26.81);Hemiplegia and hemiparesis Hemiplegia - Right/Left: Left Hemiplegia - dominant/non-dominant: Non-Dominant Hemiplegia - caused by: Unspecified   Activity Tolerance Patient tolerated treatment well   Patient Left in bed;with call bell/phone within reach;with bed alarm set   Nurse Communication Mobility status        Time: 1534-1550 OT Time Calculation (min): 16 min  Charges: OT General Charges $OT Visit: 1 Visit OT Treatments $Therapeutic Activity: 8-22 mins  Jackelyn Poling OTR/L, MS Acute Rehabilitation Department Office# [phone] Pager# [phone]   [name] 07/16/2021, 3:54 PM

## 2021-07-16 NOTE — Plan of Care (Signed)

## 2021-07-16 NOTE — Progress Notes (Signed)
PROGRESS NOTE    Stephen Diaz  JJO:[id number] DOB: Sep 11, 1955 DOA: 07/06/2021 PCP: [name], PA-C    Brief Narrative:  This 66 year old male with metastatic lung cancer to the brain, COPD, DM 2, HTN who comes into the hospital with worsening left-sided weakness.  Imaging in the ER with an MRI showed frontal lobes metastatic disease with vasogenic edema, no bleeding or midline shift. Dr. Loleta Books discussed with neurooncology and radiation oncology with plans for urgent radiation therapy likely on Monday 07/12/21.  Assessment & Plan:   Principal Problem:   Vasogenic edema (HCC) Active Problems:   Chronic obstructive pulmonary disease (HCC)   Type 2 diabetes mellitus (HCC)   Squamous cell carcinoma of lung, stage I, right (HCC)   Metastasis to brain [facility])   Essential hypertension   Dyslipidemia   Acute left-sided weakness   Pressure injury of ischium, stage 1   Brain metastases (HCC)  Left sided weakness due to brain metastasis with vasogenic edema: Patient presented with worsening left-sided weakness. He underwent EEG on admission which was unremarkable for seizures. He was empirically started on Keppra and Decadron. Continue Keppra and Decadron. Dr. Loleta Books discussed shortly after admission and plans are to start radiation therapy on 07/12/2021. Patient has radiation therapy started on 07/12/21,  reports slight improvement in his weakness. Palliative care discussed goals of care.  Continue ongoing discussions. There is slight improvement in left sided weakness.  Stage IV lung cancer: Continue radiation treatment started on 07/12/2021. Palliative care consulted to discuss goals of care. Patient wants to follow-up with oncology to see if systemic therapy can be started. Follow-up radiation oncology about duration of radiation therapy.  Essential hypertension: Continue lisinopril.  Type 2 diabetes: Continue metformin, sliding scale. Continue steroids. Monitor  CBG  COPD: Stable.  Continue home inhalers.  BPH: Continue tamsulosin  Hyponatremia Likely due to brain mets. Repeat Sodium 129>127>127 >127 Continue gentle IV hydration.  Leukocytosis: Could be secondary to steroids, no signs of infection.   DVT prophylaxis:  SCDs Code Status: DNR Family Communication:  No family at bed side. Disposition Plan:   Status is: Inpatient  Remains inpatient appropriate because:  Persistent left-sided weakness, requiring radiation therapy.  Consultants:  Radiation therapy, Palliative care  Procedures:  MRI Antimicrobials:  Anti-infectives (From admission, onward)    None       Subjective: Patient was seen and examined at bedside.  Overnight events noted. Patient had radiation treatment started on 07/12/2021.  He reports slight improvement in his leg weakness. He is not able to move left arm but able to move left leg.  He is using his right arm to lift left arm.   Objective: Vitals:   07/15/21 1322 07/15/21 2109 07/16/21 0432 07/16/21 0755  BP: 111/74 93/72 101/65   Pulse: 69 61 67   Resp: 16 20 20    Temp:  98.1 F (36.7 C) 97.7 F (36.5 C)   TempSrc:  Oral Oral   SpO2: 100% 100% 98% 97%  Weight:      Height:        Intake/Output Summary (Last 24 hours) at 07/16/2021 1201 Last data filed at 07/16/2021 0900 Gross per 24 hour  Intake 600 ml  Output 375 ml  Net 225 ml   Filed Weights   07/06/21 0356 07/06/21 2045  Weight: 79.4 kg 81.3 kg    Examination:  General exam:  Appears deconditioned, not in any distress, comfortable. Respiratory system: Clear to auscultation bilaterally, respiratory effort normal, RR 14 Cardiovascular  system: S1 & S2 heard, regular rate and rhythm, no murmur. Gastrointestinal system: Abdomen is soft, non tender, nondistended, BS+ Central nervous system: Alert and oriented x3 . left-sided weakness, moves left leg, not able to move left arm. Extremities: No edema, no cyanosis, no clubbing. Skin:  No rashes, lesions or ulcers Psychiatry: Judgement and insight appear normal. Mood & affect appropriate.     Data Reviewed: I have personally reviewed following labs and imaging studies  CBC: Recent Labs  Lab 07/12/21 0424 07/13/21 0447 07/14/21 0449 07/15/21 0436 07/16/21 0558  WBC 15.3* 15.7* 19.4* 18.8* 20.7*  HGB 13.9 14.0 13.8 14.1 13.7  HCT 41.3 42.5 40.3 42.0 41.0  MCV 83.9 84.3 82.6 83.7 83.2  PLT 317 319 353 336 893   Basic Metabolic Panel: Recent Labs  Lab 07/11/21 0435 07/12/21 0424 07/13/21 0447 07/14/21 0449 07/15/21 0436  NA 129* 127* 127* 127* 127*  K 4.3 4.8 4.4 4.4 4.4  CL 95* 94* 93* 93* 94*  CO2 24 22 24 25 24   GLUCOSE 140* 161* 157* 159* 146*  BUN 25* 22 25* 27* 24*  CREATININE 0.56* 0.55* 0.54* 0.65 0.49*  CALCIUM 9.0 8.7* 8.9 8.9 8.7*  MG  --  2.1 2.1 1.9  --   PHOS  --  3.6 4.1 4.4  --    GFR: Estimated Creatinine Clearance: 102.6 mL/min (A) (by C-G formula based on SCr of 0.49 mg/dL (L)). Liver Function Tests: No results for input(s): AST, ALT, ALKPHOS, BILITOT, PROT, ALBUMIN in the last 168 hours.  No results for input(s): LIPASE, AMYLASE in the last 168 hours. No results for input(s): AMMONIA in the last 168 hours. Coagulation Profile: No results for input(s): INR, PROTIME in the last 168 hours. Cardiac Enzymes: No results for input(s): CKTOTAL, CKMB, CKMBINDEX, TROPONINI in the last 168 hours. BNP (last 3 results) No results for input(s): PROBNP in the last 8760 hours. HbA1C: No results for input(s): HGBA1C in the last 72 hours. CBG: Recent Labs  Lab 07/15/21 1135 07/15/21 1605 07/15/21 2104 07/16/21 0801 07/16/21 1158  GLUCAP 330* 95 221* 130* 143*   Lipid Profile: No results for input(s): CHOL, HDL, LDLCALC, TRIG, CHOLHDL, LDLDIRECT in the last 72 hours. Thyroid Function Tests: No results for input(s): TSH, T4TOTAL, FREET4, T3FREE, THYROIDAB in the last 72 hours. Anemia Panel: No results for input(s): VITAMINB12,  FOLATE, FERRITIN, TIBC, IRON, RETICCTPCT in the last 72 hours. Sepsis Labs: No results for input(s): PROCALCITON, LATICACIDVEN in the last 168 hours.  Recent Results (from the past 240 hour(s))  Resp Panel by RT-PCR (Flu A&B, Covid) Nasopharyngeal Swab     Status: None   Collection Time: 07/06/21  3:44 PM   Specimen: Nasopharyngeal Swab; Nasopharyngeal(NP) swabs in vial transport medium  Result Value Ref Range Status   SARS Coronavirus 2 by RT PCR NEGATIVE NEGATIVE Final    Comment: (NOTE) SARS-CoV-2 target nucleic acids are NOT DETECTED.  The SARS-CoV-2 RNA is generally detectable in upper respiratory specimens during the acute phase of infection. The lowest concentration of SARS-CoV-2 viral copies this assay can detect is 138 copies/mL. A negative result does not preclude SARS-Cov-2 infection and should not be used as the sole basis for treatment or other patient management decisions. A negative result may occur with  improper specimen collection/handling, submission of specimen other than nasopharyngeal swab, presence of viral mutation(s) within the areas targeted by this assay, and inadequate number of viral copies(<138 copies/mL). A negative result must be combined with clinical observations, patient history,  and epidemiological information. The expected result is Negative.  Fact Sheet for Patients:  EntrepreneurPulse.com.au  Fact Sheet for Healthcare Providers:  IncredibleEmployment.be  This test is no t yet approved or cleared by the [place] FDA and  has been authorized for detection and/or diagnosis of SARS-CoV-2 by FDA under an Emergency Use Authorization (EUA). This EUA will remain  in effect (meaning this test can be used) for the duration of the COVID-19 declaration under Section 564(b)(1) of the Act, 21 U.S.C.section 360bbb-3(b)(1), unless the authorization is terminated  or revoked sooner.       Influenza A by PCR  NEGATIVE NEGATIVE Final   Influenza B by PCR NEGATIVE NEGATIVE Final    Comment: (NOTE) The Xpert Xpress SARS-CoV-2/FLU/RSV plus assay is intended as an aid in the diagnosis of influenza from Nasopharyngeal swab specimens and should not be used as a sole basis for treatment. Nasal washings and aspirates are unacceptable for Xpert Xpress SARS-CoV-2/FLU/RSV testing.  Fact Sheet for Patients: EntrepreneurPulse.com.au  Fact Sheet for Healthcare Providers: IncredibleEmployment.be  This test is not yet approved or cleared by the [place] FDA and has been authorized for detection and/or diagnosis of SARS-CoV-2 by FDA under an Emergency Use Authorization (EUA). This EUA will remain in effect (meaning this test can be used) for the duration of the COVID-19 declaration under Section 564(b)(1) of the Act, 21 U.S.C. section 360bbb-3(b)(1), unless the authorization is terminated or revoked.  Performed at [facility], [place] [address] [place], [place] [address]    Radiology Studies: No results found.  Scheduled Meds:  dexamethasone  4 mg Oral TID   famotidine  20 mg Oral Daily   fluticasone furoate-vilanterol  1 puff Inhalation Daily   And   umeclidinium bromide  1 puff Inhalation Daily   insulin aspart  0-15 Units Subcutaneous TID WC   levETIRAcetam  500 mg Oral BID   lisinopril  5 mg Oral Daily   melatonin  3 mg Oral QHS   metFORMIN  1,000 mg Oral Q breakfast   tamsulosin  0.4 mg Oral QPC breakfast   Continuous Infusions:   LOS: 9 days    Time spent: 35 MINS.    Shawna Clamp, MD [facility]   If 7PM-7AM, please contact night-coverage

## 2021-07-16 NOTE — Progress Notes (Addendum)
End of shift  Pt concerned about leaving the hospital and still needing radiation.  Wants [place] to "get all of it" before he is discharged to SNF/Rehab.  Pt up in chair for breakfast for an hour.  Spent the rest of the day in bed except when working with PT.    Pt's movement of left leg has greatly improved within the last 24 hours.  Still unable to move the Left arm or hand.

## 2021-07-17 LAB — CBC
HCT: 41.8 % (ref 39.0–52.0)
Hemoglobin: 13.8 g/dL (ref 13.0–17.0)
MCH: 27.4 pg (ref 26.0–34.0)
MCHC: 33 g/dL (ref 30.0–36.0)
MCV: 82.9 fL (ref 80.0–100.0)
Platelets: 364 10*3/uL (ref 150–400)
RBC: 5.04 MIL/uL (ref 4.22–5.81)
RDW: 14.6 % (ref 11.5–15.5)
WBC: 22.2 10*3/uL — ABNORMAL HIGH (ref 4.0–10.5)
nRBC: 0 % (ref 0.0–0.2)

## 2021-07-17 LAB — BASIC METABOLIC PANEL
Anion gap: 11 (ref 5–15)
BUN: 23 mg/dL (ref 8–23)
CO2: 24 mmol/L (ref 22–32)
Calcium: 8.6 mg/dL — ABNORMAL LOW (ref 8.9–10.3)
Chloride: 90 mmol/L — ABNORMAL LOW (ref 98–111)
Creatinine, Ser: 0.57 mg/dL — ABNORMAL LOW (ref 0.61–1.24)
GFR, Estimated: >60 mL/min (ref >60)
Glucose, Bld: 129 mg/dL — ABNORMAL HIGH (ref 70–99)
Potassium: 4 mmol/L (ref 3.5–5.1)
Sodium: 125 mmol/L — ABNORMAL LOW (ref 135–145)

## 2021-07-17 LAB — GLUCOSE, CAPILLARY
Glucose-Capillary: 133 mg/dL — ABNORMAL HIGH (ref 70–99)
Glucose-Capillary: 170 mg/dL — ABNORMAL HIGH (ref 70–99)
Glucose-Capillary: 198 mg/dL — ABNORMAL HIGH (ref 70–99)
Glucose-Capillary: 224 mg/dL — ABNORMAL HIGH (ref 70–99)

## 2021-07-17 LAB — PHOSPHORUS: Phosphorus: 4.1 mg/dL (ref 2.5–4.6)

## 2021-07-17 LAB — MAGNESIUM: Magnesium: 1.9 mg/dL (ref 1.7–2.4)

## 2021-07-17 MED ORDER — SODIUM CHLORIDE 1 G PO TABS
1.0000 g | ORAL_TABLET | Freq: Three times a day (TID) | ORAL | Status: DC
  Administered 2021-07-17 – 2021-07-19 (×8): 1 g via ORAL
  Filled 2021-07-17 (×8): qty 1

## 2021-07-17 NOTE — Progress Notes (Signed)
PROGRESS NOTE    Stephen Diaz  TWS:[id number] DOB: 01/30/1956 DOA: 07/06/2021 PCP: [name], PA-C    Brief Narrative:  This 66 year old male with metastatic lung cancer to the brain, COPD, DM 2, HTN who comes into the hospital with worsening left-sided weakness.  Imaging in the ER with an MRI showed frontal lobes metastatic disease with vasogenic edema, no bleeding or midline shift. Dr. Loleta Books discussed with neurooncology and radiation oncology with plans for urgent radiation therapy likely on Monday 07/12/21.  Assessment & Plan:   Principal Problem:   Vasogenic edema (HCC) Active Problems:   Chronic obstructive pulmonary disease (HCC)   Type 2 diabetes mellitus (HCC)   Squamous cell carcinoma of lung, stage I, right (HCC)   Metastasis to brain [facility])   Essential hypertension   Dyslipidemia   Acute left-sided weakness   Pressure injury of ischium, stage 1   Brain metastases (HCC)  Left sided weakness due to brain metastasis with vasogenic edema: Patient presented with worsening left-sided weakness. He underwent EEG on admission which was unremarkable for seizures. He was empirically started on Keppra and Decadron. Continue Keppra and Decadron. It was discussed with radiation oncology shortly after admission and plans are to start radiation therapy on 07/12/2021. Patient has radiation therapy started on 07/12/21,  reports improvement in his weakness. Palliative care discussed goals of care.  Continue ongoing discussions. There is slight improvement in left sided weakness.  Stage IV lung cancer: Continue radiation treatment started on 07/12/2021. Palliative care consulted to discuss goals of care. Patient wants to follow-up with oncology to see if systemic therapy can be started. Follow-up radiation oncology about duration of radiation therapy.  Essential hypertension: Continue lisinopril.  Type 2 diabetes: Continue metformin, sliding scale. Continue steroids.  Monitor CBG  COPD: Stable.  Continue home inhalers.  BPH: Continue tamsulosin  Hyponatremia Likely due to brain mets. Repeat Sodium 129>127>127 >127 Continue gentle IV hydration. Start salt tablets tid.  Leukocytosis: Could be secondary to steroids, no signs of infection.   DVT prophylaxis:  SCDs Code Status: DNR Family Communication:  No family at bed side. Disposition Plan:   Status is: Inpatient  Remains inpatient appropriate because:  Persistent left-sided weakness, requiring radiation therapy.  Consultants:  Radiation therapy, Palliative care  Procedures:  MRI Antimicrobials:  Anti-infectives (From admission, onward)    None       Subjective: Patient was seen and examined at bedside.  Overnight events noted. Patient had radiation treatment started on 07/12/2021.  He reports slight improvement in his leg weakness. He is not able to move left arm but able to move left leg.  He is using his right arm to lift left arm.   Objective: Vitals:   07/16/21 1355 07/16/21 2118 07/17/21 0456 07/17/21 0810  BP: 91/70 102/68 105/75   Pulse: 72 60 (!) 56   Resp: 18 18 18    Temp: 97.7 F (36.5 C) 97.6 F (36.4 C) (!) 97.5 F (36.4 C)   TempSrc: Oral Oral Oral   SpO2: 98% 98% 97% 95%  Weight:      Height:        Intake/Output Summary (Last 24 hours) at 07/17/2021 1341 Last data filed at 07/17/2021 0900 Gross per 24 hour  Intake 120 ml  Output 1050 ml  Net -930 ml   Filed Weights   07/06/21 0356 07/06/21 2045  Weight: 79.4 kg 81.3 kg    Examination:  General exam: Appears comfortable, deconditioned, not in any acute distress. Respiratory  system: Clear to auscultation bilaterally, respiratory effort normal, RR 13  Cardiovascular system: S1 & S2 heard, regular rate and rhythm, no murmur. Gastrointestinal system: Abdomen is soft, non tender, nondistended, BS+ Central nervous system: Alert and oriented x3 . left-sided weakness, moves left leg, not able to move  left arm. Extremities: No edema, no cyanosis, no clubbing. Skin: No rashes, lesions or ulcers Psychiatry: Judgement and insight appear normal. Mood & affect appropriate.     Data Reviewed: I have personally reviewed following labs and imaging studies  CBC: Recent Labs  Lab 07/13/21 0447 07/14/21 0449 07/15/21 0436 07/16/21 0558 07/17/21 0535  WBC 15.7* 19.4* 18.8* 20.7* 22.2*  HGB 14.0 13.8 14.1 13.7 13.8  HCT 42.5 40.3 42.0 41.0 41.8  MCV 84.3 82.6 83.7 83.2 82.9  PLT 319 353 336 338 409   Basic Metabolic Panel: Recent Labs  Lab 07/12/21 0424 07/13/21 0447 07/14/21 0449 07/15/21 0436 07/17/21 0535  NA 127* 127* 127* 127* 125*  K 4.8 4.4 4.4 4.4 4.0  CL 94* 93* 93* 94* 90*  CO2 22 24 25 24 24   GLUCOSE 161* 157* 159* 146* 129*  BUN 22 25* 27* 24* 23  CREATININE 0.55* 0.54* 0.65 0.49* 0.57*  CALCIUM 8.7* 8.9 8.9 8.7* 8.6*  MG 2.1 2.1 1.9  --  1.9  PHOS 3.6 4.1 4.4  --  4.1   GFR: Estimated Creatinine Clearance: 102.6 mL/min (A) (by C-G formula based on SCr of 0.57 mg/dL (L)). Liver Function Tests: No results for input(s): AST, ALT, ALKPHOS, BILITOT, PROT, ALBUMIN in the last 168 hours.  No results for input(s): LIPASE, AMYLASE in the last 168 hours. No results for input(s): AMMONIA in the last 168 hours. Coagulation Profile: No results for input(s): INR, PROTIME in the last 168 hours. Cardiac Enzymes: No results for input(s): CKTOTAL, CKMB, CKMBINDEX, TROPONINI in the last 168 hours. BNP (last 3 results) No results for input(s): PROBNP in the last 8760 hours. HbA1C: No results for input(s): HGBA1C in the last 72 hours. CBG: Recent Labs  Lab 07/16/21 1158 07/16/21 1714 07/16/21 2114 07/17/21 0744 07/17/21 1123  GLUCAP 143* 251* 126* 133* 170*   Lipid Profile: No results for input(s): CHOL, HDL, LDLCALC, TRIG, CHOLHDL, LDLDIRECT in the last 72 hours. Thyroid Function Tests: No results for input(s): TSH, T4TOTAL, FREET4, T3FREE, THYROIDAB in the last  72 hours. Anemia Panel: No results for input(s): VITAMINB12, FOLATE, FERRITIN, TIBC, IRON, RETICCTPCT in the last 72 hours. Sepsis Labs: No results for input(s): PROCALCITON, LATICACIDVEN in the last 168 hours.  No results found for this or any previous visit (from the past 240 hour(s)).  Radiology Studies: No results found.  Scheduled Meds:  dexamethasone  4 mg Oral TID   famotidine  20 mg Oral Daily   fluticasone furoate-vilanterol  1 puff Inhalation Daily   And   umeclidinium bromide  1 puff Inhalation Daily   insulin aspart  0-15 Units Subcutaneous TID WC   levETIRAcetam  500 mg Oral BID   lisinopril  5 mg Oral Daily   melatonin  3 mg Oral QHS   metFORMIN  1,000 mg Oral Q breakfast   sodium chloride  1 g Oral TID WC   tamsulosin  0.4 mg Oral QPC breakfast   Continuous Infusions:   LOS: 10 days    Time spent: 35 MINS.    Shawna Clamp, MD [facility]   If 7PM-7AM, please contact night-coverage

## 2021-07-17 NOTE — Progress Notes (Signed)
Physical Therapy Treatment Patient Details Name: Stephen Diaz MRN: [id number] DOB: 12-19-1955 Today's Date: 07/17/2021   History of Present Illness Pt is a 66 y.o. male returning to [facility] on 1/25 after discharge on 1/24 for recent admission for progressively worsening Lt sided weakness. CT scan of brain significant for 3 masses (Rt and Lt) up to 3 cm in diameter with vasogenic edema present mass effect on the lateral ventricles, but no midline shift. CT and MRI in ED negative for acute intracranial findings such as CVA. 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    PT Comments    General Comments: AxO x 3 very aware of his current medical status and prognosis but still very hopeful he will regain mobility as he continue his Radiation.  He also commented, he may "never be right again". Assisted OOB.  General bed mobility comments: Min Assist for upper body and Mod Assist to complete scooting to EOB.  Good trunk stability.  Able to sit EOB Indep.  Unable to functionally use L UE (flaccid).  Has a sling for OOB support.General transfer comment: first perfromed pre gait dynamic standing at bed side with Therapist in front blocking L knee while pt performed lateral side to side weight shifting x 2 min each time (performed twice).  Pt has good trunk and hip stabelizers.  Knee remains unstable.General Gait Details: applied short 16 inch KI to L knee and wrapped L ankle into DF.  Also applied L sling for support.  Used B platform EVA with B elbows planted for support and + 2 asisst to amb pt twice.  Pt was able to amb 18 feet the first trip with 50% VC's on proper sequencing and Max Assist to stabelize EVA walker due to lateral Right drift.  Amb a second time 20 feet.  Pt had difficulty correctly advancing L LE 50% of time so required 50% physical assist to correctly advance to  avoid scissoring and over stepping. Pt tolerated well. Pt will need ST Rehab at SNF.   Recommendations for follow up therapy are one component of a multi-disciplinary discharge planning process, led by the attending physician.  Recommendations may be updated based on patient status, additional functional criteria and insurance authorization.  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  Good trunk stability.  Able to sit EOB Indep.  Unable to functionally use L UE (  flaccid).  Has a sling for OOB support.    Transfers Overall transfer level: Needs assistance Equipment used: 1 person hand held assist Transfers: Sit to/from Stand Sit to Stand: Mod assist           General transfer comment: first perfromed pre gait dynamic standing at bed side with Therapist in front blocking L knee while pt performed lateral side to side  weight shifting x 2 min each time (performed twice).  Pt has good trunk and hip stabelizers.  Knee remains unstable.    Ambulation/Gait     Assistive device: Bilateral platform walker Gait Pattern/deviations: Step-to pattern, Step-through pattern, Knees buckling Gait velocity: decreased     General Gait Details: applied short 16 inch KI to L knee and wrapped L ankle into DF.  Also applied L sling for support.  Used B platform EVA with B elbows planted for support and + 2 asisst to amb pt twice.  Pt was able to amb 18 feet the first trip with 50% VC's on proper sequencing and Max Assist to stabelize EVA walker due to lateral Right drift.  Amb a second time 20 feet.  Pt had difficulty correctly advancing L LE 50% of time so required 50% physical assist to correctly advance to avoid scissoring and over stepping. Pt tolerated well.   Stairs             Wheelchair Mobility    Modified Rankin (Stroke Patients Only)       Balance                                            Cognition Arousal/Alertness: Awake/alert Behavior During Therapy: WFL for tasks assessed/performed Overall Cognitive Status: Within Functional Limits for tasks assessed                                 General Comments: AxO x 3 very aware of his current medical status and prognosis but still very hopeful he will regain mobility as he continue his Radiation        Exercises      General Comments        Pertinent Vitals/Pain Pain Assessment Pain Assessment: No/denies pain    Home Living                          Prior Function            PT Goals (current goals can now be found in the care plan section) Progress towards PT goals: Progressing toward goals    Frequency    Min 3X/week      PT Plan Current plan remains appropriate;Equipment recommendations need to be updated    Co-evaluation              AM-PAC PT "6 Clicks" Mobility    Outcome Measure  Help needed turning from your back to your side while in a flat bed without using bedrails?: A Lot Help needed moving from lying on your back to sitting on the side of a flat bed without using bedrails?: A Lot Help needed moving to and from a bed to a chair (including a wheelchair)?: A Lot Help needed standing up from a chair using your arms (e.g., wheelchair or bedside chair)?: A Lot Help  needed to walk in hospital room?: Total Help needed climbing 3-5 steps with a railing? : Total 6 Click Score: 10    End of Session Equipment Utilized During Treatment: Gait belt Activity Tolerance: Patient tolerated treatment well Patient left: with call bell/phone within reach;in chair;with chair alarm set Nurse Communication: Mobility status PT Visit Diagnosis: Difficulty in walking, not elsewhere classified (R26.2);Unsteadiness on feet (R26.81);Hemiplegia and hemiparesis Hemiplegia - Right/Left: Left     Time: 5638-7564 PT Time Calculation (min) (ACUTE ONLY): 32 min  Charges:  $Gait Training: 8-22 mins $Therapeutic Activity: 8-22 mins                     {Jori Frerichs  PTA Acute  Rehabilitation Services Pager      [phone] Office      [phone]

## 2021-07-17 NOTE — Plan of Care (Signed)

## 2021-07-17 NOTE — Progress Notes (Signed)
End of shift  Pt declined getting out of bed into the chair multiple times.  PT arrived and I told him he was going to have to work with them as they were on a schedule.  Pt participated.  Pt complained of pain on his "entire" left side at the beginning of the shift.  Pt had a BM on the BSC.    Pt anticipating going for a radiation treatment 2/6.  Dr Dwyane Dee advised pt that the plan would be formalized tomorrow 2/6 so that the pt will know if he remains inpatient until radiation treatment is completed or if he will be discharged to a SNF and go  outpatient,

## 2021-07-17 NOTE — TOC Progression Note (Signed)
Transition of Care [facility]) - Progression Note    Patient Details  Name: Stephen Diaz MRN: [id number] Date of Birth: 10/17/1955  Transition of Care [facility]) CM/SW Contact  Ross Ludwig,  Phone Number: 07/17/2021, 6:14 PM  Clinical Narrative:    CSW continuing to follow patient's progress throughout discharge planning.     Barriers to Discharge: No Barriers Identified  Expected Discharge Plan and Services                                                 Social Determinants of Health (SDOH) Interventions    Readmission Risk Interventions Readmission Risk Prevention Plan 07/15/2021  Transportation Screening Complete  PCP or Specialist Appt within 3-5 Days Complete  HRI or [place] Complete  Social Work Consult for [place] Planning/Counseling Complete  Palliative Care Screening Not Applicable  Medication Review Press photographer) Complete  Some recent data might be hidden

## 2021-07-18 ENCOUNTER — Other Ambulatory Visit: Payer: Self-pay | Admitting: *Deleted

## 2021-07-18 LAB — BASIC METABOLIC PANEL
Anion gap: 8 (ref 5–15)
BUN: 25 mg/dL — ABNORMAL HIGH (ref 8–23)
CO2: 25 mmol/L (ref 22–32)
Calcium: 8.3 mg/dL — ABNORMAL LOW (ref 8.9–10.3)
Chloride: 93 mmol/L — ABNORMAL LOW (ref 98–111)
Creatinine, Ser: 0.49 mg/dL — ABNORMAL LOW (ref 0.61–1.24)
GFR, Estimated: >60 mL/min (ref >60)
Glucose, Bld: 176 mg/dL — ABNORMAL HIGH (ref 70–99)
Potassium: 4.4 mmol/L (ref 3.5–5.1)
Sodium: 126 mmol/L — ABNORMAL LOW (ref 135–145)

## 2021-07-18 LAB — GLUCOSE, CAPILLARY
Glucose-Capillary: 143 mg/dL — ABNORMAL HIGH (ref 70–99)
Glucose-Capillary: 187 mg/dL — ABNORMAL HIGH (ref 70–99)
Glucose-Capillary: 144 mg/dL — ABNORMAL HIGH (ref 70–99)
Glucose-Capillary: 201 mg/dL — ABNORMAL HIGH (ref 70–99)

## 2021-07-18 NOTE — Care Management Important Message (Signed)
Important Message  Patient Details IM Letter given to the Patient. Name: Stephen Diaz MRN: [id number] Date of Birth: 1955-07-10   Medicare Important Message Given:  Yes     Kerin Salen 07/18/2021, 1:51 PM

## 2021-07-18 NOTE — Progress Notes (Signed)
Occupational Therapy Treatment Patient Details Name: Stephen Diaz MRN: [id number] DOB: 1955/10/02 Today's Date: 07/18/2021   History of present illness Pt is a 66 y.o. male returning to [facility] on 1/25 after discharge on 1/24 for recent admission for progressively worsening Lt sided weakness. CT scan of brain significant for 3 masses (Rt and Lt) up to 3 cm in diameter with vasogenic edema present mass effect on the lateral ventricles, but no midline shift. CT and MRI in ED negative for acute intracranial findings such as CVA. 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   OT comments  Chart reviewed, RN cleared pt for participation in OT tx session. Tx session targeted progressing independent functional mobility. L arm sling donned with MOD A, KI with MOD A per pt request. Ace wrap not donned as gait training not completed on this date, only functional transfers. Pt performs supine>sit with SUP, STS with MIN A, SPT with MIN A. Pt left in chair, NAD, all needs met. LUE positioned appropriately to facilitate optimal joint integrity. OT will continue to follow while admitted.    Recommendations for follow up therapy are one component of a multi-disciplinary discharge planning process, led by the attending physician.  Recommendations may be updated based on patient status, additional functional criteria and insurance authorization.    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 Required Braces or Orthoses: Knee Immobilizer - Right;Sling Restrictions Weight Bearing Restrictions: No       Mobility Bed Mobility                    Transfers                         Balance Overall balance assessment: Needs assistance Sitting-balance support: No upper extremity supported, Feet supported Sitting balance-Leahy Scale: Fair     Standing balance support: Single extremity supported Standing balance-Leahy Scale: Poor                             ADL either performed or assessed with clinical judgement   ADL Overall ADL's : Needs assistance/impaired Eating/Feeding: Set up;Sitting               Upper Body Dressing : Moderate assistance Upper Body Dressing Details (indicate cue type and reason): sling Lower Body Dressing: Maximal assistance   Toilet Transfer: Minimal assistance Toilet Transfer Details (indicate cue type and reason): simulated           General ADL Comments: SUP for supine>sit, MIN A for STS, SPT to bedside chair    Extremity/Trunk Assessment              Vision       Perception     Praxis      Cognition Arousal/Alertness: Awake/alert Behavior During Therapy: WFL for tasks assessed/performed Overall Cognitive Status: Within Functional Limits for tasks assessed  General Comments: alert and oriented x4        Exercises Other Exercises Other Exercises: education re: role of OT, joint integrity throughout LUE, LUE positioning, ADL task completion, functional mobility    Shoulder Instructions       General Comments vss throughout    Pertinent Vitals/ Pain       Pain Assessment Pain Assessment: No/denies pain  Home Living                                          Prior Functioning/Environment              Frequency  Min  3X/week        Progress Toward Goals  OT Goals(current goals can now be found in the care plan section)  Progress towards OT goals: Progressing toward goals  Acute Rehab OT Goals Patient Stated Goal: to gain mobility OT Goal Formulation: With patient Time For Goal Achievement: 08/01/21 Potential to Achieve Goals: [place] Discharge plan remains appropriate    Co-evaluation                 AM-PAC OT "6 Clicks" Daily Activity     Outcome Measure   Help from another person eating meals?: A Little Help from another person taking care of personal grooming?: A Little Help from another person toileting, which includes using toliet, bedpan, or urinal?: A Lot Help from another person bathing (including washing, rinsing, drying)?: A Lot Help from another person to put on and taking off regular upper body clothing?: A Lot Help from another person to put on and taking off regular lower body clothing?: A Lot 6 Click Score: 14    End of Session Equipment Utilized During Treatment: Gait belt;Rolling walker (2 wheels)  OT Visit Diagnosis: History of falling (Z91.81);Unsteadiness on feet (R26.81);Hemiplegia and hemiparesis Hemiplegia - Right/Left: Left Hemiplegia - dominant/non-dominant: Non-Dominant Hemiplegia - caused by: Unspecified   Activity Tolerance Patient tolerated treatment well   Patient Left in chair;with call bell/phone within reach;with chair alarm set   Nurse Communication Mobility status        Time: 4718-5501 OT Time Calculation (min): 14 min  Charges: OT General Charges $OT Visit: 1 Visit OT Treatments $Therapeutic Activity: 8-22 mins  Shanon Payor, OTD OTR/L  07/18/21, 12:33 PM

## 2021-07-18 NOTE — TOC Progression Note (Signed)
Transition of Care [facility]) - Progression Note    Patient Details  Name: Stephen Diaz MRN: [id number] Date of Birth: 12-26-1955  Transition of Care [facility]) CM/SW Contact  Nieshia Larmon, Juliann Pulse, RN Phone Number: 07/18/2021, 3:13 PM  Clinical Narrative:  [employer] for SNF chosen rep Ebony Hail following.Last xrt tomorrow;will need bivalent covid vaccine prior going to SNF.Left 2 messages w/dtr Sharyn Lull for call back.MD updated.     Expected Discharge Plan: Skilled Nursing Facility Barriers to Discharge: Continued Medical Work up  Expected Discharge Plan and Services Expected Discharge Plan: [place]                                               Social Determinants of Health (SDOH) Interventions    Readmission Risk Interventions Readmission Risk Prevention Plan 07/15/2021  Transportation Screening Complete  PCP or Specialist Appt within 3-5 Days Complete  HRI or [place] Complete  Social Work Consult for [place] Planning/Counseling Complete  Palliative Care Screening Not Applicable  Medication Review Press photographer) Complete  Some recent data might be hidden

## 2021-07-18 NOTE — Progress Notes (Signed)
PROGRESS NOTE    Stephen Diaz  PTW:[id number] DOB: June 26, 1955 DOA: 07/06/2021 PCP: [name], PA-C    Brief Narrative:  This 66 year old male with metastatic lung cancer to the brain, COPD, DM 2, HTN who comes into the hospital with worsening left-sided weakness.  Imaging in the ER with an MRI showed frontal lobes metastatic disease with vasogenic edema, no bleeding or midline shift. Dr. Loleta Books discussed with neurooncology and radiation oncology with plans for urgent radiation therapy likely on Monday 07/12/21.  Assessment & Plan:   Principal Problem:   Vasogenic edema (HCC) Active Problems:   Chronic obstructive pulmonary disease (HCC)   Type 2 diabetes mellitus (HCC)   Squamous cell carcinoma of lung, stage I, right (HCC)   Metastasis to brain [facility])   Essential hypertension   Dyslipidemia   Acute left-sided weakness   Pressure injury of ischium, stage 1   Brain metastases (HCC)  Left sided weakness due to brain metastasis with vasogenic edema: Patient presented with worsening left-sided weakness. He underwent EEG on admission which was unremarkable for seizures. He was empirically started on Keppra and Decadron. Continue Keppra and Decadron. It was discussed with radiation oncology shortly after admission and plans are to start radiation therapy on 07/12/2021. Patient has radiation therapy started on 07/12/21,  reports improvement in his weakness. Palliative care discussed goals of care.  Continue ongoing discussions. There is slight improvement in left sided weakness.  He is able to walk with support, but unable to move left arm.  Stage IV lung cancer: Continue radiation treatment started on 07/12/2021. Palliative care consulted to discuss goals of care. Patient wants to follow-up with oncology to see if systemic therapy can be started. Follow-up radiation oncology about duration of radiation therapy.  Essential hypertension: Continue lisinopril.  Type 2  diabetes: Continue metformin, sliding scale. Continue steroids. Monitor CBG  COPD: Stable.  Continue home inhalers.  BPH: Continue tamsulosin  Hyponatremia Likely due to brain mets. Repeat Sodium 129>127>127 >127>126 Continue gentle IV hydration. Continue salt tablets tid.  Leukocytosis: Could be secondary to steroids, no signs of infection.   DVT prophylaxis:  SCDs Code Status: DNR Family Communication:  No family at bed side. Disposition Plan:   Status is: Inpatient  Remains inpatient appropriate because:  Persistent left-sided weakness, requiring radiation therapy.  PT and OT evaluation recommended SNF.  Patient medically clear for discharge: No  Consultants:  Radiation therapy, Palliative care  Procedures:  MRI Antimicrobials:  Anti-infectives (From admission, onward)    None       Subjective: Patient was seen and examined at bedside.  Overnight events noted. He reports slight improvement in his leg weakness, participated in physical therapy yesterday He is not able to move left arm but able to move left leg.  He is using his right arm to lift left arm.   Objective: Vitals:   07/17/21 2105 07/18/21 0619 07/18/21 0825 07/18/21 1152  BP: 101/67 94/72  109/79  Pulse: 73 61  63  Resp: 18 20  20   Temp: 97.8 F (36.6 C) (!) 97.3 F (36.3 C)  (!) 97.5 F (36.4 C)  TempSrc: Oral Oral  Oral  SpO2: 96% 97% 96% 98%  Weight:      Height:        Intake/Output Summary (Last 24 hours) at 07/18/2021 1338 Last data filed at 07/17/2021 2200 Gross per 24 hour  Intake 240 ml  Output 200 ml  Net 40 ml   Filed Weights   07/06/21  4270 07/06/21 2045  Weight: 79.4 kg 81.3 kg    Examination:  General exam: Appears comfortable, deconditioned, not in any distress. Respiratory system: Clear to auscultation bilaterally, respiratory effort normal, RR 14 Cardiovascular system: S1 & S2 heard, regular rate and rhythm, no murmur. Gastrointestinal system: Abdomen is  soft, non tender, non distended, BS+ Central nervous system: Alert and oriented x3 . left-sided weakness, moves left leg, not able to move left arm. Extremities: No edema, no cyanosis, no clubbing. Skin: No rashes, lesions or ulcers Psychiatry: Judgement and insight appear normal. Mood & affect appropriate.     Data Reviewed: I have personally reviewed following labs and imaging studies  CBC: Recent Labs  Lab 07/13/21 0447 07/14/21 0449 07/15/21 0436 07/16/21 0558 07/17/21 0535  WBC 15.7* 19.4* 18.8* 20.7* 22.2*  HGB 14.0 13.8 14.1 13.7 13.8  HCT 42.5 40.3 42.0 41.0 41.8  MCV 84.3 82.6 83.7 83.2 82.9  PLT 319 353 336 338 623   Basic Metabolic Panel: Recent Labs  Lab 07/12/21 0424 07/13/21 0447 07/14/21 0449 07/15/21 0436 07/17/21 0535 07/18/21 0452  NA 127* 127* 127* 127* 125* 126*  K 4.8 4.4 4.4 4.4 4.0 4.4  CL 94* 93* 93* 94* 90* 93*  CO2 22 24 25 24 24 25   GLUCOSE 161* 157* 159* 146* 129* 176*  BUN 22 25* 27* 24* 23 25*  CREATININE 0.55* 0.54* 0.65 0.49* 0.57* 0.49*  CALCIUM 8.7* 8.9 8.9 8.7* 8.6* 8.3*  MG 2.1 2.1 1.9  --  1.9  --   PHOS 3.6 4.1 4.4  --  4.1  --    GFR: Estimated Creatinine Clearance: 102.6 mL/min (A) (by C-G formula based on SCr of 0.49 mg/dL (L)). Liver Function Tests: No results for input(s): AST, ALT, ALKPHOS, BILITOT, PROT, ALBUMIN in the last 168 hours.  No results for input(s): LIPASE, AMYLASE in the last 168 hours. No results for input(s): AMMONIA in the last 168 hours. Coagulation Profile: No results for input(s): INR, PROTIME in the last 168 hours. Cardiac Enzymes: No results for input(s): CKTOTAL, CKMB, CKMBINDEX, TROPONINI in the last 168 hours. BNP (last 3 results) No results for input(s): PROBNP in the last 8760 hours. HbA1C: No results for input(s): HGBA1C in the last 72 hours. CBG: Recent Labs  Lab 07/17/21 1123 07/17/21 1718 07/17/21 2107 07/18/21 0749 07/18/21 1150  GLUCAP 170* 198* 224* 143* 187*   Lipid  Profile: No results for input(s): CHOL, HDL, LDLCALC, TRIG, CHOLHDL, LDLDIRECT in the last 72 hours. Thyroid Function Tests: No results for input(s): TSH, T4TOTAL, FREET4, T3FREE, THYROIDAB in the last 72 hours. Anemia Panel: No results for input(s): VITAMINB12, FOLATE, FERRITIN, TIBC, IRON, RETICCTPCT in the last 72 hours. Sepsis Labs: No results for input(s): PROCALCITON, LATICACIDVEN in the last 168 hours.  No results found for this or any previous visit (from the past 240 hour(s)).  Radiology Studies: No results found.  Scheduled Meds:  dexamethasone  4 mg Oral TID   famotidine  20 mg Oral Daily   fluticasone furoate-vilanterol  1 puff Inhalation Daily   And   umeclidinium bromide  1 puff Inhalation Daily   insulin aspart  0-15 Units Subcutaneous TID WC   levETIRAcetam  500 mg Oral BID   lisinopril  5 mg Oral Daily   melatonin  3 mg Oral QHS   metFORMIN  1,000 mg Oral Q breakfast   sodium chloride  1 g Oral TID WC   tamsulosin  0.4 mg Oral QPC breakfast   Continuous  Infusions:   LOS: 11 days    Time spent: 35 MINS.    Shawna Clamp, MD [facility]   If 7PM-7AM, please contact night-coverage

## 2021-07-19 ENCOUNTER — Ambulatory Visit
Admission: RE | Admit: 2021-07-19 | Discharge: 2021-07-19 | Disposition: A | Discharge status: Home / Self Care | Payer: Medicare Other | Source: Ambulatory Visit | Attending: Radiation Oncology | Admitting: Radiation Oncology

## 2021-07-19 ENCOUNTER — Inpatient Hospital Stay: Payer: Medicare Other

## 2021-07-19 ENCOUNTER — Encounter: Payer: Self-pay | Admitting: *Deleted

## 2021-07-19 ENCOUNTER — Inpatient Hospital Stay: Payer: Medicare Other | Admitting: Physician Assistant

## 2021-07-19 ENCOUNTER — Encounter: Payer: Self-pay | Admitting: Radiation Oncology

## 2021-07-19 LAB — BASIC METABOLIC PANEL
Anion gap: 9 (ref 5–15)
BUN: 23 mg/dL (ref 8–23)
CO2: 25 mmol/L (ref 22–32)
Calcium: 8.6 mg/dL — ABNORMAL LOW (ref 8.9–10.3)
Chloride: 94 mmol/L — ABNORMAL LOW (ref 98–111)
Creatinine, Ser: 0.7 mg/dL (ref 0.61–1.24)
GFR, Estimated: >60 mL/min (ref >60)
Glucose, Bld: 159 mg/dL — ABNORMAL HIGH (ref 70–99)
Potassium: 4.4 mmol/L (ref 3.5–5.1)
Sodium: 128 mmol/L — ABNORMAL LOW (ref 135–145)

## 2021-07-19 LAB — RESP PANEL BY RT-PCR (FLU A&B, COVID) ARPGX2
Influenza A by PCR: NEGATIVE
Influenza B by PCR: NEGATIVE
SARS Coronavirus 2 by RT PCR: NEGATIVE

## 2021-07-19 LAB — GLUCOSE, CAPILLARY
Glucose-Capillary: 120 mg/dL — ABNORMAL HIGH (ref 70–99)
Glucose-Capillary: 218 mg/dL — ABNORMAL HIGH (ref 70–99)

## 2021-07-19 LAB — CBC
HCT: 40.9 % (ref 39.0–52.0)
Hemoglobin: 13.7 g/dL (ref 13.0–17.0)
MCH: 27.7 pg (ref 26.0–34.0)
MCHC: 33.5 g/dL (ref 30.0–36.0)
MCV: 82.8 fL (ref 80.0–100.0)
Platelets: 307 10*3/uL (ref 150–400)
RBC: 4.94 MIL/uL (ref 4.22–5.81)
RDW: 14.6 % (ref 11.5–15.5)
WBC: 15.7 10*3/uL — ABNORMAL HIGH (ref 4.0–10.5)
nRBC: 0 % (ref 0.0–0.2)

## 2021-07-19 MED ORDER — COVID-19MRNA BIVAL VACC PFIZER 30 MCG/0.3ML IM SUSP
0.3000 mL | Freq: Once | INTRAMUSCULAR | Status: AC
  Administered 2021-07-19: 0.3 mL via INTRAMUSCULAR
  Filled 2021-07-19: qty 0.3

## 2021-07-19 MED ORDER — LEVETIRACETAM 500 MG PO TABS: 500.0000 mg | ORAL_TABLET | Freq: Two times a day (BID) | ORAL | 0 refills | Status: DC

## 2021-07-19 MED ORDER — LISINOPRIL 5 MG PO TABS: 5.0000 mg | ORAL_TABLET | Freq: Every day | ORAL | 1 refills | Status: AC

## 2021-07-19 MED ORDER — OXYCODONE HCL 5 MG PO TABS: 5.0000 mg | ORAL_TABLET | ORAL | 0 refills | Status: AC | PRN

## 2021-07-19 MED ORDER — SODIUM CHLORIDE 1 G PO TABS: 1.0000 g | ORAL_TABLET | Freq: Three times a day (TID) | ORAL | 0 refills | Status: AC

## 2021-07-19 NOTE — TOC Transition Note (Addendum)
Transition of Care [facility]) - CM/SW Discharge Note   Patient Details  Name: Stephen Diaz MRN: [id number] Date of Birth: [birth year]  [facility]) CM/SW Contact:  Dessa Phi, RN Phone Number: 07/19/2021, 11:57 AM   Clinical Narrative: For d/c [place] aware. Awaiting post xrt,covid results. Packet @ nsg station if after 4p-Nsg will call PTAR once all info needed is completed.  -12:21p-going to rm#[phone],nsg call report tel#[phone].  -3p-PTAR called. No further CM needs.    Final next level of care: Skilled Nursing Facility Barriers to Discharge: No Barriers Identified   Patient Goals and CMS Choice Patient states their goals for this hospitalization and ongoing recovery are:: rehab CMS Medicare.gov Compare Post Acute Care list provided to:: Patient Represenative (must comment) Sharyn Lull dtr [phone]) Choice offered to / list presented to : Patient  Discharge Placement                       Discharge Plan and Services                                     Social Determinants of Health (SDOH) Interventions     Readmission Risk Interventions Readmission Risk Prevention Plan 07/15/2021  Transportation Screening Complete  PCP or Specialist Appt within 3-5 Days Complete  HRI or [place] Complete  Social Work Consult for [place] Planning/Counseling Complete  Palliative Care Screening Not Applicable  Medication Review Press photographer) Complete  Some recent data might be hidden

## 2021-07-19 NOTE — Discharge Summary (Signed)
Physician Discharge Summary  Stephen Diaz YSA:[id number] DOB: 1955/11/04 DOA: 07/06/2021  PCP: [name], PA-C  Admit date: 07/06/2021  Discharge date: 07/19/2021  Admitted From: Home.  Disposition: SNF  Recommendations for Outpatient Follow-up:  Follow up with PCP in 1-2 weeks. Please obtain BMP/CBC in one week. Advised to follow-up with oncology as scheduled. Advised to take sodium chloride tablets 3 times daily.  Home Health: None Equipment/Devices: None  Discharge Condition: Stable CODE STATUS:DNR Diet recommendation: Heart Healthy   Brief Summary/ Hospital Course: This 66 year old male with metastatic lung cancer to the brain, COPD, DM 2, HTN who comes into the hospital with worsening left-sided weakness.  Imaging in the ER with an MRI showed frontal lobes metastatic disease with vasogenic edema, no bleeding or midline shift.  Case was  discussed with neuro-oncology and radiation oncology recommended to start radiation treatment.  Patient was started on Decadron and Keppra for seizure prophylaxis.  Patient was continued on radiation treatment.  Patient has significant improvement in left leg weakness but still unable to move his left arm.  Patient has completed radiation treatment.  Plan is to continue follow-up with oncology and Decadron and Keppra.  PT recommended skilled nursing facility.  Patient is being discharged to SNF for rehab.  Patient was also found to have low sodium and started on salt tablets which is improving.  Patient will get BiValent COVID-vaccine before discharge.  He was managed for below problems.  Discharge Diagnoses:  Principal Problem:   Vasogenic edema (HCC) Active Problems:   Chronic obstructive pulmonary disease (HCC)   Type 2 diabetes mellitus (HCC)   Squamous cell carcinoma of lung, stage I, right (HCC)   Metastasis to brain [facility])   Essential hypertension   Dyslipidemia   Acute left-sided weakness   Pressure injury of ischium, stage  1   Brain metastases (HCC)  Left sided weakness due to brain metastasis with vasogenic edema: Patient presented with worsening left-sided weakness. He underwent EEG on admission which was unremarkable for seizures. He was empirically started on Keppra and Decadron. Continue Keppra and Decadron. It was discussed with radiation oncology shortly after admission and plans are to start radiation therapy on 07/12/2021. Patient has radiation therapy started on 07/12/21,  reports improvement in his weakness. Palliative care discussed goals of care.  Continue ongoing discussions. There is slight improvement in left sided weakness.  He is able to walk with support, but unable to move left arm. Follow-up with oncology after discharge.   Stage IV lung cancer: Continue radiation treatment started on 07/12/2021. Palliative care consulted to discuss goals of care. Patient wants to follow-up with oncology to see if systemic therapy can be started. Follow-up radiation oncology about duration of radiation therapy.   Essential hypertension: Continue lisinopril.   Type 2 diabetes: Continue metformin, sliding scale. Continue steroids. Monitor CBG   COPD: Stable.  Continue home inhalers.   BPH: Continue tamsulosin   Hyponatremia Likely due to brain mets. Repeat Sodium 129>127>127 >127>126 >128 Continue gentle IV hydration. Continue salt tablets tid.   Leukocytosis: Could be secondary to steroids, no signs of infection.  Discharge Instructions  Discharge Instructions     Call MD for:  difficulty breathing, headache or visual disturbances   Complete by: As directed    Call MD for:  persistant dizziness or light-headedness   Complete by: As directed    Call MD for:  persistant nausea and vomiting   Complete by: As directed    Diet - low sodium  heart healthy   Complete by: As directed    Diet Carb Modified   Complete by: As directed    Discharge instructions   Complete by: As directed     Advised to follow-up with primary care physician in 1 week. Advised to follow-up with oncology as scheduled. Advised to take sodium chloride tablets 3 times daily.   Discharge wound care:   Complete by: As directed    Follow-up wound care at nursing home.   For home use only DME high strength lightweight manual wheelchair with seat cushion   Complete by: As directed    Patient suffers from L sided weakness which impairs their ability to perform daily activities like bathing, dressing, and toileting in the home.  A walker will not resolve  issue with performing activities of daily living. A wheelchair will allow patient to safely perform daily activities.Length of need Lifetime. (THEN ONE OF THESE TWO:) Patient self-propels the wheelchair while engaging in frequent activities such as laundry, meals, and toileting which cannot be performed in a standard or lightweight wheelchair due to the weight of the chair. Accessories: elevating leg rests (ELRs), wheel locks, extensions and anti-tippers.   Increase activity slowly   Complete by: As directed       Allergies as of 07/19/2021   No Known Allergies      Medication List     STOP taking these medications    ibuprofen 200 MG tablet Commonly known as: ADVIL       TAKE these medications    albuterol 108 (90 Base) MCG/ACT inhaler Commonly known as: VENTOLIN HFA Inhale 2 puffs into the lungs every 6 (six) hours as needed for wheezing or shortness of breath.   aspirin 81 MG EC tablet Take 81 mg by mouth daily.   Centrum Silver 50+Men Tabs Take 1 tablet by mouth daily.   dexamethasone 4 MG tablet Commonly known as: DECADRON Take 1 tablet (4 mg total) by mouth 3 (three) times daily.   insulin lispro 200 UNIT/ML KwikPen Commonly known as: HUMALOG Inject 2-10 Units into the skin 3 (three) times daily before meals. Take 2 units for glucose 150-200, 4 units for glucose 201-250, 6 units for glucose 251-300, 8 units for glucose 201-350  and 10 units for glucose 351-400.   levETIRAcetam 500 MG tablet Commonly known as: KEPPRA Take 1 tablet (500 mg total) by mouth 2 (two) times daily.   lisinopril 5 MG tablet Commonly known as: ZESTRIL Take 1 tablet (5 mg total) by mouth daily. Start taking on: July 20, 2021 What changed:  medication strength how much to take   metFORMIN 500 MG tablet Commonly known as: GLUCOPHAGE Take 1,000 mg by mouth daily with breakfast.   oxyCODONE 5 MG immediate release tablet Commonly known as: Oxy IR/ROXICODONE Take 1 tablet (5 mg total) by mouth every 4 (four) hours as needed for moderate pain.   Pen Needles 3/16" 31G X 5 MM Misc Use as directed with insulin pen   sodium chloride 1 g tablet Take 1 tablet (1 g total) by mouth 3 (three) times daily with meals.   tamsulosin 0.4 MG Caps capsule Commonly known as: FLOMAX Take 1 capsule (0.4 mg total) by mouth daily after breakfast.   Trelegy Ellipta 100-62.5-25 MCG/ACT Aepb Generic drug: Fluticasone-Umeclidin-Vilant Inhale 1 puff into the lungs daily.               Durable Medical Equipment  (From admission, onward)  Start     Ordered   07/06/21 0000  For home use only DME high strength lightweight manual wheelchair with seat cushion       Comments: Patient suffers from L sided weakness which impairs their ability to perform daily activities like bathing, dressing, and toileting in the home.  A walker will not resolve  issue with performing activities of daily living. A wheelchair will allow patient to safely perform daily activities.Length of need Lifetime. (THEN ONE OF THESE TWO:) Patient self-propels the wheelchair while engaging in frequent activities such as laundry, meals, and toileting which cannot be performed in a standard or lightweight wheelchair due to the weight of the chair. Accessories: elevating leg rests (ELRs), wheel locks, extensions and anti-tippers.   07/06/21 0934               Discharge Care Instructions  (From admission, onward)           Start     Ordered   07/19/21 0000  Discharge wound care:       Comments: Follow-up wound care at nursing home.   07/19/21 1117            Follow-up Information     [facility] Follow up.   Specialty: Home Health Services Contact information: [place] [address] [phone]         [name], PA-C Follow up in 1 week(s).   Specialty: Physician Assistant Contact information: [place] [address] [phone]         Curt Bears, MD Follow up in 1 week(s).   Specialty: Oncology Contact information: [place] [address] [phone]                No Known Allergies  Consultations: Oncology Radiation oncology   Procedures/Studies: CT HEAD WO CONTRAST (5MM)  Result Date: 07/06/2021 CLINICAL DATA:  Fall from chair. Weakness of left extremity for 2 weeks EXAM: CT HEAD WITHOUT CONTRAST TECHNIQUE: Contiguous axial images were obtained from the base of the skull through the vertex without intravenous contrast. RADIATION DOSE REDUCTION: This exam was performed according to the departmental dose-optimization program which includes automated exposure control, adjustment of the mA and/or kV according to patient size and/or use of iterative reconstruction technique. COMPARISON:  Brain MRI from 3 days ago FINDINGS: Brain: Known presumed brain metastases in the anterior left frontal and right posterior frontal lobes with cavitary morphology and extensive vasogenic edema. Metastatic disease is underestimated compared to MRI. No hemorrhage, infarct, or hydrocephalus. No new finding. Vascular: No hyperdense vessel or unexpected calcification. Skull: No acute or aggressive finding Sinuses/Orbits: Bilateral cataract resection IMPRESSION: Known presumed brain metastases seen in the left anterior frontal and right posterior  frontal lobes with extensive vasogenic edema. No change since brain MRI 3 days ago. Electronically Signed   By: Jorje Guild M.D.   On: 07/06/2021 07:42   CT HEAD W & WO CONTRAST (5MM)  Result Date: 07/03/2021 CLINICAL DATA:  66 year old male with multiple falls. Neurologic deficit. Left extremity numbness for 2 days. Cavitary right lung lesion suspicious for lung cancer in October. EXAM: CT HEAD WITHOUT AND WITH CONTRAST TECHNIQUE: Contiguous axial images were obtained from the base of the skull through the vertex without and with intravenous contrast. RADIATION DOSE REDUCTION: This exam was performed according to the departmental dose-optimization program which includes automated exposure control, adjustment of the mA and/or kV according to patient size  and/or use of iterative reconstruction technique. CONTRAST:  8mL OMNIPAQUE IOHEXOL 300 MG/ML  SOLN COMPARISON:  PET-CT 04/04/2021. FINDINGS: Brain: Multifocal cerebral vasogenic edema. Anterior left frontal lobe 3 cm rim enhancing mass (series 7, image 21) with regional vasogenic edema. Posterior right superior frontal gyrus 2.6 cm rim enhancing mass with regional vasogenic edema. This lesion is perirolandic, with enhancement and edema subsequently affecting the left extremity motor and sensory areas. Mass effect on the lateral ventricles, but no midline shift. Basilar cisterns remain patent. No ventriculomegaly. No other No abnormal enhancement identified. No acute intracranial hemorrhage identified. No cortically based acute infarct identified. Vascular: Mild Calcified atherosclerosis at the skull base. Major intracranial vascular structures are enhancing as expected and appear to be patent. Skull: No acute or suspicious osseous lesion identified. Sinuses/Orbits: Visualized paranasal sinuses and mastoids are clear. Other: Postoperative changes to both globes. No acute orbit or scalp soft tissue finding. IMPRESSION: 1. Positive for Metastatic Disease to  the brain. Two rim enhancing masses, up to 3 cm diameter, with vasogenic edema. Superior Right Perirolandic Cortex and anterior left frontal lobe affected. Mass effect on the lateral ventricles but no midline shift and basilar cisterns remain normal. 2. Brain MRI without and with contrast might detect additional lesions. Electronically Signed   By: Genevie Ann M.D.   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 COMPARISON:  Same-day noncontrast head CT, MR head 07/03/2021 FINDINGS: Brain: There is no evidence of acute intracranial hemorrhage, extra-axial fluid collection, or acute infarct. Again seen are metastatic lesions in the left frontal lobe centered at the middle frontal gyrus and right precentral gyrus, unchanged in size compared to the recent study from 07/03/2021 measured on the noncontrast T1 sequence. SWI signal dropout within the right frontal lesion is unchanged, consistent with blood products. Perilesional edema in the left frontal lobe and right frontal and parietal lobes extending inferiorly to the right corona radiata is unchanged. There is partial effacement of the right lateral ventricle but no midline shift, unchanged. The additional 3 mm enhancing lesion seen in the left precentral gyrus is not seen on the current study in the absence of intravenous contrast. Vascular: Normal flow voids. Skull and upper cervical spine: Normal marrow signal. Upper cervical spine fusion hardware is partially imaged. Sinuses/Orbits: The paranasal sinuses are clear. Bilateral lens implants are in place. The globes and orbits are otherwise unremarkable. Other: There is a partially imaged T2 hyperintense lesion in the left neck  just inferolateral to the parotid gland. There is associated diffusion restriction (13-47). This lesion was present on the PET-CT from 04/04/2021 and was not hypermetabolic. IMPRESSION: 1. Unchanged size and noncontrast appearance of the metastatic lesions in the bilateral frontal lobes as above with unchanged extensive perilesional edema but no midline shift. 2. No evidence of acute intracranial hemorrhage or infarct. 3. Partially imaged cystic lesion in the left neck inferolateral to the parotid gland was present on the prior PET-CT from 04/04/2021 and was not hypermetabolic on that study. This may reflect a sebaceous cyst; however, a necrotic lymph node can not be entirely excluded. Recommend attention on follow-up PET-CTs. Electronically Signed   By: Valetta Mole M.D.   On: 07/06/2021 13:55   MR BRAIN W WO CONTRAST  Addendum Date: 07/11/2021  ADDENDUM REPORT: 07/11/2021 08:02 ADDENDUM: Original report by Dr. Collins [place]. Addendum by Dr. Jeralyn Ruths on 07/11/2021 following review in multidisciplinary oncology conference: A third enhancing lesion measures 2 mm in the left frontoparietal region (series 1100, image 208) and is unchanged from 07/03/2021. Electronically Signed   By: Logan Bores M.D.   On: 07/11/2021 08:02   Result Date: 07/11/2021 CLINICAL DATA:  Brain/CNS neoplasm, staging. EXAM: MRI HEAD WITHOUT AND WITH CONTRAST TECHNIQUE: Multiplanar, multiecho pulse sequences of the brain and surrounding structures were obtained without and with intravenous contrast. CONTRAST:  72mL GADAVIST GADOBUTROL 1 MMOL/ML IV SOLN COMPARISON:  07/06/2021 FINDINGS: Brain: Unchanged appearance of predominantly cystic lesions of the frontal lobes with severe surrounding edema. There is no internal diffusion restriction. Small amount of associated chronic blood products without acute hemorrhage. There is peripheral nodular contrast enhancement within both lesions, more notable on the right. There is no herniation. There are no  other lesions. Vascular: Major flow voids are preserved. Skull and upper cervical spine: Normal calvarium and skull base. Visualized upper cervical spine and soft tissues are normal. Sinuses/Orbits:No paranasal sinus fluid levels or advanced mucosal thickening. No mastoid or middle ear effusion. Normal orbits. IMPRESSION: Peripheral enhancement and nodularity of the 2 cystic lesions of the frontal lobes, which remain consistent with metastatic disease. No other lesions. Electronically Signed: By: Ulyses Jarred M.D. On: 07/07/2021 23:08   MR BRAIN W WO CONTRAST  Result Date: 07/03/2021 CLINICAL DATA:  Rule out metastatic disease. Squamous cell carcinoma lung. Abnormal head CT. EXAM: MRI HEAD WITHOUT AND WITH CONTRAST TECHNIQUE: Multiplanar, multiecho pulse sequences of the brain and surrounding structures were obtained without and with intravenous contrast. CONTRAST:  8.54mL GADAVIST GADOBUTROL 1 MMOL/ML IV SOLN COMPARISON:  CT head 07/03/2021 FINDINGS: Brain: 3 enhancing lesions in the brain are present compatible with metastatic disease. 2.8 cm left frontal mass with peripheral enhancement and central necrosis. Minimal associated hemorrhage. Moderate surrounding edema with mild midline shift to the right. 2.3 cm lesion in the precentral gyrus on the right. There is central necrosis with internal hemorrhage. Moderate white matter edema and local mass effect. 3 mm enhancing lesion left precentral gyrus without significant edema compatible with metastatic disease. Ventricle size normal.  Negative for acute infarct. Vascular: Normal arterial flow voids Skull and upper cervical spine: Negative Sinuses/Orbits: Paranasal sinuses clear. Bilateral cataract extraction Other: None IMPRESSION: Findings compatible with metastatic disease to brain. Three lesions are identified. Left frontal lesion and right precentral gyrus lesion show significant edema and local mass effect. 3 mm lesion left precentral gyrus. Electronically  Signed   By: Franchot Gallo M.D.   On: 07/03/2021 11:36   CT CHEST ABDOMEN PELVIS W CONTRAST  Result Date: 07/03/2021 CLINICAL DATA:  Brain metastatic disease, status post right lower lobectomy EXAM: CT CHEST, ABDOMEN, AND PELVIS WITH CONTRAST TECHNIQUE: Multidetector CT imaging of the chest, abdomen and pelvis was performed following the standard protocol during bolus administration of intravenous contrast. RADIATION DOSE REDUCTION: This exam was performed according to the departmental dose-optimization program which includes automated exposure control, adjustment of the mA and/or kV according to patient size and/or use of iterative reconstruction technique. CONTRAST:  17mL OMNIPAQUE IOHEXOL 300 MG/ML SOLN, additional oral enteric contrast COMPARISON:  None. FINDINGS: CT CHEST FINDINGS Cardiovascular: Aortic atherosclerosis. Normal heart size. Three-vessel coronary artery calcifications and stents. No pericardial effusion. Mediastinum/Nodes: No enlarged mediastinal, hilar, or axillary lymph nodes. Thyroid gland, trachea, and esophagus demonstrate no significant findings. Lungs/Pleura: Status post right lower lobectomy. Trace, loculated  right pleural effusion, with a small, loculated air component superiorly and dependently (series 5, image 86). Moderate centrilobular emphysema. Clustered centrilobular and tree-in-bud ground-glass opacities in the bilateral lung bases (series 5, image 126). No pleural effusion or pneumothorax. Musculoskeletal: No chest wall mass or suspicious osseous lesions identified. CT ABDOMEN PELVIS FINDINGS Hepatobiliary: No solid liver abnormality is seen. Faintly rim calcified gallstone in the gallbladder. No gallbladder wall thickening, or biliary dilatation. Pancreas: Unremarkable. No pancreatic ductal dilatation or surrounding inflammatory changes. Spleen: Normal in size without significant abnormality. Adrenals/Urinary Tract: Adrenal glands are unremarkable. Kidneys are normal,  without renal calculi, solid lesion, or hydronephrosis. Bladder is unremarkable. Stomach/Bowel: Stomach is within normal limits. Appendix appears normal. No evidence of bowel wall thickening, distention, or inflammatory changes. Descending and sigmoid diverticulosis. Vascular/Lymphatic: Aortic atherosclerosis. No enlarged abdominal or pelvic lymph nodes. Reproductive: No mass or other abnormality. Other: No abdominal wall hernia or abnormality. No ascites. Musculoskeletal: No acute osseous findings. IMPRESSION: 1. Status post right lower lobectomy. 2. No evidence of lymphadenopathy or metastatic disease in the chest, abdomen, or pelvis. Please note, that in order to ensure subspecialty interpretation, restaging examinations for known malignancies should generally be deferred to the nonemergent, outpatient setting. 3. Clustered centrilobular and tree-in-bud ground-glass opacities in the bilateral lung bases. Findings suggest atypical infection or aspiration. 4. Cholelithiasis. 5. Coronary artery disease. Aortic Atherosclerosis (ICD10-I70.0) and Emphysema (ICD10-J43.9). Electronically Signed   By: Delanna Ahmadi M.D.   On: 07/03/2021 13:29   DG Chest Port 1 View  Result Date: 07/03/2021 CLINICAL DATA:  Left arm and leg numbness.  Multiple falls. EXAM: PORTABLE CHEST 1 VIEW COMPARISON:  06/14/2021. FINDINGS: The heart size and mediastinal contours are within normal limits. Atherosclerotic calcification of the aorta is noted. There is a persistent small to moderate right pleural effusion with mild atelectasis at the right lung base. The left lung is clear. No pneumothorax. Shoulder arthroplasty changes are noted on the right. IMPRESSION: Persistent small to moderate right pleural effusion, not significantly changed from the prior exam. Electronically Signed   By: Brett Fairy M.D.   On: 07/03/2021 04:56   EEG adult  Result Date: 07/08/2021 Lora Havens, MD     07/08/2021  8:38 AM Patient Name: Stephen Diaz MRN: [id number] Epilepsy Attending: Lora Havens Referring Physician/Provider: Edwin Dada, MD Date: 1/26/223 Duration: 21.51 mins Patient history: 66 year old male with metastatic squamous lung cell carcinoma with mets to brain presented with acute left-sided weakness.  EEG to evaluate for seizure. Level of alertness: Awake, asleep AEDs during EEG study: None Technical aspects: This EEG study was done with scalp electrodes positioned according to the 10-20 International system of electrode placement. Electrical activity was acquired at a sampling rate of 500Hz  and reviewed with a high frequency filter of 70Hz  and a low frequency filter of 1Hz . EEG data were recorded continuously and digitally stored. Description: The posterior dominant rhythm consists of 8Hz  activity of moderate voltage (25-35 uV) seen predominantly in posterior head regions, symmetric and reactive to eye opening and eye closing. Sleep was characterized by vertex waves, sleep spindles (12 to 14 Hz), maximal frontocentral region. Single sharp transient was noted in right frontal temporal region. Hyperventilation and photic stimulation were not performed.   IMPRESSION: This study is within normal limits. No seizures or definite epileptiform discharges were seen throughout the recording. Priyanka Barbra Sarks     Subjective: Patient was seen and examined at bedside.  Overnight events noted.   Patient reports feeling  better.  He has participated in physical therapy, able to walk.   He is still having significant weakness in the left arm.  Discharge Exam: Vitals:   07/19/21 0740 07/19/21 0926  BP:  111/76  Pulse:  71  Resp:  13  Temp:  97.8 F (36.6 C)  SpO2: 97% 99%   Vitals:   07/18/21 2123 07/19/21 0542 07/19/21 0740 07/19/21 0926  BP: 111/80 112/80  111/76  Pulse: 64 (!) 53  71  Resp: 19 17  13   Temp: 97.9 F (36.6 C) 98 F (36.7 C)  97.8 F (36.6 C)  TempSrc: Oral Oral  Oral  SpO2: 97% 98% 97% 99%   Weight:      Height:        General: Pt is alert, awake, not in acute distress Cardiovascular: RRR, S1/S2 +, no rubs, no gallops Respiratory: CTA bilaterally, no wheezing, no rhonchi Abdominal: Soft, NT, ND, bowel sounds + Extremities: Able to move left leg, unable to move left arm.    The results of significant diagnostics from this hospitalization (including imaging, microbiology, ancillary and laboratory) are listed below for reference.     Microbiology: No results found for this or any previous visit (from the past 240 hour(s)).   Labs: BNP (last 3 results) No results for input(s): BNP in the last 8760 hours. Basic Metabolic Panel: Recent Labs  Lab 07/13/21 0447 07/14/21 0449 07/15/21 0436 07/17/21 0535 07/18/21 0452 07/19/21 0453  NA 127* 127* 127* 125* 126* 128*  K 4.4 4.4 4.4 4.0 4.4 4.4  CL 93* 93* 94* 90* 93* 94*  CO2 24 25 24 24 25 25   GLUCOSE 157* 159* 146* 129* 176* 159*  BUN 25* 27* 24* 23 25* 23  CREATININE 0.54* 0.65 0.49* 0.57* 0.49* 0.70  CALCIUM 8.9 8.9 8.7* 8.6* 8.3* 8.6*  MG 2.1 1.9  --  1.9  --   --   PHOS 4.1 4.4  --  4.1  --   --    Liver Function Tests: No results for input(s): AST, ALT, ALKPHOS, BILITOT, PROT, ALBUMIN in the last 168 hours. No results for input(s): LIPASE, AMYLASE in the last 168 hours. No results for input(s): AMMONIA in the last 168 hours. CBC: Recent Labs  Lab 07/14/21 0449 07/15/21 0436 07/16/21 0558 07/17/21 0535 07/19/21 0453  WBC 19.4* 18.8* 20.7* 22.2* 15.7*  HGB 13.8 14.1 13.7 13.8 13.7  HCT 40.3 42.0 41.0 41.8 40.9  MCV 82.6 83.7 83.2 82.9 82.8  PLT 353 336 338 364 307   Cardiac Enzymes: No results for input(s): CKTOTAL, CKMB, CKMBINDEX, TROPONINI in the last 168 hours. BNP: Invalid input(s): POCBNP CBG: Recent Labs  Lab 07/18/21 0749 07/18/21 1150 07/18/21 1647 07/18/21 2121 07/19/21 0740  GLUCAP 143* 187* 144* 201* 120*   D-Dimer No results for input(s): DDIMER in the last 72 hours. Hgb  A1c No results for input(s): HGBA1C in the last 72 hours. Lipid Profile No results for input(s): CHOL, HDL, LDLCALC, TRIG, CHOLHDL, LDLDIRECT in the last 72 hours. Thyroid function studies No results for input(s): TSH, T4TOTAL, T3FREE, THYROIDAB in the last 72 hours.  Invalid input(s): FREET3 Anemia work up No results for input(s): VITAMINB12, FOLATE, FERRITIN, TIBC, IRON, RETICCTPCT in the last 72 hours. Urinalysis    Component Value Date/Time   COLORURINE YELLOW 07/02/2021 0045   APPEARANCEUR HAZY (A) 07/02/2021 0045   LABSPEC 1.023 07/02/2021 0045   PHURINE 5.0 07/02/2021 0045   GLUCOSEU NEGATIVE 07/02/2021 0045   HGBUR NEGATIVE 07/02/2021  0045   BILIRUBINUR NEGATIVE 07/02/2021 0045   KETONESUR NEGATIVE 07/02/2021 0045   PROTEINUR NEGATIVE 07/02/2021 0045   NITRITE NEGATIVE 07/02/2021 0045   LEUKOCYTESUR TRACE (A) 07/02/2021 0045   Sepsis Labs Invalid input(s): PROCALCITONIN,  WBC,  LACTICIDVEN Microbiology No results found for this or any previous visit (from the past 240 hour(s)).   Time coordinating discharge: Over 30 minutes  SIGNED:   Shawna Clamp, MD  [facility] 07/19/2021, 11:26 AM Pager   If 7PM-7AM, please contact night-coverage

## 2021-07-19 NOTE — Progress Notes (Signed)
Physical Therapy Treatment Patient Details Name: Stephen Diaz MRN: [id number] DOB: November 25, 1955 Today's Date: 07/19/2021   History of Present Illness Pt is a 66 y.o. male returning to [facility] on 1/25 after discharge on 1/24 for recent admission for progressively worsening Lt sided weakness. CT scan of brain significant for 3 masses (Rt and Lt) up to 3 cm in diameter with vasogenic edema present mass effect on the lateral ventricles, but no midline shift. CT and MRI in ED negative for acute intracranial findings such as CVA. 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    PT Comments    General Comments: AxO x 3 very motivated and pleasant.  NT in room taking vitals.  Assisted with putting pants on per pt request.  Also applied shirt and L UE sling.  All though L LE is showing improvement his L UE remains flaccid.  First performed some pre gait standing activities.  Lateral side to side weight shift showed increased L knee stability.  Performed some R LE marching in standing. L knee did not buckle.  Then assisted with gait training.  General Gait Details: pt present with increased knee stability.  KI no longer needed.  Pre gait weightshifting and performing R LE marching in status shows increased L knee stability/self support.  Still trace to zero ankle involvement so ACE wrapped L ankle into DorsiFlexion.  Pt was able to amb 3 short distances (8 feet x 3) with second assist following with recliner.  Pt did require use of B platform EVA walker for increased stability.  WBing through B elbows on walker pads and Therapist adjusting balance to midline, pt was able to amb a total of 24 feet with manual support for upright/midline posture withing walker and facilitation/advancement L LE 35% of time.  75% VC's for proper L LE placement to avoid excessive steppage and to avoid L E crossing  over midline as his  ADductors are stronger than his flexors.  Tolerated well but does fatigue due to effort.  Progressing. Assisted back to bed per pt request to "rest". "You wore me out".   Pt will need ST Rehab at SNF to address his decline in functional mobility prior to returning home alone.    Recommendations for follow up therapy are one component of a multi-disciplinary discharge planning process, led by the attending physician.  Recommendations may be updated based on patient status, additional functional criteria and insurance authorization.  Follow Up Recommendations  Skilled nursing-short term rehab (<3 hours/day)     Assistance Recommended at Discharge Frequent or constant Supervision/Assistance  Patient can return home with the following Two people to help with walking and/or transfers;Assistance with cooking/housework;Assist for transportation;Help with stairs or ramp for entrance;Two people to help with bathing/dressing/bathroom   Equipment Recommendations  Wheelchair (measurements PT);Wheelchair cushion (measurements PT)    Recommendations for Other Services       Precautions / Restrictions Precautions Precautions: Fall Precaution Comments: 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  Good trunk stability.  Able to sit EOB Indep.  Unable to functionally use L UE (flaccid).  Has a sling for OOB support.    Transfers Overall transfer level: Needs assistance Equipment used: 1 person hand held assist Transfers: Sit to/from Stand Sit to  Stand: Mod assist Stand pivot transfers: Mod assist         General transfer comment: first perfromed pre gait dynamic standing at bed side with Therapist in front blocking L knee while pt performed lateral side to side weight shifting x 2 min each time (performed twice).  Pt has good trunk and hip stabelizers.  showing improved knee strength.  KI no longer needed.    Ambulation/Gait Ambulation/Gait assistance: Mod assist, Max assist Gait Distance (Feet): 24 Feet Assistive device: Bilateral platform walker Gait Pattern/deviations: Step-to pattern, Step-through pattern, Knees buckling Gait velocity: decreased     General Gait Details: pt present with increased knee stability.  KI no longer needed.  Pre gait weightshifting and performing R LE marching in status shows increased L knee stability/self support.  Still trace to zero ankle involvement so ACE wrapped L ankle into DorsiFlexion.  Pt was able to amb 3 short distances (8 feet x 3) with second assist following with recliner.  Pt did require use of B platform EVA walker for increased stability.  WBing through B elbows on walker pads and Therapist adjusting balance to midline, pt was able to amb a total of 24 feet with manual support for upright/midline posture withing walker and facilitation/advancement L LE 35% of time.  75% VC's for proper L LE placement to avoid excessive steppage and to avoid L E crossing over midline as his  ADductors are stronger than his flexors.  Tolerated well but does fatigue due to effort.  Progressing.   Stairs             Wheelchair Mobility    Modified Rankin (Stroke Patients Only)       Balance                                            Cognition Arousal/Alertness: Awake/alert                                     General Comments: AxO x 3 very motivated and pleasant        Exercises      General Comments        Pertinent Vitals/Pain Pain  Assessment Pain Assessment: No/denies pain    Home Living                          Prior Function            PT Goals (current goals can now be found in the care plan section) Progress towards PT goals: Progressing toward goals    Frequency    Min 3X/week      PT Plan Current plan remains appropriate;Equipment recommendations need to be updated    Co-evaluation              AM-PAC PT "6 Clicks" Mobility   Outcome Measure  Help needed turning from your back to your side while in  a flat bed without using bedrails?: A Lot Help needed moving from lying on your back to sitting on the side of a flat bed without using bedrails?: A Lot Help needed moving to and from a bed to a chair (including a wheelchair)?: A Lot Help needed standing up from a chair using your arms (e.g., wheelchair or bedside chair)?: A Lot Help needed to walk in hospital room?: Total Help needed climbing 3-5 steps with a railing? : Total 6 Click Score: 10    End of Session Equipment Utilized During Treatment: Gait belt Activity Tolerance: Patient tolerated treatment well;Patient limited by fatigue Patient left: with call bell/phone within reach;in bed;with bed alarm set Nurse Communication: Mobility status PT Visit Diagnosis: Difficulty in walking, not elsewhere classified (R26.2);Unsteadiness on feet (R26.81);Hemiplegia and hemiparesis Hemiplegia - Right/Left: Left Hemiplegia - dominant/non-dominant: Non-dominant     Time: 5366-4403 PT Time Calculation (min) (ACUTE ONLY): 28 min  Charges:  $Gait Training: 8-22 mins $Therapeutic Activity: 8-22 mins                     Rica Koyanagi  PTA Acute  Rehabilitation Services Pager      [phone] Office      [phone]

## 2021-07-19 NOTE — Plan of Care (Signed)

## 2021-07-19 NOTE — Progress Notes (Signed)
Oncology Nurse Navigator Documentation  Oncology Nurse Navigator Flowsheets 07/19/2021 07/19/2021 07/05/2021 07/04/2021 06/21/2021 06/14/2021  Abnormal Finding Date - - - - 04/22/2021 -  Confirmed Diagnosis Date - - - - 05/20/2021 -  Diagnosis Status - - - - Confirmed Diagnosis Complete -  Planned Course of Treatment - - - - Surgery -  Phase of Treatment - - - - Surgery -  Surgery Actual Start Date: - - - - 05/20/2021 -  Navigator Follow Up Date: - 08/02/2021 07/19/2021 07/05/2021 - 06/21/2021  Navigator Follow Up Reason: - Follow-up Appointment Follow-up Appointment Pathology - New Patient Appointment  Navigator Location [facility] [facility] [facility] [facility] [facility] [facility]  Referral Date to RadOnc/MedOnc - - - - - 06/14/2021  Navigator Encounter Type Inpatient Inpatient Inpatient Inpatient Clinic/MDC Other:  Treatment Initiated Date - - - - 05/20/2021 -  Patient Visit Type Inpatient - - - Initial;MedOnc Other  Treatment Phase - - - - Follow-up Other  Barriers/Navigation Needs Coordination of Care/I received a message from hospital CM.  She was checking to see if Mr. Hawkin's XRT could be sooner so he can get to SNF. I called [facility] and they will check with XRT navigator.  I updated CM.  Coordination of Care Coordination of Care Coordination of Care Education Coordination of Care  Education - - - - Newly Diagnosed Cancer Education;Other -  Interventions Coordination of Care Coordination of Care Coordination of Care Coordination of Care Education;Psycho-Social Support Coordination of Care  Acuity Level 3-Moderate Needs (3-4 Barriers Identified) Level 2-Minimal Needs (1-2 Barriers Identified) Level 2-Minimal Needs (1-2 Barriers Identified) Level 2-Minimal Needs (1-2 Barriers Identified) Level 3-Moderate Needs (3-4 Barriers Identified) Level 2-Minimal Needs (1-2 Barriers Identified)  Coordination of Care Other Appts Pathology Other - Other  Education Method - - - -  Verbal;Written -  Time Spent with Patient 30 30 30 30  45 30

## 2021-07-19 NOTE — Progress Notes (Signed)
Rn called report to [employer] nurse

## 2021-07-19 NOTE — Progress Notes (Signed)
On call provider was notified around [place] that patient's heart rate dropped down to 49 and non-sustained.

## 2021-07-19 NOTE — Progress Notes (Signed)
Oncology Nurse Navigator Documentation  Oncology Nurse Navigator Flowsheets 07/19/2021 07/05/2021 07/04/2021 06/21/2021 06/14/2021  Abnormal Finding Date - - - 04/22/2021 -  Confirmed Diagnosis Date - - - 05/20/2021 -  Diagnosis Status - - - Confirmed Diagnosis Complete -  Planned Course of Treatment - - - Surgery -  Phase of Treatment - - - Surgery -  Surgery Actual Start Date: - - - 05/20/2021 -  Navigator Follow Up Date: 08/02/2021 07/19/2021 07/05/2021 - 06/21/2021  Navigator Follow Up Reason: Follow-up Appointment Follow-up Appointment Pathology - New Patient Appointment  Navigator Location [facility] [facility] [facility] [facility]  Referral Date to RadOnc/MedOnc - - - - 06/14/2021  Navigator Encounter Type Inpatient Inpatient Inpatient Clinic/MDC Other:  Treatment Initiated Date - - - 05/20/2021 -  Patient Visit Type - - - Initial;MedOnc Other  Treatment Phase - - - Follow-up Other  Barriers/Navigation Needs Coordination of Care/Patient is in the hospital today.  His appt with med onc is re-scheduled at this time.  Coordination of Care Coordination of Care Education Coordination of Care  Education - - - Newly Diagnosed Cancer Education;Other -  Interventions Coordination of Care Coordination of Care Coordination of Care Education;Psycho-Social Support Coordination of Care  Acuity Level 2-Minimal Needs (1-2 Barriers Identified) Level 2-Minimal Needs (1-2 Barriers Identified) Level 2-Minimal Needs (1-2 Barriers Identified) Level 3-Moderate Needs (3-4 Barriers Identified) Level 2-Minimal Needs (1-2 Barriers Identified)  Coordination of Care Appts Pathology Other - Other  Education Method - - - Verbal;Written -  Time Spent with Patient 30 30 30  45 30

## 2021-07-20 ENCOUNTER — Encounter: Payer: Self-pay | Admitting: *Deleted

## 2021-07-20 ENCOUNTER — Telehealth: Payer: Self-pay | Admitting: Radiation Therapy

## 2021-07-20 NOTE — Progress Notes (Signed)
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 The two larger lesions were treated in 3 fractions, and the smaller of the three was treated with 1 fraction. He tolerated radiotherapy, however was significantly limited with his movement of the left upper and lower extremity and continued Dexamethasone 4 mg TID.  Plan: The patient will receive a call in about one month from the radiation oncology department. He will be followed in the brain and spine oncology program with Dr. Mickeal Skinner. He will continue follow up with Dr. Julien Nordmann as well.  ________________________________________________    Carola Rhine, [facility]

## 2021-07-20 NOTE — Progress Notes (Signed)
Oncology Nurse Navigator Documentation  Oncology Nurse Navigator Flowsheets 07/20/2021 07/19/2021 07/19/2021 07/05/2021 07/04/2021 06/21/2021 06/14/2021  Abnormal Finding Date - - - - - 04/22/2021 -  Confirmed Diagnosis Date - - - - - 05/20/2021 -  Diagnosis Status - - - - - Confirmed Diagnosis Complete -  Planned Course of Treatment - - - - - Surgery -  Phase of Treatment - - - - - Surgery -  Surgery Actual Start Date: - - - - - 05/20/2021 -  Navigator Follow Up Date: - - 08/02/2021 07/19/2021 07/05/2021 - 06/21/2021  Navigator Follow Up Reason: - - Follow-up Appointment Follow-up Appointment Pathology - New Patient Appointment  Navigator Location [facility] [facility] [facility] [facility] [facility] [facility] [facility]  Referral Date to RadOnc/MedOnc - - - - - - 06/14/2021  Navigator Encounter Type Telephone Inpatient Inpatient Inpatient Inpatient Clinic/MDC Other:  Telephone Outgoing Call - - - - - -  Treatment Initiated Date - - - - - 05/20/2021 -  Patient Visit Type Other Inpatient - - - Initial;MedOnc Other  Treatment Phase - - - - - Follow-up Other  Barriers/Navigation Needs Coordination of Care/I followed up on Stephen Diaz schedule. He has a few appts for oncology set up. I called [facility] to update their transportation service. I was unable to reach but did leave vm message with my name and phone number to call.  Coordination of Care Coordination of Care Coordination of Care Coordination of Care Education Coordination of Care  Education - - - - - Newly Diagnosed Cancer Education;Other -  Interventions Coordination of Care Coordination of Care Coordination of Care Coordination of Care Coordination of Care Education;Psycho-Social Support Coordination of Care  Acuity Level 2-Minimal Needs (1-2 Barriers Identified) Level 3-Moderate Needs (3-4 Barriers Identified) Level 2-Minimal Needs (1-2 Barriers Identified) Level 2-Minimal Needs (1-2 Barriers Identified) Level  2-Minimal Needs (1-2 Barriers Identified) Level 3-Moderate Needs (3-4 Barriers Identified) Level 2-Minimal Needs (1-2 Barriers Identified)  Coordination of Care Other Other Appts Pathology Other - Other  Education Method - - - - - Verbal;Written -  Time Spent with Patient 15 30 30 30 30  45 30

## 2021-07-20 NOTE — Telephone Encounter (Signed)
Bryson Ha requested pt see Dr. Mickeal Skinner for his steroid management and neuro evaluation after completion of Brain SRT course.  Mr. Holloway has been added to the schedule on 2/23 since he will already be here to see Dr. Julien Nordmann that morning as well. I called the [facility] facility to update his transportation for that day, had to leave a voicemail with appt details.   Mont Dutton R.T.(R)(T) Radiation Special Procedures Navigator

## 2021-07-20 NOTE — Progress Notes (Signed)
I received a call back from patients rehab facility. They were unaware of his follow up appt to see Dr. Julien Nordmann. They needed a new appt date.  I gave them a new appt time.

## 2021-07-21 ENCOUNTER — Ambulatory Visit: Payer: Medicare Other | Admitting: Radiation Oncology

## 2021-07-25 NOTE — Progress Notes (Signed)
Synopsis: Referred for lung nodule by [name], PA-C  Subjective:   PATIENT ID: Stephen Diaz GENDER: male DOB: 1955/09/13, MRN: [id number]  Chief Complaint  Patient presents with   Follow-up    Pt states he has not been doing too well since last visit as he was recently diagnosed with brain cancer. States that he becomes SOB and has an occasional cough.    65yM with history of COPD on trelegy, smoking ~50 py referred for lung nodule  He doesn't have much of a cough. No fever. No recent pneumonia. No CP. No night sweats drenching sheets. No unintentional weight loss.   He has some DOE, trelegy has helped quite a bit. He has had possibly one exacerbation of COPD this year requiring prednisone, didn't require hospitalization. Has never been hospitalized for COPD. If he's taken his trelegy he could climb about 4 flights of stairs without having to stop due to dyspnea.   Aunt may have died from lung cancer. No other lung disease  He was a Multimedia programmer. Retired now, retired [employer]. No current exposures to dusts/solvents/particulates without a mask over these past 4 years. He has no pets. Has never lived outside of [place]. 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  He is in rehab facility at [facility] in [place].   He has a little bit of a cough but nothing too bad.  He says he's getting some kind of mist inhaler that sounds like a respimat that he doesn't find very helpful. He says he thinks trelegy was more effective.   Otherwise pertinent review of systems is negative.    Past Medical History:  Diagnosis Date   Arthritis    right shoulder   Cancer (HCC)    COPD (chronic obstructive pulmonary disease) (HCC)    Diabetes mellitus without complication  (HCC)    Type II   Hypertension    Right foot drop    from pinched nerve in back, wears brace     Family History  Problem Relation Age of Onset   Crohn's disease Cousin    Colon cancer Neg Hx      Past Surgical History:  Procedure Laterality Date   CATARACT EXTRACTION Left    CATARACT EXTRACTION W/PHACO Right 08/13/2020   Procedure: CATARACT EXTRACTION PHACO AND INTRAOCULAR LENS PLACEMENT ([place]);  Surgeon: Baruch Goldmann, MD;  Location: [facility];  Service: Ophthalmology;  Laterality: Right;  CDE: 8.24   COLONOSCOPY  12/2020   Dr. Adelina Mings (general surgeon): diverticulsos and left sided colitis with erythema in the sigmoid and descending colon. bx: focal mild acute colitis ?infection vs ischemia   COLONOSCOPY  10/2009   Dr. Carlyon Prows [place]: diverticulosis, two tubular adenomas removed. descending colon erythema and mild ulcerations noted with benign biopsies.   EYE SURGERY     HERNIA REPAIR     INTERCOSTAL NERVE BLOCK Right 05/20/2021   Procedure: INTERCOSTAL NERVE BLOCK;  Surgeon: Melrose Nakayama, MD;  Location: [facility];  Service: Thoracic;  Laterality: Right;   LUNG REMOVAL, PARTIAL Right    LYMPH NODE DISSECTION Right 05/20/2021   Procedure: LYMPH NODE DISSECTION;  Surgeon: Melrose Nakayama, MD;  Location: [facility];  Service: Thoracic;  Laterality: Right;   REVERSE SHOULDER ARTHROPLASTY Right 09/08/2020   Procedure: REVERSE TOTAL  SHOULDER ARTHROPLASTY;  Surgeon: Hiram Gash, MD;  Location: [facility];  Service: Orthopedics;  Laterality: Right;   screws and steel plate in neck  (D3-5)     10-12 years ago  x2    Social History   Socioeconomic History   Marital status: Widowed    Spouse name: Not on file   Number of children: Not on file   Years of education: Not on file   Highest education level: Not on file  Occupational History   Not on file  Tobacco Use   Smoking status: Former    Packs/day: 1.00    Years: 49.00    Pack years: 49.00     Types: Cigarettes    Start date: 74    Quit date: 05/2021    Years since quitting: 0.2   Smokeless tobacco: Never  Vaping Use   Vaping Use: Never used  Substance and Sexual Activity   Alcohol use: Not Currently   Drug use: Not Currently   Sexual activity: Not Currently  Other Topics Concern   Not on file  Social History Narrative   Not on file   Social Determinants of Health   Financial Resource Strain: Not on file  Food Insecurity: Not on file  Transportation Needs: Not on file  Physical Activity: Not on file  Stress: Not on file  Social Connections: Not on file  Intimate Partner Violence: Not on file     No Known Allergies   Outpatient Medications Prior to Visit  Medication Sig Dispense Refill   albuterol (VENTOLIN HFA) 108 (90 Base) MCG/ACT inhaler Inhale 2 puffs into the lungs every 6 (six) hours as needed for wheezing or shortness of breath.     aspirin 81 MG EC tablet Take 81 mg by mouth daily.     dexamethasone (DECADRON) 4 MG tablet Take 1 tablet (4 mg total) by mouth 3 (three) times daily. 90 tablet 2   insulin lispro (HUMALOG) 200 UNIT/ML KwikPen Inject 2-10 Units into the skin 3 (three) times daily before meals. Take 2 units for glucose 150-200, 4 units for glucose 201-250, 6 units for glucose 251-300, 8 units for glucose 201-350 and 10 units for glucose 351-400. 9 mL 2   Insulin Pen Needle (PEN NEEDLES 3/16") 31G X 5 MM MISC Use as directed with insulin pen 100 each 2   levETIRAcetam (KEPPRA) 500 MG tablet Take 1 tablet (500 mg total) by mouth 2 (two) times daily. 60 tablet 0   lisinopril (ZESTRIL) 5 MG tablet Take 1 tablet (5 mg total) by mouth daily. 30 tablet 1   metFORMIN (GLUCOPHAGE) 500 MG tablet Take 1,000 mg by mouth daily with breakfast.     oxyCODONE (OXY IR/ROXICODONE) 5 MG immediate release tablet Take 1 tablet (5 mg total) by mouth every 4 (four) hours as needed for moderate pain. 10 tablet 0   sodium chloride 1 g tablet Take 1 tablet (1 g total) by  mouth 3 (three) times daily with meals. 60 tablet 0   tamsulosin (FLOMAX) 0.4 MG CAPS capsule Take 1 capsule (0.4 mg total) by mouth daily after breakfast. 30 capsule 2   Multiple Vitamins-Minerals (CENTRUM SILVER 50+MEN) TABS Take 1 tablet by mouth daily. (Patient not taking: Reported on 07/27/2021)     TRELEGY ELLIPTA 100-62.5-25 MCG/INH AEPB Inhale 1 puff into the lungs daily. (Patient not taking: Reported on 07/27/2021)     No facility-administered medications prior to visit.       Objective:  Physical Exam:  General appearance: 66 y.o., male, NAD, conversant, chronically ill appearing  Eyes: anicteric sclerae; PERRL, tracking appropriately HENT: NCAT; MMM Neck: Trachea midline; no lymphadenopathy, no JVD Lungs: diminished bl, no crackles, no wheeze, with normal respiratory effort CV: RRR, no murmur  Abdomen: Soft, non-tender; non-distended, BS present  Extremities: No peripheral edema, warm Skin: Normal turgor and texture; no rash Psych: Appropriate affect Neuro: Alert and oriented to person and place, no focal deficit     Vitals:   07/27/21 1027  BP: 112/60  Pulse: 89  SpO2: 97%  Weight: 178 lb 12.8 oz (81.1 kg)  Height: 6\' 1"  (1.854 m)     97% on RA BMI Readings from Last 3 Encounters:  07/27/21 23.59 kg/m  07/06/21 23.65 kg/m  07/05/21 24.35 kg/m   Wt Readings from Last 3 Encounters:  07/27/21 178 lb 12.8 oz (81.1 kg)  07/06/21 179 lb 3.7 oz (81.3 kg)  07/05/21 184 lb 8.4 oz (83.7 kg)     CBC    Component Value Date/Time   WBC 15.7 (H) 07/19/2021 0453   RBC 4.94 07/19/2021 0453   HGB 13.7 07/19/2021 0453   HGB 12.8 (L) 06/21/2021 1332   HCT 40.9 07/19/2021 0453   PLT 307 07/19/2021 0453   PLT 423 (H) 06/21/2021 1332   MCV 82.8 07/19/2021 0453   MCH 27.7 07/19/2021 0453   MCHC 33.5 07/19/2021 0453   RDW 14.6 07/19/2021 0453   LYMPHSABS 3.7 07/02/2021 2003   MONOABS 1.0 07/02/2021 2003   EOSABS 0.3 07/02/2021 2003   BASOSABS 0.1 07/02/2021  2003      Chest Imaging:  CT Chest 03/11/21 reviewed by me remarkable for 2.8cm part solid/cystic nodule RLL superior segment. Borderline 4R, 4L adenopathy  PET/CT 10/24 with hypermetabolic cystic/cavitary RLL lesion, hypermetabolic nodules in groin, peri-rectal area  CT CAP 07/03/21 reviewed by me: s/p RLL lobectomy. Emphysema.  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   # Smoking: has stopped since diagnosis  Plan: - restart trelegy 100 1 puff once daily, rinse mouth after use - CT surveillance has been ordered for 12/2021 - RTC 6 months  - has already had flu and covid booster shots      Maryjane Hurter, MD [facility] 07/27/2021 10:47 AM

## 2021-07-26 ENCOUNTER — Ambulatory Visit: Payer: Medicare Other | Admitting: Student

## 2021-07-27 ENCOUNTER — Encounter: Payer: Self-pay | Admitting: Student

## 2021-07-27 ENCOUNTER — Ambulatory Visit (INDEPENDENT_AMBULATORY_CARE_PROVIDER_SITE_OTHER): Payer: Medicare Other | Admitting: Student

## 2021-07-27 ENCOUNTER — Other Ambulatory Visit: Payer: Self-pay

## 2021-07-27 VITALS — BP 112/60 | HR 89 | Ht 73.0 in | Wt 178.8 lb

## 2021-07-27 DIAGNOSIS — C3490 Malignant neoplasm of unspecified part of unspecified bronchus or lung: Secondary | ICD-10-CM | POA: Diagnosis not present

## 2021-07-27 DIAGNOSIS — J449 Chronic obstructive pulmonary disease, unspecified: Secondary | ICD-10-CM | POA: Diagnosis not present

## 2021-07-27 MED ORDER — TRELEGY ELLIPTA 100-62.5-25 MCG/ACT IN AEPB: 1.0000 | INHALATION_SPRAY | Freq: Every day | RESPIRATORY_TRACT | 0 refills | Status: AC

## 2021-07-27 MED ORDER — TRELEGY ELLIPTA 100-62.5-25 MCG/ACT IN AEPB: 1.0000 | INHALATION_SPRAY | Freq: Every day | RESPIRATORY_TRACT | 11 refills | Status: AC

## 2021-07-27 NOTE — Patient Instructions (Addendum)
Seen for COPD gold functional group B, restarted on trelegy 1 puff once daily   - restart trelegy 1 puff once daily, rinse mouth after use - follow up ct chest should be scheduled in July for post treatment surveillance - see you in 6 months!

## 2021-07-27 NOTE — Addendum Note (Signed)
Addended by: Lorretta Harp on: 07/27/2021 11:00 AM   Modules accepted: Orders

## 2021-08-01 ENCOUNTER — Encounter: Payer: Self-pay | Admitting: *Deleted

## 2021-08-01 NOTE — Progress Notes (Signed)
Oncology Nurse Navigator Documentation  Oncology Nurse Navigator Flowsheets 08/01/2021 07/20/2021 07/19/2021 07/19/2021 07/05/2021 07/04/2021 06/21/2021  Abnormal Finding Date 04/22/2021 - - - - - 04/22/2021  Confirmed Diagnosis Date 05/20/2021 - - - - - 05/20/2021  Diagnosis Status - - - - - - Confirmed Diagnosis Complete  Planned Course of Treatment Radiation - - - - - Surgery  Phase of Treatment Radiation - - - - - Surgery  Radiation Actual Start Date: 07/08/2021 - - - - - -  Radiation Actual End Date: 07/19/2021 - - - - - -  Surgery Actual Start Date: - - - - - - 05/20/2021  Navigator Follow Up Date: 08/04/2021 - - 08/02/2021 07/19/2021 07/05/2021 -  Navigator Follow Up Reason: Follow-up Appointment - - Follow-up Appointment Follow-up Appointment Pathology -  Navigator Location [facility]  Referral Date to RadOnc/MedOnc - - - - - - -  Navigator Encounter Type Telephone Telephone Inpatient Inpatient Inpatient Inpatient Clinic/[facility]  Telephone Outgoing Call Outgoing Call - - - - -  Treatment Initiated Date 05/20/2021 - - - - - 05/20/2021  Patient Visit Type Other Other Inpatient - - - Initial;MedOnc  Treatment Phase Pre-Tx/Tx Discussion - - - - - Follow-up  Barriers/Navigation Needs Coordination of Care/I called [facility] to remind them about patient's appt this week. I was unable to reach but did leave a vm message with my name and phone number to call.  Coordination of Care Coordination of Care Coordination of Care Coordination of Care Coordination of Care Education  Education - - - - - - Newly Diagnosed Cancer Education;Other  Interventions Coordination of Care Coordination of Care Coordination of Care Coordination of Care Coordination of Care Coordination of Care Education;Psycho-Social Support  Acuity Level 2-Minimal Needs (1-2 Barriers Identified) Level 2-Minimal Needs (1-2 Barriers Identified) Level  3-Moderate Needs (3-4 Barriers Identified) Level 2-Minimal Needs (1-2 Barriers Identified) Level 2-Minimal Needs (1-2 Barriers Identified) Level 2-Minimal Needs (1-2 Barriers Identified) Level 3-Moderate Needs (3-4 Barriers Identified)  Coordination of Care Other Other Other Appts Pathology Other -  Education Method - - - - - - Verbal;Written  Time Spent with Patient 15 15 30 30 30 30  45

## 2021-08-02 ENCOUNTER — Other Ambulatory Visit: Payer: Medicare Other

## 2021-08-02 ENCOUNTER — Ambulatory Visit: Payer: Medicare Other | Admitting: Internal Medicine

## 2021-08-03 ENCOUNTER — Other Ambulatory Visit: Payer: Self-pay

## 2021-08-03 ENCOUNTER — Telehealth: Payer: Self-pay | Admitting: Gastroenterology

## 2021-08-03 DIAGNOSIS — C3491 Malignant neoplasm of unspecified part of right bronchus or lung: Secondary | ICD-10-CM

## 2021-08-03 NOTE — Telephone Encounter (Signed)
Follow up on patient status since ov via chart review.  Also discussed colonoscopy findings with Dr. Abbey Chatters, Color pictures received and reviewed.  Mild focal acute colitis, could have been possibly prep related.  Patient denied any symptoms of colitis. When we saw him in the office he had recently been diagnosed with lung cancer.  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  All 7 lymph nodes were negative. Previous PET scan also showed suspicious skin lesion in the gluteal area and scrotum. For suspicious skin lesions he was referred to dermatology. Unfortunately he also has been diagnosed with metastatic disease to the brain, hospitalized last month.  Undergoing radiation treatment.  Given patient was asymptomatic from a GI standpoint, pathology did not show any chronic colitis, would simply follow for any onset of chronic diarrhea or blood in the stool.  Would recommend avoiding NSAIDs.  Patient previously advised of alarm symptoms for which he should report.  He is aware to return to our office as needed.

## 2021-08-04 ENCOUNTER — Inpatient Hospital Stay: Payer: Medicare Other

## 2021-08-04 ENCOUNTER — Inpatient Hospital Stay (HOSPITAL_BASED_OUTPATIENT_CLINIC_OR_DEPARTMENT_OTHER): Payer: Medicare Other | Admitting: Internal Medicine

## 2021-08-04 ENCOUNTER — Other Ambulatory Visit: Payer: Self-pay

## 2021-08-04 ENCOUNTER — Inpatient Hospital Stay: Payer: Medicare Other | Attending: Internal Medicine | Admitting: Internal Medicine

## 2021-08-04 DIAGNOSIS — Z5112 Encounter for antineoplastic immunotherapy: Secondary | ICD-10-CM

## 2021-08-04 DIAGNOSIS — C7931 Secondary malignant neoplasm of brain: Secondary | ICD-10-CM

## 2021-08-04 DIAGNOSIS — Z5111 Encounter for antineoplastic chemotherapy: Secondary | ICD-10-CM | POA: Insufficient documentation

## 2021-08-04 DIAGNOSIS — L989 Disorder of the skin and subcutaneous tissue, unspecified: Secondary | ICD-10-CM | POA: Insufficient documentation

## 2021-08-04 DIAGNOSIS — C3431 Malignant neoplasm of lower lobe, right bronchus or lung: Secondary | ICD-10-CM | POA: Insufficient documentation

## 2021-08-04 DIAGNOSIS — C3491 Malignant neoplasm of unspecified part of right bronchus or lung: Secondary | ICD-10-CM

## 2021-08-04 DIAGNOSIS — Z902 Acquired absence of lung [part of]: Secondary | ICD-10-CM | POA: Insufficient documentation

## 2021-08-04 LAB — CBC WITH DIFFERENTIAL (CANCER CENTER ONLY)
Abs Immature Granulocytes: 0.34 10*3/uL — ABNORMAL HIGH (ref 0.00–0.07)
Basophils Absolute: 0 10*3/uL (ref 0.0–0.1)
Basophils Relative: 0 %
Eosinophils Absolute: 0 10*3/uL (ref 0.0–0.5)
Eosinophils Relative: 0 %
HCT: 34.5 % — ABNORMAL LOW (ref 39.0–52.0)
Hemoglobin: 11.6 g/dL — ABNORMAL LOW (ref 13.0–17.0)
Immature Granulocytes: 3 %
Lymphocytes Relative: 11 %
Lymphs Abs: 1.1 10*3/uL (ref 0.7–4.0)
MCH: 27.9 pg (ref 26.0–34.0)
MCHC: 33.6 g/dL (ref 30.0–36.0)
MCV: 82.9 fL (ref 80.0–100.0)
Monocytes Absolute: 0.7 10*3/uL (ref 0.1–1.0)
Monocytes Relative: 7 %
Neutro Abs: 8 10*3/uL — ABNORMAL HIGH (ref 1.7–7.7)
Neutrophils Relative %: 79 %
Platelet Count: 149 10*3/uL — ABNORMAL LOW (ref 150–400)
RBC: 4.16 MIL/uL — ABNORMAL LOW (ref 4.22–5.81)
RDW: 15.6 % — ABNORMAL HIGH (ref 11.5–15.5)
WBC Count: 10.1 10*3/uL (ref 4.0–10.5)
nRBC: 0 % (ref 0.0–0.2)

## 2021-08-04 LAB — CMP (CANCER CENTER ONLY)
ALT: 29 U/L (ref 0–44)
AST: 14 U/L — ABNORMAL LOW (ref 15–41)
Albumin: 2.9 g/dL — ABNORMAL LOW (ref 3.5–5.0)
Alkaline Phosphatase: 76 U/L (ref 38–126)
Anion gap: 10 (ref 5–15)
BUN: 14 mg/dL (ref 8–23)
CO2: 27 mmol/L (ref 22–32)
Calcium: 8.5 mg/dL — ABNORMAL LOW (ref 8.9–10.3)
Chloride: 93 mmol/L — ABNORMAL LOW (ref 98–111)
Creatinine: 0.47 mg/dL — ABNORMAL LOW (ref 0.61–1.24)
GFR, Estimated: >60 mL/min (ref >60)
Glucose, Bld: 316 mg/dL — ABNORMAL HIGH (ref 70–99)
Potassium: 4.3 mmol/L (ref 3.5–5.1)
Sodium: 130 mmol/L — ABNORMAL LOW (ref 135–145)
Total Bilirubin: 0.3 mg/dL (ref 0.3–1.2)
Total Protein: 5.9 g/dL — ABNORMAL LOW (ref 6.5–8.1)

## 2021-08-04 MED ORDER — PROCHLORPERAZINE MALEATE 10 MG PO TABS: 10.0000 mg | ORAL_TABLET | Freq: Four times a day (QID) | ORAL | 0 refills | Status: AC | PRN

## 2021-08-04 MED ORDER — DEXAMETHASONE 4 MG PO TABS
4.0000 mg | ORAL_TABLET | Freq: Every day | ORAL | 2 refills | Status: AC
Start: 2021-08-04 — End: 2021-11-02

## 2021-08-04 NOTE — Progress Notes (Signed)
[place] [phone]   [phone]  OFFICE PROGRESS NOTE  [name], PA-C [place] [address]  DIAGNOSIS: Stage IV (T2 a, N0, Mb)) non-small cell lung cancer, squamous cell carcinoma presented initially as a stage Ib (T2 a, N0, M0) squamous cell carcinoma with keratinization presented with superior segment right lower lobe lung nodule in December 2022 and the patient was found to have metastatic disease to the brain in January 2023.  His PET scan at that time showed also suspicious skin lesion in the gluteal area and scrotum.  PD-L1 expression was 3%  PRIOR THERAPY:  1) Status post right lower lobectomy with lymph node dissection under the care of Dr. Roxan Hockey on May 20, 2021. 2) status post SRS treatment to 3 brain lesions under the care of Dr. Lisbeth Renshaw completed July 19, 2021.   CURRENT THERAPY: Systemic chemotherapy with carboplatin for AUC of 5, paclitaxel 175 Mg/M2 and Libtayo (Cempilimab) 350 Mg IV every 3 weeks.  First dose August 18, 2021.  INTERVAL HISTORY: Stephen Diaz 66 y.o. male returns to the clinic today for follow-up visit accompanied by his transportation person.  The patient is currently on a skilled nursing facility in [facility] for rehabilitation after he was found to have metastatic brain lesions status post [facility] under the care of Dr. Lisbeth Renshaw. The patient is feeling fine today except for weakness in the lower extremities.  He denied having any chest pain but has shortness of breath with exertion with no cough or hemoptysis.  He has no nausea, vomiting, diarrhea or constipation.  He has no headache or visual changes.  He has no recent weight loss or night sweats.  He is here today for evaluation and discussion of his treatment options based on the new brain findings.  MEDICAL HISTORY: Past Medical History:  Diagnosis Date   Arthritis    right shoulder   Cancer (HCC)    COPD (chronic obstructive  pulmonary disease) (HCC)    Diabetes mellitus without complication (HCC)    Type II   Hypertension    Right foot drop    from pinched nerve in back, wears brace    ALLERGIES:  has No Known Allergies.  MEDICATIONS:  Current Outpatient Medications  Medication Sig Dispense Refill   albuterol (VENTOLIN HFA) 108 (90 Base) MCG/ACT inhaler Inhale 2 puffs into the lungs every 6 (six) hours as needed for wheezing or shortness of breath.     aspirin 81 MG EC tablet Take 81 mg by mouth daily.     dexamethasone (DECADRON) 4 MG tablet Take 1 tablet (4 mg total) by mouth 3 (three) times daily. 90 tablet 2   Fluticasone-Umeclidin-Vilant (TRELEGY ELLIPTA) 100-62.5-25 MCG/ACT AEPB Inhale 1 puff into the lungs daily. 1 each 11   Fluticasone-Umeclidin-Vilant (TRELEGY ELLIPTA) 100-62.5-25 MCG/ACT AEPB Inhale 1 puff into the lungs daily. 14 each 0   insulin lispro (HUMALOG) 200 UNIT/ML KwikPen Inject 2-10 Units into the skin 3 (three) times daily before meals. Take 2 units for glucose 150-200, 4 units for glucose 201-250, 6 units for glucose 251-300, 8 units for glucose 201-350 and 10 units for glucose 351-400. 9 mL 2   Insulin Pen Needle (PEN NEEDLES 3/16") 31G X 5 MM MISC Use as directed with insulin pen 100 each 2   levETIRAcetam (KEPPRA) 500 MG tablet Take 1 tablet (500 mg total) by mouth 2 (two) times daily. 60 tablet 0   lisinopril (  ZESTRIL) 5 MG tablet Take 1 tablet (5 mg total) by mouth daily. 30 tablet 1   metFORMIN (GLUCOPHAGE) 500 MG tablet Take 1,000 mg by mouth daily with breakfast.     Multiple Vitamins-Minerals (CENTRUM SILVER 50+MEN) TABS Take 1 tablet by mouth daily.     oxyCODONE (OXY IR/ROXICODONE) 5 MG immediate release tablet Take 1 tablet (5 mg total) by mouth every 4 (four) hours as needed for moderate pain. 10 tablet 0   sodium chloride 1 g tablet Take 1 tablet (1 g total) by mouth 3 (three) times daily with meals. 60 tablet 0   tamsulosin (FLOMAX) 0.4 MG CAPS capsule Take 1 capsule (0.4  mg total) by mouth daily after breakfast. 30 capsule 2   traZODone (DESYREL) 50 MG tablet Take 50 mg by mouth at bedtime.     No current facility-administered medications for this visit.    SURGICAL HISTORY:  Past Surgical History:  Procedure Laterality Date   CATARACT EXTRACTION Left    CATARACT EXTRACTION W/PHACO Right 08/13/2020   Procedure: CATARACT EXTRACTION PHACO AND INTRAOCULAR LENS PLACEMENT (IOC);  Surgeon: Baruch Goldmann, MD;  Location: [facility];  Service: Ophthalmology;  Laterality: Right;  CDE: 8.24   COLONOSCOPY  12/2020   Dr. Adelina Mings (general surgeon): diverticulsos and left sided colitis with erythema in the sigmoid and descending colon. bx: focal mild acute colitis ?infection vs ischemia   COLONOSCOPY  10/2009   Dr. Carlyon Prows [place]: diverticulosis, two tubular adenomas removed. descending colon erythema and mild ulcerations noted with benign biopsies.   EYE SURGERY     HERNIA REPAIR     INTERCOSTAL NERVE BLOCK Right 05/20/2021   Procedure: INTERCOSTAL NERVE BLOCK;  Surgeon: Melrose Nakayama, MD;  Location: [place];  Service: Thoracic;  Laterality: Right;   LUNG REMOVAL, PARTIAL Right    LYMPH NODE DISSECTION Right 05/20/2021   Procedure: LYMPH NODE DISSECTION;  Surgeon: Melrose Nakayama, MD;  Location: [place];  Service: Thoracic;  Laterality: Right;   REVERSE SHOULDER ARTHROPLASTY Right 09/08/2020   Procedure: REVERSE TOTAL SHOULDER ARTHROPLASTY;  Surgeon: Hiram Gash, MD;  Location: [place];  Service: Orthopedics;  Laterality: Right;   screws and steel plate in neck  (D5-3)     10-12 years ago  x2    REVIEW OF SYSTEMS:  Constitutional: positive for fatigue Eyes: negative Ears, nose, mouth, throat, and face: negative Respiratory: positive for dyspnea on exertion Cardiovascular: negative Gastrointestinal: negative Genitourinary:negative Integument/breast: negative Hematologic/lymphatic: negative Musculoskeletal:positive for muscle  weakness Neurological: negative Behavioral/Psych: negative Endocrine: negative Allergic/Immunologic: negative   PHYSICAL EXAMINATION: General appearance: alert, cooperative, fatigued, and no distress Head: Normocephalic, without obvious abnormality, atraumatic Neck: no adenopathy, no JVD, supple, symmetrical, trachea midline, and thyroid not enlarged, symmetric, no tenderness/mass/nodules Lymph nodes: Cervical, supraclavicular, and axillary nodes normal. Resp: clear to auscultation bilaterally Back: symmetric, no curvature. ROM normal. No CVA tenderness. Cardio: regular rate and rhythm, S1, S2 normal, no murmur, click, rub or gallop GI: soft, non-tender; bowel sounds normal; no masses,  no organomegaly Extremities: extremities normal, atraumatic, no cyanosis or edema Neurologic: Alert and oriented X 3, normal strength and tone. Normal symmetric reflexes. Normal coordination and gait  ECOG PERFORMANCE STATUS: 1 - Symptomatic but completely ambulatory  Blood pressure 94/68, pulse 92, temperature (!) 97.3 F (36.3 C), temperature source Tympanic, resp. rate 18, height 6' 1"  (1.854 m), SpO2 94 %.  LABORATORY DATA: Lab Results  Component Value Date   WBC 10.1 08/04/2021   HGB 11.6 (  L) 08/04/2021   HCT 34.5 (L) 08/04/2021   MCV 82.9 08/04/2021   PLT 149 (L) 08/04/2021      Chemistry      Component Value Date/Time   NA 128 (L) 07/19/2021 0453   K 4.4 07/19/2021 0453   CL 94 (L) 07/19/2021 0453   CO2 25 07/19/2021 0453   BUN 23 07/19/2021 0453   CREATININE 0.70 07/19/2021 0453   CREATININE 0.65 06/21/2021 1332      Component Value Date/Time   CALCIUM 8.6 (L) 07/19/2021 0453   ALKPHOS 65 07/06/2021 0526   AST 28 07/06/2021 0526   AST 16 06/21/2021 1332   ALT 36 07/06/2021 0526   ALT 24 06/21/2021 1332   BILITOT 0.8 07/06/2021 0526   BILITOT 0.2 (L) 06/21/2021 1332       RADIOGRAPHIC STUDIES: CT HEAD WO CONTRAST (5MM)  Result Date: 07/06/2021 CLINICAL DATA:  Fall from  chair. Weakness of left extremity for 2 weeks EXAM: CT HEAD WITHOUT CONTRAST TECHNIQUE: Contiguous axial images were obtained from the base of the skull through the vertex without intravenous contrast. RADIATION DOSE REDUCTION: This exam was performed according to the departmental dose-optimization program which includes automated exposure control, adjustment of the mA and/or kV according to patient size and/or use of iterative reconstruction technique. COMPARISON:  Brain MRI from 3 days ago FINDINGS: Brain: Known presumed brain metastases in the anterior left frontal and right posterior frontal lobes with cavitary morphology and extensive vasogenic edema. Metastatic disease is underestimated compared to MRI. No hemorrhage, infarct, or hydrocephalus. No new finding. Vascular: No hyperdense vessel or unexpected calcification. Skull: No acute or aggressive finding Sinuses/Orbits: Bilateral cataract resection IMPRESSION: Known presumed brain metastases seen in the left anterior frontal and right posterior frontal lobes with extensive vasogenic edema. No change since brain MRI 3 days ago. Electronically Signed   By: Jorje Guild M.D.   On: 07/06/2021 07:42   MR BRAIN WO CONTRAST  Result Date: 07/06/2021 CLINICAL DATA:  Progressive lower extremity weakness, known brain Mets from squamous cell lung cancer EXAM: MRI HEAD WITHOUT CONTRAST TECHNIQUE: Multiplanar, multiecho pulse sequences of the brain and surrounding structures were obtained without intravenous contrast. COMPARISON:  Same-day noncontrast head CT, MR head 07/03/2021 FINDINGS: Brain: There is no evidence of acute intracranial hemorrhage, extra-axial fluid collection, or acute infarct. Again seen are metastatic lesions in the left frontal lobe centered at the middle frontal gyrus and right precentral gyrus, unchanged in size compared to the recent study from 07/03/2021 measured on the noncontrast T1 sequence. SWI signal dropout within the right frontal  lesion is unchanged, consistent with blood products. Perilesional edema in the left frontal lobe and right frontal and parietal lobes extending inferiorly to the right corona radiata is unchanged. There is partial effacement of the right lateral ventricle but no midline shift, unchanged. The additional 3 mm enhancing lesion seen in the left precentral gyrus is not seen on the current study in the absence of intravenous contrast. Vascular: Normal flow voids. Skull and upper cervical spine: Normal marrow signal. Upper cervical spine fusion hardware is partially imaged. Sinuses/Orbits: The paranasal sinuses are clear. Bilateral lens implants are in place. The globes and orbits are otherwise unremarkable. Other: There is a partially imaged T2 hyperintense lesion in the left neck just inferolateral to the parotid gland. There is associated diffusion restriction (13-47). This lesion was present on the PET-CT from 04/04/2021 and was not hypermetabolic. IMPRESSION: 1. Unchanged size and noncontrast appearance of the metastatic lesions in  the bilateral frontal lobes as above with unchanged extensive perilesional edema but no midline shift. 2. No evidence of acute intracranial hemorrhage or infarct. 3. Partially imaged cystic lesion in the left neck inferolateral to the parotid gland was present on the prior PET-CT from 04/04/2021 and was not hypermetabolic on that study. This may reflect a sebaceous cyst; however, a necrotic lymph node can not be entirely excluded. Recommend attention on follow-up PET-CTs. Electronically Signed   By: Valetta Mole M.D.   On: 07/06/2021 13:55   MR BRAIN W WO CONTRAST  Addendum Date: 07/11/2021   ADDENDUM REPORT: 07/11/2021 08:02 ADDENDUM: Original report by Dr. Collins Scotland. Addendum by Dr. Jeralyn Ruths on 07/11/2021 following review in multidisciplinary oncology conference: A third enhancing lesion measures 2 mm in the left frontoparietal region (series 1100, image 208) and is unchanged from  07/03/2021. Electronically Signed   By: Logan Bores M.D.   On: 07/11/2021 08:02   Result Date: 07/11/2021 CLINICAL DATA:  Brain/CNS neoplasm, staging. EXAM: MRI HEAD WITHOUT AND WITH CONTRAST TECHNIQUE: Multiplanar, multiecho pulse sequences of the brain and surrounding structures were obtained without and with intravenous contrast. CONTRAST:  43m GADAVIST GADOBUTROL 1 MMOL/ML IV SOLN COMPARISON:  07/06/2021 FINDINGS: Brain: Unchanged appearance of predominantly cystic lesions of the frontal lobes with severe surrounding edema. There is no internal diffusion restriction. Small amount of associated chronic blood products without acute hemorrhage. There is peripheral nodular contrast enhancement within both lesions, more notable on the right. There is no herniation. There are no other lesions. Vascular: Major flow voids are preserved. Skull and upper cervical spine: Normal calvarium and skull base. Visualized upper cervical spine and soft tissues are normal. Sinuses/Orbits:No paranasal sinus fluid levels or advanced mucosal thickening. No mastoid or middle ear effusion. Normal orbits. IMPRESSION: Peripheral enhancement and nodularity of the 2 cystic lesions of the frontal lobes, which remain consistent with metastatic disease. No other lesions. Electronically Signed: By: KUlyses JarredM.D. On: 07/07/2021 23:08   EEG adult  Result Date: 07/08/2021 YLora Havens MD     07/08/2021  8:38 AM Patient Name: JZEPH RIEBELMRN: [id number] Attending: PLora HavensReferring Physician/Provider: DEdwin Dada MD Date: 1/26/223 Duration: 21.51 mins Patient history: 66year old male with metastatic squamous lung cell carcinoma with mets to brain presented with acute left-sided weakness.  EEG to evaluate for seizure. Level of alertness: Awake, asleep AEDs during EEG study: None Technical aspects: This EEG study was done with scalp electrodes positioned according to the 10-20 International system  of electrode placement. Electrical activity was acquired at a sampling rate of 500Hz  and reviewed with a high frequency filter of 70Hz  and a low frequency filter of 1Hz . EEG data were recorded continuously and digitally stored. Description: The posterior dominant rhythm consists of 8Hz  activity of moderate voltage (25-35 uV) seen predominantly in posterior head regions, symmetric and reactive to eye opening and eye closing. Sleep was characterized by vertex waves, sleep spindles (12 to 14 Hz), maximal frontocentral region. Single sharp transient was noted in right frontal temporal region. Hyperventilation and photic stimulation were not performed.   IMPRESSION: This study is within normal limits. No seizures or definite epileptiform discharges were seen throughout the recording. Priyanka OBarbra Sarks   ASSESSMENT AND PLAN: This is a very pleasant 66years old white male diagnosed with Stage IV (T2 a, N0, Mb)) non-small cell lung cancer, squamous cell carcinoma presented initially as a stage Ib (T2 a, N0, M0) squamous cell carcinoma with keratinization  presented with superior segment right lower lobe lung nodule in December 2022 and the patient was found to have metastatic disease to the brain in January 2023.  His PET scan at that time showed also suspicious skin lesion in the gluteal area and scrotum. The patient is status post right lower lobectomy with lymph node dissection under the care of Dr. Roxan Hockey on May 20, 2021 in addition to [facility] to 3 brain metastasis under the care of Dr. Lisbeth Renshaw completed July 19, 2021. PD-L1 expression is 3%. I had a lengthy discussion with the patient today about his current disease stage, prognosis and treatment options. I explained to the patient that he now has incurable condition and all the treatment will be of palliative nature. I discussed with the patient the option of palliative care and hospice referral versus palliative systemic chemotherapy with carboplatin for  AUC of 5, paclitaxel 175 Mg/M2 and Libtayo (Cempilimab) 350 Mg IV every 3 weeks. The patient is interested in treatment. I discussed with him the adverse effect of this treatment including but not limited to alopecia, myelosuppression, nausea and vomiting, peripheral neuropathy, liver or renal dysfunction as well as immunotherapy adverse effects. I will give him more time to recover from the recent treatment of the brain metastasis and the weakness. We expect to start the first cycle of his treatment on August 18, 2021. I will see him back for follow-up visit at that time. I will arrange for the patient to have a chemotherapy education class before the first dose of his treatment. I will call his pharmacy with prescription for Compazine 10 mg p.o. every 6 hours as needed for nausea. The patient was advised to call immediately if he has any other concerning symptoms in the interval. If he changes his mind regarding the treatment, he will call the office to cancel his appointments and we will monitor him with regular scan every few months. The patient voices understanding of current disease status and treatment options and is in agreement with the current care plan.  All questions were answered. The patient knows to call the clinic with any problems, questions or concerns. We can certainly see the patient much sooner if necessary.  The total time spent in the appointment was 55 minutes.  Disclaimer: This note was dictated with voice recognition software. Similar sounding words can inadvertently be transcribed and may not be corrected upon review.

## 2021-08-04 NOTE — Progress Notes (Signed)
START OFF PATHWAY REGIMEN - Non-Small Cell Lung   [id number]:Cemiplimab 350 mg IV D1 + Carboplatin AUC=6 IV D1 + Paclitaxel 200 mg/m2 IV D1 q21 Days x 4 Cycles:   A cycle is every 21 days:     Paclitaxel      Carboplatin      Cemiplimab-rwlc   **Always confirm dose/schedule in your pharmacy ordering system**  Patient Characteristics: Stage IV Metastatic, Squamous, Molecular Analysis Not Elected, PS = 0, 1, Initial Chemotherapy/Immunotherapy, Immunotherapy Candidate, PD-L1 Expression Positive 1-49% (TPS) / Negative / Not Tested / Awaiting Test Results Therapeutic Status: Stage IV Metastatic Histology: Squamous Cell ECOG Performance Status: 1 Chemotherapy/Immunotherapy Line of Therapy: Initial Chemotherapy/Immunotherapy Immunotherapy Candidate Status: Candidate for Immunotherapy PD-L1 Expression Status: PD-L1 Positive 1-49% (TPS) Intent of Therapy: Non-Curative / Palliative Intent, Discussed with Patient

## 2021-08-04 NOTE — Progress Notes (Signed)
[facility] [facility] [place], [place] [address] [phone]   New Patient Evaluation  Date of Service: 08/04/21 Patient Name: Stephen Diaz Patient MRN: [id number] Patient DOB: 03/12/1956 Provider: Ventura Sellers, MD  Identifying Statement:  Stephen Diaz is a 66 y.o. male with Metastasis to brain [facility]) who presents for initial consultation and evaluation regarding cancer associated neurologic deficits.    Referring Provider: [name], PA-C [address] [place],  [place] [address]  Primary Cancer:  Oncologic History: Oncology History  Squamous cell carcinoma of lung, stage I, right ([place])  06/21/2021 Initial Diagnosis   Squamous cell carcinoma of lung, stage I, right ([place])   06/21/2021 Cancer Staging   Staging form: Lung, AJCC 8th Edition - Clinical: Stage IB (cT2a, cN0, cM0) - Signed by Curt Bears, MD on 06/21/2021     CNS Oncologic History 07/19/21: Completes 3 frx SRS to 2/3 treated metastases [facility])  History of Present Illness: The patient's records from the referring physician were obtained and reviewed and the patient interviewed to confirm this HPI.  Stephen Diaz presents to clinic today, now having completed radiosurgery for his brain metastases from lung cancer.  He continues to experience weakness and functional limitation in the left arm and leg.  At rehab facility, he is walking some with a cane, having made some improvements in recent days.  He remains with minimal practical use of his left hand, cannot zip his jacket or tie his shoes.  He otherwise denies confusion episodes, headaches, or seizures.  According to patient he has never had a seizure, though remains on Keppra 554m twice per day.  Decadron dose remains at 4106mthree times per day.  He may be going home in a week or so, lives alone but has helpful and involved neighbors.  Medications: Current Outpatient Medications on File Prior to Visit   Medication Sig Dispense Refill   albuterol (VENTOLIN HFA) 108 (90 Base) MCG/ACT inhaler Inhale 2 puffs into the lungs every 6 (six) hours as needed for wheezing or shortness of breath.     aspirin 81 MG EC tablet Take 81 mg by mouth daily.     dexamethasone (DECADRON) 4 MG tablet Take 1 tablet (4 mg total) by mouth 3 (three) times daily. 90 tablet 2   Fluticasone-Umeclidin-Vilant (TRELEGY ELLIPTA) 100-62.5-25 MCG/ACT AEPB Inhale 1 puff into the lungs daily. 1 each 11   Fluticasone-Umeclidin-Vilant (TRELEGY ELLIPTA) 100-62.5-25 MCG/ACT AEPB Inhale 1 puff into the lungs daily. 14 each 0   insulin lispro (HUMALOG) 200 UNIT/ML KwikPen Inject 2-10 Units into the skin 3 (three) times daily before meals. Take 2 units for glucose 150-200, 4 units for glucose 201-250, 6 units for glucose 251-300, 8 units for glucose 201-350 and 10 units for glucose 351-400. 9 mL 2   Insulin Pen Needle (PEN NEEDLES 3/16") 31G X 5 MM MISC Use as directed with insulin pen 100 each 2   levETIRAcetam (KEPPRA) 500 MG tablet Take 1 tablet (500 mg total) by mouth 2 (two) times daily. 60 tablet 0   lisinopril (ZESTRIL) 5 MG tablet Take 1 tablet (5 mg total) by mouth daily. 30 tablet 1   metFORMIN (GLUCOPHAGE) 500 MG tablet Take 1,000 mg by mouth daily with breakfast.     Multiple Vitamins-Minerals (CENTRUM SILVER 50+MEN) TABS Take 1 tablet by mouth daily.     oxyCODONE (OXY IR/ROXICODONE) 5 MG immediate release tablet Take 1 tablet (5 mg total)  by mouth every 4 (four) hours as needed for moderate pain. 10 tablet 0   sodium chloride 1 g tablet Take 1 tablet (1 g total) by mouth 3 (three) times daily with meals. 60 tablet 0   tamsulosin (FLOMAX) 0.4 MG CAPS capsule Take 1 capsule (0.4 mg total) by mouth daily after breakfast. 30 capsule 2   traZODone (DESYREL) 50 MG tablet Take 50 mg by mouth at bedtime.     No current facility-administered medications on file prior to visit.    Allergies: No Known Allergies Past Medical History:   Past Medical History:  Diagnosis Date   Arthritis    right shoulder   Cancer (HCC)    COPD (chronic obstructive pulmonary disease) (HCC)    Diabetes mellitus without complication (HCC)    Type II   Hypertension    Right foot drop    from pinched nerve in back, wears brace   Past Surgical History:  Past Surgical History:  Procedure Laterality Date   CATARACT EXTRACTION Left    CATARACT EXTRACTION W/PHACO Right 08/13/2020   Procedure: CATARACT EXTRACTION PHACO AND INTRAOCULAR LENS PLACEMENT ([place]);  Surgeon: Baruch Goldmann, MD;  Location: [facility];  Service: Ophthalmology;  Laterality: Right;  CDE: 8.24   COLONOSCOPY  12/2020   Dr. Adelina Mings (general surgeon): diverticulsos and left sided colitis with erythema in the sigmoid and descending colon. bx: focal mild acute colitis ?infection vs ischemia   COLONOSCOPY  10/2009   Dr. Carlyon Prows [place]: diverticulosis, two tubular adenomas removed. descending colon erythema and mild ulcerations noted with benign biopsies.   EYE SURGERY     HERNIA REPAIR     INTERCOSTAL NERVE BLOCK Right 05/20/2021   Procedure: INTERCOSTAL NERVE BLOCK;  Surgeon: Melrose Nakayama, MD;  Location: [facility];  Service: Thoracic;  Laterality: Right;   LUNG REMOVAL, PARTIAL Right    LYMPH NODE DISSECTION Right 05/20/2021   Procedure: LYMPH NODE DISSECTION;  Surgeon: Melrose Nakayama, MD;  Location: [facility];  Service: Thoracic;  Laterality: Right;   REVERSE SHOULDER ARTHROPLASTY Right 09/08/2020   Procedure: REVERSE TOTAL SHOULDER ARTHROPLASTY;  Surgeon: Hiram Gash, MD;  Location: [facility];  Service: Orthopedics;  Laterality: Right;   screws and steel plate in neck  (B1-4)     10-12 years ago  x2   Social History:  Social History   Socioeconomic History   Marital status: Widowed    Spouse name: Not on file   Number of children: Not on file   Years of education: Not on file   Highest education level: Not on file  Occupational History    Not on file  Tobacco Use   Smoking status: Former    Packs/day: 1.00    Years: 49.00    Pack years: 49.00    Types: Cigarettes    Start date: 53    Quit date: 05/2021    Years since quitting: 0.2   Smokeless tobacco: Never  Vaping Use   Vaping Use: Never used  Substance and Sexual Activity   Alcohol use: Not Currently   Drug use: Not Currently   Sexual activity: Not Currently  Other Topics Concern   Not on file  Social History Narrative   Not on file   Social Determinants of Health   Financial Resource Strain: Not on file  Food Insecurity: Not on file  Transportation Needs: Not on file  Physical Activity: Not on file  Stress: Not on file  Social Connections: Not  on file  Intimate Partner Violence: Not on file   Family History:  Family History  Problem Relation Age of Onset   Crohn's disease Cousin    Colon cancer Neg Hx     Review of Systems: Constitutional: Doesn't report fevers, chills or abnormal weight loss Eyes: Doesn't report blurriness of vision Ears, nose, mouth, throat, and face: Doesn't report sore throat Respiratory: Doesn't report cough, dyspnea or wheezes Cardiovascular: Doesn't report palpitation, chest discomfort  Gastrointestinal:  Doesn't report nausea, constipation, diarrhea GU: Doesn't report incontinence Skin: Doesn't report skin rashes Neurological: Per HPI Musculoskeletal: Doesn't report joint pain Behavioral/Psych: Doesn't report anxiety  Physical Exam: Wt Readings from Last 3 Encounters:  07/27/21 178 lb 12.8 oz (81.1 kg)  07/06/21 179 lb 3.7 oz (81.3 kg)  07/05/21 184 lb 8.4 oz (83.7 kg)   Temp Readings from Last 3 Encounters:  08/04/21 (!) 97.3 F (36.3 C) (Tympanic)  07/19/21 97.8 F (36.6 C) (Oral)  07/05/21 97.8 F (36.6 C) (Oral)   BP Readings from Last 3 Encounters:  08/04/21 94/68  07/27/21 112/60  07/19/21 114/76   Pulse Readings from Last 3 Encounters:  08/04/21 92  07/27/21 89  07/19/21 78    KPS:  70. General: Alert, cooperative, pleasant, in no acute distress Head: Normal EENT: No conjunctival injection or scleral icterus.  Lungs: Resp effort normal Cardiac: Regular rate Abdomen: Non-distended abdomen Skin: No rashes cyanosis or petechiae. Extremities: No clubbing or edema  Neurologic Exam: Mental Status: Awake, alert, attentive to examiner. Oriented to self and environment. Language is fluent with intact comprehension.  Cranial Nerves: Visual acuity is grossly normal. Visual fields are full. Extra-ocular movements intact. No ptosis. Face is symmetric Motor: Tone and bulk are normal. Power is 4/5 in left arm and leg, impaired fine motor function in left hand. Reflexes are symmetric, no pathologic reflexes present.  Sensory: Intact to light touch Gait: Hemiparetic   Labs: I have reviewed the data as listed    Component Value Date/Time   NA 130 (L) 08/04/2021 0825   K 4.3 08/04/2021 0825   CL 93 (L) 08/04/2021 0825   CO2 27 08/04/2021 0825   GLUCOSE 316 (H) 08/04/2021 0825   BUN 14 08/04/2021 0825   CREATININE 0.47 (L) 08/04/2021 0825   CALCIUM 8.5 (L) 08/04/2021 0825   PROT 5.9 (L) 08/04/2021 0825   ALBUMIN 2.9 (L) 08/04/2021 0825   AST 14 (L) 08/04/2021 0825   ALT 29 08/04/2021 0825   ALKPHOS 76 08/04/2021 0825   BILITOT 0.3 08/04/2021 0825   GFRNONAA >60 08/04/2021 0825   GFRAA >60 12/28/2010 0946   Lab Results  Component Value Date   WBC 10.1 08/04/2021   NEUTROABS 8.0 (H) 08/04/2021   HGB 11.6 (L) 08/04/2021   HCT 34.5 (L) 08/04/2021   MCV 82.9 08/04/2021   PLT 149 (L) 08/04/2021    Imaging:  CT HEAD WO CONTRAST (5MM)  Result Date: 07/06/2021 CLINICAL DATA:  Fall from chair. Weakness of left extremity for 2 weeks EXAM: CT HEAD WITHOUT CONTRAST TECHNIQUE: Contiguous axial images were obtained from the base of the skull through the vertex without intravenous contrast. RADIATION DOSE REDUCTION: This exam was performed according to the departmental  dose-optimization program which includes automated exposure control, adjustment of the mA and/or kV according to patient size and/or use of iterative reconstruction technique. COMPARISON:  Brain MRI from 3 days ago FINDINGS: Brain: Known presumed brain metastases in the anterior left frontal and right posterior frontal lobes with cavitary  morphology and extensive vasogenic edema. Metastatic disease is underestimated compared to MRI. No hemorrhage, infarct, or hydrocephalus. No new finding. Vascular: No hyperdense vessel or unexpected calcification. Skull: No acute or aggressive finding Sinuses/Orbits: Bilateral cataract resection IMPRESSION: Known presumed brain metastases seen in the left anterior frontal and right posterior frontal lobes with extensive vasogenic edema. No change since brain MRI 3 days ago. Electronically Signed   By: Jorje Guild M.D.   On: 07/06/2021 07:42   MR BRAIN WO CONTRAST  Result Date: 07/06/2021 CLINICAL DATA:  Progressive lower extremity weakness, known brain Mets from squamous cell lung cancer EXAM: MRI HEAD WITHOUT CONTRAST TECHNIQUE: Multiplanar, multiecho pulse sequences of the brain and surrounding structures were obtained without intravenous contrast. COMPARISON:  Same-day noncontrast head CT, MR head 07/03/2021 FINDINGS: Brain: There is no evidence of acute intracranial hemorrhage, extra-axial fluid collection, or acute infarct. Again seen are metastatic lesions in the left frontal lobe centered at the middle frontal gyrus and right precentral gyrus, unchanged in size compared to the recent study from 07/03/2021 measured on the noncontrast T1 sequence. SWI signal dropout within the right frontal lesion is unchanged, consistent with blood products. Perilesional edema in the left frontal lobe and right frontal and parietal lobes extending inferiorly to the right corona radiata is unchanged. There is partial effacement of the right lateral ventricle but no midline shift,  unchanged. The additional 3 mm enhancing lesion seen in the left precentral gyrus is not seen on the current study in the absence of intravenous contrast. Vascular: Normal flow voids. Skull and upper cervical spine: Normal marrow signal. Upper cervical spine fusion hardware is partially imaged. Sinuses/Orbits: The paranasal sinuses are clear. Bilateral lens implants are in place. The globes and orbits are otherwise unremarkable. Other: There is a partially imaged T2 hyperintense lesion in the left neck just inferolateral to the parotid gland. There is associated diffusion restriction (13-47). This lesion was present on the PET-CT from 04/04/2021 and was not hypermetabolic. IMPRESSION: 1. Unchanged size and noncontrast appearance of the metastatic lesions in the bilateral frontal lobes as above with unchanged extensive perilesional edema but no midline shift. 2. No evidence of acute intracranial hemorrhage or infarct. 3. Partially imaged cystic lesion in the left neck inferolateral to the parotid gland was present on the prior PET-CT from 04/04/2021 and was not hypermetabolic on that study. This may reflect a sebaceous cyst; however, a necrotic lymph node can not be entirely excluded. Recommend attention on follow-up PET-CTs. Electronically Signed   By: Valetta Mole M.D.   On: 07/06/2021 13:55   MR BRAIN W WO CONTRAST  Addendum Date: 07/11/2021   ADDENDUM REPORT: 07/11/2021 08:02 ADDENDUM: Original report by Dr. Collins Scotland. Addendum by Dr. Jeralyn Ruths on 07/11/2021 following review in multidisciplinary oncology conference: A third enhancing lesion measures 2 mm in the left frontoparietal region (series 1100, image 208) and is unchanged from 07/03/2021. Electronically Signed   By: Logan Bores M.D.   On: 07/11/2021 08:02   Result Date: 07/11/2021 CLINICAL DATA:  Brain/CNS neoplasm, staging. EXAM: MRI HEAD WITHOUT AND WITH CONTRAST TECHNIQUE: Multiplanar, multiecho pulse sequences of the brain and surrounding structures  were obtained without and with intravenous contrast. CONTRAST:  27m GADAVIST GADOBUTROL 1 MMOL/ML IV SOLN COMPARISON:  07/06/2021 FINDINGS: Brain: Unchanged appearance of predominantly cystic lesions of the frontal lobes with severe surrounding edema. There is no internal diffusion restriction. Small amount of associated chronic blood products without acute hemorrhage. There is peripheral nodular contrast enhancement within both lesions, more  notable on the right. There is no herniation. There are no other lesions. Vascular: Major flow voids are preserved. Skull and upper cervical spine: Normal calvarium and skull base. Visualized upper cervical spine and soft tissues are normal. Sinuses/Orbits:No paranasal sinus fluid levels or advanced mucosal thickening. No mastoid or middle ear effusion. Normal orbits. IMPRESSION: Peripheral enhancement and nodularity of the 2 cystic lesions of the frontal lobes, which remain consistent with metastatic disease. No other lesions. Electronically Signed: By: Ulyses Jarred M.D. On: 07/07/2021 23:08   EEG adult  Result Date: 07/08/2021 Lora Havens, MD     07/08/2021  8:38 AM Patient Name: Stephen Diaz MRN: [id number] Epilepsy Attending: Lora Havens Referring Physician/Provider: Edwin Dada, MD Date: 1/26/223 Duration: 21.51 mins Patient history: 66 year old male with metastatic squamous lung cell carcinoma with mets to brain presented with acute left-sided weakness.  EEG to evaluate for seizure. Level of alertness: Awake, asleep AEDs during EEG study: None Technical aspects: This EEG study was done with scalp electrodes positioned according to the 10-20 International system of electrode placement. Electrical activity was acquired at a sampling rate of _0  and reviewed with a high frequency filter of _1  and a low frequency filter of _2 . EEG data were recorded continuously and digitally stored. Description: The posterior dominant rhythm consists of  _3  activity of moderate voltage (25-35 uV) seen predominantly in posterior head regions, symmetric and reactive to eye opening and eye closing. Sleep was characterized by vertex waves, sleep spindles (12 to 14 Hz), maximal frontocentral region. Single sharp transient was noted in right frontal temporal region. Hyperventilation and photic stimulation were not performed.   IMPRESSION: This study is within normal limits. No seizures or definite epileptiform discharges were seen throughout the recording. Priyanka Barbra Sarks     Assessment/Plan Metastasis to brain [facility])  Stephen Diaz is clinically stable today, now having complete radiosurgery for 3 brain metastases secondary to squamous cell lung cancer.  Presents with notable motor deficits secondary to R frontoparietal lesion.  We recommended tapering decadron; 65m BID x5 days, then 436mdaily x5 days, then 72m53maily x5 days, then STOP if tolerated.  Keppra should be discontinued given absence of seizures.  Counseled patient that he remains at risk for seizures, however.  Encouraged continuing aggressive work with physical and occupational therapy to regain function.  This should continue at home once discharged.  He met with Dr. MohJulien Nordmannday, systemic therapy plans were discussed.  We spent twenty additional minutes teaching regarding the natural history, biology, and historical experience in the treatment of neurologic complications of cancer.   We appreciate the opportunity to participate in the care of Stephen Diaz We ask that Stephen Shaggyturn to clinic in 3 months following next brain MRI, or sooner as needed.  All questions were answered. The patient knows to call the clinic with any problems, questions or concerns. No barriers to learning were detected.  The total time spent in the encounter was 40 minutes and more than 50% was on counseling and review of test results   ZacVentura SellersD Medical Director of  Neuro-Oncology [facility] [place]/23/23 9:09 AM

## 2021-08-05 ENCOUNTER — Telehealth: Payer: Self-pay | Admitting: Internal Medicine

## 2021-08-05 ENCOUNTER — Telehealth: Payer: Self-pay | Admitting: Nurse Practitioner

## 2021-08-05 NOTE — Telephone Encounter (Signed)
Scheduled per 2/23 los, pt has been called and confirmed

## 2021-08-05 NOTE — Telephone Encounter (Signed)
Scheduled per 02/23 los, patient has been called and voicemail was left.

## 2021-08-05 NOTE — Telephone Encounter (Signed)
Scheduled per 2/24 secure chat, message has been left with pt

## 2021-08-10 ENCOUNTER — Telehealth: Payer: Self-pay

## 2021-08-10 ENCOUNTER — Encounter: Payer: Self-pay | Admitting: *Deleted

## 2021-08-10 ENCOUNTER — Inpatient Hospital Stay: Payer: Medicare Other

## 2021-08-10 ENCOUNTER — Telehealth: Payer: Self-pay | Admitting: *Deleted

## 2021-08-10 ENCOUNTER — Inpatient Hospital Stay: Payer: Medicare Other | Admitting: Nurse Practitioner

## 2021-08-10 NOTE — Telephone Encounter (Signed)
Attempted to reach Stephen Diaz via his cell phone and nursing facility, as he missed his chemo education and palliative care appt. He did not answer his phone and the nurse was unavailable. Will plan to reschedule at a later date and time.  ?

## 2021-08-10 NOTE — Telephone Encounter (Signed)
Tried several times to reach pt regarding education appt today.  Messages left to return call & also left message on daughter's ph to call back.  [facility] & was informed that pt died 08-25-2021.  Informed Dr Cam Hai.   ?

## 2021-08-10 NOTE — Progress Notes (Signed)
Oncology Nurse Navigator Documentation ? ?Oncology Nurse Navigator Flowsheets 08/10/2021 08/01/2021 07/20/2021 07/19/2021 07/19/2021 07/05/2021 07/04/2021  ?Abnormal Finding Date - 04/22/2021 - - - - -  ?Confirmed Diagnosis Date - 05/20/2021 - - - - -  ?Diagnosis Status - - - - - - -  ?Planned Course of Treatment - Radiation - - - - -  ?Phase of Treatment - Radiation - - - - -  ?Radiation Actual Start Date: - 07/08/2021 - - - - -  ?Radiation Actual End Date: - 07/19/2021 - - - - -  ?Surgery Actual Start Date: - - - - - - -  ?Navigator Follow Up Date: - 08/04/2021 - - 08/02/2021 07/19/2021 07/05/2021  ?Navigator Follow Up Reason: - Follow-up Appointment - - Follow-up Appointment Follow-up Appointment Pathology  ?Navigation Complete Date: 08/10/2021 - - - - - -  ?Post Navigation: Continue to Follow Patient? No - - - - - -  ?Reason Not Navigating Patient: Hospice/Death - - - - - -  ?Navigator Location [facility]  ?Referral Date to RadOnc/MedOnc - - - - - - -  ?Navigator Encounter Type Other:/I learned from [facility] RN patient passed away. I updated thoracic team.  Telephone Telephone Inpatient Inpatient Inpatient Inpatient  ?Telephone - Outgoing Call [facility] Call - - - -  ?Treatment Initiated Date - 05/20/2021 - - - - -  ?Patient Visit Type Other Other Other Inpatient - - -  ?Treatment Phase - Pre-Tx/Tx Discussion - - - - -  ?Barriers/Navigation Needs - Coordination of Care Coordination of Care Coordination of Care Coordination of Care Coordination of Care Coordination of Care  ?Education - - - - - - -  ?Interventions - Coordination of Care Coordination of Care Coordination of Care Coordination of Care Coordination of Care Coordination of Care  ?Acuity Level 2-Minimal Needs (1-2 Barriers Identified) Level 2-Minimal Needs (1-2 Barriers Identified) Level 2-Minimal Needs (1-2 Barriers Identified) Level 3-Moderate Needs (3-4 Barriers  Identified) Level 2-Minimal Needs (1-2 Barriers Identified) Level 2-Minimal Needs (1-2 Barriers Identified) Level 2-Minimal Needs (1-2 Barriers Identified)  ?Coordination of Care - Other Other Other Appts Pathology Other  ?Education Method - - - - - - -  ?Time Spent with Patient 30 15 15 30 30 30 30   ?  ?

## 2021-08-10 DEATH — deceased

## 2021-08-11 ENCOUNTER — Other Ambulatory Visit: Payer: Medicare Other

## 2021-08-15 ENCOUNTER — Ambulatory Visit: Payer: Medicare Other

## 2021-08-18 ENCOUNTER — Other Ambulatory Visit: Payer: Medicare Other

## 2021-08-18 ENCOUNTER — Ambulatory Visit: Payer: Medicare Other

## 2021-08-18 ENCOUNTER — Ambulatory Visit: Payer: Medicare Other | Admitting: Internal Medicine

## 2021-08-20 ENCOUNTER — Ambulatory Visit: Payer: Medicare Other

## 2021-08-25 ENCOUNTER — Other Ambulatory Visit: Payer: Medicare Other

## 2021-09-01 ENCOUNTER — Inpatient Hospital Stay: Payer: Medicare Other

## 2021-09-08 ENCOUNTER — Other Ambulatory Visit: Payer: Medicare Other

## 2021-09-08 ENCOUNTER — Ambulatory Visit: Payer: Medicare Other

## 2021-09-08 ENCOUNTER — Ambulatory Visit: Payer: Medicare Other | Admitting: Physician Assistant

## 2021-09-10 ENCOUNTER — Ambulatory Visit: Payer: Medicare Other

## 2021-09-15 ENCOUNTER — Other Ambulatory Visit: Payer: Medicare Other

## 2021-09-22 ENCOUNTER — Other Ambulatory Visit: Payer: Medicare Other

## 2021-09-29 ENCOUNTER — Other Ambulatory Visit: Payer: Medicare Other

## 2021-09-29 ENCOUNTER — Ambulatory Visit: Payer: Medicare Other | Admitting: Internal Medicine

## 2021-09-29 ENCOUNTER — Ambulatory Visit: Payer: Medicare Other

## 2021-10-01 ENCOUNTER — Ambulatory Visit: Payer: Medicare Other

## 2021-10-06 ENCOUNTER — Other Ambulatory Visit: Payer: Medicare Other

## 2021-10-17 ENCOUNTER — Ambulatory Visit: Payer: Medicare Other | Admitting: Internal Medicine

## 2021-12-19 ENCOUNTER — Other Ambulatory Visit: Payer: Medicare Other

## 2021-12-19 ENCOUNTER — Ambulatory Visit: Payer: Medicare Other | Admitting: Internal Medicine

## 2021-12-21 ENCOUNTER — Ambulatory Visit: Payer: Medicare Other | Admitting: Internal Medicine

## 2022-01-11 ENCOUNTER — Ambulatory Visit: Payer: Medicare Other | Admitting: Dermatology
